# Patient Record
Sex: Female | Born: 1935 | Race: White | Hispanic: No | Smoking: Former smoker
Health system: Southern US, Community
[De-identification: ages and names within clinical notes are randomized; demographics above are authoritative.]

## PROBLEM LIST (undated history)

## (undated) DIAGNOSIS — I429 Cardiomyopathy, unspecified: Secondary | ICD-10-CM

## (undated) DIAGNOSIS — I10 Essential (primary) hypertension: Secondary | ICD-10-CM

## (undated) DIAGNOSIS — C50919 Malignant neoplasm of unspecified site of unspecified female breast: Secondary | ICD-10-CM

## (undated) DIAGNOSIS — I219 Acute myocardial infarction, unspecified: Secondary | ICD-10-CM

## (undated) DIAGNOSIS — F419 Anxiety disorder, unspecified: Secondary | ICD-10-CM

## (undated) DIAGNOSIS — E7800 Pure hypercholesterolemia, unspecified: Secondary | ICD-10-CM

## (undated) DIAGNOSIS — E785 Hyperlipidemia, unspecified: Secondary | ICD-10-CM

## (undated) DIAGNOSIS — Z800 Family history of malignant neoplasm of digestive organs: Secondary | ICD-10-CM

## (undated) DIAGNOSIS — I2510 Atherosclerotic heart disease of native coronary artery without angina pectoris: Secondary | ICD-10-CM

## (undated) DIAGNOSIS — M1990 Unspecified osteoarthritis, unspecified site: Secondary | ICD-10-CM

## (undated) DIAGNOSIS — Z8041 Family history of malignant neoplasm of ovary: Secondary | ICD-10-CM

## (undated) DIAGNOSIS — C4490 Unspecified malignant neoplasm of skin, unspecified: Secondary | ICD-10-CM

## (undated) DIAGNOSIS — C801 Malignant (primary) neoplasm, unspecified: Secondary | ICD-10-CM

## (undated) HISTORY — DX: Cardiomyopathy, unspecified: I42.9

## (undated) HISTORY — DX: Malignant neoplasm of unspecified site of unspecified female breast: C50.919

## (undated) HISTORY — DX: Acute myocardial infarction, unspecified: I21.9

## (undated) HISTORY — PX: MASTECTOMY: SHX3

## (undated) HISTORY — DX: Atherosclerotic heart disease of native coronary artery without angina pectoris: I25.10

## (undated) HISTORY — PX: ABDOMINAL HYSTERECTOMY: SHX81

## (undated) HISTORY — DX: Unspecified malignant neoplasm of skin, unspecified: C44.90

## (undated) HISTORY — DX: Family history of malignant neoplasm of digestive organs: Z80.0

## (undated) HISTORY — DX: Family history of malignant neoplasm of ovary: Z80.41

---

## 2000-03-01 ENCOUNTER — Encounter: Payer: Self-pay | Admitting: Hematology & Oncology

## 2000-03-01 ENCOUNTER — Encounter: Admission: RE | Admit: 2000-03-01 | Discharge: 2000-03-01 | Payer: Self-pay | Admitting: Hematology & Oncology

## 2000-03-10 ENCOUNTER — Encounter: Payer: Self-pay | Admitting: Hematology & Oncology

## 2000-03-10 ENCOUNTER — Ambulatory Visit (HOSPITAL_COMMUNITY): Admission: RE | Admit: 2000-03-10 | Discharge: 2000-03-10 | Payer: Self-pay | Admitting: Hematology & Oncology

## 2000-05-24 ENCOUNTER — Encounter: Payer: Self-pay | Admitting: Family Medicine

## 2000-05-24 ENCOUNTER — Ambulatory Visit (HOSPITAL_COMMUNITY): Admission: RE | Admit: 2000-05-24 | Discharge: 2000-05-24 | Payer: Self-pay | Admitting: Family Medicine

## 2001-05-28 ENCOUNTER — Ambulatory Visit (HOSPITAL_COMMUNITY): Admission: RE | Admit: 2001-05-28 | Discharge: 2001-05-28 | Payer: Self-pay | Admitting: Family Medicine

## 2001-05-28 ENCOUNTER — Encounter: Payer: Self-pay | Admitting: Family Medicine

## 2002-05-29 ENCOUNTER — Ambulatory Visit (HOSPITAL_COMMUNITY): Admission: RE | Admit: 2002-05-29 | Discharge: 2002-05-29 | Payer: Self-pay | Admitting: Family Medicine

## 2002-05-29 ENCOUNTER — Encounter: Payer: Self-pay | Admitting: Family Medicine

## 2002-12-18 ENCOUNTER — Observation Stay (HOSPITAL_COMMUNITY): Admission: EM | Admit: 2002-12-18 | Discharge: 2002-12-19 | Payer: Self-pay | Admitting: Emergency Medicine

## 2003-06-30 ENCOUNTER — Ambulatory Visit (HOSPITAL_COMMUNITY): Admission: RE | Admit: 2003-06-30 | Discharge: 2003-06-30 | Payer: Self-pay | Admitting: Hematology & Oncology

## 2004-02-12 ENCOUNTER — Ambulatory Visit: Payer: Self-pay | Admitting: Internal Medicine

## 2004-02-12 ENCOUNTER — Ambulatory Visit (HOSPITAL_COMMUNITY): Admission: RE | Admit: 2004-02-12 | Discharge: 2004-02-12 | Payer: Self-pay | Admitting: Internal Medicine

## 2004-06-30 ENCOUNTER — Ambulatory Visit (HOSPITAL_COMMUNITY): Admission: RE | Admit: 2004-06-30 | Discharge: 2004-06-30 | Payer: Self-pay | Admitting: Family Medicine

## 2004-07-08 ENCOUNTER — Ambulatory Visit: Payer: Self-pay | Admitting: Hematology & Oncology

## 2004-07-13 ENCOUNTER — Encounter: Admission: RE | Admit: 2004-07-13 | Discharge: 2004-07-13 | Payer: Self-pay | Admitting: Family Medicine

## 2004-08-10 ENCOUNTER — Encounter: Admission: RE | Admit: 2004-08-10 | Discharge: 2004-08-10 | Payer: Self-pay | Admitting: Family Medicine

## 2005-02-08 ENCOUNTER — Encounter: Admission: RE | Admit: 2005-02-08 | Discharge: 2005-02-08 | Payer: Self-pay | Admitting: Family Medicine

## 2005-06-29 ENCOUNTER — Ambulatory Visit: Payer: Self-pay | Admitting: Hematology & Oncology

## 2005-07-07 LAB — COMPREHENSIVE METABOLIC PANEL
CO2: 28 mEq/L (ref 19–32)
Creatinine, Ser: 0.82 mg/dL (ref 0.40–1.20)
Glucose, Bld: 90 mg/dL (ref 70–99)
Total Bilirubin: 0.5 mg/dL (ref 0.3–1.2)
Total Protein: 6.5 g/dL (ref 6.0–8.3)

## 2005-07-07 LAB — CBC WITH DIFFERENTIAL/PLATELET
Basophils Absolute: 0.1 10*3/uL (ref 0.0–0.1)
Eosinophils Absolute: 0.2 10*3/uL (ref 0.0–0.5)
HCT: 38.9 % (ref 34.8–46.6)
LYMPH%: 34.3 % (ref 14.0–48.0)
MONO#: 0.5 10*3/uL (ref 0.1–0.9)
NEUT#: 3.1 10*3/uL (ref 1.5–6.5)
NEUT%: 53 % (ref 39.6–76.8)
Platelets: 259 10*3/uL (ref 145–400)
WBC: 5.9 10*3/uL (ref 3.9–10.0)

## 2005-07-08 ENCOUNTER — Ambulatory Visit (HOSPITAL_COMMUNITY): Admission: RE | Admit: 2005-07-08 | Discharge: 2005-07-08 | Payer: Self-pay | Admitting: Family Medicine

## 2005-08-11 ENCOUNTER — Encounter: Admission: RE | Admit: 2005-08-11 | Discharge: 2005-08-11 | Payer: Self-pay | Admitting: Family Medicine

## 2005-10-10 ENCOUNTER — Emergency Department (HOSPITAL_COMMUNITY): Admission: EM | Admit: 2005-10-10 | Discharge: 2005-10-10 | Payer: Self-pay | Admitting: Emergency Medicine

## 2006-07-04 ENCOUNTER — Ambulatory Visit: Payer: Self-pay | Admitting: Hematology & Oncology

## 2006-07-06 LAB — COMPREHENSIVE METABOLIC PANEL
Albumin: 4 g/dL (ref 3.5–5.2)
CO2: 27 mEq/L (ref 19–32)
Calcium: 9.1 mg/dL (ref 8.4–10.5)
Chloride: 107 mEq/L (ref 96–112)
Glucose, Bld: 79 mg/dL (ref 70–99)
Potassium: 3.8 mEq/L (ref 3.5–5.3)
Sodium: 142 mEq/L (ref 135–145)
Total Bilirubin: 0.4 mg/dL (ref 0.3–1.2)
Total Protein: 6.6 g/dL (ref 6.0–8.3)

## 2006-07-06 LAB — CBC WITH DIFFERENTIAL/PLATELET
Eosinophils Absolute: 0.1 10*3/uL (ref 0.0–0.5)
HCT: 38.2 % (ref 34.8–46.6)
LYMPH%: 26.6 % (ref 14.0–48.0)
MONO#: 0.6 10*3/uL (ref 0.1–0.9)
NEUT#: 5 10*3/uL (ref 1.5–6.5)
Platelets: 272 10*3/uL (ref 145–400)
RBC: 4.45 10*6/uL (ref 3.70–5.32)
WBC: 7.9 10*3/uL (ref 3.9–10.0)

## 2006-08-14 ENCOUNTER — Ambulatory Visit (HOSPITAL_COMMUNITY): Admission: RE | Admit: 2006-08-14 | Discharge: 2006-08-14 | Payer: Self-pay | Admitting: Family Medicine

## 2007-07-31 ENCOUNTER — Ambulatory Visit: Payer: Self-pay | Admitting: Hematology & Oncology

## 2007-08-06 LAB — COMPREHENSIVE METABOLIC PANEL
CO2: 25 mEq/L (ref 19–32)
Creatinine, Ser: 0.85 mg/dL (ref 0.40–1.20)
Glucose, Bld: 107 mg/dL — ABNORMAL HIGH (ref 70–99)
Total Bilirubin: 0.5 mg/dL (ref 0.3–1.2)

## 2007-08-06 LAB — CBC WITH DIFFERENTIAL (CANCER CENTER ONLY)
Eosinophils Absolute: 0.1 10*3/uL (ref 0.0–0.5)
HCT: 43.2 % (ref 34.8–46.6)
LYMPH#: 2.1 10*3/uL (ref 0.9–3.3)
LYMPH%: 38.3 % (ref 14.0–48.0)
MCV: 89 fL (ref 81–101)
MONO#: 0.4 10*3/uL (ref 0.1–0.9)
NEUT%: 50.7 % (ref 39.6–80.0)
RBC: 4.84 10*6/uL (ref 3.70–5.32)
WBC: 5.5 10*3/uL (ref 3.9–10.0)

## 2007-08-21 ENCOUNTER — Ambulatory Visit (HOSPITAL_COMMUNITY): Admission: RE | Admit: 2007-08-21 | Discharge: 2007-08-21 | Payer: Self-pay | Admitting: Hematology & Oncology

## 2008-08-22 ENCOUNTER — Ambulatory Visit: Payer: Self-pay | Admitting: Hematology & Oncology

## 2008-08-25 LAB — COMPREHENSIVE METABOLIC PANEL
ALT: 14 U/L (ref 0–35)
Albumin: 4.1 g/dL (ref 3.5–5.2)
Alkaline Phosphatase: 66 U/L (ref 39–117)
CO2: 26 mEq/L (ref 19–32)
Potassium: 3.8 mEq/L (ref 3.5–5.3)
Sodium: 143 mEq/L (ref 135–145)
Total Bilirubin: 0.5 mg/dL (ref 0.3–1.2)
Total Protein: 6.4 g/dL (ref 6.0–8.3)

## 2008-08-25 LAB — CBC WITH DIFFERENTIAL (CANCER CENTER ONLY)
BASO%: 0.6 % (ref 0.0–2.0)
Eosinophils Absolute: 0.2 10*3/uL (ref 0.0–0.5)
LYMPH#: 2.1 10*3/uL (ref 0.9–3.3)
MONO#: 0.4 10*3/uL (ref 0.1–0.9)
NEUT#: 2.8 10*3/uL (ref 1.5–6.5)
Platelets: 205 10*3/uL (ref 145–400)
RBC: 4.65 10*6/uL (ref 3.70–5.32)
WBC: 5.5 10*3/uL (ref 3.9–10.0)

## 2008-08-26 ENCOUNTER — Ambulatory Visit (HOSPITAL_COMMUNITY): Admission: RE | Admit: 2008-08-26 | Discharge: 2008-08-26 | Payer: Self-pay | Admitting: Family Medicine

## 2008-08-26 LAB — VITAMIN D 25 HYDROXY (VIT D DEFICIENCY, FRACTURES): Vit D, 25-Hydroxy: 29 ng/mL — ABNORMAL LOW (ref 30–89)

## 2008-09-21 ENCOUNTER — Inpatient Hospital Stay (HOSPITAL_COMMUNITY): Admission: EM | Admit: 2008-09-21 | Discharge: 2008-09-22 | Payer: Self-pay | Admitting: Emergency Medicine

## 2008-10-15 ENCOUNTER — Encounter: Admission: RE | Admit: 2008-10-15 | Discharge: 2008-10-15 | Payer: Self-pay | Admitting: Family Medicine

## 2008-10-28 ENCOUNTER — Encounter: Admission: RE | Admit: 2008-10-28 | Discharge: 2008-10-28 | Payer: Self-pay | Admitting: Family Medicine

## 2009-08-25 ENCOUNTER — Ambulatory Visit: Payer: Self-pay | Admitting: Hematology & Oncology

## 2009-08-31 ENCOUNTER — Ambulatory Visit (HOSPITAL_COMMUNITY): Admission: RE | Admit: 2009-08-31 | Discharge: 2009-08-31 | Payer: Self-pay | Admitting: Hematology & Oncology

## 2009-09-24 ENCOUNTER — Ambulatory Visit: Payer: Self-pay | Admitting: Hematology & Oncology

## 2009-09-28 LAB — COMPREHENSIVE METABOLIC PANEL
CO2: 25 mEq/L (ref 19–32)
Calcium: 9.3 mg/dL (ref 8.4–10.5)
Chloride: 104 mEq/L (ref 96–112)
Glucose, Bld: 100 mg/dL — ABNORMAL HIGH (ref 70–99)
Total Bilirubin: 0.5 mg/dL (ref 0.3–1.2)
Total Protein: 6.5 g/dL (ref 6.0–8.3)

## 2009-09-28 LAB — CBC WITH DIFFERENTIAL (CANCER CENTER ONLY)
BASO#: 0 10*3/uL (ref 0.0–0.2)
BASO%: 0.6 % (ref 0.0–2.0)
LYMPH#: 1.9 10*3/uL (ref 0.9–3.3)
MCHC: 33.6 g/dL (ref 32.0–36.0)
NEUT#: 3 10*3/uL (ref 1.5–6.5)
NEUT%: 53.4 % (ref 39.6–80.0)
Platelets: 214 10*3/uL (ref 145–400)
RBC: 4.67 10*6/uL (ref 3.70–5.32)

## 2010-01-24 ENCOUNTER — Encounter: Payer: Self-pay | Admitting: Family Medicine

## 2010-04-09 LAB — DIFFERENTIAL
Basophils Absolute: 0 10*3/uL (ref 0.0–0.1)
Basophils Absolute: 0.1 10*3/uL (ref 0.0–0.1)
Basophils Absolute: 0.1 10*3/uL (ref 0.0–0.1)
Basophils Relative: 0 % (ref 0–1)
Basophils Relative: 1 % (ref 0–1)
Eosinophils Absolute: 0 10*3/uL (ref 0.0–0.7)
Eosinophils Absolute: 0.2 10*3/uL (ref 0.0–0.7)
Eosinophils Relative: 2 % (ref 0–5)
Eosinophils Relative: 3 % (ref 0–5)
Lymphocytes Relative: 41 % (ref 12–46)
Lymphs Abs: 1.3 10*3/uL (ref 0.7–4.0)
Monocytes Absolute: 0.4 10*3/uL (ref 0.1–1.0)
Monocytes Absolute: 0.5 10*3/uL (ref 0.1–1.0)
Monocytes Absolute: 0.7 10*3/uL (ref 0.1–1.0)
Monocytes Relative: 6 % (ref 3–12)
Neutro Abs: 2.9 10*3/uL (ref 1.7–7.7)
Neutro Abs: 5.4 10*3/uL (ref 1.7–7.7)
Neutrophils Relative %: 45 % (ref 43–77)
Neutrophils Relative %: 72 % (ref 43–77)
Neutrophils Relative %: 73 % (ref 43–77)

## 2010-04-09 LAB — CBC
HCT: 39.4 % (ref 36.0–46.0)
HCT: 40 % (ref 36.0–46.0)
HCT: 41.7 % (ref 36.0–46.0)
Hemoglobin: 14 g/dL (ref 12.0–15.0)
Hemoglobin: 14.3 g/dL (ref 12.0–15.0)
Hemoglobin: 14.8 g/dL (ref 12.0–15.0)
MCHC: 34.2 g/dL (ref 30.0–36.0)
MCHC: 34.8 g/dL (ref 30.0–36.0)
MCV: 89 fL (ref 78.0–100.0)
MCV: 90.6 fL (ref 78.0–100.0)
Platelets: 171 10*3/uL (ref 150–400)
Platelets: 206 10*3/uL (ref 150–400)
Platelets: 207 10*3/uL (ref 150–400)
RBC: 4.49 MIL/uL (ref 3.87–5.11)
RDW: 13.4 % (ref 11.5–15.5)
RDW: 13.4 % (ref 11.5–15.5)
RDW: 13.4 % (ref 11.5–15.5)
RDW: 13.7 % (ref 11.5–15.5)
WBC: 6.4 10*3/uL (ref 4.0–10.5)
WBC: 6.6 10*3/uL (ref 4.0–10.5)
WBC: 7.4 10*3/uL (ref 4.0–10.5)

## 2010-04-09 LAB — BASIC METABOLIC PANEL
Calcium: 9.1 mg/dL (ref 8.4–10.5)
Chloride: 105 mEq/L (ref 96–112)
Glucose, Bld: 145 mg/dL — ABNORMAL HIGH (ref 70–99)
Potassium: 3.5 mEq/L (ref 3.5–5.1)
Sodium: 140 mEq/L (ref 135–145)

## 2010-04-09 LAB — TYPE AND SCREEN: Antibody Screen: NEGATIVE

## 2010-04-09 LAB — POCT CARDIAC MARKERS: Myoglobin, poc: 98.8 ng/mL (ref 12–200)

## 2010-05-21 NOTE — Group Therapy Note (Signed)
NAME:  MARSI, TURVEY NO.:  [id number]   MEDICAL RECORD NO.:  [id number]          PATIENT TYPE:  EMS   LOCATION:  ED                            FACILITY:  [facility]   PHYSICIAN:  Mila Homer. [name], M.D.DATE OF BIRTH:  [birth year]   DATE OF PROCEDURE:  DATE OF DISCHARGE:  10/10/2005                                   PROGRESS NOTE   This 76 year old developed a flipping type of chest discomfort in the left  chest this morning while she was walking in [place] with her sister.  It  cleared spontaneously, but started to bother her more later this evening.  She really had no palpitations.   She does have a history of breast carcinoma status post mastectomy.  She had  similar symptoms about 2 years ago and was felt to have chest wall syndrome.  She has had no nausea or vomiting.  No palpitations.   Her sister, from [place], is with her and notes that she drinks and a lot of  coffee everyday, and does seem to have some heartburn symptoms centrally  although this pain has been in the left.   PHYSICAL EXAMINATION:  VITAL SIGNS:  Her temperature is 96.8, blood pressure  is 192/80, pulse 82, respiratory rate 20 and O2 sat 97% on room air.  GENERAL:  She was oriented and alert in no acute distress at the time I saw  her.  SKIN:  Color was good.  CARDIOVASCULAR:  Her heart had a regular rhythm with a rate of 70.  RESPIRATORY:  The lungs were clear with moving air well.  EXTREMITIES:  There is no edema of the ankles.  CHEST:  Pressure and palpation of the chest wall showed tenderness on  palpation on the left in between the ribs and no tenderness on the right.   Her EKG is normal.  Her cardiac enzymes x2 are normal.  Her CBC and Met-7,  likewise, are normal except for a mild elevation of sugar at 131 in the  evening.  Her BNP was 66.6.   ASSESSMENT:  1. Chest wall syndrome, probably costochondritis.  2. Breast carcinoma status post mastectomy.  3. Postprandial  hyperglycemia.   PLAN:  Ibuprofen 800 mg p.o. now.  If the patient has any further  symptomatology at home, she may come immediately back to the ER for further  workup.  I discussed all of this with her sister, daughter and other family  members.  She is to call our office in the morning for a progress report.      Mila Homer. [name], M.D.  Electronically Signed     SDK/MEDQ  D:  10/10/2005  T:  10/12/2005  Job:  [id number]

## 2010-05-21 NOTE — H&P (Signed)
NAMESHARDE, GOVER NO.:  [id number]   MEDICAL RECORD NO.:  [id number]                   PATIENT TYPE:  OBV   LOCATION:  A216                                 FACILITY:  [facility]   PHYSICIAN:  Mila Homer. [name], M.D.           DATE OF BIRTH:  October 17, 1934   DATE OF ADMISSION:  12/18/2002  DATE OF DISCHARGE:                                HISTORY & PHYSICAL   HISTORY OF PRESENT ILLNESS:  This 76 year old woman developed a crampy left  anterior chest pain starting last night.  It kept on bothering her so she  called the office this morning and we recommended that she be seen in the  [facility] ER.   She had a similar episode about a year-and-a-half ago and was seen in the  office and EKGs were run, and she was followed for four hours there and no  problem since then.   She denies any exertional dyspnea or chest pain.  Generally, she has been  doing well on her antihypertensive which includes atenolol 50 mg daily.  She  has also been on Lipitor 10 mg daily for hypercholesterolemia, EC-ASA 81 mg  daily.   She was last seen in the office mid November.   She has generally been healthy.  About eight years ago developed a right  breast cancer, had a modified radical mastectomy, has been doing well since  then.  She has children who live in town.  Husband died 10-15 years ago of  lung cancer spread to the bone.   Vital signs showed blood pressure initially 184/81, pulse 77, respiratory  rate 24.  Blood pressure dropped to 152/68 and then was 171/69.  She was  oriented and alert, no acute distress, well-developed, well-nourished.  Her  color was good.  The heart had a regular rhythm, rate of 70.  The lungs were  clear throughout.  The abdomen was soft without hepatosplenomegaly or mass  or tenderness.  Careful palpation of the chest wall showed some tenderness  on the right where her right modified radical mastectomy was, but also she  had tenderness in the left  chest about the left mid clavicular line.  This  is similar to the type of pain she had been having prior to coming to the  hospital.  She did not have pain elsewhere on palpation.  There was no edema  of the ankles.   Her admission CBC and cardiac enzymes were negative.  MET-7 showed a  potassium of 3.3.  Her EKG showed a normal sinus rhythm.  There was good R  wave progression across the precordium, no sign of ischemia.   ADMISSION DIAGNOSES:  1. Chest wall syndrome.  2. Essential hypertension.  3. Hypercholesterolemia.  4. Malignancy of the right breast status post modified radical mastectomy of     the right breast.  5. Hypokalemia.   PLAN:  Keep her in the hospital as observation patient  overnight and will  run cardiac enzymes q.8h., repeat an EKG within 24 hours, and discharge home  at that time.  Will treat her hypokalemia with KCl 20 mEq b.i.d. and start  her on prednisone 10 mg b.i.d. as well.  Will follow her BP, have her on a  monitor.  Discussed all this at length with the patient and her daughter.     ___________________________________________                                         Mila Homer. [name], M.D.   SDK/MEDQ  D:  12/18/2002  T:  12/18/2002  Job:  [id number]

## 2010-05-21 NOTE — Op Note (Signed)
Tiffany Sweeney, RAVEN            ACCOUNT NO.:  [id number]   MEDICAL RECORD NO.:  [id number]          PATIENT TYPE:  AMB   LOCATION:  DAY                           FACILITY:  [facility]   PHYSICIAN:  Lionel December, M.D.    DATE OF BIRTH:  10-Sep-1934   DATE OF PROCEDURE:  02/12/2004  DATE OF DISCHARGE:                                 OPERATIVE REPORT   PROCEDURE:  Colonoscopy.   INDICATION:  [name] is a 76 year old Caucasian female who for high risk  screening colonoscopy. Her mother had rectal carcinoma at age 2 or 42.  Family history is positive for various other malignancies and personal  history is for breast carcinoma for which she underwent surgery in '97 and  remains in remission. Procedures were reviewed the patient, and informed  consent was obtained.   PREMEDICATION:  Demerol 50 mg IV Versed 8 mg IV.   FINDINGS:  Procedure performed in endoscopy suite. The patient's vital signs  and O2 saturation were monitored during procedure remained stable. The  patient was placed left lateral position. Rectal examination performed. No  abnormality noted on external or digital exam. Olympus videoscope was placed  in the rectum and advanced under vision into sigmoid colon and beyond.  Preparation was satisfactory. Scope was advanced to the cecum which was  identified by appendiceal orifice, ileocecal valve. There was small polyp to  the left of  the appendiceal orifice which was ablated via cold biopsy. The  right lip of ileocecal valve had scarring and erythema. Pictures were taken  and biopsy was also taken to make sure that this was not an infiltrative  process. As the scope was withdrawn, colonic mucosa was examined for the  second time. There was another small polyp at midsigmoid colon which was  ablated via biopsy. Endoscopically, it appeared to be hyperplastic. The  mucosa of the rest of sigmoid colon and rectum was normal. Scope was  retroflexed to examine anorectal junction, and  small to moderate-sized  hemorrhoids were noted below the dentate line. Endoscope was straightened  and withdrawn. The patient tolerated the procedure well.   FINAL DIAGNOSES:  1.  Two small polyps that were ablated via cold biopsy; one was at cecum and      the second one was at sigmoid colon.  2.  Some scarring and erythema involving ileocecal valve. This area was      biopsied for histology to make sure we are not dealing with infiltrative      process.  3.  External hemorrhoids.   RECOMMENDATIONS:  Standard instructions given. I will be contacting the  patient with biopsy results and further recommendations.      NR/MEDQ  D:  02/12/2004  T:  02/12/2004  Job:  [id number]   cc:   Mila Homer. [name], M.D.  [address] [place], [place] [address]  Fax: [phone]   Rose Phi. Myna Hidalgo, M.D.  [address] [facility]  [place], [place] [address]  Fax: [phone]

## 2010-05-21 NOTE — Discharge Summary (Signed)
NAMEGUILLERMINA, SHAFT NO.:  [id number]   MEDICAL RECORD NO.:  [id number]                   PATIENT TYPE:  OBV   LOCATION:  A216                                 FACILITY:  [facility]   PHYSICIAN:  Mila Homer. [name], M.D.           DATE OF BIRTH:  [birth year]   DATE OF ADMISSION:  12/18/2002  DATE OF DISCHARGE:  12/19/2002                                 DISCHARGE SUMMARY   This 76 year old woman developed some crampy-type left chest pain.  She was  seen in the emergency room on December 18, 2002, found to have a normal EKG  and cardiac enzymes.  Was brought in overnight as an observation status.   Cardiac enzymes x3 checked q.8h. were normal.  Repeat EKG was normal.  The  patient was symptom-free.   In the hospital she was continued on atenolol 50 mg daily, Lipitor 10 mg  daily, aspirin 81 mg daily.  She was also given O2 at 2 L/min.  She was put  on IV of normal saline at [facility] and on telemetry.   Due to her slightly low potassium of 3.1 she was put on KCl 20 mEq b.i.d.,  and for the chest inflammation she was put on prednisone 10  mg b.i.d.  She  was allowed Ambien 5 mg q.h.s. p.r.n. sleep.   Her second day she was symptom-free except she did still have tenderness of  the left anterior chest wall in one small area in the left midclavicular  line and around the left 5th-6th rib anteriorly.   FINAL DIAGNOSES:  1. Chest wall syndrome.  2. Hypokalemia.  3. Right breast cancer, status post modified radical mastectomy more than     five years before.  4. Essential hypertension.  5. Hypercholesterolemia.   She is discharged on:  1. Prednisone 10 mg daily (#10, no refills).  2. Potassium chloride 20 mEq daily (#30 with a couple of refills).  3. Atenolol 50 mg daily.  4. Lipitor 10 mg daily.  5. Aspirin 81 mg daily.   FOLLOW-UP:  In the office in two weeks.                                               Mila Homer. [name], M.D.    SDK/MEDQ  D:   12/19/2002  T:  12/19/2002  Job:  [id number]

## 2010-08-30 ENCOUNTER — Other Ambulatory Visit: Payer: Self-pay | Admitting: Hematology & Oncology

## 2010-08-30 DIAGNOSIS — Z139 Encounter for screening, unspecified: Secondary | ICD-10-CM

## 2010-08-30 DIAGNOSIS — C50919 Malignant neoplasm of unspecified site of unspecified female breast: Secondary | ICD-10-CM

## 2010-09-07 ENCOUNTER — Ambulatory Visit (HOSPITAL_COMMUNITY)
Admission: RE | Admit: 2010-09-07 | Discharge: 2010-09-07 | Disposition: A | Discharge status: Home / Self Care | Payer: Medicare Other | Source: Ambulatory Visit | Attending: Hematology & Oncology | Admitting: Hematology & Oncology

## 2010-09-07 DIAGNOSIS — C50919 Malignant neoplasm of unspecified site of unspecified female breast: Secondary | ICD-10-CM

## 2010-09-07 DIAGNOSIS — Z1231 Encounter for screening mammogram for malignant neoplasm of breast: Secondary | ICD-10-CM | POA: Insufficient documentation

## 2010-09-29 ENCOUNTER — Encounter (HOSPITAL_BASED_OUTPATIENT_CLINIC_OR_DEPARTMENT_OTHER): Payer: Medicare Other | Admitting: Hematology & Oncology

## 2010-09-29 DIAGNOSIS — Z853 Personal history of malignant neoplasm of breast: Secondary | ICD-10-CM

## 2010-09-29 DIAGNOSIS — Z9010 Acquired absence of unspecified breast and nipple: Secondary | ICD-10-CM

## 2011-03-23 ENCOUNTER — Encounter (HOSPITAL_COMMUNITY): Payer: Medicare Other

## 2011-03-28 ENCOUNTER — Ambulatory Visit (HOSPITAL_COMMUNITY): Admission: RE | Admit: 2011-03-28 | Payer: Medicare Other | Source: Ambulatory Visit | Admitting: Ophthalmology

## 2011-03-28 ENCOUNTER — Encounter (HOSPITAL_COMMUNITY): Admission: RE | Payer: Self-pay | Source: Ambulatory Visit

## 2011-03-28 SURGERY — PHACOEMULSIFICATION, CATARACT, WITH IOL INSERTION
Anesthesia: Monitor Anesthesia Care | Site: Eye | Laterality: Left

## 2011-04-04 ENCOUNTER — Other Ambulatory Visit (HOSPITAL_COMMUNITY): Payer: Self-pay | Admitting: Family Medicine

## 2011-04-04 ENCOUNTER — Ambulatory Visit (HOSPITAL_COMMUNITY)
Admission: RE | Admit: 2011-04-04 | Discharge: 2011-04-04 | Disposition: A | Discharge status: Home / Self Care | Payer: Medicare Other | Source: Ambulatory Visit | Attending: Family Medicine | Admitting: Family Medicine

## 2011-04-04 DIAGNOSIS — M545 Low back pain, unspecified: Secondary | ICD-10-CM | POA: Insufficient documentation

## 2011-04-04 DIAGNOSIS — M51379 Other intervertebral disc degeneration, lumbosacral region without mention of lumbar back pain or lower extremity pain: Secondary | ICD-10-CM | POA: Insufficient documentation

## 2011-04-04 DIAGNOSIS — M5137 Other intervertebral disc degeneration, lumbosacral region: Secondary | ICD-10-CM | POA: Insufficient documentation

## 2011-09-12 ENCOUNTER — Other Ambulatory Visit (HOSPITAL_COMMUNITY): Payer: Self-pay | Admitting: Family Medicine

## 2011-09-12 DIAGNOSIS — Z139 Encounter for screening, unspecified: Secondary | ICD-10-CM

## 2011-09-19 ENCOUNTER — Ambulatory Visit (HOSPITAL_COMMUNITY)
Admission: RE | Admit: 2011-09-19 | Discharge: 2011-09-19 | Disposition: A | Discharge status: Home / Self Care | Payer: Medicare Other | Source: Ambulatory Visit | Attending: Family Medicine | Admitting: Family Medicine

## 2011-09-19 ENCOUNTER — Other Ambulatory Visit (HOSPITAL_COMMUNITY): Payer: Self-pay | Admitting: Family Medicine

## 2011-09-19 DIAGNOSIS — Z139 Encounter for screening, unspecified: Secondary | ICD-10-CM

## 2011-09-19 DIAGNOSIS — Z1231 Encounter for screening mammogram for malignant neoplasm of breast: Secondary | ICD-10-CM | POA: Insufficient documentation

## 2011-09-25 ENCOUNTER — Emergency Department (HOSPITAL_COMMUNITY): Payer: Medicare Other

## 2011-09-25 ENCOUNTER — Encounter (HOSPITAL_COMMUNITY): Payer: Self-pay | Admitting: *Deleted

## 2011-09-25 ENCOUNTER — Emergency Department (HOSPITAL_COMMUNITY)
Admission: EM | Admit: 2011-09-25 | Discharge: 2011-09-25 | Disposition: A | Discharge status: Home / Self Care | Payer: Medicare Other | Attending: Emergency Medicine | Admitting: Emergency Medicine

## 2011-09-25 DIAGNOSIS — Z9010 Acquired absence of unspecified breast and nipple: Secondary | ICD-10-CM | POA: Insufficient documentation

## 2011-09-25 DIAGNOSIS — Z853 Personal history of malignant neoplasm of breast: Secondary | ICD-10-CM | POA: Insufficient documentation

## 2011-09-25 DIAGNOSIS — I10 Essential (primary) hypertension: Secondary | ICD-10-CM | POA: Insufficient documentation

## 2011-09-25 DIAGNOSIS — M2669 Other specified disorders of temporomandibular joint: Secondary | ICD-10-CM

## 2011-09-25 HISTORY — DX: Pure hypercholesterolemia, unspecified: E78.00

## 2011-09-25 HISTORY — DX: Malignant (primary) neoplasm, unspecified: C80.1

## 2011-09-25 HISTORY — DX: Essential (primary) hypertension: I10

## 2011-09-25 NOTE — ED Provider Notes (Signed)
History     CSN: [id number]  Arrival date & time 09/25/11  0010   First MD Initiated Contact with Patient 09/25/11 [phone]      Chief Complaint  Patient presents with  . Facial Swelling    (Consider location/radiation/quality/duration/timing/severity/associated sxs/prior treatment) HPI  Tiffany Sweeney is a 77 y.o. female who presents to the Emergency Department complaining of  Right sided jaw popping and swelling to right side of face that occurred just prior to arrival when she opened her mouth. She heard a pop and felt the swelling begin. There has been no associated pain. She has taken no medicine.  PCP Dr. [name]  Past Medical History  Diagnosis Date  . High cholesterol   . Hypertension   . Cancer     breast    Past Surgical History  Procedure Date  . Mastectomy   . Abdominal hysterectomy     History reviewed. No pertinent family history.  History  Substance Use Topics  . Smoking status: Never Smoker   . Smokeless tobacco: Not on file  . Alcohol Use: No    OB History    Grav Para Term Preterm Abortions TAB SAB Ect Mult Living                  Review of Systems  Constitutional: Negative for fever.       10 Systems reviewed and are negative for acute change except as noted in the HPI.  HENT: Positive for facial swelling. Negative for congestion.        Jaw popping  Eyes: Negative for discharge and redness.  Respiratory: Negative for cough and shortness of breath.   Cardiovascular: Negative for chest pain.  Gastrointestinal: Negative for vomiting and abdominal pain.  Musculoskeletal: Negative for back pain.  Skin: Negative for rash.  Neurological: Negative for syncope, numbness and headaches.  Psychiatric/Behavioral:       No behavior change.    Allergies  Review of patient's allergies indicates no known allergies.  Home Medications   Current Outpatient Rx  Name Route Sig Dispense Refill  . ATENOLOL 50 MG PO TABS Oral Take 50 mg by mouth  daily.    . OMEGA-3 FATTY ACIDS 1000 MG PO CAPS Oral Take 2 g by mouth daily.      BP 203/82  Pulse 67  Temp 98 F (36.7 C)  Resp 20  Ht 5\' 4"  (1.626 m)  Wt 164 lb (74.39 kg)  BMI 28.15 kg/m2  SpO2 100%  Physical Exam  Nursing note and vitals reviewed. Constitutional: She appears well-developed and well-nourished.       Awake, alert, nontoxic appearance.  HENT:  Head: Normocephalic and atraumatic.  Right Ear: External ear normal.  Left Ear: External ear normal.  Nose: Nose normal.  Mouth/Throat: Oropharynx is clear and moist.       With opening of mouth beyond 3 finger breadths, she demonstrates alteration of articulation to the TMJ on the right. No frank dislocation.  Eyes: Right eye exhibits no discharge. Left eye exhibits no discharge.  Neck: Neck supple.  Cardiovascular: Normal heart sounds.   Pulmonary/Chest: Effort normal and breath sounds normal. She exhibits no tenderness.  Abdominal: Soft. There is no tenderness. There is no rebound.  Musculoskeletal: She exhibits no tenderness.       Baseline ROM, no obvious new focal weakness.  Neurological:       Mental status and motor strength appears baseline for patient and situation.  Skin: No rash noted.  Psychiatric: She has a normal mood and affect.    ED Course  Procedures (including critical care time)  Labs Reviewed - No data to display Dg Mandible 4 Views  09/25/2011  *RADIOLOGY REPORT*  Clinical Data: Jaw pain  MANDIBLE - 4+ VIEW  Comparison: None.  Findings: No gross fracture is seen.  No air-fluid levels.  IMPRESSION: No gross fracture is seen.   Original Report Authenticated By: Charline Bills, M.D.      No diagnosis found.    MDM  Patient with new onset of right jaw swelling and popping that began tonight. Xrays unremarkable. Suspect early TMJ. Referral to her dentist. Dx testing d/w pt.  Questions answered.  Verb understanding, agreeable to d/c home with outpt f/u.Pt stable in ED with no significant  deterioration in condition.The patient appears reasonably screened and/or stabilized for discharge and I doubt any other medical condition or other [facility] requiring further screening, evaluation, or treatment in the ED at this time prior to discharge.  MDM Reviewed: nursing note and vitals Interpretation: x-ray            Nicoletta Dress. Colon Branch, MD 09/25/11 [phone]

## 2011-09-25 NOTE — ED Notes (Signed)
Pt discharged. Pt stable at time of discharge. pt has no questions regarding discharge at this time. Pt voiced understanding of discharge instructions.  

## 2011-09-25 NOTE — ED Notes (Signed)
Pt opened her mouth and felt the right side of her jaw pop. Pt know has swelling under and behind right ear.

## 2011-10-04 ENCOUNTER — Ambulatory Visit (HOSPITAL_BASED_OUTPATIENT_CLINIC_OR_DEPARTMENT_OTHER): Payer: Medicare Other | Admitting: Hematology & Oncology

## 2011-10-04 VITALS — BP 156/68 | HR 52 | Temp 97.9°F | Resp 18 | Ht 64.0 in | Wt 164.0 lb

## 2011-10-04 DIAGNOSIS — Z853 Personal history of malignant neoplasm of breast: Secondary | ICD-10-CM

## 2011-10-04 DIAGNOSIS — M810 Age-related osteoporosis without current pathological fracture: Secondary | ICD-10-CM

## 2011-10-04 DIAGNOSIS — C50919 Malignant neoplasm of unspecified site of unspecified female breast: Secondary | ICD-10-CM

## 2011-10-04 NOTE — Progress Notes (Signed)
CC:   Tiffany Sweeney. Tiffany Sweeney, M.D. Tiffany Sweeney, M.D.  DIAGNOSIS:  Stage I (T1 N0 M0) infiltrating ductal carcinoma of the right breast, clinical remission x15 years.  CURRENT THERAPY:  Observation.  INTERIM HISTORY:  Tiffany Sweeney comes in for her followup.  She continues to do fairly well.  We see her yearly now.  She is not one who likes to take medications.  She is not on baby aspirin.  She is not on vitamin D. I recommended that she be on both of these.  She has had no problems with cough or shortness of breath.  She has had no infection since we last saw her.  There has been no change in bowel or bladder habits.  She has had no leg swelling.  She has not noted any problems with rashes.  Her last mammogram was done on September 9th.  The mammogram did not show any evidence of malignancy with the left breast.  She has had no headaches.  There has been no double vision or blurred vision.  PHYSICAL EXAMINATION:  This is a well-developed, well-nourished white female in no obvious distress.  Vital signs:  97.9, pulse 52, respiratory rate 18, blood pressure 156/68.  Weight is 164.  Head and neck:  Normocephalic, atraumatic skull.  There are no ocular or oral lesions.  There are no palpable cervical or supraclavicular lymph nodes. Lungs:  Clear bilaterally.  Cardiac:  Regular rate and rhythm with a normal S1 and S2.  There are no murmurs, rubs or bruits.  Abdomen:  Soft with good bowel sounds.  There is no palpable abdominal mass.  No palpable hepatosplenomegaly is noted.  Breasts:  Left breast with no masses, edema or erythema.  There is no left axillary adenopathy.  Right chest wall shows a well-healed mastectomy.  No right chest wall nodules are noted.  There are no right axillary lymph nodes.  Back:  No kyphosis or osteoporotic changes.  Extremities:  No clubbing, cyanosis or edema. Skin:  No rashes, ecchymoses or petechia.  Neurologic:  No focal neurological  deficits.  IMPRESSION:  Tiffany Sweeney is a 77 year old white female with a past history of stage I infiltrating ductal carcinoma of the right breast. She underwent a mastectomy.  She is now 15 years out.  I really believe that she is cured.  However, we will continue to follow her along.  I will see her back in 1 year.  When we see her back, we will get some lab work on her.   ______________________________ Tiffany Sweeney, M.D. PRE/MEDQ  D:  10/04/2011  T:  10/04/2011  Job:  [id number]

## 2011-10-04 NOTE — Progress Notes (Signed)
This office note has been dictated.

## 2011-11-08 ENCOUNTER — Ambulatory Visit (HOSPITAL_COMMUNITY)
Admission: RE | Admit: 2011-11-08 | Discharge: 2011-11-08 | Disposition: A | Discharge status: Home / Self Care | Payer: Medicare Other | Source: Ambulatory Visit | Attending: Family Medicine | Admitting: Family Medicine

## 2011-11-08 ENCOUNTER — Other Ambulatory Visit (HOSPITAL_COMMUNITY): Payer: Self-pay | Admitting: Family Medicine

## 2011-11-08 DIAGNOSIS — M545 Low back pain, unspecified: Secondary | ICD-10-CM

## 2011-11-08 DIAGNOSIS — M5137 Other intervertebral disc degeneration, lumbosacral region: Secondary | ICD-10-CM | POA: Insufficient documentation

## 2011-11-08 DIAGNOSIS — M51379 Other intervertebral disc degeneration, lumbosacral region without mention of lumbar back pain or lower extremity pain: Secondary | ICD-10-CM | POA: Insufficient documentation

## 2011-11-11 ENCOUNTER — Other Ambulatory Visit (HOSPITAL_COMMUNITY): Payer: Self-pay | Admitting: Family Medicine

## 2011-11-11 DIAGNOSIS — R609 Edema, unspecified: Secondary | ICD-10-CM

## 2011-11-11 DIAGNOSIS — I87009 Postthrombotic syndrome without complications of unspecified extremity: Secondary | ICD-10-CM

## 2011-11-15 ENCOUNTER — Ambulatory Visit (HOSPITAL_COMMUNITY)
Admission: RE | Admit: 2011-11-15 | Discharge: 2011-11-15 | Disposition: A | Discharge status: Home / Self Care | Payer: Medicare Other | Source: Ambulatory Visit | Attending: Family Medicine | Admitting: Family Medicine

## 2011-11-15 DIAGNOSIS — R609 Edema, unspecified: Secondary | ICD-10-CM

## 2011-11-15 DIAGNOSIS — I87009 Postthrombotic syndrome without complications of unspecified extremity: Secondary | ICD-10-CM | POA: Insufficient documentation

## 2011-11-15 DIAGNOSIS — E7800 Pure hypercholesterolemia, unspecified: Secondary | ICD-10-CM | POA: Insufficient documentation

## 2011-11-15 DIAGNOSIS — M79609 Pain in unspecified limb: Secondary | ICD-10-CM | POA: Insufficient documentation

## 2011-11-15 DIAGNOSIS — I10 Essential (primary) hypertension: Secondary | ICD-10-CM | POA: Insufficient documentation

## 2012-08-31 ENCOUNTER — Other Ambulatory Visit (HOSPITAL_COMMUNITY): Payer: Self-pay | Admitting: Family Medicine

## 2012-08-31 DIAGNOSIS — Z09 Encounter for follow-up examination after completed treatment for conditions other than malignant neoplasm: Secondary | ICD-10-CM

## 2012-09-25 ENCOUNTER — Ambulatory Visit (HOSPITAL_COMMUNITY)
Admission: RE | Admit: 2012-09-25 | Discharge: 2012-09-25 | Disposition: A | Discharge status: Home / Self Care | Payer: Medicare Other | Source: Ambulatory Visit | Attending: Family Medicine | Admitting: Family Medicine

## 2012-09-25 ENCOUNTER — Other Ambulatory Visit (HOSPITAL_COMMUNITY): Payer: Self-pay | Admitting: Family Medicine

## 2012-09-25 DIAGNOSIS — Z09 Encounter for follow-up examination after completed treatment for conditions other than malignant neoplasm: Secondary | ICD-10-CM

## 2012-09-25 DIAGNOSIS — Z1231 Encounter for screening mammogram for malignant neoplasm of breast: Secondary | ICD-10-CM | POA: Insufficient documentation

## 2012-10-03 ENCOUNTER — Other Ambulatory Visit: Payer: Medicare Other | Admitting: Lab

## 2012-10-03 ENCOUNTER — Ambulatory Visit (HOSPITAL_BASED_OUTPATIENT_CLINIC_OR_DEPARTMENT_OTHER): Payer: Medicare Other | Admitting: Hematology & Oncology

## 2012-10-03 VITALS — BP 150/50 | HR 59 | Temp 98.2°F | Resp 16 | Ht 64.0 in | Wt 163.0 lb

## 2012-10-03 DIAGNOSIS — C50919 Malignant neoplasm of unspecified site of unspecified female breast: Secondary | ICD-10-CM

## 2012-10-03 DIAGNOSIS — M810 Age-related osteoporosis without current pathological fracture: Secondary | ICD-10-CM

## 2012-10-03 DIAGNOSIS — C50911 Malignant neoplasm of unspecified site of right female breast: Secondary | ICD-10-CM

## 2012-10-03 DIAGNOSIS — Z853 Personal history of malignant neoplasm of breast: Secondary | ICD-10-CM

## 2012-10-03 NOTE — Progress Notes (Signed)
This office note has been dictated.

## 2012-10-04 NOTE — Progress Notes (Signed)
CC:   Tiffany Sweeney. Tiffany Sweeney, M.D.  DIAGNOSIS:  Stage I (T1 N0 M0) infiltrating ductal carcinoma of the right breast.  CURRENT THERAPY:  Observation.  INTERIM HISTORY:  Tiffany Sweeney comes in for followup.  We see her yearly. As always, we talked about what is going on over in [place].  She was very happy about the [employer] win by the [place].  She was not happy about [place]'s results in the World Cup.  Overall, her health has been doing well.  She has had no problems with fevers, sweats, or chills.  She got through last year without any infections.  She had no problems with bony pain.  She has had no arthritic issues. There has been no change in bowel or bladder habits.  He has had no cough or shortness of breath.  There has been no rashes.  She has had no leg swelling.  He has had no nausea or vomiting.  There has been no visual issues.  She has had no headaches.  We did go ahead and repeat her mammogram.  This was done a couple days ago.  Mammogram was negative for any suspicious calcifications.  PHYSICAL EXAMINATION:  General:  This is a well-developed, well- nourished white female in no obvious distress.  Vital signs: Temperature of 98.2, pulse 59, respiratory rate 16, blood pressure 150/50.  Weight is 163 pounds. Head and neck:  Normocephalic, atraumatic skull.  There are no ocular or oral lesions.  There are no palpable cervical or supraclavicular lymph nodes.  Lungs:  Clear to percussion and auscultation bilaterally. Cardiac:  Regular rate and rhythm with a normal S1 and S2.  There are no murmurs, rubs or bruits.  Abdomen:  Soft.  She has good bowel sounds. There is no fluid wave.  There is no palpable hepatosplenomegaly. Breasts:  Shows left breast with no masses, edema or erythema.  There is no left axillary adenopathy.  Right chest wall shows well-healed mastectomy.  No right chest wall nodules are noted.  There is no erythema or warmth on the right anterior chest  wall.  There is no right axillary adenopathy.  Back:  Shows no kyphosis or osteoporotic changes. She has no tenderness over the spine, ribs, or hips.  Extremities:  Show no clubbing, cyanosis or edema.  No lymphedema is noted in the right arm.  LABORATORY STUDIES:  Were not done this visit.  IMPRESSION:  Tiffany Sweeney is a very charming 78 year old white female. She certainly looks a lot younger than this.  She had stage I breast cancer 16 years ago.  She had a mastectomy.  She is cured from my point of view.  However, she still likes to come back to see [place] just for "peace of mind."  We will plan to get her back in 1 year.    ______________________________ Josph Macho, M.D. PRE/MEDQ  D:  10/03/2012  T:  10/04/2012  Job:  [id number]

## 2012-11-12 ENCOUNTER — Ambulatory Visit (HOSPITAL_COMMUNITY)
Admission: RE | Admit: 2012-11-12 | Discharge: 2012-11-12 | Disposition: A | Discharge status: Home / Self Care | Payer: Medicare Other | Source: Ambulatory Visit | Attending: Family Medicine | Admitting: Family Medicine

## 2012-11-12 DIAGNOSIS — M545 Low back pain, unspecified: Secondary | ICD-10-CM | POA: Insufficient documentation

## 2012-11-12 DIAGNOSIS — E7800 Pure hypercholesterolemia, unspecified: Secondary | ICD-10-CM | POA: Insufficient documentation

## 2012-11-12 DIAGNOSIS — IMO0001 Reserved for inherently not codable concepts without codable children: Secondary | ICD-10-CM | POA: Insufficient documentation

## 2012-11-12 DIAGNOSIS — R269 Unspecified abnormalities of gait and mobility: Secondary | ICD-10-CM | POA: Insufficient documentation

## 2012-11-12 DIAGNOSIS — I10 Essential (primary) hypertension: Secondary | ICD-10-CM | POA: Insufficient documentation

## 2012-11-12 NOTE — Evaluation (Signed)
Physical Therapy Evaluation  Patient Details  Name: Tiffany Sweeney MRN: [id number] Date of Birth: 1934/07/01  Today's Date: 11/12/2012 Time: 1110-1157 PT Time Calculation (min): 47 min Charges:  1 evaluation             Visit#: 1 of 10  Re-eval: 12/12/12 Assessment Diagnosis: low back pain Next MD Visit: Dr. Anson [place]  Authorization: Medicare    Authorization Time Period:    Authorization Visit#: 1 of 10   Past Medical History:  Past Medical History  Diagnosis Date  . High cholesterol   . Hypertension   . Cancer     breast   Past Surgical History:  Past Surgical History  Procedure Laterality Date  . Mastectomy    . Abdominal hysterectomy     Subjective Symptoms/Limitations Symptoms: Pt is a 78 year old female referred top PT for low back pain for about 1 year.  she reports that she started a statin medication which weakened her legs and gave pain, especially with transitional movements.  She has difficulty bending down and getting stuff off the ground, stockings on, getting and into and out of the car.  Her c/co is tightness to her low back.  She reports that she is always thinking of the tightness in her back.  When the pain hits it is about a 4-5/10 espcially when she bends forward.  Limitations: Walking How long can you sit comfortably?: no difficulty How long can you stand comfortably?: Constantly thinking about it, no difficulty.  How long can you walk comfortably?: 1 hour Patient Stated Goals: Pt would like to be able to go shopping for a shopping for a few hours. Wants to be more agile.   Pain Assessment Currently in Pain?: Yes Pain Score: 0-No pain (with flexion 4-5/10) Pain Location: Back Pain Orientation: Lower Pain Type: Chronic pain Pain Onset: More than a month ago Pain Frequency: Constant Pain Relieving Factors: muscle relaxors.   Precautions/Restrictions  Precautions Precaution Comments: hx of cancer  Balance Screening Balance  Screen Has the patient fallen in the past 6 months: No Has the patient had a decrease in activity level because of a fear of falling? : Yes Is the patient reluctant to leave their home because of a fear of falling? : No  Prior Functioning  Prior Function Level of Independence: Independent with basic ADLs Driving: Yes Vocation: Retired Marine scientist Requirements: [place] Review Comments: Enjoys cooking, reading, shopping,   Cognition/Observation Observation/Other Assessments Observations: Swelling to Lt leg  Sensation/Coordination/Flexibility/Functional Tests Coordination Gross Motor Movements are Fluid and Coordinated: No Coordination and Movement Description: impaired transverse abdominus and multifidus muscul.ature Flexibility Thomas: Positive Obers: Positive 90/90: Positive Functional Tests Functional Tests: + FABER Functional Tests: FOTO: 46/54  Assessment RLE AROM (degrees) RLE Overall AROM Comments: hip IR: 0 degrees RLE PROM (degrees) RLE Overall PROM Comments: Hip IR: 4 degrees RLE Strength Right Hip Flexion: 3/5 Right Hip Extension: 3+/5 Right Hip ABduction: 4/5 Right Hip ADduction: 3/5 Right Knee Flexion: 4/5 Right Knee Extension: 4/5 LLE AROM (degrees) LLE Overall AROM Comments: Hip IR: 30 degrees LLE Strength Left Hip Flexion: 3+/5 Left Hip Extension: 4/5 Left Hip ABduction: 4/5 Left Hip ADduction: 4/5 Left Knee Flexion: 4/5 Left Knee Extension: 4/5 Lumbar AROM Overall Lumbar AROM Comments: decreased SI movement with all lumbar motions.  Lumbar Flexion: decreased 75% - increased pain Lumbar Extension: decreased 90% Lumbar - Right Side Bend: decreased 50% - pain Lumbar - Left Side Bend: decreased 50% - pain Lumbar - Right Rotation:  decreased 50% Lumbar - Left Rotation: decreased 50% Palpation Palpation: significant tightness to lumbar paraspinals and decreased sacroiliac mobility  Mobility/Balance  Ambulation/Gait Ambulation/Gait: Yes Gait  Pattern: Narrow base of support;Trunk flexed;Decreased trunk rotation Posture/Postural Control Posture/Postural Control: Postural limitations Postural Limitations: Trunk flexion standing in position of comfort: 10 degrees Static Standing Balance Single Leg Stance - Right Leg: 4 Single Leg Stance - Left Leg: 4 Tandem Stance - Right Leg: 6 Tandem Stance - Left Leg: 10 Rhomberg - Eyes Opened: 10 Rhomberg - Eyes Closed: 10   Exercise/Treatments Standing Other Standing Lumbar Exercises: Sjngle leg stance: 1x20 sec Prone  Straight Leg Raise: 10 reps Other Prone Lumbar Exercises: hip IR: Right 10 reps  Physical Therapy Assessment and Plan PT Assessment and Plan Clinical Impression Statement: Pt is a 78 year old female referred to PT for low back pain and tightness with impairments listed below.  At this time pt is mostly limited by her RLE decreased flexibility especially with hip IR and knee flexion.  Educated pt on minimal HEP exercises.  Pt will benefit from skilled therapeutic intervention in order to improve on the following deficits: Abnormal gait;Decreased balance;Decreased coordination;Decreased strength;Pain;Improper spinal/pelvic alignment;Impaired perceived functional ability;Decreased range of motion;Increased fascial restricitons;Increased muscle spasms;Decreased mobility;Improper body mechanics;Impaired tone Rehab Potential: Good PT Frequency: Min 3X/week PT Duration: 6 weeks PT Treatment/Interventions: Gait training;Stair training;Functional mobility training;Therapeutic activities;Therapeutic exercise;Balance training;Patient/family education;Neuromuscular re-education;Manual techniques PT Plan: No modalities hx of cancer.  Add and progress when able: Supine: piriformis stretch, SI stretch, hooklying shoulder flexion, bridges, leg rolls, SLR, LTR.  Prone: knee flexion, hip extension, hip IR.  Seated: heel and toe roll outs, Standing: hip rolls, SLS, squats, heel and toe raises,  knee flexion.  Pt is eager for HEP..  Educate on posture and progress towards core stabilization and therband exercises.     Goals Home Exercise Program Pt/caregiver will Perform Home Exercise Program: Independently;For increased strengthening;For increased ROM PT Goal: Perform Home Exercise Program - Progress: Goal set today PT Short Term Goals Time to Complete Short Term Goals: 3 weeks PT Short Term Goal 1: Pt wil improve Rt hip IR AROM to 10 degrees to improve pelvic rotation with gait.  PT Short Term Goal 2: Pt will report a decrease in muscle stiffness by 50% for improved QOL.  PT Short Term Goal 3: Pt will be educated on proper sitting and standing posture.  PT Short Term Goal 4: Pt will improve trunk and LE flexibility in order to stand in neutral postion to decrease pain to her low back.  PT Short Term Goal 5: Pt will improve her proprioceptive awareness and demonstrate Lt and Rt SLS x20 seconds on solid surface to decrease falls.  PT Long Term Goals Time to Complete Long Term Goals:  (6 weeks. ) PT Long Term Goal 1: Pt will improve her postural strength to [facility] in order to maintain a neutral spine while walking for 10 minutes to decrease risk of secondary injuries.  PT Long Term Goal 2: Pt will improve her lumbar flexibility and LE flexibility to WNL in order to perform lumbar AROM to WNL for greater ease with picking up an object from the floor.  Long Term Goal 3: Pt will improve her FOTO to Status greater than 60% and limaitions less than 40% for improved QOL.  Long Term Goal 4: Pt will improve her hip AROM to [facility] in order to ambulate with normalized gait mechanics.  PT Long Term Goal 5: Pt will improve her BLE  strength to [facility] In order to ambualte for up to 2 hours to be able to shop with greater ease.   Problem List Patient Active Problem List   Diagnosis Date Noted  . Low back pain 11/12/2012    PT - End of Session Equipment Utilized During Treatment: Gait belt Activity  Tolerance: Patient tolerated treatment well General Behavior During Therapy: [facility] for tasks assessed/performed PT Plan of Care PT Home Exercise Plan: given PT Patient Instructions: imoprtance of posture Consulted and Agree with Plan of Care: Patient  GP Functional Assessment Tool Used: FOTO: 46/54 Functional Limitation: Other PT primary Other PT Primary Current Status (W0981): At least 40 percent but less than 60 percent impaired, limited or restricted Other PT Primary Goal Status (X9147): At least 40 percent but less than 60 percent impaired, limited or restricted  Layla Gramm, MPT, ATC 11/12/2012, 12:32 PM  Physician Documentation Your signature is required to indicate approval of the treatment plan as stated above.  Please sign and either send electronically or make a copy of this report for your files and return this physician signed original.   Please mark one 1.__approve of plan  2. ___approve of plan with the following conditions.   ______________________________                                                          _____________________ Physician Signature                                                                                                             Date

## 2012-11-13 ENCOUNTER — Ambulatory Visit (HOSPITAL_COMMUNITY)
Admission: RE | Admit: 2012-11-13 | Discharge: 2012-11-13 | Disposition: A | Discharge status: Home / Self Care | Payer: Medicare Other | Source: Ambulatory Visit | Attending: Family Medicine | Admitting: Family Medicine

## 2012-11-13 DIAGNOSIS — M545 Low back pain, unspecified: Secondary | ICD-10-CM

## 2012-11-13 NOTE — Progress Notes (Signed)
Physical Therapy Treatment Patient Details  Name: Tiffany Sweeney MRN: [id number] Date of Birth: 11/16/1934  Today's Date: 11/13/2012 Time: 4098-1191 PT Time Calculation (min): 60 min Charge: TE [id number]  Visit#: 2 of 10  Re-eval: 12/12/12    Authorization: Medicare  Authorization Time Period:    Authorization Visit#: 2 of 10   Subjective: Symptoms/Limitations Symptoms: Pt reported pain free today, stated she completed HEP this morning.   Pain Assessment Currently in Pain?: No/denies  Objective:  Exercise/Treatments Stretches Lower Trunk Rotation: 5 reps;10 seconds Piriformis Stretch: 2 reps;30 seconds;Limitations Piriformis Stretch Limitations: figure 4 Standing Heel Raises: 10 reps;Limitations Heel Raises Limitations: Toe raises Functional Squats: 10 reps;Limitations Functional Squats Limitations: PT facilitation Other Standing Lumbar Exercises: Sjngle leg stance: Rt 11" Lt 20" max of 3 Other Standing Lumbar Exercises: hip rolls 15x 5", H/s Curl Bil 10x, Gastroc st 3x 30" Supine Other Supine Lumbar Exercises: hooklying shoulder Other Supine Lumbar Exercises: SI stretch 4x 30" Prone  Straight Leg Raise: 10 reps;Limitations Straight Leg Raises Limitations: Bil LE  Other Prone Lumbar Exercises: hip IR: Right 10 reps Other Prone Lumbar Exercises: h/s curls Bil LE 10x     Physical Therapy Assessment and Plan PT Assessment and Plan Clinical Impression Statement: Began POC for LE strengthening and to improve flexibility to improve foot placement with sance and gait.  Pt presented with increased ER of Rt LE wtih standing, stretches complete to improve flexibilty.  Pt reported calf cramps during supine exercises, instuctred gastroc stretch which resolved cramps.  No reports of pain through session. PT Plan: No modalities hx of cancer.  Add and progress when able: Supine: piriformis stretch, SI stretch, hooklying shoulder flexion, bridges, leg rolls, SLR, LTR.  Prone:  knee flexion, hip extension, hip IR.  Seated: heel and toe roll outs, Standing: hip rolls, SLS, squats, heel and toe raises, knee flexion.  Pt is eager for HEP..  Educate on posture and progress towards core stabilization and therband exercises.     Goals Home Exercise Program Pt/caregiver will Perform Home Exercise Program: Independently;For increased strengthening;For increased ROM PT Short Term Goals Time to Complete Short Term Goals: 3 weeks PT Short Term Goal 1: Pt wil improve Rt hip IR AROM to 10 degrees to improve pelvic rotation with gait.  PT Short Term Goal 1 - Progress: Progressing toward goal PT Short Term Goal 2: Pt will report a decrease in muscle stiffness by 50% for improved QOL.  PT Short Term Goal 3: Pt will be educated on proper sitting and standing posture.  PT Short Term Goal 4: Pt will improve trunk and LE flexibility in order to stand in neutral postion to decrease pain to her low back.  PT Short Term Goal 4 - Progress: Progressing toward goal PT Short Term Goal 5: Pt will improve her proprioceptive awareness and demonstrate Lt and Rt SLS x20 seconds on solid surface to decrease falls.  PT Long Term Goals Time to Complete Long Term Goals:  (6 weeks. ) PT Long Term Goal 1: Pt will improve her postural strength to [facility] in order to maintain a neutral spine while walking for 10 minutes to decrease risk of secondary injuries.  PT Long Term Goal 2: Pt will improve her lumbar flexibility and LE flexibility to WNL in order to perform lumbar AROM to WNL for greater ease with picking up an object from the floor.  PT Long Term Goal 2 - Progress: Progressing toward goal Long Term Goal 3: Pt will improve her FOTO to  Status greater than 60% and limaitions less than 40% for improved QOL.  Long Term Goal 4: Pt will improve her hip AROM to [facility] in order to ambulate with normalized gait mechanics.  PT Long Term Goal 5: Pt will improve her BLE strength to [facility] In order to ambualte for up to 2  hours to be able to shop with greater ease.   Problem List Patient Active Problem List   Diagnosis Date Noted  . Low back pain 11/12/2012    PT - End of Session Activity Tolerance: Patient tolerated treatment well General Behavior During Therapy: [facility] for tasks assessed/performed  GP    Juel Burrow 11/13/2012, 11:34 AM

## 2012-11-15 ENCOUNTER — Ambulatory Visit (HOSPITAL_COMMUNITY)
Admission: RE | Admit: 2012-11-15 | Discharge: 2012-11-15 | Disposition: A | Discharge status: Home / Self Care | Payer: Medicare Other | Source: Ambulatory Visit | Attending: Family Medicine | Admitting: Family Medicine

## 2012-11-15 NOTE — Progress Notes (Signed)
Physical Therapy Treatment Patient Details  Name: Tiffany Sweeney MRN: [id number] Date of Birth: [birth year]  Today's Date: 11/15/2012 Time: 8295-6213 PT Time Calculation (min): 40 min Charges: Therex x 38'  Visit#: 3 of 10  Re-eval: 12/12/12  Authorization: Medicare  Authorization Visit#: 3 of 10   Subjective: Symptoms/Limitations Symptoms: Pt reports continued HEP compliance. Pain Assessment Currently in Pain?: No/denies  Exercise/Treatments Stretches Lower Trunk Rotation: 5 reps;10 seconds Piriformis Stretch: 1 rep;60 seconds Piriformis Stretch Limitations: figure 4 Standing Heel Raises: 10 reps;Limitations Heel Raises Limitations: Toe raises Functional Squats: 10 reps;Limitations Functional Squats Limitations: PT facilitation Supine Bridge: 10 reps Straight Leg Raise: 10 reps;Limitations Straight Leg Raises Limitations: With manual assistance to keep right hip in neutral rotation Other Supine Lumbar Exercises: SI stretch 2 x 30" Prone  Straight Leg Raise: 10 reps;Limitations Straight Leg Raises Limitations: Bil LE  Other Prone Lumbar Exercises: Manual IR stretch to bilateral hips  Physical Therapy Assessment and Plan PT Assessment and Plan Clinical Impression Statement: Pt requires multimodal cueing to improve form with functional squats. Pt also requires tactile cues to maintain neutral rotation of right hip throughout session. Pt tolerates treatment well and reports 0/10 pain at end of session. PT Plan: No modalities hx of cancer.  Add and progress when able: Supine: piriformis stretch, SI stretch, hooklying shoulder flexion, bridges, leg rolls, SLR, LTR.  Prone: knee flexion, hip extension, hip IR.  Seated: heel and toe roll outs, Standing: hip rolls, SLS, squats, heel and toe raises, knee flexion.  Pt is eager for HEP..  Educate on posture and progress towards core stabilization and therband exercises.     Problem List Patient Active Problem List   Diagnosis  Date Noted  . Low back pain 11/12/2012    PT - End of Session Activity Tolerance: Patient tolerated treatment well General Behavior During Therapy: [facility] for tasks assessed/performed  Seth Bake, PTA  11/15/2012, 11:56 AM

## 2012-11-19 ENCOUNTER — Ambulatory Visit (HOSPITAL_COMMUNITY)
Admission: RE | Admit: 2012-11-19 | Discharge: 2012-11-19 | Disposition: A | Discharge status: Home / Self Care | Payer: Medicare Other | Source: Ambulatory Visit | Attending: Family Medicine | Admitting: Family Medicine

## 2012-11-19 NOTE — Progress Notes (Signed)
Physical Therapy Treatment Patient Details  Name: Tiffany Sweeney MRN: [id number] Date of Birth: 1934/09/19  Today's Date: 11/19/2012 Time: 7829-5621 PT Time Calculation (min): 43 min  Visit#: 4 of 10  Re-eval: 12/12/12 Charges: Manual x 30'([id number]) Therex x 25' (0952-1017)  Authorization: Medicare  Authorization Visit#: 4 of 10   Subjective: Symptoms/Limitations Symptoms: Pt states that she is not having any pain. Just some muscle soreness in her legs from completing her exercises. Pain Assessment Currently in Pain?: No/denies   Exercise/Treatments Aerobic Tread Mill: 5'@1 .2 following MET Supine Bridge: 10 reps;5 seconds Straight Leg Raise: 10 reps;Limitations Straight Leg Raises Limitations: With manual assistance to keep right hip in neutral rotation Sidelying Hip Abduction: 10 reps Prone  Straight Leg Raise: 10 reps;Limitations Straight Leg Raises Limitations: Bil LE  Other Prone Lumbar Exercises: Manual IR stretch to bilateral hips Other Prone Lumbar Exercises: Glute set 10x10"  Manual Therapy Manual Therapy: Other (comment) Other Manual Therapy: MET to correct right outflare followed by pubic clearing  Physical Therapy Assessment and Plan PT Assessment and Plan Clinical Impression Statement: Manual stretching completed to improve hip mobility. MET complete to correct right outflare. Pt displays improved gait mechanics and hip rotation following MET. Pt reports 0/10 pain at end of session. PT Plan: No modalities hx of cancer.  Add and progress when able: Supine: piriformis stretch, SI stretch, hooklying shoulder flexion, bridges, leg rolls, SLR, LTR.  Prone: knee flexion, hip extension, hip IR.  Seated: heel and toe roll outs, Standing: hip rolls, SLS, squats, heel and toe raises, knee flexion.  Pt is eager for HEP..  Educate on posture and progress towards core stabilization and therband exercises.      Problem List Patient Active Problem List   Diagnosis  Date Noted  . Low back pain 11/12/2012    PT - End of Session Activity Tolerance: Patient tolerated treatment well General Behavior During Therapy: [facility] for tasks assessed/performed  Seth Bake, PTA  11/19/2012, 12:16 PM

## 2012-11-20 ENCOUNTER — Ambulatory Visit (HOSPITAL_COMMUNITY)
Admission: RE | Admit: 2012-11-20 | Discharge: 2012-11-20 | Disposition: A | Discharge status: Home / Self Care | Payer: Medicare Other | Source: Ambulatory Visit | Attending: Family Medicine | Admitting: Family Medicine

## 2012-11-20 NOTE — Progress Notes (Signed)
Physical Therapy Treatment Patient Details  Name: Tiffany Sweeney MRN: [id number] Date of Birth: [birth year]  Today's Date: 11/20/2012 Time: 0932-1012 PT Time Calculation (min): 40 min Charges: Manual x 23'(0932-0955) Therex x 15'(0907-1012)   Visit#: 5 of 10  Re-eval: 12/12/12  Authorization: Medicare  Authorization Visit#: 5 of 10   Subjective: Symptoms/Limitations Symptoms: Pt states that she has been trying to walk without turning her foot out. Pain Assessment Currently in Pain?: No/denies  Exercise/Treatments Stretches Lower Trunk Rotation: 5 reps;10 seconds Piriformis Stretch: 2 reps;60 seconds Piriformis Stretch Limitations: figure 4 Aerobic Tread Mill: 8'@1 .2 following MET Supine Ab Set: 10 reps Other Supine Lumbar Exercises: SI stretch 2 x 30" Prone  Other Prone Lumbar Exercises: Manual IR stretch to bilateral hips  Physical Therapy Assessment and Plan PT Assessment and Plan Clinical Impression Statement: Continued with stretching and manual techniques to improve hip alignment. Pt displays improved gait mechanics following stretching and MET. Pt appears to be becoming more aware of right hip rotation with gait. PT Plan: No modalities hx of cancer.  Add and progress when able: Supine: piriformis stretch, SI stretch, hooklying shoulder flexion, bridges, leg rolls, SLR, LTR.  Prone: knee flexion, hip extension, hip IR.  Seated: heel and toe roll outs, Standing: hip rolls, SLS, squats, heel and toe raises, knee flexion.  Pt is eager for HEP..  Educate on posture and progress towards core stabilization and therband exercises.     Problem List Patient Active Problem List   Diagnosis Date Noted  . Low back pain 11/12/2012    PT - End of Session Activity Tolerance: Patient tolerated treatment well General Behavior During Therapy: [facility] for tasks assessed/performed   Seth Bake, PTA  11/20/2012, 10:23 AM

## 2012-11-23 ENCOUNTER — Ambulatory Visit (HOSPITAL_COMMUNITY)
Admission: RE | Admit: 2012-11-23 | Discharge: 2012-11-23 | Disposition: A | Discharge status: Home / Self Care | Payer: Medicare Other | Source: Ambulatory Visit | Attending: Family Medicine | Admitting: Family Medicine

## 2012-11-23 DIAGNOSIS — M545 Low back pain, unspecified: Secondary | ICD-10-CM

## 2012-11-23 NOTE — Progress Notes (Signed)
Physical Therapy Treatment Patient Details  Name: Tiffany Sweeney MRN: [id number] Date of Birth: November 27, 1934  Today's Date: 11/23/2012 Time: 1015-1100 PT Time Calculation (min): 45 min Charges:  TE: [id number] Manual: 1045-1100 Visit#: 6 of 10  Re-eval: 12/12/12    Authorization: Medicare  Authorization Time Period:    Authorization Visit#: 6 of 10   Subjective: Symptoms/Limitations Symptoms: Pt reports that when she starts to iron her foot in the correct poistion, by the end her foot is turned out.  She is aware of it.  Is not able to do as many of the laying down exercises.  Pain Assessment Currently in Pain?: No/denies  Precautions/Restrictions     Exercise/Treatments Aerobic Elliptical: NuStep: 8' flat surface reistance 2 SPM avg: 65 for LE/core strengthening Standing Heel Raises: 15 reps;Limitations Heel Raises Limitations: w/o HHA Side Lunge: 10 reps;Limitations Side Lunge Limitations: BLE Row: Both;10 reps;Theraband Theraband Level (Row): Level 3 (Green) Shoulder Extension: Both;10 reps;Theraband Theraband Level (Shoulder Extension): Level 3 (Green) Shoulder ADduction: Both;10 reps;Theraband Theraband Level (Shoulder Adduction): Level 3 (Green) Other Standing Lumbar Exercises: Hip abduction BLE x15 reps Other Standing Lumbar Exercises: Hip Extension BLE x15 reps Manual Therapy Manual Therapy: Joint mobilization Joint Mobilization: Grade I-III AP to thoracic-lumbar spinous process to decrease stiffness and promote PA mobility with MFR to follow.  Myofascial Release: to lumbar and gluteal region to decrease stiffness with lumbar spine with soft tissue massage after to decrease muscle spasms and fascial restrictions.   Physical Therapy Assessment and Plan PT Assessment and Plan Clinical Impression Statement: added standing activities today that pt is to continue at home in a more functional position. Provided with green theraband and pictures for HEP.  Requires on  demonstration cueing for technique for new exercises. Pt reports 0/10 pain at end of session with a decrease in hip stiffness.  PT Plan: No modalities hx of cancer.  Add stair training next visit if pt can tolerate.     Goals Home Exercise Program Pt/caregiver will Perform Home Exercise Program: Independently;For increased strengthening;For increased ROM PT Goal: Perform Home Exercise Program - Progress: Progressing toward goal PT Short Term Goals Time to Complete Short Term Goals: 3 weeks PT Short Term Goal 1: Pt wil improve Rt hip IR AROM to 10 degrees to improve pelvic rotation with gait.  PT Short Term Goal 1 - Progress: Met PT Short Term Goal 2: Pt will report a decrease in muscle stiffness by 50% for improved QOL.  PT Short Term Goal 2 - Progress: Met PT Short Term Goal 3: Pt will be educated on proper sitting and standing posture.  PT Short Term Goal 3 - Progress: Met PT Short Term Goal 4: Pt will improve trunk and LE flexibility in order to stand in neutral postion to decrease pain to her low back.  PT Short Term Goal 4 - Progress: Progressing toward goal PT Short Term Goal 5: Pt will improve her proprioceptive awareness and demonstrate Lt and Rt SLS x20 seconds on solid surface to decrease falls.  PT Short Term Goal 5 - Progress: Partly met PT Long Term Goals Time to Complete Long Term Goals:  (6 weeks. ) PT Long Term Goal 1: Pt will improve her postural strength to [facility] in order to maintain a neutral spine while walking for 10 minutes to decrease risk of secondary injuries.  PT Long Term Goal 1 - Progress: Progressing toward goal PT Long Term Goal 2: Pt will improve her lumbar flexibility and LE flexibility to WNL in order  to perform lumbar AROM to WNL for greater ease with picking up an object from the floor.  PT Long Term Goal 2 - Progress: Progressing toward goal Long Term Goal 3: Pt will improve her FOTO to Status greater than 60% and limaitions less than 40% for improved QOL.   Long Term Goal 3 Progress: Progressing toward goal Long Term Goal 4: Pt will improve her hip AROM to [facility] in order to ambulate with normalized gait mechanics.  Long Term Goal 4 Progress: Progressing toward goal PT Long Term Goal 5: Pt will improve her BLE strength to [facility] In order to ambualte for up to 2 hours to be able to shop with greater ease.  Long Term Goal 5 Progress: Progressing toward goal  Problem List Patient Active Problem List   Diagnosis Date Noted  . Low back pain 11/12/2012    PT - End of Session Activity Tolerance: Patient tolerated treatment well General Behavior During Therapy: [facility] for tasks assessed/performed PT Plan of Care PT Patient Instructions: discussed heat vs. ice, importance of motbility and her HEP Consulted and Agree with Plan of Care: Patient  GP    Jayvyn Haselton, MPT, ATC 11/23/2012, 11:58 AM

## 2012-11-26 ENCOUNTER — Ambulatory Visit (HOSPITAL_COMMUNITY)
Admission: RE | Admit: 2012-11-26 | Discharge: 2012-11-26 | Disposition: A | Discharge status: Home / Self Care | Payer: Medicare Other | Source: Ambulatory Visit | Attending: Family Medicine | Admitting: Family Medicine

## 2012-11-26 DIAGNOSIS — M545 Low back pain, unspecified: Secondary | ICD-10-CM

## 2012-11-26 NOTE — Progress Notes (Signed)
Physical Therapy Treatment Patient Details  Name: Tiffany Sweeney MRN: [id number] Date of Birth: 06-22-1934  Today's Date: 11/26/2012 Time: 0925-1030 PT Time Calculation (min): 65 min Charge: TE [id number], Massage 1020-1030  Visit#: 7 of 10  Re-eval: 12/12/12 Assessment Diagnosis: low back pain Next MD Visit: Dr. Anson [place]  Authorization: Medicare  Authorization Time Period:    Authorization Visit#: 7 of 10   Subjective: Symptoms/Limitations Symptoms: Pt stated she was pain free today, reported most difficulty with balance activities and form with squats at home.   Pain Assessment Currently in Pain?: No/denies  Precautions/Restrictions  Precautions Precaution Comments: hx of cancer  Exercise/Treatments Aerobic Elliptical: Nustep 10' hill level 3 Standing Heel Raises: 15 reps;Limitations Heel Raises Limitations: w/o HHA, Toe raises 15x with 1 finger Functional Squats: 10 reps;Limitations Functional Squats Limitations: PT facilitation Side Lunge: 10 reps;Limitations Side Lunge Limitations: BLE Wall Slides: 10 reps;3 seconds Scapular Retraction: Both;15 reps;Theraband Theraband Level (Scapular Retraction): Level 3 (Green) Row: Both;15 reps;Theraband Theraband Level (Row): Level 3 (Green) Shoulder Extension: Both;15 reps;Theraband Theraband Level (Shoulder Extension): Level 3 (Green) Shoulder ADduction: Both;15 reps;Theraband Theraband Level (Shoulder Adduction): Level 3 (Green) Other Standing Lumbar Exercises: SLS R9", L18" max of 3; Tandem stance 2x30", tandem gait, sidestepping, retro 1RT Other Standing Lumbar Exercises: stair training Bil LE 4in forward, lateral 10x each  Manual Therapy Manual Therapy: Massage Massage: to lumbar and gluteal region to decrease stiffness with MFR f/b soft tissue massage  Physical Therapy Assessment and Plan PT Assessment and Plan Clinical Impression Statement: Added balance activities and began stair training to  address pt.'s most functional diffiiculty tasks and improve functional strengthening.  Pt with good form and technique noted with postural strengthening exercises.  Pt continues to required PT facilitation with squats, pt able to verbalize correct form and technique with teach back method but does continue to require facilitation.   PT Plan: No modalities hx of cancer.  Continue with current POC, progress strenght, balance and posture.      Goals Home Exercise Program Pt/caregiver will Perform Home Exercise Program: Independently;For increased strengthening;For increased ROM PT Short Term Goals Time to Complete Short Term Goals: 3 weeks PT Short Term Goal 1: Pt wil improve Rt hip IR AROM to 10 degrees to improve pelvic rotation with gait.  PT Short Term Goal 2: Pt will report a decrease in muscle stiffness by 50% for improved QOL.  PT Short Term Goal 3: Pt will be educated on proper sitting and standing posture.  PT Short Term Goal 4: Pt will improve trunk and LE flexibility in order to stand in neutral postion to decrease pain to her low back.  PT Short Term Goal 5: Pt will improve her proprioceptive awareness and demonstrate Lt and Rt SLS x20 seconds on solid surface to decrease falls.  PT Short Term Goal 5 - Progress: Progressing toward goal PT Long Term Goals Time to Complete Long Term Goals:  (6 weeks. ) PT Long Term Goal 1: Pt will improve her postural strength to [facility] in order to maintain a neutral spine while walking for 10 minutes to decrease risk of secondary injuries.  PT Long Term Goal 1 - Progress: Progressing toward goal PT Long Term Goal 2: Pt will improve her lumbar flexibility and LE flexibility to WNL in order to perform lumbar AROM to WNL for greater ease with picking up an object from the floor.  Long Term Goal 3: Pt will improve her FOTO to Status greater than 60% and limaitions less  than 40% for improved QOL.  Long Term Goal 4: Pt will improve her hip AROM to [facility] in order to  ambulate with normalized gait mechanics.  PT Long Term Goal 5: Pt will improve her BLE strength to [facility] In order to ambualte for up to 2 hours to be able to shop with greater ease.  Long Term Goal 5 Progress: Progressing toward goal  Problem List Patient Active Problem List   Diagnosis Date Noted  . Low back pain 11/12/2012    PT - End of Session Activity Tolerance: Patient tolerated treatment well General Behavior During Therapy: [facility] for tasks assessed/performed  GP    Juel Burrow 11/26/2012, 10:38 AM

## 2012-11-27 ENCOUNTER — Ambulatory Visit (HOSPITAL_COMMUNITY)
Admission: RE | Admit: 2012-11-27 | Discharge: 2012-11-27 | Disposition: A | Discharge status: Home / Self Care | Payer: Medicare Other | Source: Ambulatory Visit | Attending: Family Medicine | Admitting: Family Medicine

## 2012-11-27 NOTE — Progress Notes (Signed)
Physical Therapy Treatment Patient Details  Name: Tiffany Sweeney MRN: [id number] Date of Birth: February 10, 1934  Today's Date: 11/27/2012 Time: 1191-4782 PT Time Calculation (min): 45 min Charges: Manual x [phone]) Gait x [phone]) Therex x 52'(8413-2440)    Visit#: 8 of 10  Re-eval: 12/12/12  Authorization: Medicare  Authorization Visit#: 8 of 10   Subjective: Symptoms/Limitations Symptoms: Pt reports continued HEP compliance. Pain Assessment Currently in Pain?: No/denies   Exercise/Treatments Aerobic Elliptical: Nustep 10' hill level 3 with cueing to avoid right hip external rotation Tread Mill: 8'@1 .2 following MET with cueing for posture and avoiding ER of right hip Standing Functional Squats: 10 reps;Limitations Functional Squats Limitations: PT facilitation Other Standing Lumbar Exercises: Tandem gait, sidestepping 2RT Seated Sit to Stand: 5 reps;Limitations Sit to Stand Limitations: Without UE assistance Supine Other Supine Lumbar Exercises: Abduction isometric with ball 10x10"  Physical Therapy Assessment and Plan PT Assessment and Plan Clinical Impression Statement: Completed MET to correct right out flare with improved gait mechanics following. Pt requires vc's throughout session to avoid external rotation of right hip. Pt continues to require multimodal cueing to complete functional squats with correct form. PT Plan: No modalities hx of cancer.  Continue with current POC, progress strenght, balance and posture.       Problem List Patient Active Problem List   Diagnosis Date Noted  . Low back pain 11/12/2012    PT - End of Session Activity Tolerance: Patient tolerated treatment well General Behavior During Therapy: [facility] for tasks assessed/performed   Seth Bake, PTA  11/27/2012, 11:05 AM

## 2012-12-03 ENCOUNTER — Ambulatory Visit (HOSPITAL_COMMUNITY): Payer: Medicare Other | Admitting: *Deleted

## 2012-12-04 ENCOUNTER — Ambulatory Visit (HOSPITAL_COMMUNITY)
Admission: RE | Admit: 2012-12-04 | Discharge: 2012-12-04 | Disposition: A | Discharge status: Home / Self Care | Payer: Medicare Other | Source: Ambulatory Visit | Attending: Family Medicine | Admitting: Family Medicine

## 2012-12-04 DIAGNOSIS — I10 Essential (primary) hypertension: Secondary | ICD-10-CM | POA: Insufficient documentation

## 2012-12-04 DIAGNOSIS — E7800 Pure hypercholesterolemia, unspecified: Secondary | ICD-10-CM | POA: Insufficient documentation

## 2012-12-04 DIAGNOSIS — R269 Unspecified abnormalities of gait and mobility: Secondary | ICD-10-CM | POA: Insufficient documentation

## 2012-12-04 DIAGNOSIS — M545 Low back pain, unspecified: Secondary | ICD-10-CM | POA: Insufficient documentation

## 2012-12-04 DIAGNOSIS — IMO0001 Reserved for inherently not codable concepts without codable children: Secondary | ICD-10-CM | POA: Insufficient documentation

## 2012-12-04 NOTE — Progress Notes (Signed)
Physical Therapy Treatment Patient Details  Name: Tiffany Sweeney MRN: [id number] Date of Birth: 12-25-1934  Today's Date: 12/04/2012 Time: 4098-1191 PT Time Calculation (min): 51 min Charges: Manual x 12'(0805-0817) Therex x 47'(8295-6213)   Visit#: 9 of 10  Re-eval: 12/12/12  Authorization: Medicare  Authorization Visit#: 9 of 10   Subjective: Symptoms/Limitations Symptoms: Pt states that the exercises are helping. Pain Assessment Currently in Pain?: No/denies   Exercise/Treatments Aerobic Elliptical: Nustep 10' hill #3 level 2 with cueing to avoid right hip external rotation Tread Mill: 5'@1 .2 following MET with cueing for posture and avoiding ER of right hip Standing Functional Squats: 10 reps Other Standing Lumbar Exercises: Tandem gait, sidestepping 2RT Other Standing Lumbar Exercises: Rocker board R/L x 2' keeping bilateral hips in neutral rotation  Manual Therapy Other Manual Therapy: MET to correct right outflare and right anterior rotation.  Physical Therapy Assessment and Plan PT Assessment and Plan Clinical Impression Statement: Pt continues to display improvements in gait mechanics. MET completed to correct right outflare and anterior rotation. Pt displays improved form with functional squats. Began rocker board with focus on neutral hip rotation. Pt requires vc's to avoid external rotation of right hip with NuStep. PT Plan: Reassess next session.    Problem List Patient Active Problem List   Diagnosis Date Noted  . Low back pain 11/12/2012    PT - End of Session Activity Tolerance: Patient tolerated treatment well General Behavior During Therapy: [facility] for tasks assessed/performed  Seth Bake, PTA  12/04/2012, 9:00 AM

## 2012-12-06 ENCOUNTER — Ambulatory Visit (HOSPITAL_COMMUNITY)
Admission: RE | Admit: 2012-12-06 | Discharge: 2012-12-06 | Disposition: A | Discharge status: Home / Self Care | Payer: Medicare Other | Source: Ambulatory Visit | Attending: Family Medicine | Admitting: Family Medicine

## 2012-12-06 DIAGNOSIS — M545 Low back pain, unspecified: Secondary | ICD-10-CM

## 2012-12-06 NOTE — Progress Notes (Signed)
Physical Therapy Re-Evaluation  Patient Details  Name: Tiffany Sweeney MRN: [id number] Date of Birth: [birth year]  Today's Date: 12/06/2012 Time: 0930-1015 PT Time Calculation (min): 45 min Charges: TE: 930-945 MMT/ROM: [phone] Self Care: 1000-1015             Visit#: 10 of 14  Re-eval: 12/20/12 Assessment Diagnosis: low back pain Next MD Visit: Dr. Anson [place]  Authorization: Medicare    Authorization Time Period:    Authorization Visit#: 10 of 20   Subjective Symptoms/Limitations Symptoms: Pt reports that she continues to have the most difficulty when walking and her balance.  Her back is feeling better.  Pain Assessment Currently in Pain?: No/denies  Precautions/Restrictions  Precautions Precaution Comments: hx of cancer  Cognition/Observation Observation/Other Assessments Observations: Swelling to Lt leg  Sensation/Coordination/Flexibility/Functional Tests Functional Tests Functional Tests: FOTO: 66/34 (was 46/54)  Assessment RLE AROM (degrees) RLE Overall AROM Comments: hip IR: 5 degrees (was 0 degrees) RLE PROM (degrees) RLE Overall PROM Comments: Hip IR: 4 degrees RLE Strength RLE Overall Strength Comments: Hip ER: 3+/5, IR: 4/5 Right Hip Flexion: 3+/5 (was 3/5) Right Hip Extension: 4/5 (was 3+/5) Right Hip ABduction: 5/5 (was 4/5) Right Hip ADduction: 3+/5 (as 3/5) Right Knee Flexion: 5/5 (was 4/5) Right Knee Extension: 5/5 (was 4/5) LLE AROM (degrees) LLE Overall AROM Comments: Hip IR: 35 (was 30 degree) Hip ER: 20 LLE Strength LLE Overall Strength Comments: Hip ER 4/*5, IR: 5/5 Left Hip Flexion: 5/5 (was 3+/5) Left Hip Extension: 4/5 (was 4/5) Left Hip ABduction: 5/5 (was 4/5) Left Hip ADduction: 4/5 (was 4/5) Left Knee Flexion: 5/5 (was 4/5) Left Knee Extension: 5/5 (was 4/5) Lumbar AROM Overall Lumbar AROM Comments: improved SI mobility with all motions.  (was decreased SI movement with all lumbar motions. ) Lumbar Flexion: decreased  50% without pain (was decreased 75% - increased pain) Lumbar Extension: decreased 75% (was decreased 90%) Lumbar - Right Side Bend: decreased 50% -  without pain (was decreased 50% - pain) Lumbar - Left Side Bend: decreased 50% -without pain (was decreased 50% - pain) Lumbar - Right Rotation: WNL (was decreased 50%) Lumbar - Left Rotation: WNL (was decreased 50%)  Mobility/Balance  Ambulation/Gait Ambulation/Gait: Yes Gait Pattern:  (Rt Toe Out) Posture/Postural Control Posture/Postural Control: Postural limitations Postural Limitations: Trunk flexion standing in position of comfort: 10 degrees Static Standing Balance Single Leg Stance - Right Leg: 13 (was 4) Single Leg Stance - Left Leg: 9 (was 4) Tandem Stance - Right Leg: 20 (was 6) Tandem Stance - Left Leg: 10 (was 10) [place]omberg - Eyes Opened:  (was 10) [place]berg - Eyes Closed:  (was 10)   Exercise/Treatments Aerobic Elliptical: Nustep 10' hill #3 level 2 with cueing to avoid right hip external rotation Prone  Straight Leg Raise: 5 reps;Limitations Straight Leg Raises Limitations: BLE Other Prone Lumbar Exercises: Right hip IR: 10 reps  Physical Therapy Assessment and Plan PT Assessment and Plan Clinical Impression Statement: Ms. Torre has attended 10 OP PT visits to address low back pain with following findings: reports decreased pain to low back, has slow and steady progress with her flexibility, lumbar AROM and strength.  At this time pt main concern is her impaired balance.  Willl continue 2x/week x2 weeks to reach remaining goals.  Pt will benefit from skilled therapeutic intervention in order to improve on the following deficits: Abnormal gait;Decreased balance;Decreased coordination;Decreased strength;Pain;Improper spinal/pelvic alignment;Impaired perceived functional ability;Decreased range of motion;Increased fascial restricitons;Increased muscle spasms;Decreased mobility;Improper body mechanics;Impaired tone PT  Frequency: Min  2X/week PT Duration:  (2 weeks) PT Treatment/Interventions: Gait training;Stair training;Functional mobility training;Therapeutic activities;Therapeutic exercise;Balance training;Patient/family education;Neuromuscular re-education;Manual techniques PT Plan: Continue with balance activities (retro gait, tandem gait, heel/toe walking) improve Rt hip IR with prone and seated hip IR strengthening.  Continue with cueing for proper gait mechanics. Update with advanced HEP (gait activities, hip IR, hip flexion strengthening, supine toe roll ins)    Goals Home Exercise Program Pt/caregiver will Perform Home Exercise Program: Independently;For increased strengthening;For increased ROM PT Goal: Perform Home Exercise Program - Progress: Met PT Short Term Goals Time to Complete Short Term Goals: 3 weeks PT Short Term Goal 1: Pt wil improve Rt hip IR AROM to 10 degrees to improve pelvic rotation with gait.  PT Short Term Goal 1 - Progress: Progressing toward goal PT Short Term Goal 2: Pt will report a decrease in muscle stiffness by 50% for improved QOL.  PT Short Term Goal 2 - Progress: Met PT Short Term Goal 3: Pt will be educated on proper sitting and standing posture.  PT Short Term Goal 3 - Progress: Met PT Short Term Goal 4: Pt will improve trunk and LE flexibility in order to stand in neutral postion to decrease pain to her low back.  PT Short Term Goal 4 - Progress: Met PT Short Term Goal 5: Pt will improve her proprioceptive awareness and demonstrate Lt and Rt SLS x20 seconds on solid surface to decrease falls.  PT Short Term Goal 5 - Progress: Progressing toward goal PT Long Term Goals Time to Complete Long Term Goals:  (6 weeks. ) PT Long Term Goal 1: Pt will improve her postural strength to [facility] in order to maintain a neutral spine while walking for 10 minutes to decrease risk of secondary injuries.  PT Long Term Goal 1 - Progress: Met PT Long Term Goal 2: Pt will improve her  lumbar flexibility and LE flexibility to WNL in order to perform lumbar AROM to WNL for greater ease with picking up an object from the floor.  PT Long Term Goal 2 - Progress: Progressing toward goal Long Term Goal 3: Pt will improve her FOTO to Status greater than 60% and limaitions less than 40% for improved QOL.  Long Term Goal 3 Progress: Met Long Term Goal 4: Pt will improve her hip AROM to [facility] in order to ambulate with normalized gait mechanics.  Long Term Goal 4 Progress: Progressing toward goal PT Long Term Goal 5: Pt will improve her BLE strength to [facility] In order to ambualte for up to 2 hours to be able to shop with greater ease.  Long Term Goal 5 Progress: Met  Problem List Patient Active Problem List   Diagnosis Date Noted  . Low back pain 11/12/2012    PT - End of Session Activity Tolerance: Patient tolerated treatment well General Behavior During Therapy: [facility] for tasks assessed/performed PT Plan of Care PT Patient Instructions: discussed FOTO, remaining deficits, improvements, discussed new POC focus.  Consulted and Agree with Plan of Care: Patient  GP Functional Assessment Tool Used: FOTO: 66/34 Functional Limitation: Other PT primary Other PT Primary Current Status (Z6109): At least 40 percent but less than 60 percent impaired, limited or restricted Other PT Primary Goal Status (U0454): At least 40 percent but less than 60 percent impaired, limited or restricted  Ondine Gemme, MPT, ATC 12/06/2012, 10:34 AM  Physician Documentation Your signature is required to indicate approval of the treatment plan as stated above.  Please sign and either  send electronically or make a copy of this report for your files and return this physician signed original.   Please mark one 1.__approve of plan  2. ___approve of plan with the following conditions.   ______________________________                                                          _____________________ Physician Signature                                                                                                              Date

## 2012-12-11 ENCOUNTER — Ambulatory Visit (HOSPITAL_COMMUNITY)
Admission: RE | Admit: 2012-12-11 | Discharge: 2012-12-11 | Disposition: A | Discharge status: Home / Self Care | Payer: Medicare Other | Source: Ambulatory Visit | Attending: Family Medicine | Admitting: Family Medicine

## 2012-12-11 DIAGNOSIS — M545 Low back pain, unspecified: Secondary | ICD-10-CM

## 2012-12-11 NOTE — Progress Notes (Signed)
Physical Therapy Treatment Patient Details  Name: Tiffany Sweeney MRN: [id number] Date of Birth: [birth year]  Today's Date: 12/11/2012 Time: 4098-1191 PT Time Calculation (min): 43 min Charge: Manual [id number], TE 1308-6578  Visit#: 11 of 14  Re-eval: 12/20/12 Assessment Diagnosis: low back pain Next MD Visit: Dr. Anson [place]  Authorization: Medicare  Authorization Time Period:    Authorization Visit#: 11 of 20   Subjective: Symptoms/Limitations Symptoms: Pt stated Rt upper thigh pain 4/10 today. Pain Assessment Currently in Pain?: Yes Pain Score: 4  Pain Location: Hip Pain Orientation: Right  Precautions/Restrictions  Precautions Precaution Comments: hx of cancer  Exercise/Treatments Aerobic Tread Mill: 5'@1 .2 following MET with cueing for posture and avoiding ER of right hip Seated Other Seated Lumbar Exercises: Heel and Tor roll outs 5x 10" Prone  Straight Leg Raise: 10 reps;Limitations Straight Leg Raises Limitations: BLE Other Prone Lumbar Exercises: Right hip IR: 10 reps  Manual Therapy Manual Therapy: Other (comment) Other Manual Therapy: MET for Rt outflare, LT inflare, pubic clearing f/b gait training on TM  Physical Therapy Assessment and Plan PT Assessment and Plan Clinical Impression Statement: Pt wtih significant sacroiliac misalignment, manual [employer] Techniques complete to improve Rt SI outflare and to decrease Lt SI inflare, able to improve alignement by 80% with pain reductuion stated following and improve gait mechanics.  Therex focus on gluteal strengthening and improving internal rotation Bil LE.  Pt given new HEP handout and able to verbalize appropriate technqiue wtih all exercises using teach back method.   PT Plan: Continue with balance activities (retro gait, tandem gait, heel/toe walking) improve Rt hip IR with prone and seated hip IR strengthening.  Continue with cueing for proper gait mechanics. Update with advanced HEP (gait  activities, hip IR, hip flexion strengthening, supine toe roll ins)    Goals Home Exercise Program Pt/caregiver will Perform Home Exercise Program: Independently;For increased strengthening;For increased ROM PT Short Term Goals Time to Complete Short Term Goals: 3 weeks PT Short Term Goal 1: Pt wil improve Rt hip IR AROM to 10 degrees to improve pelvic rotation with gait.  PT Short Term Goal 2: Pt will report a decrease in muscle stiffness by 50% for improved QOL.  PT Short Term Goal 3: Pt will be educated on proper sitting and standing posture.  PT Short Term Goal 4: Pt will improve trunk and LE flexibility in order to stand in neutral postion to decrease pain to her low back.  PT Short Term Goal 5: Pt will improve her proprioceptive awareness and demonstrate Lt and Rt SLS x20 seconds on solid surface to decrease falls.  PT Long Term Goals Time to Complete Long Term Goals:  (6 weeks. ) PT Long Term Goal 1: Pt will improve her postural strength to [facility] in order to maintain a neutral spine while walking for 10 minutes to decrease risk of secondary injuries.  PT Long Term Goal 2: Pt will improve her lumbar flexibility and LE flexibility to WNL in order to perform lumbar AROM to WNL for greater ease with picking up an object from the floor.  Long Term Goal 3: Pt will improve her FOTO to Status greater than 60% and limaitions less than 40% for improved QOL.  Long Term Goal 4: Pt will improve her hip AROM to [facility] in order to ambulate with normalized gait mechanics.  Long Term Goal 4 Progress: Progressing toward goal PT Long Term Goal 5: Pt will improve her BLE strength to [facility] In order to ambualte for  up to 2 hours to be able to shop with greater ease.   Problem List Patient Active Problem List   Diagnosis Date Noted  . Low back pain 11/12/2012    PT - End of Session Activity Tolerance: Patient tolerated treatment well General Behavior During Therapy: [facility] for tasks assessed/performed PT Plan  of Care PT Home Exercise Plan: Advanced HEP given  GP    Juel Burrow 12/11/2012, 2:11 PM

## 2012-12-13 ENCOUNTER — Ambulatory Visit (HOSPITAL_COMMUNITY)
Admission: RE | Admit: 2012-12-13 | Discharge: 2012-12-13 | Disposition: A | Discharge status: Home / Self Care | Payer: Medicare Other | Source: Ambulatory Visit | Attending: Family Medicine | Admitting: Family Medicine

## 2012-12-13 DIAGNOSIS — M545 Low back pain, unspecified: Secondary | ICD-10-CM

## 2012-12-13 NOTE — Progress Notes (Signed)
Physical Therapy Treatment Patient Details  Name: Tiffany Sweeney MRN: [id number] Date of Birth: [birth year]  Today's Date: 12/13/2012 Time: 0935-1005 PT Time Calculation (min): 30 min Charges: Manual: [id number] TE: [phone] Visit#: 12 of 14  Re-eval: 12/20/12 Assessment Diagnosis: low back pain Next MD Visit: Dr. Anson [place]  Authorization: Medicare  Authorization Time Period:    Authorization Visit#: 11 of 20   Subjective: Symptoms/Limitations Symptoms: Pt reports that she has groin pain that was more intense yesterday and feels a little better today.  Feels she may have overdone it with exercises.  reports she does not even think about her back pain anymore.  Pain Assessment Currently in Pain?: Yes Pain Score: 4   Precautions/Restrictions     Exercise/Treatments Standing Other Standing Lumbar Exercises: Side steeping w/toes forward x 2RT, Figure 8 x5 minutes w/PT facilation: Tai Chi movements w/PT facilitaion: single arm x10, arm to arm x5, double arm x5, double arm with leg lift x5 all with hip rotation.   Other Standing Lumbar Exercises: Heel/toe walking 2 RT Supine Other Supine Lumbar Exercises: Hip IR stretch 3x30 sec holds manual  Manual Therapy Massage: Supine with legs elevated to addcutors, entire thigh and anterior hip region w/instruction for diaphramatic breathing. 75% reduction in muscle spasms after.   Physical Therapy Assessment and Plan PT Assessment and Plan Clinical Impression Statement: started session with diaphragmatic breathing and manual techniques to decrease pain to rt adductor and thigh region, pt able to tolerate greater pressure at end of manual techniques.  COntinued with exercises to encourage hip rotation and improve gait mechanics and added tai chi exercises to improve hip function and rotation.  Continues to require PT facilation to improve hip and foot mechanics.   PT Plan: Continue with focus on improving hip mechanics.  Tai Chi  exercises as able to improve mobility and strength through legs.     Goals    Problem List Patient Active Problem List   Diagnosis Date Noted  . Low back pain 11/12/2012    PT - End of Session Activity Tolerance: Patient tolerated treatment well General Behavior During Therapy: [facility] for tasks assessed/performed PT Plan of Care PT Patient Instructions: discussed importance of breathing exercises for relaxation.   GP    Juanpablo Ciresi, MPT, ATC 12/13/2012, 10:39 AM

## 2012-12-17 ENCOUNTER — Ambulatory Visit (HOSPITAL_COMMUNITY)
Admission: RE | Admit: 2012-12-17 | Discharge: 2012-12-17 | Disposition: A | Discharge status: Home / Self Care | Payer: Medicare Other | Source: Ambulatory Visit | Attending: Family Medicine | Admitting: Family Medicine

## 2012-12-17 DIAGNOSIS — M545 Low back pain, unspecified: Secondary | ICD-10-CM

## 2012-12-17 NOTE — Progress Notes (Signed)
Physical Therapy Treatment Patient Details  Name: Tiffany Sweeney MRN: [id number] Date of Birth: May 11, 1934  Today's Date: 12/17/2012 Time: 1018-1102 PT Time Calculation (min): 44 min Charge:TE 1018-1050; Manual 1050-1102  Visit#: 13 of 14  Re-eval: 12/20/12 Assessment Diagnosis: low back pain Next MD Visit: Dr. Anson [place]  Authorization: Medicare  Authorization Time Period:    Authorization Visit#: 12 of 20   Subjective: Symptoms/Limitations Symptoms: Pt stated she was feeling good today, completed her exercises this morning. Pain Assessment Currently in Pain?: No/denies  Precautions/Restrictions  Precautions Precaution Comments: hx of cancer  Exercise/Treatments Standing Other Standing Lumbar Exercises: Side steeping w/toes forward 3 reps with yellow tband with steps of 4, 3, 2, &1; Figure 8 x5 minutes w/PT facilation: Tai Chi movements w/PT facilitaion: single arm x10, arm to arm x5, double arm x5, double arm with leg lift x5 all with hip rotation; tandem and retro gait 2RT Other Standing Lumbar Exercises: Heel/toe walking 2 RT; Gait training with SPC in each hand to improve UE and LE coordination and hip mobilty.    Manual Therapy Massage: supine with legs elevated to addcutors, entire thigh and anterior hip region w/instruction for diaphramatic breathing.   Physical Therapy Assessment and Plan PT Assessment and Plan Clinical Impression Statement: Session focus on improving hip mobility and balance to improve confidence with gait wtih cueing to reduce ER with Rt LE.  Pt required therapist faciliation to improve coordination and increase hip mobility.  Continued with manual techniques to anterior thigh to reduce fascial restrictions and decrease spasms. PT Plan: Re-eval next session.  Continue with focus on improving hip mechanics.  Tai Chi exercises as able to improve mobility and strength through legs.     Goals Home Exercise Program Pt/caregiver will Perform  Home Exercise Program: Independently;For increased strengthening;For increased ROM PT Short Term Goals Time to Complete Short Term Goals: 3 weeks PT Short Term Goal 1: Pt wil improve Rt hip IR AROM to 10 degrees to improve pelvic rotation with gait.  PT Short Term Goal 1 - Progress: Progressing toward goal PT Short Term Goal 2: Pt will report a decrease in muscle stiffness by 50% for improved QOL.  PT Short Term Goal 3: Pt will be educated on proper sitting and standing posture.  PT Short Term Goal 4: Pt will improve trunk and LE flexibility in order to stand in neutral postion to decrease pain to her low back.  PT Short Term Goal 5: Pt will improve her proprioceptive awareness and demonstrate Lt and Rt SLS x20 seconds on solid surface to decrease falls.  PT Long Term Goals Time to Complete Long Term Goals:  (6 weeks. ) PT Long Term Goal 1: Pt will improve her postural strength to [facility] in order to maintain a neutral spine while walking for 10 minutes to decrease risk of secondary injuries.  PT Long Term Goal 2: Pt will improve her lumbar flexibility and LE flexibility to WNL in order to perform lumbar AROM to WNL for greater ease with picking up an object from the floor.  PT Long Term Goal 2 - Progress: Progressing toward goal Long Term Goal 3: Pt will improve her FOTO to Status greater than 60% and limaitions less than 40% for improved QOL.  Long Term Goal 4: Pt will improve her hip AROM to [facility] in order to ambulate with normalized gait mechanics.  Long Term Goal 4 Progress: Progressing toward goal PT Long Term Goal 5: Pt will improve her BLE strength to [facility]  In order to [facility] for up to 2 hours to be able to shop with greater ease.   Problem List Patient Active Problem List   Diagnosis Date Noted  . Low back pain 11/12/2012    PT - End of Session Equipment Utilized During Treatment: Gait belt Activity Tolerance: Patient tolerated treatment well General Behavior During Therapy: [facility] for  tasks assessed/performed  GP    Juel Burrow 12/17/2012, 11:18 AM

## 2012-12-20 ENCOUNTER — Ambulatory Visit (HOSPITAL_COMMUNITY)
Admission: RE | Admit: 2012-12-20 | Discharge: 2012-12-20 | Disposition: A | Discharge status: Home / Self Care | Payer: Medicare Other | Source: Ambulatory Visit | Attending: Physical Therapy | Admitting: Physical Therapy

## 2012-12-20 DIAGNOSIS — M545 Low back pain, unspecified: Secondary | ICD-10-CM

## 2012-12-20 NOTE — Evaluation (Addendum)
Physical Therapy Discharge Summary  Patient Details  Name: Tiffany Sweeney MRN: [id number] Date of Birth: Sep 11, 1934  Today's Date: 12/20/2012 Time: 1015-1100 PT Time Calculation (min): 45 min Charges: 1 MMT Self Care: 1030-1100 Visit#: 14 of 14  Re-eval: 12/20/12 Assessment Diagnosis: low back pain Next MD Visit: Dr. Anson [place]  Authorization: Medicare    Authorization Time Period:    Authorization Visit#: 14 of 20   Subjective Symptoms/Limitations Symptoms: Pt reports that she is happy that she came to therapy.  She is doing her exercises the best she can.  She does not have any pain to her back, continues to have pain to her Rt hip which makes it difficult to don/doff LE shoes and pants.  Pain: Right Hip FACES: 6/10  Precautions/Restrictions  Precautions Precaution Comments: hx of cancer  Sensation/Coordination/Flexibility/Functional Tests Functional Tests Functional Tests: FOTO: 66/34 (was 46/54)  RLE Strength RLE Overall Strength Comments: Hip ER: 5/5 (was 3+/5), IR:5/5 (was  4/5)  Right Hip Flexion: 5/5 (guarded movements and painful(was 3+/5)) Right Hip Extension: 4/5 (was 4/5) Right Hip ABduction: 5/5 (was 5/5) Right Hip ADduction: 4/5 (was 3+/5) Right Knee Flexion: 5/5 Right Knee Extension: 5/5 LLE Strength LLE Overall Strength Comments: Hip ER 5/5 (was 4/5), IR: 5/5 (was 5/5) Left Hip Flexion: 5/5 (was 5/5) Left Hip Extension: 4/5 (was 4/5) Left Hip ABduction: 5/5 (was 5/5) Left Hip ADduction: 5/5 Left Knee Flexion: 5/5 Left Knee Extension: 5/5  Mobility/Balance  Static Standing Balance Single Leg Stance - Right Leg: 20 Single Leg Stance - Left Leg: 20 Tandem Stance - Right Leg: 30 Tandem Stance - Left Leg: 30   Physical Therapy Assessment and Plan PT Assessment and Plan Clinical Impression Statement: Ms. Pharo has attneded 14 OP PT visits over the past 6 weeks with the following findings: at this time pt has improved functional  strength, is limited by her Rt hip AROM and has a positive scour test causing significant pain to inguinal and into adductor region.  Pt without pain with resisted manual muscle tests.  Encouraged pt to f/u with PCP and discussed recieving a referral to seek an orthopediic consult due to likely having hip joint pain PT Plan: D/C    Goals Home Exercise Program Pt/caregiver will Perform Home Exercise Program: Independently;For increased strengthening;For increased ROM PT Goal: Perform Home Exercise Program - Progress: Met PT Short Term Goals Time to Complete Short Term Goals: 3 weeks PT Short Term Goal 1: Pt wil improve Rt hip IR AROM to 10 degrees to improve pelvic rotation with gait.  PT Short Term Goal 1 - Progress: Met PT Short Term Goal 2: Pt will report a decrease in muscle stiffness by 50% for improved QOL.  PT Short Term Goal 2 - Progress: Met PT Short Term Goal 3: Pt will be educated on proper sitting and standing posture.  PT Short Term Goal 3 - Progress: Met PT Short Term Goal 4: Pt will improve trunk and LE flexibility in order to stand in neutral postion to decrease pain to her low back.  PT Short Term Goal 4 - Progress: Met PT Short Term Goal 5: Pt will improve her proprioceptive awareness and demonstrate Lt and Rt SLS x20 seconds on solid surface to decrease falls.  PT Long Term Goals Time to Complete Long Term Goals:  (6 weeks. ) PT Long Term Goal 1: Pt will improve her postural strength to [facility] in order to maintain a neutral spine while walking for 10 minutes to decrease  risk of secondary injuries.  PT Long Term Goal 1 - Progress: Met PT Long Term Goal 2: Pt will improve her lumbar flexibility and LE flexibility to WNL in order to perform lumbar AROM to WNL for greater ease with picking up an object from the floor.  PT Long Term Goal 2 - Progress: Met Long Term Goal 3: Pt will improve her FOTO to Status greater than 60% and limaitions less than 40% for improved QOL.  Long Term  Goal 3 Progress: Met Long Term Goal 4: Pt will improve her hip AROM to [facility] in order to ambulate with normalized gait mechanics.  Long Term Goal 4 Progress: Met PT Long Term Goal 5: Pt will improve her BLE strength to [facility] In order to ambualte for up to 2 hours to be able to shop with greater ease.  Long Term Goal 5 Progress: Met  Problem List Patient Active Problem List   Diagnosis Date Noted  . Low back pain 11/12/2012   PT - End of Session Equipment Utilized During Treatment: Gait belt Activity Tolerance: Patient tolerated treatment well General Behavior During Therapy: [facility] for tasks assessed/performed PT Plan of Care PT Home Exercise Plan: Discussed joint vs. musculoskeletal pain. F/u with MD. Discussed HEP and continuing with exercises for strengthening.  Consulted and Agree with Plan of Care: Patient  GP Functional Assessment Tool Used: FOTO: 66/34 Functional Limitation: Other PT primary Other PT Primary Goal Status (W0981): At least 40 percent but less than 60 percent impaired, limited or restricted Other PT Primary Discharge Status [phone]): At least 40 percent but less than 60 percent impaired, limited or restricted  Jaquia Benedicto, MPT, ATC 12/20/2012, 12:26 PM  Physician Documentation Your signature is required to indicate approval of the treatment plan as stated above.  Please sign and either send electronically or make a copy of this report for your files and return this physician signed original.   Please mark one 1.__approve of plan  2. ___approve of plan with the following conditions.   ______________________________                                                          _____________________ Physician Signature                                                                                                             Date

## 2013-03-08 ENCOUNTER — Encounter (HOSPITAL_COMMUNITY): Payer: Self-pay | Admitting: Pharmacy Technician

## 2013-03-08 ENCOUNTER — Encounter (HOSPITAL_COMMUNITY)
Admission: RE | Admit: 2013-03-08 | Discharge: 2013-03-08 | Disposition: A | Discharge status: Home / Self Care | Payer: Medicare Other | Source: Ambulatory Visit | Attending: Ophthalmology | Admitting: Ophthalmology

## 2013-03-08 ENCOUNTER — Encounter (HOSPITAL_COMMUNITY): Payer: Self-pay

## 2013-03-08 ENCOUNTER — Other Ambulatory Visit: Payer: Self-pay

## 2013-03-08 LAB — HEMOGLOBIN AND HEMATOCRIT, BLOOD
HCT: 41.5 % (ref 36.0–46.0)
HEMOGLOBIN: 14.1 g/dL (ref 12.0–15.0)

## 2013-03-08 LAB — BASIC METABOLIC PANEL
BUN: 11 mg/dL (ref 6–23)
CO2: 28 mEq/L (ref 19–32)
CREATININE: 0.79 mg/dL (ref 0.50–1.10)
Calcium: 9.1 mg/dL (ref 8.4–10.5)
Chloride: 106 mEq/L (ref 96–112)
GFR calc non Af Amer: 79 mL/min — ABNORMAL LOW (ref >90)
Glucose, Bld: 155 mg/dL — ABNORMAL HIGH (ref 70–99)
POTASSIUM: 3.6 meq/L — AB (ref 3.7–5.3)
Sodium: 146 mEq/L (ref 137–147)

## 2013-03-08 NOTE — Patient Instructions (Signed)
Your procedure is scheduled on: 03/11/2013  Report to [facility] at  0930  AM.  Call this number if you have problems the morning of surgery: [phone]   Do not eat food or drink liquids :After Midnight.      Take these medicines the morning of surgery with A SIP OF WATER: atenolol   Do not wear jewelry, make-up or nail polish.  Do not wear lotions, powders, or perfumes.   Do not shave 48 hours prior to surgery.  Do not bring valuables to the hospital.  Contacts, dentures or bridgework may not be worn into surgery.  Leave suitcase in the car. After surgery it may be brought to your room.  For patients admitted to the hospital, checkout time is 11:00 AM the day of discharge.   Patients discharged the day of surgery will not be allowed to drive home.  :     Please read over the following fact sheets that you were given: Coughing and Deep Breathing, Surgical Site Infection Prevention, Anesthesia Post-op Instructions and Care and Recovery After Surgery    Cataract A cataract is a clouding of the lens of the eye. When a lens becomes cloudy, vision is reduced based on the degree and nature of the clouding. Many cataracts reduce vision to some degree. Some cataracts make people more near-sighted as they develop. Other cataracts increase glare. Cataracts that are ignored and become worse can sometimes look white. The white color can be seen through the pupil. CAUSES   Aging. However, cataracts may occur at any age, even in newborns.   Certain drugs.   Trauma to the eye.   Certain diseases such as diabetes.   Specific eye diseases such as chronic inflammation inside the eye or a sudden attack of a rare form of glaucoma.   Inherited or acquired medical problems.  SYMPTOMS   Gradual, progressive drop in vision in the affected eye.   Severe, rapid visual loss. This most often happens when trauma is the cause.  DIAGNOSIS  To detect a cataract, an eye doctor examines the lens. Cataracts  are best diagnosed with an exam of the eyes with the pupils enlarged (dilated) by drops.  TREATMENT  For an early cataract, vision may improve by using different eyeglasses or stronger lighting. If that does not help your vision, surgery is the only effective treatment. A cataract needs to be surgically removed when vision loss interferes with your everyday activities, such as driving, reading, or watching TV. A cataract may also have to be removed if it prevents examination or treatment of another eye problem. Surgery removes the cloudy lens and usually replaces it with a substitute lens (intraocular lens, IOL).  At a time when both you and your doctor agree, the cataract will be surgically removed. If you have cataracts in both eyes, only one is usually removed at a time. This allows the operated eye to heal and be out of danger from any possible problems after surgery (such as infection or poor wound healing). In rare cases, a cataract may be doing damage to your eye. In these cases, your caregiver may advise surgical removal right away. The vast majority of people who have cataract surgery have better vision afterward. HOME CARE INSTRUCTIONS  If you are not planning surgery, you may be asked to do the following:  Use different eyeglasses.   Use stronger or brighter lighting.   Ask your eye doctor about reducing your medicine dose or changing medicines if  it is thought that a medicine caused your cataract. Changing medicines does not make the cataract go away on its own.   Become familiar with your surroundings. Poor vision can lead to injury. Avoid bumping into things on the affected side. You are at a higher risk for tripping or falling.   Exercise extreme care when driving or operating machinery.   Wear sunglasses if you are sensitive to bright light or experiencing problems with glare.  SEEK IMMEDIATE MEDICAL CARE IF:   You have a worsening or sudden vision loss.   You notice redness,  swelling, or increasing pain in the eye.   You have a fever.  Document Released: 12/20/2004 Document Revised: 12/09/2010 Document Reviewed: 08/13/2010 [facility] Patient Information 2012 [place].PATIENT INSTRUCTIONS POST-ANESTHESIA  IMMEDIATELY FOLLOWING SURGERY:  Do not drive or operate machinery for the first twenty four hours after surgery.  Do not make any important decisions for twenty four hours after surgery or while taking narcotic pain medications or sedatives.  If you develop intractable nausea and vomiting or a severe headache please notify your doctor immediately.  FOLLOW-UP:  Please make an appointment with your surgeon as instructed. You do not need to follow up with anesthesia unless specifically instructed to do so.  WOUND CARE INSTRUCTIONS (if applicable):  Keep a dry clean dressing on the anesthesia/puncture wound site if there is drainage.  Once the wound has quit draining you may leave it open to air.  Generally you should leave the bandage intact for twenty four hours unless there is drainage.  If the epidural site drains for more than 36-48 hours please call the anesthesia department.  QUESTIONS?:  Please feel free to call your physician or the hospital operator if you have any questions, and they will be happy to assist you.

## 2013-03-11 ENCOUNTER — Encounter (HOSPITAL_COMMUNITY): Payer: Self-pay

## 2013-03-11 ENCOUNTER — Ambulatory Visit (HOSPITAL_COMMUNITY): Payer: Medicare Other | Admitting: Anesthesiology

## 2013-03-11 ENCOUNTER — Encounter (HOSPITAL_COMMUNITY): Payer: Medicare Other | Admitting: Anesthesiology

## 2013-03-11 ENCOUNTER — Encounter (HOSPITAL_COMMUNITY)
Admission: RE | Disposition: A | Discharge status: Home / Self Care | Payer: Self-pay | Source: Ambulatory Visit | Attending: Ophthalmology

## 2013-03-11 ENCOUNTER — Ambulatory Visit (HOSPITAL_COMMUNITY)
Admission: RE | Admit: 2013-03-11 | Discharge: 2013-03-11 | Disposition: A | Discharge status: Home / Self Care | Payer: Medicare Other | Source: Ambulatory Visit | Attending: Ophthalmology | Admitting: Ophthalmology

## 2013-03-11 DIAGNOSIS — H2589 Other age-related cataract: Secondary | ICD-10-CM | POA: Insufficient documentation

## 2013-03-11 DIAGNOSIS — Z87891 Personal history of nicotine dependence: Secondary | ICD-10-CM | POA: Insufficient documentation

## 2013-03-11 DIAGNOSIS — I10 Essential (primary) hypertension: Secondary | ICD-10-CM | POA: Insufficient documentation

## 2013-03-11 DIAGNOSIS — Z79899 Other long term (current) drug therapy: Secondary | ICD-10-CM | POA: Insufficient documentation

## 2013-03-11 HISTORY — PX: CATARACT EXTRACTION W/PHACO: SHX586

## 2013-03-11 SURGERY — PHACOEMULSIFICATION, CATARACT, WITH IOL INSERTION
Anesthesia: Monitor Anesthesia Care | Site: Eye | Laterality: Left

## 2013-03-11 MED ORDER — LIDOCAINE HCL 3.5 % OP GEL
OPHTHALMIC | Status: DC | PRN
  Administered 2013-03-11: 1 via OPHTHALMIC

## 2013-03-11 MED ORDER — LIDOCAINE HCL 3.5 % OP GEL: 1.0000 "application " | Freq: Once | OPHTHALMIC | Status: DC

## 2013-03-11 MED ORDER — PROVISC 10 MG/ML IO SOLN
INTRAOCULAR | Status: DC | PRN
  Administered 2013-03-11: 0.85 mL via INTRAOCULAR

## 2013-03-11 MED ORDER — MIDAZOLAM HCL 2 MG/2ML IJ SOLN
1.0000 mg | INTRAMUSCULAR | Status: DC | PRN
  Administered 2013-03-11: 2 mg via INTRAVENOUS

## 2013-03-11 MED ORDER — LACTATED RINGERS IV SOLN
INTRAVENOUS | Status: DC
  Administered 2013-03-11: 10:00:00 via INTRAVENOUS

## 2013-03-11 MED ORDER — TETRACAINE HCL 0.5 % OP SOLN
1.0000 [drp] | OPHTHALMIC | Status: AC
  Administered 2013-03-11 (×3): 1 [drp] via OPHTHALMIC

## 2013-03-11 MED ORDER — FENTANYL CITRATE 0.05 MG/ML IJ SOLN
INTRAMUSCULAR | Status: AC
  Filled 2013-03-11: qty 2

## 2013-03-11 MED ORDER — BSS IO SOLN
INTRAOCULAR | Status: DC | PRN
  Administered 2013-03-11: 15 mL via INTRAOCULAR

## 2013-03-11 MED ORDER — EPINEPHRINE HCL 1 MG/ML IJ SOLN
INTRAMUSCULAR | Status: AC
  Filled 2013-03-11: qty 1

## 2013-03-11 MED ORDER — FENTANYL CITRATE 0.05 MG/ML IJ SOLN
25.0000 ug | INTRAMUSCULAR | Status: AC
  Administered 2013-03-11 (×2): 25 ug via INTRAVENOUS

## 2013-03-11 MED ORDER — PHENYLEPHRINE HCL 2.5 % OP SOLN
1.0000 [drp] | OPHTHALMIC | Status: AC
  Administered 2013-03-11 (×3): 1 [drp] via OPHTHALMIC

## 2013-03-11 MED ORDER — POVIDONE-IODINE 5 % OP SOLN
OPHTHALMIC | Status: DC | PRN
  Administered 2013-03-11: 1 via OPHTHALMIC

## 2013-03-11 MED ORDER — EPINEPHRINE HCL 1 MG/ML IJ SOLN
INTRAOCULAR | Status: DC | PRN
  Administered 2013-03-11: 10:00:00

## 2013-03-11 MED ORDER — CYCLOPENTOLATE-PHENYLEPHRINE 0.2-1 % OP SOLN
1.0000 [drp] | OPHTHALMIC | Status: AC
  Administered 2013-03-11 (×3): 1 [drp] via OPHTHALMIC

## 2013-03-11 MED ORDER — NEOMYCIN-POLYMYXIN-DEXAMETH 3.5-10000-0.1 OP SUSP
OPHTHALMIC | Status: DC | PRN
  Administered 2013-03-11: 1 [drp] via OPHTHALMIC

## 2013-03-11 MED ORDER — LIDOCAINE HCL (PF) 1 % IJ SOLN
INTRAMUSCULAR | Status: DC | PRN
  Administered 2013-03-11: .8 mL

## 2013-03-11 MED ORDER — MIDAZOLAM HCL 2 MG/2ML IJ SOLN
INTRAMUSCULAR | Status: AC
  Filled 2013-03-11: qty 2

## 2013-03-11 SURGICAL SUPPLY — 32 items

## 2013-03-11 NOTE — Anesthesia Preprocedure Evaluation (Signed)
Anesthesia Evaluation  Patient identified by MRN, date of birth, ID band Patient awake    Reviewed: Allergy & Precautions, H&P , NPO status , Patient's Chart, lab work & pertinent test results, reviewed documented beta blocker date and time   Airway Mallampati: II TM Distance: >3 FB     Dental  (+) Edentulous Upper, Partial Lower   Pulmonary former smoker,  breath sounds clear to auscultation        Cardiovascular hypertension, Pt. on home beta blockers and Pt. on medications Rhythm:Regular Rate:Normal     Neuro/Psych    GI/Hepatic negative GI ROS,   Endo/Other    Renal/GU      Musculoskeletal   Abdominal   Peds  Hematology   Anesthesia Other Findings   Reproductive/Obstetrics                           Anesthesia Physical Anesthesia Plan  ASA: II  Anesthesia Plan: MAC   Post-op Pain Management:    Induction: Intravenous  Airway Management Planned: Nasal Cannula  Additional Equipment:   Intra-op Plan:   Post-operative Plan:   Informed Consent: I have reviewed the patients History and Physical, chart, labs and discussed the procedure including the risks, benefits and alternatives for the proposed anesthesia with the patient or authorized representative who has indicated his/her understanding and acceptance.     Plan Discussed with:   Anesthesia Plan Comments:         Anesthesia Quick Evaluation

## 2013-03-11 NOTE — Op Note (Signed)
Date of Admission: 03/11/2013  Date of Surgery: 03/11/2013  Pre-Op Dx: Cataract  Left  Eye  Post-Op Dx: Combined Cataract  Left  Eye,  Dx Code 366.19  Surgeon: Tonny Branch, M.D.  Assistants: None  Anesthesia: Topical with MAC  Indications: Painless, progressive loss of vision with compromise of daily activities.  Surgery: Cataract Extraction with Intraocular lens Implant Left Eye  Discription: The patient had dilating drops and viscous lidocaine placed into the left eye in the pre-op holding area. After transfer to the operating room, a time out was performed. The patient was then prepped and draped. Beginning with a 49 degree blade a paracentesis port was made at the surgeon's 2 o'clock position. The anterior chamber was then filled with 1% non-preserved lidocaine. This was followed by filling the anterior chamber with Provisc. A 2.63mm keratome blade was used to make a clear corneal incision at the temporal limbus. A bent cystatome needle was used to create a contious tear capsulotomy. Hydrodissection was performed with balanced salt solution on a Fine canula. The lens nucleus was then removed using the phacoemulsification handpiece. Residual cortex was removed with the I&A handpiece. The anterior chamber and capsular bag were refilled with Provisc. A posterior chamber intraocular lens was placed into the capsular bag with it's injector. The implant was positioned with the Kuglan hook. The Provisc was then removed from the anterior chamber and capsular bag with the I&A handpiece. Stromal hydration of the main incision and paracentesis port was performed with BSS on a Fine canula. The wounds were tested for leak which was negative. The patient tolerated the procedure well. There were no operative complications. The patient was then transferred to the recovery room in stable condition.  Complications: None  Specimen: None  EBL: None  Prosthetic device: B&L enVista, MX60, power 29.0D, SN  [id number].

## 2013-03-11 NOTE — Discharge Instructions (Signed)

## 2013-03-11 NOTE — Anesthesia Postprocedure Evaluation (Signed)
  Anesthesia Post-op Note  Patient: Tiffany Sweeney  Procedure(s) Performed: Procedure(s) with comments: CATARACT EXTRACTION PHACO AND INTRAOCULAR LENS PLACEMENT (IOC) (Left) - CDE:7.54  Patient Location: Short Stay  Anesthesia Type:MAC  Level of Consciousness: awake, alert , oriented and patient cooperative  Airway and Oxygen Therapy: Patient Spontanous Breathing  Post-op Pain: none  Post-op Assessment: Post-op Vital signs reviewed, Patient's Cardiovascular Status Stable, Respiratory Function Stable, Patent Airway and Pain level controlled  Post-op Vital Signs: Reviewed and stable  Complications: No apparent anesthesia complications

## 2013-03-11 NOTE — Transfer of Care (Signed)
Immediate Anesthesia Transfer of Care Note  Patient: Tiffany Sweeney  Procedure(s) Performed: Procedure(s) with comments: CATARACT EXTRACTION PHACO AND INTRAOCULAR LENS PLACEMENT (IOC) (Left) - CDE:7.54  Patient Location: Short Stay  Anesthesia Type:MAC  Level of Consciousness: awake, alert , oriented and patient cooperative  Airway & Oxygen Therapy: Patient Spontanous Breathing  Post-op Assessment: Report given to PACU RN, Post -op Vital signs reviewed and stable and Patient moving all extremities  Post vital signs: Reviewed and stable  Complications: No apparent anesthesia complications

## 2013-03-11 NOTE — H&P (Signed)
I have reviewed the H&P, the patient was re-examined, and I have identified no interval changes in medical condition and plan of care since the history and physical of record  

## 2013-03-12 ENCOUNTER — Encounter (HOSPITAL_COMMUNITY): Payer: Self-pay | Admitting: Ophthalmology

## 2013-04-05 ENCOUNTER — Encounter (HOSPITAL_COMMUNITY): Payer: Self-pay | Admitting: Pharmacy Technician

## 2013-04-09 ENCOUNTER — Encounter (HOSPITAL_COMMUNITY): Admission: RE | Admit: 2013-04-09 | Payer: Medicare Other | Source: Ambulatory Visit

## 2013-04-12 MED ORDER — LIDOCAINE HCL (PF) 1 % IJ SOLN
INTRAMUSCULAR | Status: AC
  Filled 2013-04-12: qty 2

## 2013-04-12 MED ORDER — PHENYLEPHRINE HCL 2.5 % OP SOLN
OPHTHALMIC | Status: AC
  Filled 2013-04-12: qty 15

## 2013-04-12 MED ORDER — CYCLOPENTOLATE-PHENYLEPHRINE OP SOLN OPTIME - NO CHARGE
OPHTHALMIC | Status: AC
  Filled 2013-04-12: qty 2

## 2013-04-12 MED ORDER — LIDOCAINE HCL 3.5 % OP GEL
OPHTHALMIC | Status: AC
  Filled 2013-04-12: qty 1

## 2013-04-12 MED ORDER — TETRACAINE HCL 0.5 % OP SOLN
OPHTHALMIC | Status: AC
  Filled 2013-04-12: qty 2

## 2013-04-12 MED ORDER — NEOMYCIN-POLYMYXIN-DEXAMETH 3.5-10000-0.1 OP SUSP
OPHTHALMIC | Status: AC
  Filled 2013-04-12: qty 5

## 2013-04-15 ENCOUNTER — Encounter (HOSPITAL_COMMUNITY): Payer: Medicare Other | Admitting: Anesthesiology

## 2013-04-15 ENCOUNTER — Ambulatory Visit (HOSPITAL_COMMUNITY): Payer: Medicare Other | Admitting: Anesthesiology

## 2013-04-15 ENCOUNTER — Ambulatory Visit (HOSPITAL_COMMUNITY)
Admission: RE | Admit: 2013-04-15 | Discharge: 2013-04-15 | Disposition: A | Discharge status: Home / Self Care | Payer: Medicare Other | Source: Ambulatory Visit | Attending: Ophthalmology | Admitting: Ophthalmology

## 2013-04-15 ENCOUNTER — Encounter (HOSPITAL_COMMUNITY)
Admission: RE | Disposition: A | Discharge status: Home / Self Care | Payer: Self-pay | Source: Ambulatory Visit | Attending: Ophthalmology

## 2013-04-15 ENCOUNTER — Encounter (HOSPITAL_COMMUNITY): Payer: Self-pay

## 2013-04-15 DIAGNOSIS — Z87891 Personal history of nicotine dependence: Secondary | ICD-10-CM | POA: Insufficient documentation

## 2013-04-15 DIAGNOSIS — H2589 Other age-related cataract: Secondary | ICD-10-CM | POA: Insufficient documentation

## 2013-04-15 DIAGNOSIS — Z79899 Other long term (current) drug therapy: Secondary | ICD-10-CM | POA: Insufficient documentation

## 2013-04-15 DIAGNOSIS — I10 Essential (primary) hypertension: Secondary | ICD-10-CM | POA: Insufficient documentation

## 2013-04-15 HISTORY — PX: CATARACT EXTRACTION W/PHACO: SHX586

## 2013-04-15 SURGERY — PHACOEMULSIFICATION, CATARACT, WITH IOL INSERTION
Anesthesia: Monitor Anesthesia Care | Site: Eye | Laterality: Right

## 2013-04-15 MED ORDER — POVIDONE-IODINE 5 % OP SOLN
OPHTHALMIC | Status: DC | PRN
  Administered 2013-04-15: 1 via OPHTHALMIC

## 2013-04-15 MED ORDER — PROVISC 10 MG/ML IO SOLN
INTRAOCULAR | Status: DC | PRN
  Administered 2013-04-15: 0.85 mL via INTRAOCULAR

## 2013-04-15 MED ORDER — LACTATED RINGERS IV SOLN
INTRAVENOUS | Status: DC
  Administered 2013-04-15: 11:00:00 via INTRAVENOUS

## 2013-04-15 MED ORDER — MIDAZOLAM HCL 2 MG/2ML IJ SOLN
INTRAMUSCULAR | Status: AC
  Filled 2013-04-15: qty 2

## 2013-04-15 MED ORDER — EPINEPHRINE HCL 1 MG/ML IJ SOLN
INTRAMUSCULAR | Status: AC
  Filled 2013-04-15: qty 1

## 2013-04-15 MED ORDER — NEOMYCIN-POLYMYXIN-DEXAMETH 3.5-10000-0.1 OP SUSP
OPHTHALMIC | Status: DC | PRN
  Administered 2013-04-15: 2 [drp] via OPHTHALMIC

## 2013-04-15 MED ORDER — MIDAZOLAM HCL 2 MG/2ML IJ SOLN
1.0000 mg | INTRAMUSCULAR | Status: DC | PRN
  Administered 2013-04-15 (×2): 2 mg via INTRAVENOUS

## 2013-04-15 MED ORDER — FENTANYL CITRATE 0.05 MG/ML IJ SOLN
INTRAMUSCULAR | Status: AC
  Filled 2013-04-15: qty 2

## 2013-04-15 MED ORDER — EPINEPHRINE HCL 1 MG/ML IJ SOLN
INTRAMUSCULAR | Status: DC | PRN
  Administered 2013-04-15: 12:00:00

## 2013-04-15 MED ORDER — LIDOCAINE HCL (PF) 1 % IJ SOLN
INTRAMUSCULAR | Status: DC | PRN
  Administered 2013-04-15: .4 mL

## 2013-04-15 MED ORDER — LIDOCAINE HCL 3.5 % OP GEL
1.0000 "application " | Freq: Once | OPHTHALMIC | Status: AC
  Administered 2013-04-15: 1 via OPHTHALMIC

## 2013-04-15 MED ORDER — FENTANYL CITRATE 0.05 MG/ML IJ SOLN
25.0000 ug | INTRAMUSCULAR | Status: AC
  Administered 2013-04-15 (×2): 25 ug via INTRAVENOUS

## 2013-04-15 MED ORDER — CYCLOPENTOLATE-PHENYLEPHRINE 0.2-1 % OP SOLN
1.0000 [drp] | OPHTHALMIC | Status: AC
  Administered 2013-04-15 (×3): 1 [drp] via OPHTHALMIC

## 2013-04-15 MED ORDER — PHENYLEPHRINE HCL 2.5 % OP SOLN
1.0000 [drp] | OPHTHALMIC | Status: AC
  Administered 2013-04-15 (×3): 1 [drp] via OPHTHALMIC

## 2013-04-15 MED ORDER — BSS IO SOLN
INTRAOCULAR | Status: DC | PRN
  Administered 2013-04-15: 15 mL via INTRAOCULAR

## 2013-04-15 MED ORDER — TETRACAINE HCL 0.5 % OP SOLN
1.0000 [drp] | OPHTHALMIC | Status: AC
  Administered 2013-04-15 (×3): 1 [drp] via OPHTHALMIC

## 2013-04-15 SURGICAL SUPPLY — 32 items

## 2013-04-15 NOTE — Anesthesia Postprocedure Evaluation (Signed)
  Anesthesia Post-op Note  Patient: Tiffany Sweeney  Procedure(s) Performed: Procedure(s) with comments: CATARACT EXTRACTION PHACO AND INTRAOCULAR LENS PLACEMENT (IOC) (Right) - CDE 9.38  Patient Location: Short Stay  Anesthesia Type:MAC  Level of Consciousness: awake, alert , oriented and patient cooperative  Airway and Oxygen Therapy: Patient Spontanous Breathing  Post-op Pain: none  Post-op Assessment: Post-op Vital signs reviewed, Patient's Cardiovascular Status Stable and Pain level controlled  Post-op Vital Signs: Reviewed and stable  Last Vitals:  Filed Vitals:   04/15/13 1200  BP: 132/65  Pulse:   Temp:   Resp: 60    Complications: No apparent anesthesia complications

## 2013-04-15 NOTE — Anesthesia Preprocedure Evaluation (Signed)
Anesthesia Evaluation  Patient identified by MRN, date of birth, ID band Patient awake    Reviewed: Allergy & Precautions, H&P , NPO status , Patient's Chart, lab work & pertinent test results, reviewed documented beta blocker date and time   Airway Mallampati: II TM Distance: >3 FB     Dental  (+) Edentulous Upper, Partial Lower   Pulmonary former smoker,  breath sounds clear to auscultation        Cardiovascular hypertension, Pt. on home beta blockers and Pt. on medications Rhythm:Regular Rate:Normal     Neuro/Psych    GI/Hepatic negative GI ROS,   Endo/Other    Renal/GU      Musculoskeletal   Abdominal   Peds  Hematology   Anesthesia Other Findings   Reproductive/Obstetrics                           Anesthesia Physical Anesthesia Plan  ASA: II  Anesthesia Plan: MAC   Post-op Pain Management:    Induction: Intravenous  Airway Management Planned: Nasal Cannula  Additional Equipment:   Intra-op Plan:   Post-operative Plan:   Informed Consent: I have reviewed the patients History and Physical, chart, labs and discussed the procedure including the risks, benefits and alternatives for the proposed anesthesia with the patient or authorized representative who has indicated his/her understanding and acceptance.     Plan Discussed with:   Anesthesia Plan Comments:         Anesthesia Quick Evaluation

## 2013-04-15 NOTE — Op Note (Signed)
Date of Admission: 04/15/2013  Date of Surgery: 04/15/2013  Pre-Op Dx: Cataract Right  Eye  Post-Op Dx: Combined Cataract  Right  Eye,  Dx Code 366.19  Surgeon: Tonny Branch, M.D.  Assistants: None  Anesthesia: Topical with MAC  Indications: Painless, progressive loss of vision with compromise of daily activities.  Surgery: Cataract Extraction with Intraocular lens Implant Right Eye  Discription: The patient had dilating drops and viscous lidocaine placed into the Right eye in the pre-op holding area. After transfer to the operating room, a time out was performed. The patient was then prepped and draped. Beginning with a 1 degree blade a paracentesis port was made at the surgeon's 2 o'clock position. The anterior chamber was then filled with 1% non-preserved lidocaine. This was followed by filling the anterior chamber with Provisc.  A 2.76mm keratome blade was used to make a clear corneal incision at the temporal limbus.  A bent cystatome needle was used to create a continuous tear capsulotomy. Hydrodissection was performed with balanced salt solution on a Fine canula. The lens nucleus was then removed using the phacoemulsification handpiece. Residual cortex was removed with the I&A handpiece. The anterior chamber and capsular bag were refilled with Provisc. A posterior chamber intraocular lens was placed into the capsular bag with it's injector. The implant was positioned with the Kuglan hook. The Provisc was then removed from the anterior chamber and capsular bag with the I&A handpiece. Stromal hydration of the main incision and paracentesis port was performed with BSS on a Fine canula. The wounds were tested for leak which was negative. The patient tolerated the procedure well. There were no operative complications. The patient was then transferred to the recovery room in stable condition.  Complications: None  Specimen: None  EBL: None  Prosthetic device: B&L enVista, MX60, power 28.5D, SN  [id number].

## 2013-04-15 NOTE — Transfer of Care (Signed)
Immediate Anesthesia Transfer of Care Note  Patient: Tiffany Sweeney  Procedure(s) Performed: Procedure(s) with comments: CATARACT EXTRACTION PHACO AND INTRAOCULAR LENS PLACEMENT (IOC) (Right) - CDE 9.38  Patient Location: Short Stay  Anesthesia Type:MAC  Level of Consciousness: awake, alert , oriented and patient cooperative  Airway & Oxygen Therapy: Patient Spontanous Breathing  Post-op Assessment: Report given to PACU RN, Post -op Vital signs reviewed and stable and Patient moving all extremities  Post vital signs: Reviewed and stable  Complications: No apparent anesthesia complications

## 2013-04-15 NOTE — H&P (Signed)
I have reviewed the H&P, the patient was re-examined, and I have identified no interval changes in medical condition and plan of care since the history and physical of record  

## 2013-04-17 ENCOUNTER — Encounter (HOSPITAL_COMMUNITY): Payer: Self-pay | Admitting: Ophthalmology

## 2013-07-25 ENCOUNTER — Ambulatory Visit (HOSPITAL_COMMUNITY)
Admission: RE | Admit: 2013-07-25 | Discharge: 2013-07-25 | Disposition: A | Discharge status: Home / Self Care | Payer: Medicare Other | Source: Ambulatory Visit | Attending: Family Medicine | Admitting: Family Medicine

## 2013-07-25 ENCOUNTER — Other Ambulatory Visit (HOSPITAL_COMMUNITY): Payer: Self-pay | Admitting: Family Medicine

## 2013-07-25 DIAGNOSIS — M25551 Pain in right hip: Secondary | ICD-10-CM

## 2013-07-25 DIAGNOSIS — M169 Osteoarthritis of hip, unspecified: Secondary | ICD-10-CM | POA: Diagnosis not present

## 2013-07-25 DIAGNOSIS — M47817 Spondylosis without myelopathy or radiculopathy, lumbosacral region: Secondary | ICD-10-CM | POA: Insufficient documentation

## 2013-07-25 DIAGNOSIS — M545 Low back pain: Secondary | ICD-10-CM

## 2013-07-25 DIAGNOSIS — M1610 Unilateral primary osteoarthritis, unspecified hip: Secondary | ICD-10-CM | POA: Diagnosis not present

## 2013-07-25 DIAGNOSIS — M79609 Pain in unspecified limb: Secondary | ICD-10-CM | POA: Diagnosis present

## 2013-09-26 ENCOUNTER — Other Ambulatory Visit: Payer: Self-pay | Admitting: Hematology & Oncology

## 2013-09-26 DIAGNOSIS — Z1231 Encounter for screening mammogram for malignant neoplasm of breast: Secondary | ICD-10-CM

## 2013-09-30 ENCOUNTER — Ambulatory Visit (HOSPITAL_COMMUNITY)
Admission: RE | Admit: 2013-09-30 | Discharge: 2013-09-30 | Disposition: A | Discharge status: Home / Self Care | Payer: Medicare Other | Source: Ambulatory Visit | Attending: Hematology & Oncology | Admitting: Hematology & Oncology

## 2013-09-30 DIAGNOSIS — Z1231 Encounter for screening mammogram for malignant neoplasm of breast: Secondary | ICD-10-CM | POA: Diagnosis present

## 2013-10-04 ENCOUNTER — Other Ambulatory Visit: Payer: Self-pay | Admitting: Family Medicine

## 2013-10-04 DIAGNOSIS — R928 Other abnormal and inconclusive findings on diagnostic imaging of breast: Secondary | ICD-10-CM

## 2013-10-08 ENCOUNTER — Telehealth: Payer: Self-pay | Admitting: Hematology & Oncology

## 2013-10-08 ENCOUNTER — Other Ambulatory Visit: Payer: Self-pay | Admitting: Family Medicine

## 2013-10-08 DIAGNOSIS — R928 Other abnormal and inconclusive findings on diagnostic imaging of breast: Secondary | ICD-10-CM

## 2013-10-08 NOTE — Telephone Encounter (Signed)
Pt is aware MD still wants to see her on 10-8

## 2013-10-09 ENCOUNTER — Other Ambulatory Visit: Payer: Self-pay

## 2013-10-09 DIAGNOSIS — D0591 Unspecified type of carcinoma in situ of right breast: Secondary | ICD-10-CM

## 2013-10-10 ENCOUNTER — Other Ambulatory Visit (HOSPITAL_BASED_OUTPATIENT_CLINIC_OR_DEPARTMENT_OTHER): Payer: Medicare Other | Admitting: Lab

## 2013-10-10 ENCOUNTER — Encounter: Payer: Self-pay | Admitting: Hematology & Oncology

## 2013-10-10 ENCOUNTER — Ambulatory Visit (HOSPITAL_BASED_OUTPATIENT_CLINIC_OR_DEPARTMENT_OTHER): Payer: Medicare Other | Admitting: Hematology & Oncology

## 2013-10-10 VITALS — BP 172/70 | HR 72 | Temp 98.2°F | Resp 14 | Ht 64.0 in | Wt 161.0 lb

## 2013-10-10 DIAGNOSIS — Z853 Personal history of malignant neoplasm of breast: Secondary | ICD-10-CM

## 2013-10-10 DIAGNOSIS — C50911 Malignant neoplasm of unspecified site of right female breast: Secondary | ICD-10-CM

## 2013-10-10 DIAGNOSIS — D0591 Unspecified type of carcinoma in situ of right breast: Secondary | ICD-10-CM

## 2013-10-10 DIAGNOSIS — Z170 Estrogen receptor positive status [ER+]: Principal | ICD-10-CM

## 2013-10-10 DIAGNOSIS — M810 Age-related osteoporosis without current pathological fracture: Secondary | ICD-10-CM

## 2013-10-10 LAB — COMPREHENSIVE METABOLIC PANEL
ALBUMIN: 3.9 g/dL (ref 3.5–5.2)
ALT: 11 U/L (ref 0–35)
AST: 15 U/L (ref 0–37)
Alkaline Phosphatase: 67 U/L (ref 39–117)
BUN: 13 mg/dL (ref 6–23)
CALCIUM: 9.1 mg/dL (ref 8.4–10.5)
CHLORIDE: 105 meq/L (ref 96–112)
CO2: 30 mEq/L (ref 19–32)
Creatinine, Ser: 0.78 mg/dL (ref 0.50–1.10)
Glucose, Bld: 80 mg/dL (ref 70–99)
POTASSIUM: 3.6 meq/L (ref 3.5–5.3)
SODIUM: 143 meq/L (ref 135–145)
TOTAL PROTEIN: 6.3 g/dL (ref 6.0–8.3)
Total Bilirubin: 0.5 mg/dL (ref 0.2–1.2)

## 2013-10-10 LAB — CBC WITH DIFFERENTIAL (CANCER CENTER ONLY)
BASO#: 0 10*3/uL (ref 0.0–0.2)
BASO%: 0.6 % (ref 0.0–2.0)
EOS%: 1.7 % (ref 0.0–7.0)
Eosinophils Absolute: 0.1 10*3/uL (ref 0.0–0.5)
HCT: 41.8 % (ref 34.8–46.6)
HEMOGLOBIN: 14 g/dL (ref 11.6–15.9)
LYMPH#: 2 10*3/uL (ref 0.9–3.3)
LYMPH%: 30.2 % (ref 14.0–48.0)
MCH: 29.8 pg (ref 26.0–34.0)
MCHC: 33.5 g/dL (ref 32.0–36.0)
MCV: 89 fL (ref 81–101)
MONO#: 0.6 10*3/uL (ref 0.1–0.9)
MONO%: 9.5 % (ref 0.0–13.0)
NEUT%: 58 % (ref 39.6–80.0)
NEUTROS ABS: 3.9 10*3/uL (ref 1.5–6.5)
PLATELETS: 220 10*3/uL (ref 145–400)
RBC: 4.7 10*6/uL (ref 3.70–5.32)
RDW: 13.3 % (ref 11.1–15.7)
WBC: 6.7 10*3/uL (ref 3.9–10.0)

## 2013-10-10 NOTE — Progress Notes (Signed)
Hematology and Oncology Follow Up Visit  Tiffany Sweeney [id number] 26-Jun-1934 79 y.o. 10/10/2013   Principle Diagnosis:  Stage I (T1 N0 M0) infiltrating ductal carcinoma of the right breast.  Current Therapy:    Observation     Interim History:  Ms.  Tiffany Sweeney is back for followup. Apparently been some issue of a recent mammogram. She is undergoing diagnostic mammogram of her left breast. There is some possible area of concern. She does have a diagnostic mammogram next week.  Otherwise, she been doing well. She's not noticed any changes with the left breast. She had a right mastectomy. There is she's had no cough. No shortness of breath. She's had no change in bowel or bladder habits. She's had no abdominal pain. There's been no weakness. There has been no leg swelling. She's had no headache. She's had no rashes.  She will be going to [place] in December with her family.  She's had no bleeding. She's had no fever.  Medications: Current outpatient prescriptions:atenolol (TENORMIN) 50 MG tablet, Take 50 mg by mouth daily., Disp: , Rfl: ;  LORazepam (ATIVAN) 0.5 MG tablet, Take 0.5 mg by mouth daily., Disp: , Rfl: ;  naproxen (NAPROSYN) 500 MG tablet, Take 500 mg by mouth every morning. TAKES 1/2 TAB DAILY, Disp: , Rfl:   Allergies: No Known Allergies  Past Medical History, Surgical history, Social history, and Family History were reviewed and updated.  Review of Systems: As above  Physical Exam:  height is 5\' 4"  (1.626 m) and weight is 161 lb (73.029 kg). Her oral temperature is 98.2 F (36.8 C). Her blood pressure is 172/70 and her pulse is 72. Her respiration is 14.   Well-developed and well-nourished white female. Head and neck exam shows no ocular or oral lesion. She is no palpable cervical or supraclavicular lymph nodes. Lungs are clear. Cardiac exam regular rate and rhythm with no murmurs, rubs or bruits. Breast exam shows left breast with no masses, edema or erythema. No left  axillary adenopathy is noted. No tenderness is noted with left breast palpation. Right chest wall shows a well-healed mastectomy. No right chest wall nodules are noted. Is no right axillary adenopathy. Abdominal exam is soft. She has good bowel sounds. There is no fluid wave. There is no palpable liver or spleen tip. Back exam shows no tenderness over the spine, ribs or hips. Extremities shows no clubbing, cyanosis or edema. Neurological exam is nonfocal.  Lab Results  Component Value Date   WBC 6.7 10/10/2013   HGB 14.0 10/10/2013   HCT 41.8 10/10/2013   MCV 89 10/10/2013   PLT 220 10/10/2013     Chemistry      Component Value Date/Time   NA 146 03/08/2013 0910   K 3.6* 03/08/2013 0910   CL 106 03/08/2013 0910   CO2 28 03/08/2013 0910   BUN 11 03/08/2013 0910   CREATININE 0.79 03/08/2013 0910      Component Value Date/Time   CALCIUM 9.1 03/08/2013 0910   ALKPHOS 62 09/28/2009 1314   AST 18 09/28/2009 1314   ALT 18 09/28/2009 1314   BILITOT 0.5 09/28/2009 1314         Impression and Plan: [name] is 79 year old female with a past history of a stage I ductal carcinoma the right breast. She had this 17 years ago.  I cannot find anything with the left breast on physical exam. She certainly is at risk for a left breast cancer. However, I think this would be  unlikely.   We will plan to see her back in one year unless there are problems.     Tiffany Napoleon, MD 10/8/201512:44 PM

## 2013-10-14 ENCOUNTER — Ambulatory Visit
Admission: RE | Admit: 2013-10-14 | Discharge: 2013-10-14 | Disposition: A | Discharge status: Home / Self Care | Payer: Medicare Other | Source: Ambulatory Visit | Attending: Family Medicine | Admitting: Family Medicine

## 2013-10-14 ENCOUNTER — Other Ambulatory Visit: Payer: Self-pay | Admitting: Family Medicine

## 2013-10-14 DIAGNOSIS — R928 Other abnormal and inconclusive findings on diagnostic imaging of breast: Secondary | ICD-10-CM

## 2013-10-18 ENCOUNTER — Other Ambulatory Visit: Payer: Self-pay | Admitting: Family Medicine

## 2013-10-18 DIAGNOSIS — R928 Other abnormal and inconclusive findings on diagnostic imaging of breast: Secondary | ICD-10-CM

## 2013-10-21 ENCOUNTER — Ambulatory Visit
Admission: RE | Admit: 2013-10-21 | Discharge: 2013-10-21 | Disposition: A | Discharge status: Home / Self Care | Payer: Medicare Other | Source: Ambulatory Visit | Attending: Family Medicine | Admitting: Family Medicine

## 2013-10-21 DIAGNOSIS — R928 Other abnormal and inconclusive findings on diagnostic imaging of breast: Secondary | ICD-10-CM

## 2013-10-29 ENCOUNTER — Encounter (HOSPITAL_COMMUNITY): Payer: Self-pay | Admitting: Pharmacy Technician

## 2013-10-29 NOTE — Patient Instructions (Signed)
Tiffany Sweeney  10/29/2013   Your procedure is scheduled on:  11/04/2013  Report to [facility] at 59  AM.  Call this number if you have problems the morning of surgery: [phone]   Remember:   Do not eat food or drink liquids after midnight.   Take these medicines the morning of surgery with A SIP OF WATER:  Atenolol, ativan   Do not wear jewelry, make-up or nail polish.  Do not wear lotions, powders, or perfumes.   Do not shave 48 hours prior to surgery. Men may shave face and neck.  Do not bring valuables to the hospital.  [facility] is not responsible for any belongings or valuables.               Contacts, dentures or bridgework may not be worn into surgery.  Leave suitcase in the car. After surgery it may be brought to your room.  For patients admitted to the hospital, discharge time is determined by your treatment team.               Patients discharged the day of surgery will not be allowed to drive home.  Name and phone number of your driver: family  Special Instructions: Shower using CHG 2 nights before surgery and the night before surgery.  If you shower the day of surgery use CHG.  Use special wash - you have one bottle of CHG for all showers.  You should use approximately 1/3 of the bottle for each shower.   Please read over the following fact sheets that you were given: Pain Booklet, Coughing and Deep Breathing, Surgical Site Infection Prevention, Anesthesia Post-op Instructions and Care and Recovery After Surgery Mastectomy, With or Without Reconstruction Mastectomy (removal of the breast) is a procedure most commonly used to treat cancer (tumor) of the breast. Different procedures are available for treatment. This depends on the stage of the tumor (abnormal growths). Discuss this with your caregiver, surgeon (a specialist for performing operations such as this), or oncologist (someone specialized in the treatment of cancer). With proper information, you can  decide which treatment is best for you. Although the sound of the word cancer is frightening to all of [place], the new treatments and medications can be a source of reassurance and comfort. If there are things you are worried about, discuss them with your caregiver. He or she can help comfort you and your family. Some of the different procedures for treating breast cancer are:  Radical (extensive) mastectomy. This is an operation used to remove the entire breast, the muscles under the breast, and all of the glands (lymph nodes) under the arm. With all of the new treatments available for cancer of the breast, this procedure has become less common.  Modified radical mastectomy. This is a similar operation to the radical mastectomy described above. In the modified radical mastectomy, the muscles of the chest wall are not removed unless one of the lessor muscles is removed. One of the lessor muscles may be removed to allow better removal of the lymph nodes. The axillary lymph nodes are also removed. Rarely, during an axillary node dissection nerves to this area are damaged. Radiation therapy is then often used to the area following this surgery.  A total mastectomy also known as a complete or simple mastectomy. It involves removal of only the breast. The lymph nodes and the muscles are left in place.  In a lumpectomy, the lump is removed from  the breast. This is the simplest form of surgical treatment. A sentinel lymph node biopsy may also be done. Additional treatment may be required. RISKS AND COMPLICATIONS The main problems that follow removal of the breast include:  Infection (germs start growing in the wound). This can usually be treated with antibiotics (medications that kill germs).  Lymphedema. This means the arm on the side of the breast that was operated on swells because the lymph (tissue fluid) cannot follow the main channels back into the body. This only occurs when the lymph nodes have had to be  removed under the arm.  There may be some areas of numbness to the upper arm and around the incision (cut by the surgeon) in the breast. This happens because of the cutting of or damage to some of the nerves in the area. This is most often unavoidable.  There may be difficulty moving the arm in a full range of motion (moving in all directions) following surgery. This usually improves with time following use and exercise.  Recurrence of breast cancer may happen with the very best of surgery and follow up treatment. Sometimes small cancer cells that cannot be seen with the naked eye have already spread at the time of surgery. When this happens other treatment is available. This treatment may be radiation, medications or a combination of both. RECONSTRUCTION Reconstruction of the breast may be done immediately if there is not going to be post-operative radiation. This surgery is done for cosmetic (improve appearance) purposes to improve the physical appearance after the operation. This may be done in two ways:  It can be done using a saline filled prosthetic (an artificial breast which is filled with salt water). Silicone breast implants are now re-approved by the FDA and are being commonly used.  Reconstruction can be done using the body's own muscle/fat/skin. Your caregiver will discuss your options with you. Depending upon your needs or choice, together you will be able to determine which procedure is best for you. Document Released: 09/14/2000 Document Revised: 09/14/2011 Document Reviewed: 05/08/2007 [facility] Patient Information 2015 [place], [place]. This information is not intended to replace advice given to you by your health care provider. Make sure you discuss any questions you have with your health care provider. PATIENT INSTRUCTIONS POST-ANESTHESIA  IMMEDIATELY FOLLOWING SURGERY:  Do not drive or operate machinery for the first twenty four hours after surgery.  Do not make any important  decisions for twenty four hours after surgery or while taking narcotic pain medications or sedatives.  If you develop intractable nausea and vomiting or a severe headache please notify your doctor immediately.  FOLLOW-UP:  Please make an appointment with your surgeon as instructed. You do not need to follow up with anesthesia unless specifically instructed to do so.  WOUND CARE INSTRUCTIONS (if applicable):  Keep a dry clean dressing on the anesthesia/puncture wound site if there is drainage.  Once the wound has quit draining you may leave it open to air.  Generally you should leave the bandage intact for twenty four hours unless there is drainage.  If the epidural site drains for more than 36-48 hours please call the anesthesia department.  QUESTIONS?:  Please feel free to call your physician or the hospital operator if you have any questions, and they will be happy to assist you.

## 2013-10-29 NOTE — H&P (Signed)
  NTS SOAP Note  Vital Signs:  Vitals as of: 72/82/0601: Systolic 561: Diastolic 89: Heart Rate 63: Temp 97.3F: Height 40ft 4in: Weight 160Lbs 0 Ounces: BMI 27.46  BMI : 27.46 kg/m2  Subjective: This 50 month/26 day old female presents for of a left breast cancer.  Found on routine mammography.  Biopsy positive for invasive ductal carcinoma.  ER/PR negative.  s/p right modified radical mastectomy in 1997.  No family h/o breast cancer noted.  Review of Symptoms:  Constitutional:unremarkable   Head:unremarkable Eyes:unremarkable   Nose/Mouth/Throat:unremarkable Cardiovascular:  unremarkable Respiratory:unremarkable Gastrointestinheartburn Genitourinary:unremarkable   joint and back pain Skin:unremarkable Hematolgic/Lymphatic:unremarkable   Allergic/Immunologic:unremarkable   Past Medical History:  Reviewed  Past Medical History  Surgical History: right modified radical mastectomy 1997,  TAH,  cataract surgery Medical Problems: HTN Allergies: nkda Medications: anatol,  anxiety med   Social History:Reviewed  Social History  Preferred Language: English Race:  White Ethnicity: Not Hispanic / Latino Age: 50 month/26 day Marital Status:  W Alcohol: no   Smoking Status: Never smoker reviewed on 10/29/2013 Functional Status reviewed on 10/29/2013 ------------------------------------------------ Bathing: Normal Cooking: Normal Dressing: Normal Driving: Normal Eating: Normal Managing Meds: Normal Oral Care: Normal Shopping: Normal Toileting: Normal Transferring: Normal Walking: Normal Cognitive Status reviewed on 10/29/2013 ------------------------------------------------ Attention: Normal Decision Making: Normal Language: Normal Memory: Normal Motor: Normal Perception: Normal Problem Solving: Normal Visual and Spatial: Normal   Family History:Reviewed  Family Health History Family History is Unknown    Objective  Information: General:Well appearing, well nourished in no distress. Neck:Supple without lymphadenopathy.  Heart:RRR, no murmur or gallop.  Normal S1, S2.  No S3, S4.  Lungs:  CTA bilaterally, no wheezes, rhonchi, rales.  Breathing unlabored. right mastectomy.  Left breast without dominant mass,  nipple discharge,  dimpling.  Axilla negative for palpable nodes. mammogram and path reports reviewed. Assessment:Left breast cancer,  clinical stage 1  Diagnoses: 174.9  C50.912 Primary malignant neoplasm of female breast (Malignant neoplasm of unspecified site of left female breast)  Procedures: [phone] - OFFICE OUTPATIENT NEW 30 MINUTES    Plan:  Discussed surgical options including modified radical mastectomy vs partial mastectomy/sentinel lymph node biopsy/xrt.  Patient has elected to proceed with left modified radical mastectomy on 11/04/13.   Patient Education:Alternative treatments to surgery were discussed with patient (and family).  Risks and benefits  of procedure including bleeding,  infection,  nerve injury,  and arm swelling were fully explained to the patient (and family) who gave informed consent. Patient/family questions were addressed.  Follow-up:Pending Surgery

## 2013-10-30 ENCOUNTER — Encounter (HOSPITAL_COMMUNITY): Payer: Self-pay

## 2013-10-30 ENCOUNTER — Encounter (HOSPITAL_COMMUNITY)
Admission: RE | Admit: 2013-10-30 | Discharge: 2013-10-30 | Disposition: A | Discharge status: Home / Self Care | Payer: Medicare Other | Source: Ambulatory Visit | Attending: General Surgery | Admitting: General Surgery

## 2013-10-30 ENCOUNTER — Ambulatory Visit (HOSPITAL_COMMUNITY)
Admission: RE | Admit: 2013-10-30 | Discharge: 2013-10-30 | Disposition: A | Discharge status: Home / Self Care | Payer: Medicare Other | Source: Ambulatory Visit | Attending: General Surgery | Admitting: General Surgery

## 2013-10-30 VITALS — BP 162/61 | HR 64 | Temp 98.4°F | Resp 18 | Ht 64.0 in | Wt 158.0 lb

## 2013-10-30 DIAGNOSIS — Z853 Personal history of malignant neoplasm of breast: Secondary | ICD-10-CM | POA: Insufficient documentation

## 2013-10-30 DIAGNOSIS — I10 Essential (primary) hypertension: Secondary | ICD-10-CM | POA: Insufficient documentation

## 2013-10-30 DIAGNOSIS — C50919 Malignant neoplasm of unspecified site of unspecified female breast: Secondary | ICD-10-CM

## 2013-10-30 DIAGNOSIS — C50912 Malignant neoplasm of unspecified site of left female breast: Secondary | ICD-10-CM

## 2013-10-30 HISTORY — DX: Unspecified osteoarthritis, unspecified site: M19.90

## 2013-10-30 HISTORY — DX: Anxiety disorder, unspecified: F41.9

## 2013-10-30 LAB — CBC WITH DIFFERENTIAL/PLATELET
BASOS PCT: 1 % (ref 0–1)
Basophils Absolute: 0.1 10*3/uL (ref 0.0–0.1)
EOS ABS: 0.1 10*3/uL (ref 0.0–0.7)
Eosinophils Relative: 2 % (ref 0–5)
HCT: 39.2 % (ref 36.0–46.0)
HEMOGLOBIN: 13.2 g/dL (ref 12.0–15.0)
LYMPHS ABS: 1.8 10*3/uL (ref 0.7–4.0)
Lymphocytes Relative: 30 % (ref 12–46)
MCH: 29.7 pg (ref 26.0–34.0)
MCHC: 33.7 g/dL (ref 30.0–36.0)
MCV: 88.1 fL (ref 78.0–100.0)
MONOS PCT: 9 % (ref 3–12)
Monocytes Absolute: 0.5 10*3/uL (ref 0.1–1.0)
Neutro Abs: 3.5 10*3/uL (ref 1.7–7.7)
Neutrophils Relative %: 58 % (ref 43–77)
Platelets: 239 10*3/uL (ref 150–400)
RBC: 4.45 MIL/uL (ref 3.87–5.11)
RDW: 13.2 % (ref 11.5–15.5)
WBC: 5.9 10*3/uL (ref 4.0–10.5)

## 2013-10-30 LAB — COMPREHENSIVE METABOLIC PANEL
ALK PHOS: 77 U/L (ref 39–117)
ALT: 11 U/L (ref 0–35)
ANION GAP: 10 (ref 5–15)
AST: 15 U/L (ref 0–37)
Albumin: 3.6 g/dL (ref 3.5–5.2)
BILIRUBIN TOTAL: 0.4 mg/dL (ref 0.3–1.2)
BUN: 10 mg/dL (ref 6–23)
CHLORIDE: 104 meq/L (ref 96–112)
CO2: 29 mEq/L (ref 19–32)
Calcium: 9.4 mg/dL (ref 8.4–10.5)
Creatinine, Ser: 0.8 mg/dL (ref 0.50–1.10)
GFR calc non Af Amer: 69 mL/min — ABNORMAL LOW (ref >90)
GFR, EST AFRICAN AMERICAN: 80 mL/min — AB (ref >90)
GLUCOSE: 89 mg/dL (ref 70–99)
Potassium: 4 mEq/L (ref 3.7–5.3)
Sodium: 143 mEq/L (ref 137–147)
Total Protein: 6.6 g/dL (ref 6.0–8.3)

## 2013-10-30 NOTE — Pre-Procedure Instructions (Signed)
Patient given information to sign up for my chart at home. 

## 2013-11-04 ENCOUNTER — Observation Stay (HOSPITAL_COMMUNITY)
Admission: RE | Admit: 2013-11-04 | Discharge: 2013-11-06 | Disposition: A | Discharge status: Home / Self Care | Payer: Medicare Other | Source: Ambulatory Visit | Attending: General Surgery | Admitting: General Surgery

## 2013-11-04 ENCOUNTER — Ambulatory Visit (HOSPITAL_COMMUNITY): Payer: Medicare Other | Admitting: Anesthesiology

## 2013-11-04 ENCOUNTER — Encounter (HOSPITAL_COMMUNITY)
Admission: RE | Disposition: A | Discharge status: Home / Self Care | Payer: Self-pay | Source: Ambulatory Visit | Attending: General Surgery

## 2013-11-04 ENCOUNTER — Other Ambulatory Visit: Payer: Self-pay | Admitting: Hematology & Oncology

## 2013-11-04 ENCOUNTER — Encounter (HOSPITAL_COMMUNITY): Payer: Self-pay | Admitting: *Deleted

## 2013-11-04 DIAGNOSIS — C50911 Malignant neoplasm of unspecified site of right female breast: Secondary | ICD-10-CM

## 2013-11-04 DIAGNOSIS — Z79899 Other long term (current) drug therapy: Secondary | ICD-10-CM | POA: Insufficient documentation

## 2013-11-04 DIAGNOSIS — C50912 Malignant neoplasm of unspecified site of left female breast: Principal | ICD-10-CM | POA: Insufficient documentation

## 2013-11-04 DIAGNOSIS — C50919 Malignant neoplasm of unspecified site of unspecified female breast: Secondary | ICD-10-CM | POA: Diagnosis present

## 2013-11-04 DIAGNOSIS — I10 Essential (primary) hypertension: Secondary | ICD-10-CM | POA: Insufficient documentation

## 2013-11-04 HISTORY — PX: MASTECTOMY MODIFIED RADICAL: SHX5962

## 2013-11-04 SURGERY — MASTECTOMY, MODIFIED RADICAL
Anesthesia: General | Site: Breast | Laterality: Left

## 2013-11-04 MED ORDER — MORPHINE SULFATE 2 MG/ML IJ SOLN
2.0000 mg | INTRAMUSCULAR | Status: DC | PRN
  Administered 2013-11-04 – 2013-11-05 (×6): 2 mg via INTRAVENOUS
  Filled 2013-11-04 (×6): qty 1

## 2013-11-04 MED ORDER — ONDANSETRON HCL 4 MG PO TABS: 4.0000 mg | ORAL_TABLET | Freq: Four times a day (QID) | ORAL | Status: DC | PRN

## 2013-11-04 MED ORDER — HYDROCODONE-ACETAMINOPHEN 5-325 MG PO TABS
1.0000 | ORAL_TABLET | ORAL | Status: DC | PRN
  Administered 2013-11-05: 2 via ORAL
  Administered 2013-11-05: 1 via ORAL
  Administered 2013-11-06 (×3): 2 via ORAL
  Filled 2013-11-04: qty 1
  Filled 2013-11-04 (×4): qty 2

## 2013-11-04 MED ORDER — ENOXAPARIN SODIUM 40 MG/0.4ML ~~LOC~~ SOLN
40.0000 mg | SUBCUTANEOUS | Status: DC
  Administered 2013-11-05 – 2013-11-06 (×2): 40 mg via SUBCUTANEOUS
  Filled 2013-11-04 (×2): qty 0.4

## 2013-11-04 MED ORDER — ROCURONIUM BROMIDE 100 MG/10ML IV SOLN
INTRAVENOUS | Status: DC | PRN
  Administered 2013-11-04: 5 mg via INTRAVENOUS
  Administered 2013-11-04: 25 mg via INTRAVENOUS

## 2013-11-04 MED ORDER — ATENOLOL 25 MG PO TABS
50.0000 mg | ORAL_TABLET | Freq: Every day | ORAL | Status: DC
  Administered 2013-11-04 – 2013-11-06 (×3): 50 mg via ORAL
  Filled 2013-11-04 (×3): qty 2

## 2013-11-04 MED ORDER — NEOSTIGMINE METHYLSULFATE 10 MG/10ML IV SOLN
INTRAVENOUS | Status: AC
  Filled 2013-11-04: qty 1

## 2013-11-04 MED ORDER — PROPOFOL 10 MG/ML IV BOLUS
INTRAVENOUS | Status: DC | PRN
  Administered 2013-11-04: 20 mg via INTRAVENOUS
  Administered 2013-11-04: 130 mg via INTRAVENOUS

## 2013-11-04 MED ORDER — CHLORHEXIDINE GLUCONATE 4 % EX LIQD
1.0000 | Freq: Once | CUTANEOUS | Status: DC
Start: 2013-11-04 — End: 2013-11-04

## 2013-11-04 MED ORDER — ENOXAPARIN SODIUM 40 MG/0.4ML ~~LOC~~ SOLN
40.0000 mg | Freq: Once | SUBCUTANEOUS | Status: AC
  Administered 2013-11-04: 40 mg via SUBCUTANEOUS
  Filled 2013-11-04: qty 0.4

## 2013-11-04 MED ORDER — ONDANSETRON HCL 4 MG/2ML IJ SOLN
4.0000 mg | Freq: Once | INTRAMUSCULAR | Status: AC
  Administered 2013-11-04: 4 mg via INTRAVENOUS

## 2013-11-04 MED ORDER — LACTATED RINGERS IV SOLN
INTRAVENOUS | Status: DC
Start: 2013-11-04 — End: 2013-11-04
  Administered 2013-11-04 (×2): via INTRAVENOUS

## 2013-11-04 MED ORDER — NEOSTIGMINE METHYLSULFATE 10 MG/10ML IV SOLN
INTRAVENOUS | Status: DC | PRN
  Administered 2013-11-04: 3 mg via INTRAVENOUS

## 2013-11-04 MED ORDER — MIDAZOLAM HCL 2 MG/2ML IJ SOLN
1.0000 mg | INTRAMUSCULAR | Status: DC | PRN
  Administered 2013-11-04 (×2): 2 mg via INTRAVENOUS
  Filled 2013-11-04: qty 2

## 2013-11-04 MED ORDER — ROCURONIUM BROMIDE 50 MG/5ML IV SOLN
INTRAVENOUS | Status: AC
  Filled 2013-11-04: qty 1

## 2013-11-04 MED ORDER — LORAZEPAM 2 MG/ML IJ SOLN: 1.0000 mg | INTRAMUSCULAR | Status: DC | PRN

## 2013-11-04 MED ORDER — LIDOCAINE HCL (CARDIAC) 10 MG/ML IV SOLN
INTRAVENOUS | Status: DC | PRN
  Administered 2013-11-04: 10 mg via INTRAVENOUS

## 2013-11-04 MED ORDER — POVIDONE-IODINE 10 % OINT PACKET
TOPICAL_OINTMENT | CUTANEOUS | Status: DC | PRN
  Administered 2013-11-04: 2 via TOPICAL

## 2013-11-04 MED ORDER — ONDANSETRON HCL 4 MG/2ML IJ SOLN
INTRAMUSCULAR | Status: AC
  Filled 2013-11-04: qty 2

## 2013-11-04 MED ORDER — FENTANYL CITRATE 0.05 MG/ML IJ SOLN
INTRAMUSCULAR | Status: AC
  Filled 2013-11-04: qty 5

## 2013-11-04 MED ORDER — FENTANYL CITRATE 0.05 MG/ML IJ SOLN
25.0000 ug | INTRAMUSCULAR | Status: DC | PRN
  Administered 2013-11-04: 50 ug via INTRAVENOUS

## 2013-11-04 MED ORDER — GLYCOPYRROLATE 0.2 MG/ML IJ SOLN
INTRAMUSCULAR | Status: AC
  Filled 2013-11-04: qty 4

## 2013-11-04 MED ORDER — POVIDONE-IODINE 10 % EX OINT
TOPICAL_OINTMENT | CUTANEOUS | Status: AC
  Filled 2013-11-04: qty 1

## 2013-11-04 MED ORDER — FENTANYL CITRATE 0.05 MG/ML IJ SOLN
INTRAMUSCULAR | Status: AC
  Filled 2013-11-04: qty 2

## 2013-11-04 MED ORDER — GLYCOPYRROLATE 0.2 MG/ML IJ SOLN
INTRAMUSCULAR | Status: DC | PRN
  Administered 2013-11-04: 0.6 mg via INTRAVENOUS

## 2013-11-04 MED ORDER — CEFAZOLIN SODIUM-DEXTROSE 2-3 GM-% IV SOLR
2.0000 g | INTRAVENOUS | Status: AC
  Administered 2013-11-04: 2 g via INTRAVENOUS
  Filled 2013-11-04: qty 50

## 2013-11-04 MED ORDER — LIDOCAINE HCL (PF) 1 % IJ SOLN
INTRAMUSCULAR | Status: AC
  Filled 2013-11-04: qty 5

## 2013-11-04 MED ORDER — LACTATED RINGERS IV SOLN
INTRAVENOUS | Status: DC
  Administered 2013-11-04 (×2): via INTRAVENOUS

## 2013-11-04 MED ORDER — KETOROLAC TROMETHAMINE 30 MG/ML IJ SOLN
30.0000 mg | Freq: Once | INTRAMUSCULAR | Status: AC
  Administered 2013-11-04: 30 mg via INTRAVENOUS
  Filled 2013-11-04: qty 1

## 2013-11-04 MED ORDER — ACETAMINOPHEN 325 MG PO TABS: 650.0000 mg | ORAL_TABLET | Freq: Four times a day (QID) | ORAL | Status: DC | PRN

## 2013-11-04 MED ORDER — MIDAZOLAM HCL 2 MG/2ML IJ SOLN
INTRAMUSCULAR | Status: AC
  Filled 2013-11-04: qty 2

## 2013-11-04 MED ORDER — FENTANYL CITRATE 0.05 MG/ML IJ SOLN
INTRAMUSCULAR | Status: DC | PRN
  Administered 2013-11-04 (×2): 50 ug via INTRAVENOUS
  Administered 2013-11-04 (×2): 25 ug via INTRAVENOUS
  Administered 2013-11-04 (×2): 50 ug via INTRAVENOUS

## 2013-11-04 MED ORDER — ONDANSETRON HCL 4 MG/2ML IJ SOLN: 4.0000 mg | Freq: Four times a day (QID) | INTRAMUSCULAR | Status: DC | PRN

## 2013-11-04 MED ORDER — PROPOFOL 10 MG/ML IV BOLUS
INTRAVENOUS | Status: AC
  Filled 2013-11-04: qty 20

## 2013-11-04 MED ORDER — 0.9 % SODIUM CHLORIDE (POUR BTL) OPTIME
TOPICAL | Status: DC | PRN
  Administered 2013-11-04: 1000 mL

## 2013-11-04 MED ORDER — ONDANSETRON HCL 4 MG/2ML IJ SOLN
4.0000 mg | Freq: Once | INTRAMUSCULAR | Status: AC | PRN
  Administered 2013-11-04: 4 mg via INTRAVENOUS

## 2013-11-04 SURGICAL SUPPLY — 49 items
APPLIER CLIP 11 MED OPEN (CLIP)
APPLIER CLIP 9.375 SM OPEN (CLIP) ×2
APR CLP MED 11 20 MLT OPN (CLIP)
APR CLP SM 9.3 20 MLT OPN (CLIP) ×1
BAG HAMPER (MISCELLANEOUS) ×2 IMPLANT
CLIP APPLIE 11 MED OPEN (CLIP) IMPLANT
CLIP APPLIE 9.375 SM OPEN (CLIP) IMPLANT
CLOTH BEACON ORANGE TIMEOUT ST (SAFETY) ×2 IMPLANT
COVER LIGHT HANDLE STERIS (MISCELLANEOUS) ×4 IMPLANT
DRAPE PROXIMA HALF (DRAPES) IMPLANT
DRAPE UTILITY W/TAPE 26X15 (DRAPES) ×2 IMPLANT
DURAPREP 26ML APPLICATOR (WOUND CARE) ×2 IMPLANT
ELECT REM PT RETURN 9FT ADLT (ELECTROSURGICAL) ×2
ELECTRODE REM PT RTRN 9FT ADLT (ELECTROSURGICAL) ×1 IMPLANT
EVACUATOR DRAINAGE 10X20 100CC (DRAIN) ×1 IMPLANT
EVACUATOR SILICONE 100CC (DRAIN) ×2
GAUZE SPONGE 4X4 12PLY STRL (GAUZE/BANDAGES/DRESSINGS) ×2 IMPLANT
GLOVE BIO SURGEON STRL SZ 6.5 (GLOVE) ×2 IMPLANT
GLOVE BIOGEL PI IND STRL 7.0 (GLOVE) IMPLANT
GLOVE BIOGEL PI IND STRL 7.5 (GLOVE) IMPLANT
GLOVE BIOGEL PI INDICATOR 7.0 (GLOVE) ×2
GLOVE BIOGEL PI INDICATOR 7.5 (GLOVE) ×1
GLOVE SURG SS PI 7.5 STRL IVOR (GLOVE) ×2 IMPLANT
GOWN STRL REUS W/TWL LRG LVL3 (GOWN DISPOSABLE) ×6 IMPLANT
INST SET MINOR GENERAL (KITS) ×2 IMPLANT
KIT ROOM TURNOVER APOR (KITS) ×2 IMPLANT
MANIFOLD NEPTUNE II (INSTRUMENTS) ×2 IMPLANT
NS IRRIG 1000ML POUR BTL (IV SOLUTION) ×2 IMPLANT
PACK MINOR (CUSTOM PROCEDURE TRAY) ×2 IMPLANT
PAD ABD 5X9 TENDERSORB (GAUZE/BANDAGES/DRESSINGS) ×1 IMPLANT
PAD ARMBOARD 7.5X6 YLW CONV (MISCELLANEOUS) ×2 IMPLANT
SET BASIN LINEN APH (SET/KITS/TRAYS/PACK) ×2 IMPLANT
SPONGE DRAIN TRACH 4X4 STRL 2S (GAUZE/BANDAGES/DRESSINGS) ×2 IMPLANT
SPONGE INTESTINAL PEANUT (DISPOSABLE) IMPLANT
SPONGE LAP 18X18 X RAY DECT (DISPOSABLE) ×3 IMPLANT
STAPLER VISISTAT (STAPLE) ×2 IMPLANT
STOCKINETTE IMPERVIOUS LG (DRAPES) ×1 IMPLANT
SUT ETHILON 3 0 FSL (SUTURE) ×1 IMPLANT
SUT SILK 2 0 (SUTURE) ×2
SUT SILK 2 0 SH (SUTURE) ×2 IMPLANT
SUT SILK 2-0 18XBRD TIE 12 (SUTURE) ×1 IMPLANT
SUT VIC AB 2-0 CT1 27 (SUTURE) ×4
SUT VIC AB 2-0 CT1 TAPERPNT 27 (SUTURE) ×3 IMPLANT
SUT VIC AB 2-0 CT2 27 (SUTURE) ×5 IMPLANT
SUT VIC AB 3-0 SH 27 (SUTURE) ×2
SUT VIC AB 3-0 SH 27X BRD (SUTURE) IMPLANT
SUT VICRYL AB 2 0 TIES (SUTURE) IMPLANT
SYR BULB IRRIGATION 50ML (SYRINGE) ×1 IMPLANT
TAPE CLOTH SURG 4X10 WHT LF (GAUZE/BANDAGES/DRESSINGS) ×1 IMPLANT

## 2013-11-04 NOTE — Interval H&P Note (Signed)
History and Physical Interval Note:  11/04/2013 10:38 AM  Tiffany Sweeney  has presented today for surgery, with the diagnosis of left breast cancer  The various methods of treatment have been discussed with the patient and family. After consideration of risks, benefits and other options for treatment, the patient has consented to  Procedure(s): MASTECTOMY MODIFIED RADICAL (Left) as a surgical intervention .  The patient's history has been reviewed, patient examined, no change in status, stable for surgery.  I have reviewed the patient's chart and labs.  Questions were answered to the patient's satisfaction.     Aviva Signs A

## 2013-11-04 NOTE — Anesthesia Preprocedure Evaluation (Signed)
Anesthesia Evaluation  Patient identified by MRN, date of birth, ID band Patient awake    Reviewed: Allergy & Precautions, H&P , NPO status , Patient's Chart, lab work & pertinent test results, reviewed documented beta blocker date and time   Airway Mallampati: II  TM Distance: >3 FB     Dental  (+) Edentulous Upper, Partial Lower   Pulmonary former smoker,  breath sounds clear to auscultation        Cardiovascular hypertension, Pt. on home beta blockers and Pt. on medications Rhythm:Regular Rate:Normal     Neuro/Psych PSYCHIATRIC DISORDERS Anxiety    GI/Hepatic negative GI ROS,   Endo/Other    Renal/GU      Musculoskeletal   Abdominal   Peds  Hematology   Anesthesia Other Findings   Reproductive/Obstetrics                             Anesthesia Physical Anesthesia Plan  ASA: II  Anesthesia Plan: General   Post-op Pain Management:    Induction: Intravenous  Airway Management Planned: Oral ETT  Additional Equipment:   Intra-op Plan:   Post-operative Plan: Extubation in OR  Informed Consent: I have reviewed the patients History and Physical, chart, labs and discussed the procedure including the risks, benefits and alternatives for the proposed anesthesia with the patient or authorized representative who has indicated his/her understanding and acceptance.     Plan Discussed with:   Anesthesia Plan Comments:         Anesthesia Quick Evaluation

## 2013-11-04 NOTE — Transfer of Care (Signed)
Immediate Anesthesia Transfer of Care Note  Patient: Tiffany Sweeney  Procedure(s) Performed: Procedure(s) (LRB): LEFT MASTECTOMY MODIFIED RADICAL (Left)  Patient Location: PACU  Anesthesia Type: General  Level of Consciousness: awake  Airway & Oxygen Therapy: Patient Spontanous Breathing and non-rebreather face mask  Post-op Assessment: Report given to PACU RN, Post -op Vital signs reviewed and stable and Patient moving all extremities  Post vital signs: Reviewed and stable  Complications: No apparent anesthesia complications

## 2013-11-04 NOTE — Anesthesia Postprocedure Evaluation (Signed)
  Anesthesia Post-op Note  Patient: Tiffany Sweeney  Procedure(s) Performed: Procedure(s): LEFT MASTECTOMY MODIFIED RADICAL (Left)  Patient Location: PACU  Anesthesia Type:General  Level of Consciousness: patient cooperative  Airway and Oxygen Therapy: Patient Spontanous Breathing and Patient connected to nasal cannula oxygen  Post-op Pain: mild  Post-op Assessment: Post-op Vital signs reviewed, Patient's Cardiovascular Status Stable, Respiratory Function Stable and Patent Airway  Post-op Vital Signs: Reviewed and stable  Last Vitals:  Filed Vitals:   11/04/13 1245  BP: 157/68  Pulse: 64  Temp:   Resp: 17    Complications: No apparent anesthesia complications

## 2013-11-04 NOTE — Anesthesia Procedure Notes (Signed)
Procedure Name: Intubation Date/Time: 11/04/2013 11:18 AM Performed by: Vista Deck Pre-anesthesia Checklist: Patient identified, Patient being monitored, Timeout performed, Emergency Drugs available and Suction available Patient Re-evaluated:Patient Re-evaluated prior to inductionOxygen Delivery Method: Circle System Utilized Preoxygenation: Pre-oxygenation with 100% oxygen Intubation Type: IV induction Ventilation: Mask ventilation without difficulty and Oral airway inserted - appropriate to patient size Laryngoscope Size: Sabra Heck and 2 Grade View: Grade I Tube type: Oral Tube size: 7.0 mm Number of attempts: 1 Airway Equipment and Method: stylet Placement Confirmation: ETT inserted through vocal cords under direct vision,  positive ETCO2 and breath sounds checked- equal and bilateral Secured at: 21 cm Tube secured with: Tape Dental Injury: Teeth and Oropharynx as per pre-operative assessment

## 2013-11-04 NOTE — Progress Notes (Signed)
From OR. Moaning/groaning. Oriented to place per nurse. Med per anesthesia.

## 2013-11-04 NOTE — Progress Notes (Signed)
Dentures returned to pt 

## 2013-11-04 NOTE — Op Note (Signed)
Patient:  Tiffany Sweeney  DOB:  Jun 25, 1934  MRN:  [id number]   Preop Diagnosis:  Left breast carcinoma  Postop Diagnosis:  same  Procedure:  Left modified radical mastectomy  Surgeon:  Aviva Signs, M.D.  Anes:  General endotracheal  Indications:   Patient is a 79 year old white female status post a right modified radical mastectomy in the remote past who now presents with a new invasiveductal carcinoma the left breast. After extensive discussion with the patient, she elected to proceed with a left modified radical mastectomy. The risks and benefits of the procedure including bleeding, infection, nerve injury, the possibility of needing a blood transfusion, and the possibility of having left arm swelling were fully explained to the patient, who gave informed consent.  Procedure note:  The patient was placed the supine position. After induction of general endotracheal anesthesia, the left breast and axilla were prepped and draped using usual sterile technique with DuraPrep. Surgical site confirmation was performed.  Elliptical incision was made medial to lateral around the left nipple. A superior flap was then formed to the clavicle. An inferior flap formed to the chest wall. The breast was then removed along with the fascia off the chest wall using Bovie electrode cautery, medial to lateral. A short suture was placed superiorly and a long suture placed laterally for orientation purposes. A level II axillary dissection was performed. Care was taken to avoid the long thoracic nerve, thoracic or dorsal artery vein and nerve, and the intercostal brachial nerve. Several small lymph nodes were noted. The left breast and axillary contents were then removed from the operative field and sent to pathology further examination. A bleeding was controlled using small clips in the left axilla. A #10 flat Jackson-Pratt drain was then placed along the flap and brought through separate stab wound inferior to the  incision line. It was secured at the skin level using 3-0 nylon interrupted suture. The subcutaneous layer was reapproximated using a 2-0 Vicryl interrupted suture. The skin was closed using staples. Betadine ointment and a dry sterile dressing was applied.  All tape and needle counts were correct at the end of the procedure. Patient was extubated in the operating room and transferred to PACU in stable condition.  Complications:  none  EBL:  50 mL  Specimen:  Left breast and axillary contents, short suture superior, long suture lateral  Drains: JP drain to left mastectomy flap

## 2013-11-05 DIAGNOSIS — C50912 Malignant neoplasm of unspecified site of left female breast: Secondary | ICD-10-CM | POA: Diagnosis not present

## 2013-11-05 LAB — CBC
HCT: 33.9 % — ABNORMAL LOW (ref 36.0–46.0)
Hemoglobin: 11.4 g/dL — ABNORMAL LOW (ref 12.0–15.0)
MCH: 29.8 pg (ref 26.0–34.0)
MCHC: 33.6 g/dL (ref 30.0–36.0)
MCV: 88.7 fL (ref 78.0–100.0)
PLATELETS: 225 10*3/uL (ref 150–400)
RBC: 3.82 MIL/uL — ABNORMAL LOW (ref 3.87–5.11)
RDW: 13.3 % (ref 11.5–15.5)
WBC: 7.1 10*3/uL (ref 4.0–10.5)

## 2013-11-05 LAB — BASIC METABOLIC PANEL
ANION GAP: 9 (ref 5–15)
BUN: 10 mg/dL (ref 6–23)
CALCIUM: 8.4 mg/dL (ref 8.4–10.5)
CO2: 30 mEq/L (ref 19–32)
CREATININE: 0.84 mg/dL (ref 0.50–1.10)
Chloride: 103 mEq/L (ref 96–112)
GFR, EST AFRICAN AMERICAN: 76 mL/min — AB (ref >90)
GFR, EST NON AFRICAN AMERICAN: 65 mL/min — AB (ref >90)
Glucose, Bld: 121 mg/dL — ABNORMAL HIGH (ref 70–99)
Potassium: 3.6 mEq/L — ABNORMAL LOW (ref 3.7–5.3)
Sodium: 142 mEq/L (ref 137–147)

## 2013-11-05 NOTE — Evaluation (Signed)
Physical Therapy Evaluation Patient Details Name: Tiffany Sweeney MRN: [id number] DOB: November 27, 1934 Today's Date: 11/05/2013   History of Present Illness  Pt is a 79 year old female who presents with left breast cancer found on routine mammography.  Biopsy positive for invasive ductal carcinoma.  ER/PR negative.  s/p right modified radical mastectomy in 1997.  No family h/o breast cancer noted.  Pt underwent Lt modified radical masectomy on 11/04/13.   Clinical Impression  Pt is a 79 year old female who underwent Lt modified radical masectomy on 11/04/13; MD orders for therapeutic exercise program for Lt UE.   When PT entered room, pt was walking in room to the bathroom.  Noted supervision with transfers and gait in room from son, and mod assist from PT to get back in bed.  Pt reports no complaints of pain at this time.  Completed exercise program for ROM and to increase use of hand/elbow of the Lt UE.  Educated pt on progression of therapeutic exercise program, and keys to watch out for in regards to ROM, strength, and swelling.  Educated pt to continue with HEP PT will develop, though she may require OPPT or OPOT services in the future if needed.  No DME recommendations.  Will leave PT case open, in case pt has questions regarding HEP.      Follow Up Recommendations No PT follow up    Equipment Recommendations  None recommended by PT       Precautions / Restrictions Precautions Precautions: Fall Restrictions Weight Bearing Restrictions: No      Mobility  Bed Mobility Overal bed mobility: Needs Assistance Bed Mobility: Sit to Supine       Sit to supine: Mod assist (Mod assist for LE)      Transfers Overall transfer level: Needs assistance   Transfers: Sit to/from Stand Sit to Stand: Supervision            Ambulation/Gait Ambulation/Gait assistance: Supervision Ambulation Distance (Feet): 10 Feet         General Gait Details: 10 feet x2.             Pertinent Vitals/Pain Pain Assessment: No/denies pain    Home Living Family/patient expects to be discharged to:: Private residence Living Arrangements: Alone                     Extremity/Trunk Assessment   Upper Extremity Assessment: LUE deficits/detail       LUE Deficits / Details: Limited ROM of Lt shoulder secondary to recent surgical procedure.  Bandages covering incision from surgery.                 Cognition Arousal/Alertness: Awake/alert Behavior During Therapy: WFL for tasks assessed/performed Overall Cognitive Status: Within Functional Limits for tasks assessed                         Exercises General Exercises - Upper Extremity Shoulder Flexion: AAROM;5 reps (with wand) Elbow Flexion: AROM;Left;10 reps Elbow Extension: AROM;Left;10 reps Other Exercises Other Exercises: Chest press with cane, x5 Other Exercises: Shoulder IR/ER with cane, x5 Other Exercises: Towel Squeeze, Lt hand x10      Assessment/Plan    PT Assessment Patient needs continued PT services  PT Diagnosis Other (comment) (Decreased ROM)   PT Problem List Decreased range of motion  PT Treatment Interventions Therapeutic exercise   PT Goals (Current goals can be found in the Care Plan section) Acute Rehab PT Goals  Patient Stated Goal: none stated PT Goal Formulation: With patient/family    Frequency Other (Comment) (To assess as needed for education on HEP)    End of Session   Activity Tolerance: Patient tolerated treatment well Patient left: in bed;with call bell/phone within reach;with family/visitor present      Functional Limitation: Carrying, moving and handling objects Carrying, Moving and Handling Objects Current Status (Z6109): At least 80 percent but less than 100 percent impaired, limited or restricted Carrying, Moving and Handling Objects Goal Status [phone]): At least 20 percent but less than 40 percent impaired, limited or restricted     Time: 1100-1130 PT Time Calculation (min): 30 min   Charges:   PT Evaluation $Initial PT Evaluation Tier I: 1 Procedure PT Treatments $Therapeutic Exercise: 8-22 mins   PT G Codes:     Functional Limitation: Carrying, moving and handling objects    Ginette Bradway 11/05/2013, 1:26 PM

## 2013-11-05 NOTE — Progress Notes (Signed)
1 Day Post-Op  Subjective: Having some left shoulder and incisional pain. Trying to control this with by mouth pain meds.  Objective: Vital signs in last 24 hours: Temp:  [97.5 F (36.4 C)-98.3 F (36.8 C)] 98.3 F (36.8 C) (11/03 0615) Pulse Rate:  [49-66] 56 (11/03 0615) Resp:  [9-23] 16 (11/03 0615) BP: (124-176)/(55-93) 152/75 mmHg (11/03 0615) SpO2:  [95 %-100 %] 95 % (11/03 0615) Weight:  [71.668 kg (158 lb)] 71.668 kg (158 lb) (11/02 1700) Last BM Date: 11/03/13  Intake/Output from previous day: 11/02 0701 - 11/03 0700 In: 2597.5 [P.O.:240; I.V.:2357.5] Out: 765 [Urine:600; Drains:115; Blood:50] Intake/Output this shift:    General appearance: alert, cooperative and no distress Resp: clear to auscultation bilaterally Breasts: dressing dry and intact. JP drainage sanguinous in nature. Cardio: regular rate and rhythm, S1, S2 normal, no murmur, click, rub or gallop  Lab Results:   Recent Labs  11/05/13 0645  WBC 7.1  HGB 11.4*  HCT 33.9*  PLT 225   BMET  Recent Labs  11/05/13 0645  NA 142  K 3.6*  CL 103  CO2 30  GLUCOSE 121*  BUN 10  CREATININE 0.84  CALCIUM 8.4   PT/INR No results for input(s): LABPROT, INR in the last 72 hours.  Studies/Results: No results found.  Anti-infectives: Anti-infectives    Start     Dose/Rate Route Frequency Ordered Stop   11/04/13 0957  ceFAZolin (ANCEF) IVPB 2 g/50 mL premix     2 g100 mL/hr over 30 Minutes Intravenous On call to O.R. 11/04/13 0957 11/04/13 1114      Assessment/Plan: s/p Procedure(s): LEFT MASTECTOMY MODIFIED RADICAL impression: Stable on postoperative day one. Is having incisional pain.  Plan: We'll try to switch to by mouth pain medications. Physical therapy consulted for range of motion exercises of left arm. Anticipate discharge in next 24-48 hours.  LOS: 1 day    Kimothy Kishimoto A 11/05/2013

## 2013-11-05 NOTE — Care Management Note (Unsigned)
    Page 1 of 1   11/05/2013     5:21:03 PM CARE MANAGEMENT NOTE 11/05/2013  Patient:  Tiffany Sweeney, Tiffany Sweeney   Account Number:  [id number]  Date Initiated:  11/05/2013  Documentation initiated by:  Vladimir Creeks  Subjective/Objective Assessment:   Admitted after mod radical mastectomy. Pt is from home, alone, and is independent, with good support from her children. Pt and son, visiting in room would like a [place] aide and RN for a short time to monitor drain, incision, vitals, etc. for     Action/Plan:   the first week home. Will speak with MD for orders,  and set up [facility]. She expects to D/C in AM   Anticipated DC Date:  11/06/2013   Anticipated DC Plan:  [place]  CM consult      [facility] Choice  HOME HEALTH   Choice offered to / List presented to:  C-1 Patient        [place] arranged  HH-1 RN  [place].   Status of service:  In process, will continue to follow Medicare Important Message given?   (If response is "NO", the following Medicare IM given date fields will be blank) Date Medicare IM given:   Medicare IM given by:   Date Additional Medicare IM given:   Additional Medicare IM given by:    Discharge Disposition:    Per UR Regulation:  Reviewed for med. necessity/level of care/duration of stay  If discussed at [place] of Stay Meetings, dates discussed:    Comments:  11/05/13 [place] RN/CM

## 2013-11-05 NOTE — Addendum Note (Signed)
Addendum  created 11/05/13 0850 by Vista Deck, CRNA   Modules edited: Notes Section   Notes Section:  File: [id number]

## 2013-11-05 NOTE — Anesthesia Postprocedure Evaluation (Signed)
  Anesthesia Post-op Note  Patient: Tiffany Sweeney  Procedure(s) Performed: Procedure(s): LEFT MASTECTOMY MODIFIED RADICAL (Left)  Patient Location: Nursing Unit  Anesthesia Type:General  Level of Consciousness: awake and patient cooperative  Airway and Oxygen Therapy: Patient Spontanous Breathing  Post-op Pain: mild  Post-op Assessment: Post-op Vital signs reviewed, Patient's Cardiovascular Status Stable, Respiratory Function Stable, Patent Airway, No signs of Nausea or vomiting and Pain level controlled  Post-op Vital Signs: Reviewed and stable  Last Vitals:  Filed Vitals:   11/05/13 0615  BP: 152/75  Pulse: 56  Temp: 36.8 C  Resp: 16    Complications: No apparent anesthesia complications

## 2013-11-05 NOTE — Care Management Utilization Note (Signed)
UR completed 

## 2013-11-05 NOTE — Progress Notes (Signed)
PT NOTE   Created HEP for patient for improvement of ROM of the Lt shoulder, and use of the Lt UE.  Educated pt on how to progress program and increase reps as tolerated.  Gave pt HEP to progress with exercises in the next month.  Educated pt to continue to assess ROM, swelling, and strength of the Lt UE as pt may need outpatient services in the future if needed.  Informed pt to review HEP this evening, and to call for PT tomorrow if she has questions/concerns.       Lonna Cobb, PT, DPT 11/05/13 13:30

## 2013-11-06 ENCOUNTER — Encounter (HOSPITAL_COMMUNITY): Payer: Self-pay | Admitting: General Surgery

## 2013-11-06 DIAGNOSIS — C50912 Malignant neoplasm of unspecified site of left female breast: Secondary | ICD-10-CM | POA: Diagnosis not present

## 2013-11-06 MED ORDER — HYDROCODONE-ACETAMINOPHEN 5-325 MG PO TABS: 1.0000 | ORAL_TABLET | ORAL | Status: DC | PRN

## 2013-11-06 NOTE — Discharge Planning (Signed)
Pt stated she was ready to be DC home and was given pain meds just before leaving.  Pt's IV removed and educated about s/sx of infection, drain care and bathing safety.  Pt told that [facility] would be coming to house and how to care for her wound.  Pt educated that if she has a fever to call Doctor.  Given script and pt wheeled to car by student and family.

## 2013-11-06 NOTE — Discharge Instructions (Signed)
Mastectomy, With or Without Reconstruction Mastectomy (removal of the breast) is a procedure most commonly used to treat cancer (tumor) of the breast. Different procedures are available for treatment. This depends on the stage of the tumor (abnormal growths). Discuss this with your caregiver, surgeon (a specialist for performing operations such as this), or oncologist (someone specialized in the treatment of cancer). With proper information, you can decide which treatment is best for you. Although the sound of the word cancer is frightening to all of [place], the new treatments and medications can be a source of reassurance and comfort. If there are things you are worried about, discuss them with your caregiver. He or she can help comfort you and your family. Some of the different procedures for treating breast cancer are:  Radical (extensive) mastectomy. This is an operation used to remove the entire breast, the muscles under the breast, and all of the glands (lymph nodes) under the arm. With all of the new treatments available for cancer of the breast, this procedure has become less common.  Modified radical mastectomy. This is a similar operation to the radical mastectomy described above. In the modified radical mastectomy, the muscles of the chest wall are not removed unless one of the lessor muscles is removed. One of the lessor muscles may be removed to allow better removal of the lymph nodes. The axillary lymph nodes are also removed. Rarely, during an axillary node dissection nerves to this area are damaged. Radiation therapy is then often used to the area following this surgery.  A total mastectomy also known as a complete or simple mastectomy. It involves removal of only the breast. The lymph nodes and the muscles are left in place.  In a lumpectomy, the lump is removed from the breast. This is the simplest form of surgical treatment. A sentinel lymph node biopsy may also be done. Additional treatment  may be required. RISKS AND COMPLICATIONS The main problems that follow removal of the breast include:  Infection (germs start growing in the wound). This can usually be treated with antibiotics (medications that kill germs).  Lymphedema. This means the arm on the side of the breast that was operated on swells because the lymph (tissue fluid) cannot follow the main channels back into the body. This only occurs when the lymph nodes have had to be removed under the arm.  There may be some areas of numbness to the upper arm and around the incision (cut by the surgeon) in the breast. This happens because of the cutting of or damage to some of the nerves in the area. This is most often unavoidable.  There may be difficulty moving the arm in a full range of motion (moving in all directions) following surgery. This usually improves with time following use and exercise.  Recurrence of breast cancer may happen with the very best of surgery and follow up treatment. Sometimes small cancer cells that cannot be seen with the naked eye have already spread at the time of surgery. When this happens other treatment is available. This treatment may be radiation, medications or a combination of both. RECONSTRUCTION Reconstruction of the breast may be done immediately if there is not going to be post-operative radiation. This surgery is done for cosmetic (improve appearance) purposes to improve the physical appearance after the operation. This may be done in two ways:  It can be done using a saline filled prosthetic (an artificial breast which is filled with salt water). Silicone breast implants are now  re-approved by the FDA and are being commonly used.  Reconstruction can be done using the body's own muscle/fat/skin. Your caregiver will discuss your options with you. Depending upon your needs or choice, together you will be able to determine which procedure is best for you. Document Released: 09/14/2000 Document  Revised: 09/14/2011 Document Reviewed: 05/08/2007 [facility] Patient Information 2015 [place], [place]. This information is not intended to replace advice given to you by your health care provider. Make sure you discuss any questions you have with your health care provider. Bulb Drain Home Care A bulb drain consists of a thin rubber tube and a soft, round bulb that creates a gentle suction. The rubber tube is placed in the area where you had surgery. A bulb is attached to the end of the tube that is outside the body. The bulb drain removes excess fluid that normally builds up in a surgical wound after surgery. The color and amount of fluid will vary. Immediately after surgery, the fluid is bright red and is a little thicker than water. It may gradually change to a yellow or pink color and become more thin and water-like. When the amount decreases to about 1 or 2 tbsp in 24 hours, your health care provider will usually remove it. DAILY CARE  Keep the bulb flat (compressed) at all times, except while emptying it. The flatness creates suction. You can flatten the bulb by squeezing it firmly in the middle and then closing the cap.  Keep sites where the tube enters the skin dry and covered with a bandage (dressing).  Secure the tube 1-2 in (2.5-5.1 cm) below the insertion sites to keep it from pulling on your stitches. The tube is stitched in place and will not slip out.  Secure the bulb as directed by your health care provider.  For the first 3 days after surgery, there usually is more fluid in the bulb. Empty the bulb whenever it becomes half full because the bulb does not create enough suction if it is too full. The bulb could also overflow. Write down how much fluid you remove each time you empty your drain. Add up the amount removed in 24 hours.  Empty the bulb at the same time every day once the amount of fluid decreases and you only need to empty it once a day. Write down the amounts and the 24-hour  totals to give to your health care provider. This helps your health care provider know when the tubes can be removed. EMPTYING THE BULB DRAIN Before emptying the bulb, get a measuring cup, a piece of paper and a pen, and wash your hands.  Gently run your fingers down the tube (stripping) to empty any drainage from the tubing into the bulb. This may need to be done several times a day to clear the tubing of clots and tissue.  Open the bulb cap to release suction, which causes it to inflate. Do not touch the inside of the cap.  Gently run your fingers down the tube (stripping) to empty any drainage from the tubing into the bulb.  Hold the cap out of the way, and pour fluid into the measuring cup.   Squeeze the bulb to provide suction.  Replace the cap.   Check the tape that holds the tube to your skin. If it is becoming loose, you can remove the loose piece of tape and apply a new one. Then, pin the bulb to your shirt.   Write down the amount of  fluid you emptied out. Write down the date and each time you emptied your bulb drain. (If there are 2 bulbs, note the amount of drainage from each bulb and keep the totals separate. Your health care provider will want to know the total amounts for each drain and which tube is draining more.)   Flush the fluid down the toilet and wash your hands.   Call your health care provider once you have less than 2 tbsp of fluid collecting in the bulb drain every 24 hours. If there is drainage around the tube site, change dressings and keep the area dry. Cleanse around tube with sterile saline and place dry gauze around site. This gauze should be changed when it is soiled. If it stays clean and unsoiled, it should still be changed daily.  SEEK MEDICAL CARE IF:  Your drainage has a bad smell or is cloudy.   You have a fever.   Your drainage is increasing instead of decreasing.   Your tube fell out.   You have redness or swelling around the tube  site.   You have drainage from a surgical wound.   Your bulb drain will not stay flat after you empty it.  MAKE SURE YOU:   Understand these instructions.  Will watch your condition.  Will get help right away if you are not doing well or get worse. Document Released: 12/18/1999 Document Revised: 05/06/2013 Document Reviewed: 05/25/2011 [facility] Patient Information 2015 [place], [place]. This information is not intended to replace advice given to you by your health care provider. Make sure you discuss any questions you have with your health care provider.

## 2013-11-06 NOTE — Discharge Summary (Signed)
Physician Discharge Summary  Patient ID: Tiffany Sweeney MRN: [id number] DOB/AGE: April 27, 1934 79 y.o.  Admit date: 11/04/2013 Discharge date: 11/06/2013  Admission Diagnoses: left breast carcinoma  Discharge Diagnoses: same Active Problems:   Breast cancer   Discharged Condition: good  Hospital Course: patient is a 79 year old white female who was recently diagnosed with invasive ductal carcinoma left breast. After extensive discussion with the patient, she elected to proceed with a left modified radical mastectomy. She had undergone a right modified radical mastectomy in the remote past for breast cancer. She tolerated the procedure well. It was performed on 11/04/2013. Her postoperative course has been unremarkable. Her diet was advanced without difficulty. Her JP drainage has been sanguinous in nature and decreasing.final pathology is pending. She is being discharged home on 11/06/2013 in good improving condition.  Treatments: surgery: left modified radical mastectomy on 11/04/2013.  Discharge Exam: Blood pressure 164/64, pulse 82, temperature 98.9 F (37.2 C), temperature source Oral, resp. rate 20, height 5\' 4"  (1.626 m), weight 77.928 kg (171 lb 12.8 oz), SpO2 93 %. General appearance: alert, cooperative and no distress Resp: clear to auscultation bilaterally Breasts: left breast incision healing well. Cardio: regular rate and rhythm, S1, S2 normal, no murmur, click, rub or gallop  Disposition: 01-Home or Self Care     Medication List    TAKE these medications        atenolol 50 MG tablet  Commonly known as:  TENORMIN  Take 50 mg by mouth daily.     HYDROcodone-acetaminophen 5-325 MG per tablet  Commonly known as:  NORCO/VICODIN  Take 1-2 tablets by mouth every 4 (four) hours as needed for moderate pain.     LORazepam 0.5 MG tablet  Commonly known as:  ATIVAN  Take 0.5 mg by mouth daily.           Follow-up Information    Follow up with Jamesetta So, MD.  Schedule an appointment as soon as possible for a visit on 11/12/2013.   Specialty:  General Surgery   Contact information:   [address] [address] [phone]       Signed: Aviva Signs A 11/06/2013, 3:38 PM

## 2013-11-11 LAB — TYPE AND SCREEN
ABO/RH(D): A POS
Antibody Screen: NEGATIVE

## 2013-11-13 ENCOUNTER — Telehealth: Payer: Self-pay | Admitting: Hematology & Oncology

## 2013-11-13 NOTE — Telephone Encounter (Signed)
Pt left message wanting to schedule appointment. First time I called phone was busy. Second time no answer or voice mail.

## 2013-11-19 ENCOUNTER — Encounter (HOSPITAL_COMMUNITY): Payer: Medicare Other | Attending: Hematology and Oncology

## 2013-11-19 ENCOUNTER — Encounter (HOSPITAL_COMMUNITY): Payer: Self-pay

## 2013-11-19 ENCOUNTER — Telehealth (HOSPITAL_COMMUNITY): Payer: Self-pay

## 2013-11-19 VITALS — BP 153/68 | HR 67 | Temp 97.9°F | Resp 18 | Ht 64.0 in | Wt 157.5 lb

## 2013-11-19 DIAGNOSIS — M810 Age-related osteoporosis without current pathological fracture: Secondary | ICD-10-CM | POA: Insufficient documentation

## 2013-11-19 DIAGNOSIS — C50911 Malignant neoplasm of unspecified site of right female breast: Secondary | ICD-10-CM

## 2013-11-19 DIAGNOSIS — Z171 Estrogen receptor negative status [ER-]: Secondary | ICD-10-CM

## 2013-11-19 DIAGNOSIS — I10 Essential (primary) hypertension: Secondary | ICD-10-CM | POA: Diagnosis not present

## 2013-11-19 DIAGNOSIS — C50912 Malignant neoplasm of unspecified site of left female breast: Secondary | ICD-10-CM | POA: Diagnosis not present

## 2013-11-19 DIAGNOSIS — Z853 Personal history of malignant neoplasm of breast: Secondary | ICD-10-CM

## 2013-11-19 DIAGNOSIS — Z170 Estrogen receptor positive status [ER+]: Secondary | ICD-10-CM | POA: Insufficient documentation

## 2013-11-19 LAB — CBC WITH DIFFERENTIAL/PLATELET
BASOS PCT: 0 % (ref 0–1)
Basophils Absolute: 0 10*3/uL (ref 0.0–0.1)
Eosinophils Absolute: 0.2 10*3/uL (ref 0.0–0.7)
Eosinophils Relative: 2 % (ref 0–5)
HCT: 38.7 % (ref 36.0–46.0)
Hemoglobin: 12.8 g/dL (ref 12.0–15.0)
Lymphocytes Relative: 31 % (ref 12–46)
Lymphs Abs: 2.2 10*3/uL (ref 0.7–4.0)
MCH: 29.7 pg (ref 26.0–34.0)
MCHC: 33.1 g/dL (ref 30.0–36.0)
MCV: 89.8 fL (ref 78.0–100.0)
Monocytes Absolute: 0.6 10*3/uL (ref 0.1–1.0)
Monocytes Relative: 8 % (ref 3–12)
NEUTROS ABS: 4.1 10*3/uL (ref 1.7–7.7)
NEUTROS PCT: 59 % (ref 43–77)
PLATELETS: 304 10*3/uL (ref 150–400)
RBC: 4.31 MIL/uL (ref 3.87–5.11)
RDW: 13.7 % (ref 11.5–15.5)
WBC: 7.1 10*3/uL (ref 4.0–10.5)

## 2013-11-19 LAB — COMPREHENSIVE METABOLIC PANEL
ALBUMIN: 3.7 g/dL (ref 3.5–5.2)
ALT: 10 U/L (ref 0–35)
AST: 18 U/L (ref 0–37)
Alkaline Phosphatase: 84 U/L (ref 39–117)
Anion gap: 11 (ref 5–15)
BILIRUBIN TOTAL: 0.5 mg/dL (ref 0.3–1.2)
BUN: 9 mg/dL (ref 6–23)
CHLORIDE: 101 meq/L (ref 96–112)
CO2: 30 mEq/L (ref 19–32)
Calcium: 9 mg/dL (ref 8.4–10.5)
Creatinine, Ser: 0.79 mg/dL (ref 0.50–1.10)
GFR calc Af Amer: >90 mL/min (ref >90)
GFR calc non Af Amer: 78 mL/min — ABNORMAL LOW (ref >90)
Glucose, Bld: 108 mg/dL — ABNORMAL HIGH (ref 70–99)
POTASSIUM: 3.6 meq/L — AB (ref 3.7–5.3)
SODIUM: 142 meq/L (ref 137–147)
Total Protein: 7 g/dL (ref 6.0–8.3)

## 2013-11-19 NOTE — Patient Instructions (Signed)
..  [facility] Discharge Instructions  RECOMMENDATIONS MADE BY THE CONSULTANT AND ANY TEST RESULTS WILL BE SENT TO YOUR REFERRING PHYSICIAN.  EXAM FINDINGS BY THE PHYSICIAN TODAY AND SIGNS OR SYMPTOMS TO REPORT TO CLINIC OR PRIMARY PHYSICIAN: Exam and findings as discussed by Dr. Barnet Glasgow. We will request  special foundation one testing on your breast tissue specimen Treatment would be with Taxotere Cytoxan Dr. Arnoldo Morale will put port in for you INSTRUCTIONS/FOLLOW-UP: December 21st   Thank you for choosing [facility] to provide your oncology and hematology care.  To afford each patient quality time with our providers, please arrive at least 15 minutes before your scheduled appointment time.  With your help, our goal is to use those 15 minutes to complete the necessary work-up to ensure our physicians have the information they need to help with your evaluation and healthcare recommendations.    Effective January 1st, 2014, we ask that you re-schedule your appointment with our physicians should you arrive 10 or more minutes late for your appointment.  We strive to give you quality time with our providers, and arriving late affects you and other patients whose appointments are after yours.    Again, thank you for choosing [facility].  Our hope is that these requests will decrease the amount of time that you wait before being seen by our physicians.       _____________________________________________________________  Should you have questions after your visit to [facility], please contact our office at [phone] between the hours of 8:30 a.m. and 4:30 p.m.  Voicemails left after 4:30 p.m. will not be returned until the following business day.  For prescription refill requests, have your pharmacy contact our office with your prescription refill request.    _______________________________________________________________  We hope that  we have given you very good care.  You may receive a patient satisfaction survey in the mail, please complete it and return it as soon as possible.  We value your feedback!  _______________________________________________________________  Have you asked about our STAR program?  STAR stands for Survivorship Training and Rehabilitation, and this is a nationally recognized cancer care program that focuses on survivorship and rehabilitation.  Cancer and cancer treatments may cause problems, such as, pain, making you feel tired and keeping you from doing the things that you need or want to do. Cancer rehabilitation can help. Our goal is to reduce these troubling effects and help you have the best quality of life possible.  You may receive a survey from a nurse that asks questions about your current state of health.  Based on the survey results, all eligible patients will be referred to the [facility] program for an evaluation so we can better serve you!  A frequently asked questions sheet is available upon request.

## 2013-11-19 NOTE — Telephone Encounter (Signed)
Foundation One testing to include the androgen piece, requested on tissue sample from 11/04/13.

## 2013-11-19 NOTE — Progress Notes (Signed)
Tiffany Sweeney presented for labwork. Labs per MD order drawn via Peripheral Line 25 gauge needle inserted in rt ac.  Good blood return present. Procedure without incident.  Needle removed intact. Patient tolerated procedure well.

## 2013-11-19 NOTE — Progress Notes (Signed)
[place] A. Barnet Glasgow, M.D.  NEW PATIENT EVALUATION   Name: Tiffany Sweeney Date: 11/19/2013 MRN: [id number] DOB: May 09, 1934  PCP: [name], MD   REFERRING PHYSICIAN: [name], MD  REASON FOR REFERRAL: Newly diagnosed left breast cancer.     HISTORY OF PRESENT ILLNESS:Katherine Cornforth is a 79 y.o. female who is referred by her family physician and surgeon for evaluation and management of newly diagnosed left breast cancer. Patient had been treated with right modified radical mastectomy followed by 5 years of tamoxifen for right breast cancer 17 years ago. She had undergone yearly mammography with findings of abnormality this year. Biopsy performed on 10/21/2013 revealed triple negative left breast cancer with Ki-67 of 13%. Left modified radical mastectomy done on 11/04/2013 revealed a 1.8 cm tumor that was triple negative, all my lymph nodes were negative. She is here today to discuss adjuvant treatment She feels well with no lymphedema on either side. She denies any sore throat, cough, wheezing, but has had chronic lower extremity swelling for many years. She denies a diarrhea, constipation, melena, hematochezia, hematuria, headache, cough, wheezing, skin rash, or seizures.   PAST MEDICAL HISTORY:  has a past medical history of High cholesterol; Hypertension; Anxiety; Cancer; Arthritis; and Breast cancer.     Breast cancer   10/21/2013 Initial Diagnosis Left breast cancer, core biopsy, ER negative, PR negative, HER-2/neu negative, Ki-67 of 13%, infiltrating duct cell   11/04/2013 Surgery Left modified radical mastectomy, 1.8 cm infiltrating duct cell carcinoma, ER negative, PR negative, HER-2/neu not overexpressed, 9 lymph nodes negative. Stage TI cN0 M0.     PAST SURGICAL HISTORY: Past Surgical History  Procedure Laterality Date  . Abdominal hysterectomy    . Mastectomy Right   . Cataract extraction w/phaco  Left 03/11/2013    Procedure: CATARACT EXTRACTION PHACO AND INTRAOCULAR LENS PLACEMENT (IOC);  Surgeon: Tonny Branch, MD;  Location: [facility];  Service: Ophthalmology;  Laterality: Left;  CDE:7.54  . Cataract extraction w/phaco Right 04/15/2013    Procedure: CATARACT EXTRACTION PHACO AND INTRAOCULAR LENS PLACEMENT (IOC);  Surgeon: Tonny Branch, MD;  Location: [facility];  Service: Ophthalmology;  Laterality: Right;  CDE 9.38  . Mastectomy modified radical Left 11/04/2013    Procedure: LEFT MASTECTOMY MODIFIED RADICAL;  Surgeon: Jamesetta So, MD;  Location: [facility];  Service: General;  Laterality: Left;     CURRENT MEDICATIONS: has a current medication list which includes the following prescription(s): atenolol, hydrocodone-acetaminophen, and lorazepam.   ALLERGIES: Review of patient's allergies indicates no known allergies.   SOCIAL HISTORY:  reports that she quit smoking about 28 years ago. Her smoking use included Cigarettes. She started smoking about 57 years ago. She has a 7.5 pack-year smoking history. She has never used smokeless tobacco. She reports that she does not drink alcohol or use illicit drugs.   FAMILY HISTORY: family history is not on file.    REVIEW OF SYSTEMS:  Other than that discussed above is noncontributory.    PHYSICAL EXAM:  height is 5' 4"  (1.626 m) and weight is 157 lb 8 oz (71.442 kg). Her oral temperature is 97.9 F (36.6 C). Her blood pressure is 153/68 and her pulse is 67. Her respiration is 18 and oxygen saturation is 100%.    GENERAL:alert, no distress and comfortable SKIN: skin color, texture, turgor are normal, no rashes or significant lesions EYES: normal, Conjunctiva are pink and non-injected, sclera clear OROPHARYNX:no exudate, no erythema  and lips, buccal mucosa, and tongue normal  NECK: supple, thyroid normal size, non-tender, without nodularity CHEST: status post bilateral mastectomy with no subcutaneous  nodules. LYMPH:  no palpable lymphadenopathy in  the cervical, axillary or inguinal LUNGS: clear to auscultation and percussion with normal breathing effort HEART: regular rate & rhythm and no murmurs ABDOMEN:abdomen soft, non-tender and normal bowel sounds MUSCULOSKELETALl:no cyanosis of digits, bilateral lower extremity +2 edema with negative Homans sign NEURO: alert & oriented x 3 with fluent speech, no focal motor/sensory deficits    LABORATORY DATA:  Office Visit on 11/19/2013  Component Date Value Ref Range Status  . WBC 11/19/2013 7.1  4.0 - 10.5 K/uL Final  . RBC 11/19/2013 4.31  3.87 - 5.11 MIL/uL Final  . Hemoglobin 11/19/2013 12.8  12.0 - 15.0 g/dL Final  . HCT 11/19/2013 38.7  36.0 - 46.0 % Final  . MCV 11/19/2013 89.8  78.0 - 100.0 fL Final  . MCH 11/19/2013 29.7  26.0 - 34.0 pg Final  . MCHC 11/19/2013 33.1  30.0 - 36.0 g/dL Final  . RDW 11/19/2013 13.7  11.5 - 15.5 % Final  . Platelets 11/19/2013 304  150 - 400 K/uL Final  . Neutrophils Relative % 11/19/2013 59  43 - 77 % Final  . Neutro Abs 11/19/2013 4.1  1.7 - 7.7 K/uL Final  . Lymphocytes Relative 11/19/2013 31  12 - 46 % Final  . Lymphs Abs 11/19/2013 2.2  0.7 - 4.0 K/uL Final  . Monocytes Relative 11/19/2013 8  3 - 12 % Final  . Monocytes Absolute 11/19/2013 0.6  0.1 - 1.0 K/uL Final  . Eosinophils Relative 11/19/2013 2  0 - 5 % Final  . Eosinophils Absolute 11/19/2013 0.2  0.0 - 0.7 K/uL Final  . Basophils Relative 11/19/2013 0  0 - 1 % Final  . Basophils Absolute 11/19/2013 0.0  0.0 - 0.1 K/uL Final  . Sodium 11/19/2013 142  137 - 147 mEq/L Final  . Potassium 11/19/2013 3.6* 3.7 - 5.3 mEq/L Final  . Chloride 11/19/2013 101  96 - 112 mEq/L Final  . CO2 11/19/2013 30  19 - 32 mEq/L Final  . Glucose, Bld 11/19/2013 108* 70 - 99 mg/dL Final  . BUN 11/19/2013 9  6 - 23 mg/dL Final  . Creatinine, Ser 11/19/2013 0.79  0.50 - 1.10 mg/dL Final  . Calcium 11/19/2013 9.0  8.4 - 10.5 mg/dL Final  . Total Protein 11/19/2013 7.0  6.0 - 8.3 g/dL Final  . Albumin  11/19/2013 3.7  3.5 - 5.2 g/dL Final  . AST 11/19/2013 18  0 - 37 U/L Final  . ALT 11/19/2013 10  0 - 35 U/L Final  . Alkaline Phosphatase 11/19/2013 84  39 - 117 U/L Final  . Total Bilirubin 11/19/2013 0.5  0.3 - 1.2 mg/dL Final  . GFR calc non Af Amer 11/19/2013 78* >90 mL/min Final  . GFR calc Af Amer 11/19/2013 >90  >90 mL/min Final   Comment: (NOTE) The eGFR has been calculated using the CKD EPI equation. This calculation has not been validated in all clinical situations. eGFR's persistently <90 mL/min signify possible Chronic Kidney Disease.   . Anion gap 11/19/2013 11  5 - 15 Final  Admission on 11/04/2013, Discharged on 11/06/2013  Component Date Value Ref Range Status  . WBC 11/05/2013 7.1  4.0 - 10.5 K/uL Final  . RBC 11/05/2013 3.82* 3.87 - 5.11 MIL/uL Final  . Hemoglobin 11/05/2013 11.4* 12.0 - 15.0 g/dL Final  .  HCT 11/05/2013 33.9* 36.0 - 46.0 % Final  . MCV 11/05/2013 88.7  78.0 - 100.0 fL Final  . MCH 11/05/2013 29.8  26.0 - 34.0 pg Final  . MCHC 11/05/2013 33.6  30.0 - 36.0 g/dL Final  . RDW 11/05/2013 13.3  11.5 - 15.5 % Final  . Platelets 11/05/2013 225  150 - 400 K/uL Final  . Sodium 11/05/2013 142  137 - 147 mEq/L Final  . Potassium 11/05/2013 3.6* 3.7 - 5.3 mEq/L Final  . Chloride 11/05/2013 103  96 - 112 mEq/L Final  . CO2 11/05/2013 30  19 - 32 mEq/L Final  . Glucose, Bld 11/05/2013 121* 70 - 99 mg/dL Final  . BUN 11/05/2013 10  6 - 23 mg/dL Final  . Creatinine, Ser 11/05/2013 0.84  0.50 - 1.10 mg/dL Final  . Calcium 11/05/2013 8.4  8.4 - 10.5 mg/dL Final  . GFR calc non Af Amer 11/05/2013 65* >90 mL/min Final  . GFR calc Af Amer 11/05/2013 76* >90 mL/min Final   Comment: (NOTE) The eGFR has been calculated using the CKD EPI equation. This calculation has not been validated in all clinical situations. eGFR's persistently <90 mL/min signify possible Chronic Kidney Disease.   Georgiann Hahn gap 11/05/2013 9  5 - 15 Final  Hospital Outpatient Visit on  10/30/2013  Component Date Value Ref Range Status  . WBC 10/30/2013 5.9  4.0 - 10.5 K/uL Final  . RBC 10/30/2013 4.45  3.87 - 5.11 MIL/uL Final  . Hemoglobin 10/30/2013 13.2  12.0 - 15.0 g/dL Final  . HCT 10/30/2013 39.2  36.0 - 46.0 % Final  . MCV 10/30/2013 88.1  78.0 - 100.0 fL Final  . MCH 10/30/2013 29.7  26.0 - 34.0 pg Final  . MCHC 10/30/2013 33.7  30.0 - 36.0 g/dL Final  . RDW 10/30/2013 13.2  11.5 - 15.5 % Final  . Platelets 10/30/2013 239  150 - 400 K/uL Final  . Neutrophils Relative % 10/30/2013 58  43 - 77 % Final  . Neutro Abs 10/30/2013 3.5  1.7 - 7.7 K/uL Final  . Lymphocytes Relative 10/30/2013 30  12 - 46 % Final  . Lymphs Abs 10/30/2013 1.8  0.7 - 4.0 K/uL Final  . Monocytes Relative 10/30/2013 9  3 - 12 % Final  . Monocytes Absolute 10/30/2013 0.5  0.1 - 1.0 K/uL Final  . Eosinophils Relative 10/30/2013 2  0 - 5 % Final  . Eosinophils Absolute 10/30/2013 0.1  0.0 - 0.7 K/uL Final  . Basophils Relative 10/30/2013 1  0 - 1 % Final  . Basophils Absolute 10/30/2013 0.1  0.0 - 0.1 K/uL Final  . Sodium 10/30/2013 143  137 - 147 mEq/L Final  . Potassium 10/30/2013 4.0  3.7 - 5.3 mEq/L Final  . Chloride 10/30/2013 104  96 - 112 mEq/L Final  . CO2 10/30/2013 29  19 - 32 mEq/L Final  . Glucose, Bld 10/30/2013 89  70 - 99 mg/dL Final  . BUN 10/30/2013 10  6 - 23 mg/dL Final  . Creatinine, Ser 10/30/2013 0.80  0.50 - 1.10 mg/dL Final  . Calcium 10/30/2013 9.4  8.4 - 10.5 mg/dL Final  . Total Protein 10/30/2013 6.6  6.0 - 8.3 g/dL Final  . Albumin 10/30/2013 3.6  3.5 - 5.2 g/dL Final  . AST 10/30/2013 15  0 - 37 U/L Final  . ALT 10/30/2013 11  0 - 35 U/L Final  . Alkaline Phosphatase 10/30/2013 77  39 - 117 U/L Final  .  Total Bilirubin 10/30/2013 0.4  0.3 - 1.2 mg/dL Final  . GFR calc non Af Amer 10/30/2013 69* >90 mL/min Final  . GFR calc Af Amer 10/30/2013 80* >90 mL/min Final   Comment: (NOTE)                          The eGFR has been calculated using the CKD EPI  equation.                          This calculation has not been validated in all clinical situations.                          eGFR's persistently <90 mL/min signify possible Chronic Kidney                          Disease.  . Anion gap 10/30/2013 10  5 - 15 Final  . ABO/RH(D) 10/30/2013 A POS   Final  . Antibody Screen 10/30/2013 NEG   Final  . Sample Expiration 10/30/2013 11/13/2013   Final    Urinalysis No results found for: COLORURINE, APPEARANCEUR, LABSPEC, PHURINE, GLUCOSEU, HGBUR, BILIRUBINUR, KETONESUR, PROTEINUR, UROBILINOGEN, NITRITE, LEUKOCYTESUR    @RADIOGRAPHY :  Unilat L   Status: Final result       Study Result     CLINICAL DATA: Screening. Right mastectomy 1997.  EXAM: DIGITAL SCREENING UNILATERAL LEFT MAMMOGRAM WITH CAD  COMPARISON: Previous exam(s)  ACR Breast Density Category b: There are scattered areas of fibroglandular density.  FINDINGS: In the left breast, a possible mass warrants further evaluation with spot compression views and possibly ultrasound.  Images were processed with CAD.  IMPRESSION: Further evaluation is suggested for possible mass in the left breast.  RECOMMENDATION: Diagnostic mammogram and possibly ultrasound of the left breast. (Code:[id number])  The patient will be contacted regarding the findings, and additional imaging will be scheduled.  BI-RADS CATEGORY 0: Incomplete. Need additional imaging evaluation and/or prior mammograms for comparison.   Electronically Signed  By: Shon Hale M.D.  On: 10/03/2013 08     Chest 2 View  10/30/2013   CLINICAL DATA:  Left breast biopsy. History of breast cancer. Hypertension.  EXAM: CHEST  2 VIEW  COMPARISON:  08/09/2008  FINDINGS: Normal heart size. No pleural effusion or edema. No airspace consolidation. The visualized osseous structures are unremarkable.  IMPRESSION: 1. No acute cardiopulmonary abnormalities.   Electronically Signed   By: Kerby Moors  M.D.   On: 10/30/2013 16:13   Mm Digital Diagnostic Unilat L  10/21/2013   CLINICAL DATA:  Status post ultrasound-guided core biopsy of mass in the 2 o'clock location of the left breast.  EXAM: DIAGNOSTIC LEFT MAMMOGRAM POST ULTRASOUND BIOPSY  COMPARISON:  10/14/2013 and earlier  FINDINGS: Mammographic images were obtained following ultrasound guided biopsy of mass in the 2 o'clock location of the left breast. A coil shaped clip is identified in the upper-outer quadrant of the left breast as expected.  IMPRESSION: Tissue marker clip in the expected location following biopsy of left breast mass.  Final Assessment: Post Procedure Mammograms for Marker Placement   Electronically Signed   By: Shon Hale M.D.   On: 10/21/2013 14:10   [place] Lt Breast Bx W Loc Dev 1st Lesion Img Bx Spec US Guide  10/22/2013   ADDENDUM REPORT: 10/22/2013 10:04  ADDENDUM: PATHOLOGY ADDENDUM:  Pathology  Breast, left, needle core biopsy, mass, 2 o'clock  - INVASIVE DUCTAL CARCINOMA WITH CALCIFICATIONS.  Pathology concordance with imaging findings: Yes  Recommendation: Consultation regarding treatment plan  At the request of the patient, I spoke with her by telephone on 10/22/2013 at 09:59. She reports doing well after the biopsy. She is considering her options for referral and will discuss these with Dr. Moshe Cipro.  The patient will follow-up with Susa Raring, R.N. at the [facility].   Electronically Signed   By: Shon Hale M.D.   On: 10/22/2013 10:04   10/22/2013   CLINICAL DATA:  The patient returns for ultrasound-guided core biopsy of left breast mass. History of right mastectomy in 1997.  EXAM: ULTRASOUND GUIDED LEFT BREAST CORE NEEDLE BIOPSY WITH VACUUM ASSIST  COMPARISON:  09/30/2013 and earlier  PROCEDURE: I met with the patient and we discussed the procedure of ultrasound-guided biopsy, including benefits and alternatives. We discussed the high likelihood of a successful procedure. We discussed the  risks of the procedure including infection, bleeding, tissue injury, clip migration, and inadequate sampling. Informed written consent was given. The usual time-out protocol was performed immediately prior to the procedure.  Using sterile technique and 2% Lidocaine as local anesthetic, under direct ultrasound visualization, a 12 gauge vacuum-assisteddevice was used to perform biopsy of mass in the 2 o'clock location of the left breast using a lateral approach. At the conclusion of the procedure, a coil shaped tissue marker clip was deployed into the biopsy cavity. Follow-up 2-view mammogram was performed and dictated separately.  IMPRESSION: Ultrasound-guided biopsy of left breast mass. No apparent complications.  Electronically Signed: By: Shon Hale M.D. On: 10/21/2013 14:09    PATHOLOGY:  FINAL for JAKERRA, FLOYD ([id number]) Patient: Harriet Masson Collected: 10/21/2013 Client: [facility] Accession: [id number] Received: 10/21/2013 Nolon Nations, MD DOB: 1934/03/11 Age: 30 Gender: F Reported: 10/22/2013 Mazie Patient Ph: [phone] MRN #: [id number] [place], [place] [address] Client Acc#: Chart #: [id number] Phone: [phone] Fax: CC: Ernie Avena, MD REPORT OF SURGICAL PATHOLOGY ADDITIONAL INFORMATION: PROGNOSTIC INDICATORS - ACIS Results: IMMUNOHISTOCHEMICAL AND MORPHOMETRIC ANALYSIS BY THE AUTOMATED CELLULAR IMAGING SYSTEM (ACIS) Estrogen Receptor: 0%, NEGATIVE Progesterone Receptor: 0%, NEGATIVE Proliferation Marker Ki67: 13% COMMENT: The negative hormone receptor study(ies) in this case have an internal positive control. REFERENCE RANGE ESTROGEN RECEPTOR NEGATIVE <1% POSITIVE =>1% PROGESTERONE RECEPTOR NEGATIVE <1% POSITIVE =>1% All controls stained appropriately Claudette Laws MD Pathologist, Electronic Signature ( Signed 10/25/2013) CHROMOGENIC IN-SITU HYBRIDIZATION Results: HER-2/NEU BY CISH - NO AMPLIFICATION  OF HER-2 DETECTED. RESULT RATIO OF HER2: CEP 17 SIGNALS 0.79 AVERAGE HER2 COPY NUMBER PER CELL 1.55 1 of 3 FINAL for Saal, Yexalen ([id number]) ADDITIONAL INFORMATION:(continued) REFERENCE RANGE NEGATIVE HER2/Chr17 Ratio <2.0 and Average HER2 copy number <4.0 EQUIVOCAL HER2/Chr17 Ratio <2.0 and Average HER2 copy number 4.0 and <6.0 POSITIVE HER2/Chr17 Ratio >=2.0 and/or Average HER2 copy number >=6.0 Claudette Laws MD Pathologist, Electronic Signature ( Signed 10/25/2013) FINAL DIAGNOSIS Diagnosis Breast, left, needle core biopsy, mass, 2 o'clock - INVASIVE DUCTAL CARCINOMA WITH CALCIFICATIONS. - SEE COMMENT. Microscopic Comment The carcinoma appears grade I. A breast prognostic profile will be performed and the results reported separately. The results were called to the [facility] on 10/22/13. (JBK:gt, 10/22/13) Enid Cutter MD Pathologist, Electronic Signature (Case signed 10/22/2013) Specimen Gross and Clinical Information Specimen Comment Small mass 2 o'clock, 1 cm / nipple Prior right mastectomy 1997 In formalin 1:40, extracted <1 min Call report  to the [facility] [phone] or [phone] ext [phone] Specimen(s) Obtained: Breast, left, needle core biopsy, mass, 2 o'clock Specimen Clinical Information Suspect small IDC Gross Received in formalin are 3 cores of soft, tan-red tissue which measure0.8 x 0.3 x 0.3 cm and up to 1.3 x 0.3 x 0.3 cm. Submitted in toto in 1 block(s) Time in Formalin 1:40 pm Disclaimer Her2Neu by CISH (chromogenic in-situ hybridization) is performed at [facility], using the Her2CISH pharmDx Kit (code number [phone]). This test is used to detect the amplification of the Her2Neu gene in interphase nuclei from formalin fixed, paraffin embedded tissue and is reported using ASCO/CAP scoring criteria published in 2013. PR progesterone receptor (PgR 636), immunohistochemical stains are performed on formalin fixed, paraffin  embedded tissue using a 3,3"-diaminobenzidine (DAB) chromogen and [employer] Aut[employer]ainer System. The staining intensity of the nucleus is scored morphometrically using the Automated Cellular Imaging System (ACIS) and is reported as the percentage of tumor cell nuclei demonstrating specific nuclear staining. Estrogen receptor (SP1), immunohistochemical stains are performed on formalin fixed, paraffin embedded tissue using a 3,3"-diaminobenzidine (DAB) chromogen and [employer] Autostainer System. The staining intensity of the nucleus is scored morphometrically using the Automated Cellular Imaging System (ACIS) and is reported as the percentage of tumor cell nuclei demonstrating specific nuclear staining. Ki-67 (Mib-1), immunohistochemical stains are performed on formalin fixed, paraffin embedded tissue using a 3,3"-diaminobenzidine (DAB) chromogen and [place]. The staining intensity of the nucleus is scored morphometrically using the Automated Cellular Imaging System (ACIS) and is reported as the percentage of tumor cell nuclei demonstrating specific nuclear staining. 2 of 3 FINAL for Raineri, Nunzio Cory [phone]) Report signed out from the following location(s) Technical Component and Interpretation performed at [facility] [address],STE   FINAL for DARIONA, POSTMA ([id number]) Patient: ELEASE, SWARM Collected: 11/04/2013 Client:  [facility] Accession: [id number] Received: 11/04/2013 Aviva Signs DOB: 1934/08/27 Age: 69 Gender: F Reported: 11/06/2013 [address] Patient Ph: [phone] MRN #: [id number] [place], [place] [address] Visit #: [id number] Chart #: Phone: [phone] Fax: CC: REPORT OF SURGICAL PATHOLOGY ADDITIONAL INFORMATION: CHROMOGENIC IN-SITU HYBRIDIZATION Results: HER-2/NEU BY CISH - NO AMPLIFICATION OF HER-2 DETECTED. RESULT RATIO OF HER2: CEP 17 SIGNALS 0.89 AVERAGE HER2 COPY NUMBER PER CELL 1.60 REFERENCE RANGE NEGATIVE  HER2/Chr17 Ratio <2.0 and Average HER2 copy number <4.0 EQUIVOCAL HER2/Chr17 Ratio <2.0 and Average HER2 copy number 4.0 and <6.0 POSITIVE HER2/Chr17 Ratio >=2.0 and/or Average HER2 copy number >=6.0 Claudette Laws MD Pathologist, Electronic Signature ( Signed 11/13/2013) PROGNOSTIC INDICATORS - ACIS Results: IMMUNOHISTOCHEMICAL AND MORPHOMETRIC ANALYSIS BY THE AUTOMATED CELLULAR IMAGING SYSTEM (ACIS) Estrogen Receptor: 0%, NEGATIVE (PERFORMED MANUALLY) Progesterone Receptor: 0%, NEGATIVE (PERFORMED MANUALLY) COMMENT: The negative hormone receptor study(ies) in this case have an internal positive control. REFERENCE RANGE ESTROGEN RECEPTOR NEGATIVE <1% 1 of 4 FINAL for Evett, Zoila ([id number]) ADDITIONAL INFORMATION:(continued) POSITIVE =>1% PROGESTERONE RECEPTOR NEGATIVE <1% POSITIVE =>1% All controls stained appropriately Enid Cutter MD Pathologist, Electronic Signature ( Signed 11/08/2013) FINAL DIAGNOSIS Diagnosis Breast, modified radical mastectomy , left - INVASIVE DUCTAL CARCINOMA, NOTTINGHAM COMBINED HISTOLOGIC GRADE I, 1.8 CM - ADJACENT DUCTAL CARCINOMA IN SITU. - RESECTION MARGINS, NEGATIVE FOR ATYPIA OR MALIGNANCY. - DUCTAL CARCINOMA IN SITU PRESENT IN THE RANDOM SECTION TAKEN FROM THE UPPER OUTER QUADRANT. - NINE LYMPH NODES, NEGATIVE FOR METASTATIC CARCINOMA (0/9). - RESECTION MARGINS, NEGATIVE FOR ATYPIA OR MALIGNANCY. Microscopic Comment BREAST, INVASIVE TUMOR, WITH LYMPH NODES PRESENT Specimen, including laterality and lymph node sampling (sentinel, non-sentinel): Left breast Procedure: Modified radical mastectomy  Histologic type: Invasive ductal carcinoma Grade: I Tubule formation: 1 Nuclear pleomorphism: 2 Mitotic: 1 Tumor size (gross measurement): 1.8 cm Margins: Negative Invasive, distance to closest margin: Posterior margin: 2 .7 cm In-situ, distance to closest margin: Posterior margin, 2.7 cm Lymphovascular invasion: Not identified Ductal  carcinoma in situ: Present Grade: II/ Intermediate grade Extensive intraductal component: No Lobular neoplasia: No Tumor focality: Unifocal Treatment effect: No Extent of tumor: Skin: No Nipple: No Skeletal muscle: N/A Lymph nodes: Examined: 9 Sentinel 2 of 4 FINAL for Mancinelli, Nari ([id number]) Microscopic Comment(continued) 0 Non-sentinel 9 Total Lymph nodes with metastasis: 0 Isolated tumor cells (< 0.2 mm): 0 Micrometastasis: (> 0.2 mm and < 2.0 mm): 0 Macrometastasis: (> 2.0 mm): 0 Extracapsular extension: 0 Breast prognostic profile: Please correlate with previous case [phone] Estrogen receptor: 0%, negative Progesterone receptor: 0%, negative Her 2 neu: No amplification of Her 2 neu; ratio of Her 2:CEP 17 signals is 0.79. Average Her 2 copy per number is 1.55 Ki-67: 13% Non-neoplastic breast: Fibrocystic changes TNM: pT1c, pN0 Comments: ER, PR and Her 2 neu will be repeated and an addendum report will follow. (HCL:kh 11-06-13) Aldona Bar MD Pathologist, Electronic Signature (Case signed 11/06/2013) Specimen Gross and Clinical Information Specimen(s) Obtained: Breast, modified radical mastectomy , left Specimen Clinical Information left breast cancer Gross Specimen: The specimen is received in formalin with cut surfaces exposed at 3 p.m. on 11/04/13, and labeled "left breast tissue and axilla". Specimen integrity (intact/disrupted): Intact, with sutures designating the superior and lateral aspects. Weight: 583 grams. Size: 18.6 cm medial to lateral x 16.5 cm superior to inferior x 4.2 cm anterior to posterior, with a 12.5 x 5.8 x 2.0 cm axillary tail. Skin: There is a 16.9 x 3.5 cm ellipse of tan pink skin, with a 2.1 x 1.6 cm tan pink partially retracted nipple. The skin surface is grossly unremarkable. Tumor/cavity: Within the upper outer quadrant there is a 1.8 x 1.5 x 1.5 cm tan red, firm, hemorrhagic ill-defined area identified. A silver metallic  spring shaped biopsy clip is identified within the lesion. The lesion measures 2.7 cm from the posterior resection margin and 3.3 cm from the anterior resection margin. Uninvolved parenchyma: Yellow lobulated adipose tissue with tan white, focally nodular fibrous tissue (approximately 20% fibrous). Prognostic indicators: Obtain from paraffin blocks if needed. Lymph nodes: Sectioning through the axillary contents reveal nine possible lymph nodes, ranging from 0.2 cm to 2.0 x 1.2 x 0.8 cm. Block summary: A = posterior margin closest to lesion B = anterior margin closest to lesion C - I = sections of lesion and hemorrhagic tissue J = upper outer quadrant K = lower outer quadrant L = upper inner quadrant M = lower inner quadrant N = two possible lymph nodes 3 of 4 FINAL for Hufford, Junior ([id number]) Gross(continued) O = three possible lymph nodes P = two possible lymph nodes, differentially inked and bisected Q = one possible lymph node R = one possible lymph, bisected (KL:ds 11/05/13) Stain(s) used in Diagnosis: The following stain(s) were used in diagnosing the case: ER-ACIS, PR-ACIS, CISH. The control(s) stained appropriately. Disclaimer Her2Neu by CISH (chromogenic in-situ hybridization) is performed at [facility], using the Bear pharmDx Kit (code number [id number]). This test is used to detect the amplification of the Her2Neu gene in interphase nuclei from formalin fixed, paraffin embedded tissue and is reported using ASCO/CAP scoring criteria published in 2013. PR progesterone receptor (PgR 636), immunohistochemical stains are performed on formalin fixed, paraffin embedded tissue using  a 3,3"-diaminobenzidine (DAB) chromogen and Ogilvie. The staining intensity of the nucleus is scored morphometrically using the Automated Cellular Imaging System (ACIS) and is reported as the percentage of tumor cell nuclei demonstrating specific nuclear staining. Estrogen  receptor (SP1), immunohistochemical stains are performed on formalin fixed, paraffin embedded tissue using a 3,3"-diaminobenzidine (DAB) chromogen and [employer] System. The staining intensity of the nucleus is scored morphometrically using the Automated Cellular Imaging System (ACIS) and is reported as the percentage of tumor cell nuclei demonstrating specific nuclear staining. Report signed out from the following location(s) Technical Component performed at [facility]. [address],[place] [address].[id number]., Technical Component performed at [address], [place], [place] [address]. CLIA #: [id number], Interpretation performed at [place], [place],  IMPRESSION:  #1. Stage I (T1cN0 M0) triple negative left breast cancer, status post left modified radical mastectomy with Ki-67 of 13%.  Both biopsy and lumpectomy specimen had identical phenotype.the relatively low Ki-67 may be an indication that her tumor is androgen receptor positive. #2. Osteoporosis, on treatment. #3. Essential hypertension. #4. History of right breast cancer, ER positive, 17 years ago, treated with right modified radical mastectomy followed by 5 years of tamoxifen.   PLAN:  #1. In the presence of her daughter, a discussion was held regarding the use of adjuvant treatment in this particular type of breast cancer. Recommendation was made for 4 cycles of Taxotere/Cytoxan with Neulasta support with no indication for radiation therapy. #2. Additional molecular biological studies will be done on this patient's tumor to see whether or not it is in fact androgen receptor positive because of the low proliferation index. #3. The patient tends to go on vacation to [place] on 12/13/2013 and with that in mind, Ranges were made for initiation of treatment on 12/23/2013. #4.Dr. Marin Olp has been following her for  many years and she does have an appointment to see him on 11/25/2013. #5. Referral back to Dr. Arnoldo Morale for life port insertion.  I appreciate the opportunity of sharing in her care.   Doroteo Bradford, MD 11/19/2013 8:20 PM   DISCLAIMER:  This note was dictated with voice recognition softwre.  Similar sounding words can inadvertently be transcribed inaccurately and may not be corrected upon review.

## 2013-11-20 LAB — CANCER ANTIGEN 27.29: CA 27.29: 25 U/mL (ref 0–39)

## 2013-11-20 LAB — CEA: CEA: 1.3 ng/mL (ref 0.0–5.0)

## 2013-11-20 LAB — VITAMIN D 25 HYDROXY (VIT D DEFICIENCY, FRACTURES): Vit D, 25-Hydroxy: 18 ng/mL — ABNORMAL LOW (ref 30–100)

## 2013-11-21 ENCOUNTER — Telehealth: Payer: Self-pay | Admitting: *Deleted

## 2013-11-21 NOTE — Telephone Encounter (Signed)
-----   Message from Volanda Napoleon, MD sent at 11/20/2013  7:33 AM EST ----- Call - vit D is low.. Is she taking any??  pete

## 2013-11-21 NOTE — Telephone Encounter (Signed)
Informed patient her vitamin d level is low.  Patient not currently taking supplementation.  Reviewed foods that are high in vitmin d and told patient to start taking vitamin d 2000 u daily.

## 2013-11-22 ENCOUNTER — Ambulatory Visit (HOSPITAL_COMMUNITY): Payer: Medicare Other

## 2013-11-25 ENCOUNTER — Encounter: Payer: Self-pay | Admitting: Hematology & Oncology

## 2013-11-25 ENCOUNTER — Ambulatory Visit (HOSPITAL_COMMUNITY): Payer: Medicare Other

## 2013-11-25 ENCOUNTER — Ambulatory Visit (HOSPITAL_BASED_OUTPATIENT_CLINIC_OR_DEPARTMENT_OTHER): Payer: Medicare Other | Admitting: Hematology & Oncology

## 2013-11-25 ENCOUNTER — Other Ambulatory Visit: Payer: Medicare Other | Admitting: Lab

## 2013-11-25 VITALS — BP 155/79 | HR 60 | Temp 97.8°F | Resp 14 | Ht 64.0 in | Wt 153.0 lb

## 2013-11-25 DIAGNOSIS — C50412 Malignant neoplasm of upper-outer quadrant of left female breast: Secondary | ICD-10-CM

## 2013-11-25 DIAGNOSIS — C50912 Malignant neoplasm of unspecified site of left female breast: Secondary | ICD-10-CM

## 2013-11-25 MED ORDER — DEXAMETHASONE 4 MG PO TABS: ORAL_TABLET | ORAL | Status: DC

## 2013-11-25 MED ORDER — ONDANSETRON HCL 8 MG PO TABS
ORAL_TABLET | ORAL | Status: DC
Start: 2013-11-25 — End: 2013-11-27

## 2013-11-25 MED ORDER — LIDOCAINE-PRILOCAINE 2.5-2.5 % EX CREA: 1.0000 "application " | TOPICAL_CREAM | CUTANEOUS | Status: DC | PRN

## 2013-11-25 MED ORDER — PROCHLORPERAZINE MALEATE 10 MG PO TABS: 10.0000 mg | ORAL_TABLET | Freq: Four times a day (QID) | ORAL | Status: DC | PRN

## 2013-11-25 NOTE — H&P (Signed)
  NTS SOAP Note  Vital Signs:  Vitals as of: 45/85/9292: Systolic 446: Diastolic 89: Heart Rate 63: Temp 97.97F: Height 13ft 4in: Weight 160Lbs 0 Ounces: BMI 27.46  BMI : 27.46 kg/m2  Subjective: This 79old female presents for a Port-A-Cath insertion due to recently diagnosed left breast cancer. She is about to undergo chemotherapy.   Review of Symptoms:  Constitutional:unremarkable   Head:unremarkable Eyes:unremarkable   Nose/Mouth/Throat:unremarkable Cardiovascular:  unremarkable Respiratory:unremarkable Gastrointestinheartburn Genitourinary:unremarkable   joint and back pain Skin:unremarkable Hematolgic/Lymphatic:unremarkable   Allergic/Immunologic:unremarkable   Past Medical History:  Reviewed  Past Medical History  Surgical History: right modified radical mastectomy 1997,  TAH,  cataract surgery Medical Problems: HTN Allergies: nkda Medications: anatol,  anxiety med   Social History:Reviewed  Social History  Preferred Language: English Race:  White Ethnicity: Not Hispanic / Latino Age: 77 month/26 day Marital Status:  W Alcohol: no   Smoking Status: Never smoker reviewed on 10/29/2013 Functional Status reviewed on 10/29/2013 ------------------------------------------------ Bathing: Normal Cooking: Normal Dressing: Normal Driving: Normal Eating: Normal Managing Meds: Normal Oral Care: Normal Shopping: Normal Toileting: Normal Transferring: Normal Walking: Normal Cognitive Status reviewed on 10/29/2013 ------------------------------------------------ Attention: Normal Decision Making: Normal Language: Normal Memory: Normal Motor: Normal Perception: Normal Problem Solving: Normal Visual and Spatial: Normal   Family History:Reviewed  Family Health History Family History is Unknown    Objective Information: General:Well appearing, well nourished in no distress. Neck:Supple without lymphadenopathy.  Heart:RRR, no  murmur or gallop.  Normal S1, S2.  No S3, S4.  Lungs:  CTA bilaterally, no wheezes, rhonchi, rales.  Breathing unlabored. right mastectomy.  Left breast without dominant mass,  nipple discharge,  dimpling.  Axilla negative for palpable nodes. mammogram and path reports reviewed. Assessment:Left breast cancer,  clinical stage 1  Diagnoses: 174.9  C50.912 Primary malignant neoplasm of female breast (Malignant neoplasm of unspecified site of left female breast)   Plan:  Scheduled for Port-A-Cath insertion on 12/06/2013. The risks and benefits of the procedure including bleeding, infection, and pneumothorax were fully explained to the patient, who gave informed consent.  Follow-up:Pending Surgery

## 2013-11-25 NOTE — Progress Notes (Signed)
Hematology and Oncology Follow Up Visit  Lucila Klecka [id number] [birth year] 79 y.o. 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  Frogge is back for follow-up. I foresee, she did have a breast cancer in the left breast. She had an abnormal mammogram. Ultimate, she went for mastectomy. The pathology report ([id number]) showed an invasive ductal carcinoma. This was a triple negative breast cancer. The tumor measured 1.8 cm. All margins were negative. There is no lymphovascular space invasion. She had 9 sentinel lymph nodes. These were all negative. Again, her tumor was ER negative, PR negative, and HER-2 negative.  She is soft one of the oncologist up locally. They want to start her on systemic chemotherapy in the adjuvant setting. I told her that this is very reasonable given that she had triple negative breast cancer. I talked to her and her daughter for about 45 minutes regarding triple negative breast cancer.  She will have a Port-A-Cath placed. Treatment will not start until December 21. She and her family are going down to [place] for a vacation right after Thanksgiving.  She feels well. She has had little pain in the left chest. She has decent range of motion of the left shoulder. She is exercising. She try to keep her left shoulder mobile.  She's had no positive bowels or bladder. She's had no cough. There's been no shortness of breath.  Overall, her performance status is ECOG 0.  Medications: Current outpatient prescriptions: atenolol (TENORMIN) 50 MG tablet, Take 50 mg by mouth daily., Disp: , Rfl: ;  Cholecalciferol (VITAMIN D-3 PO), Take 2,000 Units by mouth every morning., Disp: , Rfl: ;  LORazepam (ATIVAN) 0.5 MG tablet, Take 0.5 mg by mouth daily as needed for anxiety. , Disp: , Rfl: ;  dexamethasone (DECADRON) 4 MG tablet, Take 2 tablets  TWICE a day for 5 days - start the day before chemotherapy, Disp: 60 tablet, Rfl: 2 lidocaine-prilocaine (EMLA) cream, Apply 1 application topically as needed., Disp: 30 g, Rfl: 3;  ondansetron (ZOFRAN) 8 MG tablet, Take 1 tablet twice a day for 4 days - start 1 day after each chemotherapy, Disp: 30 tablet, Rfl: 2;  prochlorperazine (COMPAZINE) 10 MG tablet, Take 1 tablet (10 mg total) by mouth every 6 (six) hours as needed for nausea or vomiting., Disp: 60 tablet, Rfl: 2  Allergies: No Known Allergies  Past Medical History, Surgical history, Social history, and Family History were reviewed and updated.  Review of Systems: As above  Physical Exam:  height is 5' 4"  (1.626 m) and weight is 153 lb (69.4 kg). Her oral temperature is 97.8 F (36.6 C). Her blood pressure is 155/79 and her pulse is 60. Her respiration is 14.   Well-developed and well-nourished white female. Head and neck exam shows no ocular or oral lesions. She has no palpable cervical or supraclavicular lymph nodes. Lungs are clear. Cardiac exam regular rate and rhythm with no murmurs, rubs or bruits. Breast exam shows bilateral mastectomies. The left chest wall shows a healing mastectomy scar. There is some slight ecchymoses. She has no swelling. She has no tenderness. There is no edema under the left arm. Abdomen is soft. She has good bowel sounds. There is no fluid wave. There is no palpable liver or spleen tip. Back exam shows no tenderness over the spine, ribs or hips.  Extremities shows no clubbing, cyanosis or edema. No edema is noted in the left arm. Skin exam shows no rashes, ecchymoses or petechia. Neurological exam is nonfocal.  Lab Results  Component Value Date   WBC 7.1 11/19/2013   HGB 12.8 11/19/2013   HCT 38.7 11/19/2013   MCV 89.8 11/19/2013   PLT 304 11/19/2013     Chemistry      Component Value Date/Time   NA 142 11/19/2013 1622   K 3.6* 11/19/2013 1622   CL 101 11/19/2013 1622   CO2 30 11/19/2013 1622    BUN 9 11/19/2013 1622   CREATININE 0.79 11/19/2013 1622      Component Value Date/Time   CALCIUM 9.0 11/19/2013 1622   ALKPHOS 84 11/19/2013 1622   AST 18 11/19/2013 1622   ALT 10 11/19/2013 1622   BILITOT 0.5 11/19/2013 1622         Impression and Plan: Ms. Greenly is 79 year old postmenopausal female. She has a new stage I breast cancer of the left breast. She had what mastectomy.  I totally agree with systemic chemotherapy. I think this is very reasonable.  I think that without chemotherapy, risk of recurrence will be about 25%. With chemotherapy, I think the risk of recurrence every week at down to about 10% or so.  I also think that Zometa would be helpful. I think in the post menopausal women, there is a benefit to Zometa to help decrease the risk of bone metastasis.  She wants to be treated locally. This certainly is okay. It will be easier for her. We will still follow her in our office.  I will see her back in about 2 weeks or so after her first cycle of treatment.  Again, I spent about 45-50 minutes with she and her daughter. I answered all their questions. I explained to them what I thought some of the side effects would be. She will be on Neulasta after treatment.   Volanda Napoleon, MD 11/23/20155:52 PM

## 2013-11-27 MED ORDER — PROCHLORPERAZINE MALEATE 10 MG PO TABS: 10.0000 mg | ORAL_TABLET | Freq: Four times a day (QID) | ORAL | Status: DC | PRN

## 2013-11-27 MED ORDER — DEXAMETHASONE 4 MG PO TABS: ORAL_TABLET | ORAL | Status: DC

## 2013-11-27 MED ORDER — LIDOCAINE-PRILOCAINE 2.5-2.5 % EX CREA: TOPICAL_CREAM | CUTANEOUS | Status: DC

## 2013-11-27 MED ORDER — ONDANSETRON HCL 8 MG PO TABS: 8.0000 mg | ORAL_TABLET | Freq: Two times a day (BID) | ORAL | Status: DC | PRN

## 2013-11-27 NOTE — Patient Instructions (Addendum)
[facility]   CHEMOTHERAPY INSTRUCTIONS  Pre chemo Meds:   Aloxi - to decrease nausea/vomiting. This is an IV medication. Aloxi has the potential of remaining in your system for approximately 3 days.   Emend - to decrease nausea/vomiting. This is an IV medication. Emend also has the potential of remaining in your system for approximately 3 days.   Dexamethasone - steroid - this is to decrease your risk of having fluid retention from the Taxotere chemo. This is an IV medication. This medication may cause you to feel anxious, nervous, jittery, make you have trouble sleeping, make you feel flushed/look red in the face, neck, chest areas. This should pass as the medication wears off.   Premeds will take 30 minutes to infuse.  Taxotere - bone marrow suppression (lowers white blood cells (fight infection), lowers red blood cells (make up your blood), lowers platelets (help blood to clot). This chemo can cause fluid retention. You will be responsible for taking a steroid called Dexamethasone at home prior to and after Taxotere. This steroid will keep you from having fluid retention. Take it whether you think you need it or not. Taxotere can cause hair loss, skin/nail changes (darkening of the nail beds, pain where the nail bed meets the skin, loosening of the nail beds, dry skin, palms of hands and soles of feet may darken or get sensitive, nausea/vomiting, paresthesia (numbness or tingling) in extremities - we need to know if this develops, mucositis (inflammation of any mucosal membrane - the mouth, throat), mouth sores, neurotoxicity (loss of memory, headaches, trouble sleeping, etc.), can also cause excessive tear production. Please let us know if any side effect develops. (This medication takes 1 hour to infuse)  Cytoxan is a chemotherapy drug. Cytoxan can cause nausea and vomiting and of course hair loss. It can also cause hemorrhagic cystitis (bloody urine) because  the chemo irritates your bladder. You need to push fluids when on this chemo, preferably water (64 oz a day). Do not hold your urine. Urinate before you go to bed and if you wake up during the night go to the bathroom. (This medication takes 30 minutes to infuse)  Neulasta - this medication is not chemo but being given because you have had chemo. It is usually given 24 hours after the completion of chemotherapy. This medication works by boosting your bone marrow's supply of white blood cells. White blood cells are what protect our bodies against infection. The medication is given in the form of a subcutaneous injection. It is given in the fatty tissue of your abdomen. It is a short needle. The major side effect of this medication is bone or muscle pain. The drug of choice to relieve or lessen the pain is Aleve or Ibuprofen. If a physician has ever told you not to take Aleve or Ibuprofen - then don't take it. You should then take Tylenol/acetaminophen. Take either medication as the bottle directs you to.  The level of pain you experience as a result of this injection can range from none, to mild or moderate, or severe. Please let us know if you develop moderate or severe bone pain.     POTENTIAL SIDE EFFECTS OF TREATMENT: Increased Susceptibility to Infection/Bone Marrow Suppression, Nausea/Vomiting, Constipation/Diarrhea/Abdominal Cramping/Hiccups/Heartburn, Hair Thinning/Hair Loss, Changes in Character of Skin and Nails (brittleness, dryness,etc.), Pigment Changes (darkening of nail beds, palms of hands, soles of feet, etc.),Urinary Frequency, Blood in Urine    SELF IMAGE NEEDS AND REFERRALS MADE:  Referral to Look Good, Feel Better consultant   EDUCATIONAL MATERIALS GIVEN AND REVIEWED: Chemotherapy and You booklet Specific Instructions Sheets: Taxotere, Cytoxan, Neulasta, Aloxi, Emend, Dexamethasone, EMLA cream, Zofran, Compazine   SELF CARE ACTIVITIES WHILE ON CHEMOTHERAPY: Increase your fluid  intake 48 hours prior to treatment and drink at least 2 quarts per day after treatment., No alcohol intake., No aspirin or other medications unless approved by your oncologist., Eat foods that are light and easy to digest., Eat foods at cold or room temperature., No fried, fatty, or spicy foods immediately before or after treatment., Have teeth cleaned professionally before starting treatment. Keep dentures and partial plates clean., Use soft toothbrush and do not use mouthwashes that contain alcohol. Biotene is a good mouthwash that is available at most pharmacies or may be ordered by calling [phone]., Use warm salt water gargles (1 teaspoon salt per 1 quart warm water) before and after meals and at bedtime. Or you may rinse with 2 tablespoons of three -percent hydrogen peroxide mixed in eight ounces of water., Always use sunscreen with SPF (Sun Protection Factor) of 30 or higher., Use your nausea medication as directed to prevent nausea., Use your stool softener or laxative as directed to prevent constipation. and Use your anti-diarrheal medication as directed to stop diarrhea.  Please wash your hands for at least 30 seconds using warm soapy water. Handwashing is the #1 way to prevent the spread of germs. Stay away from sick people or people who are getting over a cold. If you develop respiratory systems such as green/yellow mucus production or productive cough or persistent cough let us know and we will see if you need an antibiotic. It is a good idea to keep a pair of gloves on when going into grocery stores/[employer] to decrease your risk of coming into contact with germs on the carts, etc. Carry alcohol hand gel with you at all times and use it frequently if out in public. All foods need to be cooked thoroughly. No raw foods. No medium or undercooked meats, eggs. If your food is cooked medium well, it does not need to be hot pink or saturated with bloody liquid at all. Vegetables and fruits need to be  washed/rinsed under the faucet with a dish detergent before being consumed. You can eat raw fruits and vegetables unless we tell you otherwise but it would be best if you cooked them or bought frozen. Do not eat off of salad bars or hot bars unless you really trust the cleanliness of the restaurant. If you need dental work, please let us know before you go for your appointment so that we can coordinate the best possible time for you in regards to your chemo regimen. You need to also let your dentist know that you are actively taking chemo. We may need to do labs prior to your dental appointment. We also want your bowels moving at least every other day. If this is not happening, we need to know so that we can get you on a bowel regimen to help you go.     MEDICATIONS: You have been given prescriptions for the following medications:  Dexamethasone 4mg  tablet. The day before, day of, and day after chemo take 2 tablets in the morning and 2 tablets in the evening. Take with food.   Zofran 5m tablet. May take 1 tablet two times a day as needed for nausea and/or vomiting. *Do not take this for the first 2 days after chemo - this  is because the Aloxi/Emend you have taken is still in your system*  Compazine 10mg  tablet. May take 1 tablet every 6 hours as needed for nausea and/or vomiting.   EMLA cream. Apply a quarter size amount to port site 1 hour prior to chemo. Do not rub in. Cover with plastic wrap.  Over-the-Counter Meds:  Colace - this is a stool softener. Take 100mg  capsule 2-6 times a day as needed. If you have to take more than 6 capsules of Colace a day call the [facility].  Senna - this is a mild laxative used to treat mild constipation. May take 2 tabs by mouth daily or up to twice a day as needed for mild constipation.  Milk of Magnesia - this is a laxative used to treat moderate to severe constipation. May take 2-4 tablespoons every 8 hours as needed. May increase to 8 tablespoons x 1  dose and if no bowel movement call the [facility].  Imodium - this is for diarrhea. Take 2 tabs after 1st loose stool and then 1 tab after each loose stool until you go a total of 12 hours without a loose stool. Call [facility] if loose stools continue.   SYMPTOMS TO REPORT AS SOON AS POSSIBLE AFTER TREATMENT:  FEVER GREATER THAN 100.5 F  CHILLS WITH OR WITHOUT FEVER  NAUSEA AND VOMITING THAT IS NOT CONTROLLED WITH YOUR NAUSEA MEDICATION  UNUSUAL SHORTNESS OF BREATH  UNUSUAL BRUISING OR BLEEDING  TENDERNESS IN MOUTH AND THROAT WITH OR WITHOUT PRESENCE OF ULCERS  URINARY PROBLEMS  BOWEL PROBLEMS  UNUSUAL RASH    Wear comfortable clothing and clothing appropriate for easy access to any Portacath or PICC line. Let us know if there is anything that we can do to make your therapy better!      I have been informed and understand all of the instructions given to me and have received a copy. I have been instructed to call the clinic [phone] or my family physician as soon as possible for continued medical care, if indicated. I do not have any more questions at this time but understand that I may call the [facility] or the Patient Navigator at [phone] during office hours should I have questions or need assistance in obtaining follow-up care.          Docetaxel injection What is this medicine? DOCETAXEL (doe se TAX el) is a chemotherapy drug. It targets fast dividing cells, like cancer cells, and causes these cells to die. This medicine is used to treat many types of cancers like breast cancer, certain stomach cancers, head and neck cancer, lung cancer, and prostate cancer. This medicine may be used for other purposes; ask your health care provider or pharmacist if you have questions. COMMON BRAND NAME(S): Docefrez, Taxotere What should I tell my health care provider before I take this medicine? They need to know if you have any of these  conditions: -infection (especially a virus infection such as chickenpox, cold sores, or herpes) -liver disease -low blood counts, like low white cell, platelet, or red cell counts -an unusual or allergic reaction to docetaxel, polysorbate 80, other chemotherapy agents, other medicines, foods, dyes, or preservatives -pregnant or trying to get pregnant -breast-feeding How should I use this medicine? This drug is given as an infusion into a vein. It is administered in a hospital or clinic by a specially trained health care professional. Talk to your pediatrician regarding the use of this medicine in children. Special  care may be needed. Overdosage: If you think you have taken too much of this medicine contact a poison control center or emergency room at once. NOTE: This medicine is only for you. Do not share this medicine with others. What if I miss a dose? It is important not to miss your dose. Call your doctor or health care professional if you are unable to keep an appointment. What may interact with this medicine? -cyclosporine -erythromycin -ketoconazole -medicines to increase blood counts like filgrastim, pegfilgrastim, sargramostim -vaccines Talk to your doctor or health care professional before taking any of these medicines: -acetaminophen -aspirin -ibuprofen -ketoprofen -naproxen This list may not describe all possible interactions. Give your health care provider a list of all the medicines, herbs, non-prescription drugs, or dietary supplements you use. Also tell them if you smoke, drink alcohol, or use illegal drugs. Some items may interact with your medicine. What should I watch for while using this medicine? Your condition will be monitored carefully while you are receiving this medicine. You will need important blood work done while you are taking this medicine. This drug may make you feel generally unwell. This is not uncommon, as chemotherapy can affect healthy cells as well  as cancer cells. Report any side effects. Continue your course of treatment even though you feel ill unless your doctor tells you to stop. In some cases, you may be given additional medicines to help with side effects. Follow all directions for their use. Call your doctor or health care professional for advice if you get a fever, chills or sore throat, or other symptoms of a cold or flu. Do not treat yourself. This drug decreases your body's ability to fight infections. Try to avoid being around people who are sick. This medicine may increase your risk to bruise or bleed. Call your doctor or health care professional if you notice any unusual bleeding. Be careful brushing and flossing your teeth or using a toothpick because you may get an infection or bleed more easily. If you have any dental work done, tell your dentist you are receiving this medicine. Avoid taking products that contain aspirin, acetaminophen, ibuprofen, naproxen, or ketoprofen unless instructed by your doctor. These medicines may hide a fever. This medicine contains an alcohol in the product. You may get drowsy or dizzy. Do not drive, use machinery, or do anything that needs mental alertness until you know how this medicine affects you. Do not stand or sit up quickly, especially if you are an older patient. This reduces the risk of dizzy or fainting spells. Avoid alcoholic drinks Do not become pregnant while taking this medicine. Women should inform their doctor if they wish to become pregnant or think they might be pregnant. There is a potential for serious side effects to an unborn child. Talk to your health care professional or pharmacist for more information. Do not breast-feed an infant while taking this medicine. What side effects may I notice from receiving this medicine? Side effects that you should report to your doctor or health care professional as soon as possible: -allergic reactions like skin rash, itching or hives, swelling  of the face, lips, or tongue -low blood counts - This drug may decrease the number of white blood cells, red blood cells and platelets. You may be at increased risk for infections and bleeding. -signs of infection - fever or chills, cough, sore throat, pain or difficulty passing urine -signs of decreased platelets or bleeding - bruising, pinpoint red spots on the skin, black,  tarry stools, nosebleeds -signs of decreased red blood cells - unusually weak or tired, fainting spells, lightheadedness -breathing problems -fast or irregular heartbeat -low blood pressure -mouth sores -nausea and vomiting -pain, swelling, redness or irritation at the injection site -pain, tingling, numbness in the hands or feet -swelling of the ankle, feet, hands -weight gain Side effects that usually do not require medical attention (report to your prescriber or health care professional if they continue or are bothersome): -bone pain -complete hair loss including hair on your head, underarms, pubic hair, eyebrows, and eyelashes -diarrhea -excessive tearing -changes in the color of fingernails -loosening of the fingernails -nausea -muscle pain -red flush to skin -sweating -weak or tired This list may not describe all possible side effects. Call your doctor for medical advice about side effects. You may report side effects to FDA at 1-800-FDA-1088. Where should I keep my medicine? This drug is given in a hospital or clinic and will not be stored at home. NOTE: This sheet is a summary. It may not cover all possible information. If you have questions about this medicine, talk to your doctor, pharmacist, or health care provider.  2015, Elsevier/Gold Standard. (2012-11-15 22:21:02) Cyclophosphamide injection What is this medicine? CYCLOPHOSPHAMIDE (sye kloe FOSS fa mide) is a chemotherapy drug. It slows the growth of cancer cells. This medicine is used to treat many types of cancer like lymphoma, myeloma,  leukemia, breast cancer, and ovarian cancer, to name a few. This medicine may be used for other purposes; ask your health care provider or pharmacist if you have questions. COMMON BRAND NAME(S): Cytoxan, Neosar What should I tell my health care provider before I take this medicine? They need to know if you have any of these conditions: -blood disorders -history of other chemotherapy -infection -kidney disease -liver disease -recent or ongoing radiation therapy -tumors in the bone marrow -an unusual or allergic reaction to cyclophosphamide, other chemotherapy, other medicines, foods, dyes, or preservatives -pregnant or trying to get pregnant -breast-feeding How should I use this medicine? This drug is usually given as an injection into a vein or muscle or by infusion into a vein. It is administered in a hospital or clinic by a specially trained health care professional. Talk to your pediatrician regarding the use of this medicine in children. Special care may be needed. Overdosage: If you think you have taken too much of this medicine contact a poison control center or emergency room at once. NOTE: This medicine is only for you. Do not share this medicine with others. What if I miss a dose? It is important not to miss your dose. Call your doctor or health care professional if you are unable to keep an appointment. What may interact with this medicine? This medicine may interact with the following medications: -amiodarone -amphotericin B -azathioprine -certain antiviral medicines for HIV or AIDS such as protease inhibitors (e.g., indinavir, ritonavir) and zidovudine -certain blood pressure medications such as benazepril, captopril, enalapril, fosinopril, lisinopril, moexipril, monopril, perindopril, quinapril, ramipril, trandolapril -certain cancer medications such as anthracyclines (e.g., daunorubicin, doxorubicin), busulfan, cytarabine, paclitaxel, pentostatin, tamoxifen,  trastuzumab -certain diuretics such as chlorothiazide, chlorthalidone, hydrochlorothiazide, indapamide, metolazone -certain medicines that treat or prevent blood clots like warfarin -certain muscle relaxants such as succinylcholine -cyclosporine -etanercept -indomethacin -medicines to increase blood counts like filgrastim, pegfilgrastim, sargramostim -medicines used as general anesthesia -metronidazole -natalizumab This list may not describe all possible interactions. Give your health care provider a list of all the medicines, herbs, non-prescription drugs, or dietary supplements you  use. Also tell them if you smoke, drink alcohol, or use illegal drugs. Some items may interact with your medicine. What should I watch for while using this medicine? Visit your doctor for checks on your progress. This drug may make you feel generally unwell. This is not uncommon, as chemotherapy can affect healthy cells as well as cancer cells. Report any side effects. Continue your course of treatment even though you feel ill unless your doctor tells you to stop. Drink water or other fluids as directed. Urinate often, even at night. In some cases, you may be given additional medicines to help with side effects. Follow all directions for their use. Call your doctor or health care professional for advice if you get a fever, chills or sore throat, or other symptoms of a cold or flu. Do not treat yourself. This drug decreases your body's ability to fight infections. Try to avoid being around people who are sick. This medicine may increase your risk to bruise or bleed. Call your doctor or health care professional if you notice any unusual bleeding. Be careful brushing and flossing your teeth or using a toothpick because you may get an infection or bleed more easily. If you have any dental work done, tell your dentist you are receiving this medicine. You may get drowsy or dizzy. Do not drive, use machinery, or do anything  that needs mental alertness until you know how this medicine affects you. Do not become pregnant while taking this medicine or for 1 year after stopping it. Women should inform their doctor if they wish to become pregnant or think they might be pregnant. Men should not father a child while taking this medicine and for 4 months after stopping it. There is a potential for serious side effects to an unborn child. Talk to your health care professional or pharmacist for more information. Do not breast-feed an infant while taking this medicine. This medicine may interfere with the ability to have a child. This medicine has caused ovarian failure in some women. This medicine has caused reduced sperm counts in some men. You should talk with your doctor or health care professional if you are concerned about your fertility. If you are going to have surgery, tell your doctor or health care professional that you have taken this medicine. What side effects may I notice from receiving this medicine? Side effects that you should report to your doctor or health care professional as soon as possible: -allergic reactions like skin rash, itching or hives, swelling of the face, lips, or tongue -low blood counts - this medicine may decrease the number of white blood cells, red blood cells and platelets. You may be at increased risk for infections and bleeding. -signs of infection - fever or chills, cough, sore throat, pain or difficulty passing urine -signs of decreased platelets or bleeding - bruising, pinpoint red spots on the skin, black, tarry stools, blood in the urine -signs of decreased red blood cells - unusually weak or tired, fainting spells, lightheadedness -breathing problems -dark urine -dizziness -palpitations -swelling of the ankles, feet, hands -trouble passing urine or change in the amount of urine -weight gain -yellowing of the eyes or skin Side effects that usually do not require medical attention  (report to your doctor or health care professional if they continue or are bothersome): -changes in nail or skin color -hair loss -missed menstrual periods -mouth sores -nausea, vomiting This list may not describe all possible side effects. Call your doctor for medical advice  about side effects. You may report side effects to FDA at 1-800-FDA-1088. Where should I keep my medicine? This drug is given in a hospital or clinic and will not be stored at home. NOTE: This sheet is a summary. It may not cover all possible information. If you have questions about this medicine, talk to your doctor, pharmacist, or health care provider.  2015, Elsevier/Gold Standard. (2011-11-04 16:22:58) Pegfilgrastim injection What is this medicine? PEGFILGRASTIM (peg fil GRA stim) is a long-acting granulocyte colony-stimulating factor that stimulates the growth of neutrophils, a type of white blood cell important in the body's fight against infection. It is used to reduce the incidence of fever and infection in patients with certain types of cancer who are receiving chemotherapy that affects the bone marrow. This medicine may be used for other purposes; ask your health care provider or pharmacist if you have questions. COMMON BRAND NAME(S): Neulasta What should I tell my health care provider before I take this medicine? They need to know if you have any of these conditions: -latex allergy -ongoing radiation therapy -sickle cell disease -skin reactions to acrylic adhesives (On-Body Injector only) -an unusual or allergic reaction to pegfilgrastim, filgrastim, other medicines, foods, dyes, or preservatives -pregnant or trying to get pregnant -breast-feeding How should I use this medicine? This medicine is for injection under the skin. If you get this medicine at home, you will be taught how to prepare and give the pre-filled syringe or how to use the On-body Injector. Refer to the patient Instructions for Use for  detailed instructions. Use exactly as directed. Take your medicine at regular intervals. Do not take your medicine more often than directed. It is important that you put your used needles and syringes in a special sharps container. Do not put them in a trash can. If you do not have a sharps container, call your pharmacist or healthcare provider to get one. Talk to your pediatrician regarding the use of this medicine in children. Special care may be needed. Overdosage: If you think you have taken too much of this medicine contact a poison control center or emergency room at once. NOTE: This medicine is only for you. Do not share this medicine with others. What if I miss a dose? It is important not to miss your dose. Call your doctor or health care professional if you miss your dose. If you miss a dose due to an On-body Injector failure or leakage, a new dose should be administered as soon as possible using a single prefilled syringe for manual use. What may interact with this medicine? Interactions have not been studied. Give your health care provider a list of all the medicines, herbs, non-prescription drugs, or dietary supplements you use. Also tell them if you smoke, drink alcohol, or use illegal drugs. Some items may interact with your medicine. This list may not describe all possible interactions. Give your health care provider a list of all the medicines, herbs, non-prescription drugs, or dietary supplements you use. Also tell them if you smoke, drink alcohol, or use illegal drugs. Some items may interact with your medicine. What should I watch for while using this medicine? You may need blood work done while you are taking this medicine. If you are going to need a MRI, CT scan, or other procedure, tell your doctor that you are using this medicine (On-Body Injector only). What side effects may I notice from receiving this medicine? Side effects that you should report to your doctor or health care  professional as soon as possible: -allergic reactions like skin rash, itching or hives, swelling of the face, lips, or tongue -dizziness -fever -pain, redness, or irritation at site where injected -pinpoint red spots on the skin -shortness of breath or breathing problems -stomach or side pain, or pain at the shoulder -swelling -tiredness -trouble passing urine Side effects that usually do not require medical attention (report to your doctor or health care professional if they continue or are bothersome): -bone pain -muscle pain This list may not describe all possible side effects. Call your doctor for medical advice about side effects. You may report side effects to FDA at 1-800-FDA-1088. Where should I keep my medicine? Keep out of the reach of children. Store pre-filled syringes in a refrigerator between 2 and 8 degrees C (36 and 46 degrees F). Do not freeze. Keep in carton to protect from light. Throw away this medicine if it is left out of the refrigerator for more than 48 hours. Throw away any unused medicine after the expiration date. NOTE: This sheet is a summary. It may not cover all possible information. If you have questions about this medicine, talk to your doctor, pharmacist, or health care provider.  2015, [employer] Standard. (2013-03-21 16:14:05) Palonosetron Injection What is this medicine? PALONOSETRON (pal oh NOE se tron) is used to prevent nausea and vomiting caused by chemotherapy. It also helps prevent delayed nausea and vomiting that may occur a few days after your treatment. This medicine may be used for other purposes; ask your health care provider or pharmacist if you have questions. COMMON BRAND NAME(S): Aloxi What should I tell my health care provider before I take this medicine? They need to know if you have any of these conditions: -an unusual or allergic reaction to palonosetron, dolasetron, granisetron, ondansetron, other medicines, foods, dyes, or  preservatives -pregnant or trying to get pregnant -breast-feeding How should I use this medicine? This medicine is for infusion into a vein. It is given by a health care professional in a hospital or clinic setting. Talk to your pediatrician regarding the use of this medicine in children. While this drug may be prescribed for children as young as 1 month for selected conditions, precautions do apply. Overdosage: If you think you have taken too much of this medicine contact a poison control center or emergency room at once. NOTE: This medicine is only for you. Do not share this medicine with others. What if I miss a dose? This does not apply. What may interact with this medicine? -certain medicines for depression, anxiety, or psychotic disturbances -fentanyl -linezolid -MAOIs like Carbex, Eldepryl, Marplan, Nardil, and Parnate -methylene blue (injected into a vein) -tramadol This list may not describe all possible interactions. Give your health care provider a list of all the medicines, herbs, non-prescription drugs, or dietary supplements you use. Also tell them if you smoke, drink alcohol, or use illegal drugs. Some items may interact with your medicine. What should I watch for while using this medicine? Your condition will be monitored carefully while you are receiving this medicine. What side effects may I notice from receiving this medicine? Side effects that you should report to your doctor or health care professional as soon as possible: -allergic reactions like skin rash, itching or hives, swelling of the face, lips, or tongue -breathing problems -confusion -dizziness -fast, irregular heartbeat -fever and chills -loss of balance or coordination -seizures -sweating -swelling of the hands and feet -tremors -unusually weak or tired Side effects that usually do not  require medical attention (report to your doctor or health care professional if they continue or are  bothersome): -constipation or diarrhea -headache This list may not describe all possible side effects. Call your doctor for medical advice about side effects. You may report side effects to FDA at 1-800-FDA-1088. Where should I keep my medicine? This drug is given in a hospital or clinic and will not be stored at home. NOTE: This sheet is a summary. It may not cover all possible information. If you have questions about this medicine, talk to your doctor, pharmacist, or health care provider.  2015, [employer] Standard. (2012-10-26 10:38:36) Fosaprepitant injection What is this medicine? FOSAPREPITANT (fos ap RE pi tant) is used together with other medicines to prevent nausea and vomiting caused by cancer treatment (chemotherapy). This medicine may be used for other purposes; ask your health care provider or pharmacist if you have questions. COMMON BRAND NAME(S): Emend What should I tell my health care provider before I take this medicine? They need to know if you have any of these conditions: -liver disease -an unusual or allergic reaction to fosaprepitant, aprepitant, medicines, foods, dyes, or preservatives -pregnant or trying to get pregnant -breast-feeding How should I use this medicine? This medicine is for injection into a vein. It is given by a health care professional in a hospital or clinic setting. Talk to your pediatrician regarding the use of this medicine in children. Special care may be needed. Overdosage: If you think you have taken too much of this medicine contact a poison control center or emergency room at once. NOTE: This medicine is only for you. Do not share this medicine with others. What if I miss a dose? This does not apply. What may interact with this medicine? Do not take this medicine with any of these medicines: -cisapride -pimozide -ranolazine This medicine may also interact with the following medications: -diltiazem -female hormones, like estrogens  or progestins and birth control pills -medicines for fungal infections like ketoconazole and itraconazole -medicines for HIV -medicines for seizures or to control epilepsy like carbamazepine or phenytoin -medicines used for sleep or anxiety disorders like alprazolam, diazepam, or midazolam -nefazodone -paroxetine -rifampin -some chemotherapy medications like etoposide, ifosfamide, vinblastine, vincristine -some antibiotics like clarithromycin, erythromycin, troleandomycin -steroid medicines like dexamethasone or methylprednisolone -tolbutamide -warfarin This list may not describe all possible interactions. Give your health care provider a list of all the medicines, herbs, non-prescription drugs, or dietary supplements you use. Also tell them if you smoke, drink alcohol, or use illegal drugs. Some items may interact with your medicine. What should I watch for while using this medicine? Do not take this medicine if you already have nausea and vomiting. Ask your health care provider what to do if you already have nausea. Birth control pills may not work properly while you are taking this medicine. Talk to your doctor about using an extra method of birth control. This medicine should not be used continuously for a long time. Visit your doctor or health care professional for regular check-ups. This medicine may change your liver function blood test results. What side effects may I notice from receiving this medicine? Side effects that you should report to your doctor or health care professional as soon as possible: -allergic reactions like skin rash, itching or hives, swelling of the face, lips, or tongue -breathing problems -changes in heart rhythm -high or low blood pressure -pain, redness, or irritation at site where injected -rectal bleeding -serious dizziness or disorientation, confusion -sharp or severe  stomach pain -sharp pain in your leg Side effects that usually do not require  medical attention (report to your doctor or health care professional if they continue or are bothersome): -constipation or diarrhea -hair loss -headache -hiccups -loss of appetite -nausea -upset stomach -tiredness This list may not describe all possible side effects. Call your doctor for medical advice about side effects. You may report side effects to FDA at 1-800-FDA-1088. Where should I keep my medicine? This drug is given in a hospital or clinic and will not be stored at home. NOTE: This sheet is a summary. It may not cover all possible information. If you have questions about this medicine, talk to your doctor, pharmacist, or health care provider.  2015, [employer] Standard. (2008-12-02 12:46:13) Dexamethasone injection What is this medicine? DEXAMETHASONE (dex a METH a sone) is a corticosteroid. It is used to treat inflammation of the skin, joints, lungs, and other organs. Common conditions treated include asthma, allergies, and arthritis. It is also used for other conditions, like blood disorders and diseases of the adrenal glands. This medicine may be used for other purposes; ask your health care provider or pharmacist if you have questions. COMMON BRAND NAME(S): Decadron, Solurex What should I tell my health care provider before I take this medicine? They need to know if you have any of these conditions: -blood clotting problems -Cushing's syndrome -diabetes -glaucoma -heart problems or disease -high blood pressure -infection like herpes, measles, tuberculosis, or chickenpox -kidney disease -liver disease -mental problems -myasthenia gravis -osteoporosis -previous heart attack -seizures -stomach, ulcer or intestine disease including colitis and diverticulitis -thyroid problem -an unusual or allergic reaction to dexamethasone, corticosteroids, other medicines, lactose, foods, dyes, or preservatives -pregnant or trying to get pregnant -breast-feeding How should I  use this medicine? This medicine is for injection into a muscle, joint, lesion, soft tissue, or vein. It is given by a health care professional in a hospital or clinic setting. Talk to your pediatrician regarding the use of this medicine in children. Special care may be needed. Overdosage: If you think you have taken too much of this medicine contact a poison control center or emergency room at once. NOTE: This medicine is only for you. Do not share this medicine with others. What if I miss a dose? This may not apply. If you are having a series of injections over a prolonged period, try not to miss an appointment. Call your doctor or health care professional to reschedule if you are unable to keep an appointment. What may interact with this medicine? Do not take this medicine with any of the following medications: -mifepristone, RU-486 -vaccines This medicine may also interact with the following medications: -amphotericin B -antibiotics like clarithromycin, erythromycin, and troleandomycin -aspirin and aspirin-like drugs -barbiturates like phenobarbital -carbamazepine -cholestyramine -cholinesterase inhibitors like donepezil, galantamine, rivastigmine, and tacrine -cyclosporine -digoxin -diuretics -ephedrine -female hormones, like estrogens or progestins and birth control pills -indinavir -isoniazid -ketoconazole -medicines for diabetes -medicines that improve muscle tone or strength for conditions like myasthenia gravis -NSAIDs, medicines for pain and inflammation, like ibuprofen or naproxen -phenytoin -rifampin -thalidomide -warfarin This list may not describe all possible interactions. Give your health care provider a list of all the medicines, herbs, non-prescription drugs, or dietary supplements you use. Also tell them if you smoke, drink alcohol, or use illegal drugs. Some items may interact with your medicine. What should I watch for while using this medicine? Your  condition will be monitored carefully while you are receiving this medicine. If you  are taking this medicine for a long time, carry an identification card with your name and address, the type and dose of your medicine, and your doctor's name and address. This medicine may increase your risk of getting an infection. Stay away from people who are sick. Tell your doctor or health care professional if you are around anyone with measles or chickenpox. Talk to your health care provider before you get any vaccines that you take this medicine. If you are going to have surgery, tell your doctor or health care professional that you have taken this medicine within the last twelve months. Ask your doctor or health care professional about your diet. You may need to lower the amount of salt you eat. The medicine can increase your blood sugar. If you are a diabetic check with your doctor if you need help adjusting the dose of your diabetic medicine. What side effects may I notice from receiving this medicine? Side effects that you should report to your doctor or health care professional as soon as possible: -allergic reactions like skin rash, itching or hives, swelling of the face, lips, or tongue -black or tarry stools -change in the amount of urine -changes in vision -confusion, excitement, restlessness, a false sense of well-being -fever, sore throat, sneezing, cough, or other signs of infection, wounds that will not heal -hallucinations -increased thirst -mental depression, mood swings, mistaken feelings of self importance or of being mistreated -pain in hips, back, ribs, arms, shoulders, or legs -pain, redness, or irritation at the injection site -redness, blistering, peeling or loosening of the skin, including inside the mouth -rounding out of face -swelling of feet or lower legs -unusual bleeding or bruising -unusual tired or weak -wounds that do not heal Side effects that usually do not require  medical attention (report to your doctor or health care professional if they continue or are bothersome): -diarrhea or constipation -change in taste -headache -nausea, vomiting -skin problems, acne, thin and shiny skin -touble sleeping -unusual growth of hair on the face or body -weight gain This list may not describe all possible side effects. Call your doctor for medical advice about side effects. You may report side effects to FDA at 1-800-FDA-1088. Where should I keep my medicine? This drug is given in a hospital or clinic and will not be stored at home. NOTE: This sheet is a summary. It may not cover all possible information. If you have questions about this medicine, talk to your doctor, pharmacist, or health care provider.  2015, [employer] Standard. (2007-04-12 14:04:12) Ondansetron tablets What is this medicine? ONDANSETRON (on DAN se tron) is used to treat nausea and vomiting caused by chemotherapy. It is also used to prevent or treat nausea and vomiting after surgery. This medicine may be used for other purposes; ask your health care provider or pharmacist if you have questions. COMMON BRAND NAME(S): Zofran What should I tell my health care provider before I take this medicine? They need to know if you have any of these conditions: -heart disease -history of irregular heartbeat -liver disease -low levels of magnesium or potassium in the blood -an unusual or allergic reaction to ondansetron, granisetron, other medicines, foods, dyes, or preservatives -pregnant or trying to get pregnant -breast-feeding How should I use this medicine? Take this medicine by mouth with a glass of water. Follow the directions on your prescription label. Take your doses at regular intervals. Do not take your medicine more often than directed. Talk to your pediatrician regarding the use of  this medicine in children. Special care may be needed. Overdosage: If you think you have taken too much of  this medicine contact a poison control center or emergency room at once. NOTE: This medicine is only for you. Do not share this medicine with others. What if I miss a dose? If you miss a dose, take it as soon as you can. If it is almost time for your next dose, take only that dose. Do not take double or extra doses. What may interact with this medicine? Do not take this medicine with any of the following medications: -apomorphine -certain medicines for fungal infections like fluconazole, itraconazole, ketoconazole, posaconazole, voriconazole -cisapride -dofetilide -dronedarone -pimozide -thioridazine -ziprasidone This medicine may also interact with the following medications: -carbamazepine -certain medicines for depression, anxiety, or psychotic disturbances -fentanyl -linezolid -MAOIs like Carbex, Eldepryl, Marplan, Nardil, and Parnate -methylene blue (injected into a vein) -other medicines that prolong the QT interval (cause an abnormal heart rhythm) -phenytoin -rifampicin -tramadol This list may not describe all possible interactions. Give your health care provider a list of all the medicines, herbs, non-prescription drugs, or dietary supplements you use. Also tell them if you smoke, drink alcohol, or use illegal drugs. Some items may interact with your medicine. What should I watch for while using this medicine? Check with your doctor or health care professional right away if you have any sign of an allergic reaction. What side effects may I notice from receiving this medicine? Side effects that you should report to your doctor or health care professional as soon as possible: -allergic reactions like skin rash, itching or hives, swelling of the face, lips or tongue -breathing problems -confusion -dizziness -fast or irregular heartbeat -feeling faint or lightheaded, falls -fever and chills -loss of balance or coordination -seizures -sweating -swelling of the hands or  feet -tightness in the chest -tremors -unusually weak or tired Side effects that usually do not require medical attention (report to your doctor or health care professional if they continue or are bothersome): -constipation or diarrhea -headache This list may not describe all possible side effects. Call your doctor for medical advice about side effects. You may report side effects to FDA at 1-800-FDA-1088. Where should I keep my medicine? Keep out of the reach of children. Store between 2 and 30 degrees C (36 and 86 degrees F). Throw away any unused medicine after the expiration date. NOTE: This sheet is a summary. It may not cover all possible information. If you have questions about this medicine, talk to your doctor, pharmacist, or health care provider.  2015, [employer] Standard. (2012-09-26 16:27:45) Prochlorperazine tablets What is this medicine? PROCHLORPERAZINE (proe klor PER a zeen) helps to control severe nausea and vomiting. This medicine is also used to treat schizophrenia. It can also help patients who experience anxiety that is not due to psychological illness. This medicine may be used for other purposes; ask your health care provider or pharmacist if you have questions. COMMON BRAND NAME(S): Compazine What should I tell my health care provider before I take this medicine? They need to know if you have any of these conditions: -blood disorders or disease -dementia -liver disease or jaundice -Parkinson's disease -uncontrollable movement disorder -an unusual or allergic reaction to prochlorperazine, other medicines, foods, dyes, or preservatives -pregnant or trying to get pregnant -breast-feeding How should I use this medicine? Take this medicine by mouth with a glass of water. Follow the directions on the prescription label. Take your doses at regular intervals. Do not  take your medicine more often than directed. Do not stop taking this medicine suddenly. This can cause  nausea, vomiting, and dizziness. Ask your doctor or health care professional for advice. Talk to your pediatrician regarding the use of this medicine in children. Special care may be needed. While this drug may be prescribed for children as young as 2 years for selected conditions, precautions do apply. Overdosage: If you think you have taken too much of this medicine contact a poison control center or emergency room at once. NOTE: This medicine is only for you. Do not share this medicine with others. What if I miss a dose? If you miss a dose, take it as soon as you can. If it is almost time for your next dose, take only that dose. Do not take double or extra doses. What may interact with this medicine? Do not take this medicine with any of the following medications: -amoxapine -antidepressants like citalopram, escitalopram, fluoxetine, paroxetine, and sertraline -deferoxamine -dofetilide -maprotiline -tricyclic antidepressants like amitriptyline, clomipramine, imipramine, nortiptyline and others This medicine may also interact with the following medications: -lithium -medicines for pain -phenytoin -propranolol -warfarin This list may not describe all possible interactions. Give your health care provider a list of all the medicines, herbs, non-prescription drugs, or dietary supplements you use. Also tell them if you smoke, drink alcohol, or use illegal drugs. Some items may interact with your medicine. What should I watch for while using this medicine? Visit your doctor or health care professional for regular checks on your progress. You may get drowsy or dizzy. Do not drive, use machinery, or do anything that needs mental alertness until you know how this medicine affects you. Do not stand or sit up quickly, especially if you are an older patient. This reduces the risk of dizzy or fainting spells. Alcohol may interfere with the effect of this medicine. Avoid alcoholic drinks. This medicine  can reduce the response of your body to heat or cold. Dress warm in cold weather and stay hydrated in hot weather. If possible, avoid extreme temperatures like saunas, hot tubs, very hot or cold showers, or activities that can cause dehydration such as vigorous exercise. This medicine can make you more sensitive to the sun. Keep out of the sun. If you cannot avoid being in the sun, wear protective clothing and use sunscreen. Do not use sun lamps or tanning beds/booths. Your mouth may get dry. Chewing sugarless gum or sucking hard candy, and drinking plenty of water may help. Contact your doctor if the problem does not go away or is severe. What side effects may I notice from receiving this medicine? Side effects that you should report to your doctor or health care professional as soon as possible: -blurred vision -breast enlargement in men or women -breast milk in women who are not breast-feeding -chest pain, fast or irregular heartbeat -confusion, restlessness -dark yellow or brown urine -difficulty breathing or swallowing -dizziness or fainting spells -drooling, shaking, movement difficulty (shuffling walk) or rigidity -fever, chills, sore throat -involuntary or uncontrollable movements of the eyes, mouth, head, arms, and legs -seizures -stomach area pain -unusually weak or tired -unusual bleeding or bruising -yellowing of skin or eyes Side effects that usually do not require medical attention (report to your doctor or health care professional if they continue or are bothersome): -difficulty passing urine -difficulty sleeping -headache -sexual dysfunction -skin rash, or itching This list may not describe all possible side effects. Call your doctor for medical advice about side effects.  You may report side effects to FDA at 1-800-FDA-1088. Where should I keep my medicine? Keep out of the reach of children. Store at room temperature between 15 and 30 degrees C (59 and 86 degrees F).  Protect from light. Throw away any unused medicine after the expiration date. NOTE: This sheet is a summary. It may not cover all possible information. If you have questions about this medicine, talk to your doctor, pharmacist, or health care provider.  2015, Elsevier/Gold Standard. (2011-05-10 16:59:39) Lidocaine; Prilocaine cream What is this medicine? LIDOCAINE; PRILOCAINE (LYE doe kane; PRIL oh kane) is a topical anesthetic that causes loss of feeling in the skin and surrounding tissues. It is used to numb the skin before procedures or injections. This medicine may be used for other purposes; ask your health care provider or pharmacist if you have questions. COMMON BRAND NAME(S): EMLA What should I tell my health care provider before I take this medicine? They need to know if you have any of these conditions: -glucose-6-phosphate deficiencies -heart disease -kidney or liver disease -methemoglobinemia -an unusual or allergic reaction to lidocaine, prilocaine, other medicines, foods, dyes, or preservatives -pregnant or trying to get pregnant -breast-feeding How should I use this medicine? This medicine is for external use only on the skin. Do not take by mouth. Follow the directions on the prescription label. Wash hands before and after use. Do not use more or leave in contact with the skin longer than directed. Do not apply to eyes or open wounds. It can cause irritation and blurred or temporary loss of vision. If this medicine comes in contact with your eyes, immediately rinse the eye with water. Do not touch or rub the eye. Contact your health care provider right away. Talk to your pediatrician regarding the use of this medicine in children. While this medicine may be prescribed for children for selected conditions, precautions do apply. Overdosage: If you think you have taken too much of this medicine contact a poison control center or emergency room at once. NOTE: This medicine is only  for you. Do not share this medicine with others. What if I miss a dose? This medicine is usually only applied once prior to each procedure. It must be in contact with the skin for a period of time for it to work. If you applied this medicine later than directed, tell your health care professional before starting the procedure. What may interact with this medicine? -acetaminophen -chloroquine -dapsone -medicines to control heart rhythm -nitrates like nitroglycerin and nitroprusside -other ointments, creams, or sprays that may contain anesthetic medicine -phenobarbital -phenytoin -quinine -sulfonamides like sulfacetamide, sulfamethoxazole, sulfasalazine and others This list may not describe all possible interactions. Give your health care provider a list of all the medicines, herbs, non-prescription drugs, or dietary supplements you use. Also tell them if you smoke, drink alcohol, or use illegal drugs. Some items may interact with your medicine. What should I watch for while using this medicine? Be careful to avoid injury to the treated area while it is numb and you are not aware of pain. Avoid scratching, rubbing, or exposing the treated area to hot or cold temperatures until complete sensation has returned. The numb feeling will wear off a few hours after applying the cream. What side effects may I notice from receiving this medicine? Side effects that you should report to your doctor or health care professional as soon as possible: -blurred vision -chest pain -difficulty breathing -dizziness -drowsiness -fast or irregular heartbeat -skin rash or  itching -swelling of your throat, lips, or face -trembling Side effects that usually do not require medical attention (report to your doctor or health care professional if they continue or are bothersome): -changes in ability to feel hot or cold -redness and swelling at the application site This list may not describe all possible side effects.  Call your doctor for medical advice about side effects. You may report side effects to FDA at 1-800-FDA-1088. Where should I keep my medicine? Keep out of reach of children. Store at room temperature between 15 and 30 degrees C (59 and 86 degrees F). Keep container tightly closed. Throw away any unused medicine after the expiration date. NOTE: This sheet is a summary. It may not cover all possible information. If you have questions about this medicine, talk to your doctor, pharmacist, or health care provider.  2015, Elsevier/Gold Standard. (2007-06-25 17:14:35)

## 2013-12-02 ENCOUNTER — Encounter (HOSPITAL_BASED_OUTPATIENT_CLINIC_OR_DEPARTMENT_OTHER): Payer: Medicare Other

## 2013-12-02 DIAGNOSIS — C50412 Malignant neoplasm of upper-outer quadrant of left female breast: Secondary | ICD-10-CM

## 2013-12-02 DIAGNOSIS — C50912 Malignant neoplasm of unspecified site of left female breast: Secondary | ICD-10-CM

## 2013-12-02 NOTE — Progress Notes (Signed)
Order placed for [facility] referral.

## 2013-12-02 NOTE — Addendum Note (Signed)
Addended by: Gerhard Perches on: 12/02/2013 12:17 PM   Modules accepted: Orders

## 2013-12-02 NOTE — Progress Notes (Signed)
Chemo teaching done and consent signed for Taxotere & Cytoxan. Chemo/med calendar given. Meds called in last week to [facility]. [facility] referral made. Distress screening done.

## 2013-12-02 NOTE — Progress Notes (Signed)
[facility] Program Physical Impairment and Functional Assessment Screening Tool  1. Are you having any pain, including headaches, joint pain, or muscle pain (upper body = OT; lower body = PT)?  No  2. Do your hands and/or feet feel numb or tingle (PT)?  No  3. Does any part of your body feel swollen or larger than usual (upper body = OT; lower body = PT)?  No  4. Are you so tired that you cannot do the things you want or need to do (PT or OT)?  No  5. Are you feeling weak or are you having trouble moving any part of your body (PT/OT)?  Yes, this started after my diagnosis and is still a problem.  6. Are you having trouble concentrating, thinking, or remembering things (OT/ST)?  No  7. Are you having trouble moving around or feel like you might trip or fall (PT)?  No  8. Are you having trouble swallowing (ST)?  No  9. Are you having trouble speaking (ST)?  No  10. Are you having trouble with going or getting to the bathroom (OT)?  No  11. Are you having trouble with your sexual function (OT)?  No  12. Are you having trouble lifting things, even just your arms (OT/PT)?  Yes, this started after my diagnosis and is still a problem.  40. Are you having trouble taking care of yourself as in dressing or bathing (OT)?  No  14. Are you having trouble with daily tasks like chores or shopping (OT)?  No  15. Are you having trouble driving (OT)?  No  16. Are you having trouble returning to work or completing your tasks at work (OT)?  No  Other concerns:  Patient is having trouble with her Left arm range of motion since surgery and needs to be also assessed/treated/taught about lymphedema management.   Legend: OT = Occupational Therapy PT = Physical Therapy ST = Speech Therapy

## 2013-12-03 ENCOUNTER — Encounter (HOSPITAL_COMMUNITY)
Admission: RE | Admit: 2013-12-03 | Discharge: 2013-12-03 | Disposition: A | Discharge status: Home / Self Care | Payer: Medicare Other | Source: Ambulatory Visit | Attending: General Surgery | Admitting: General Surgery

## 2013-12-03 ENCOUNTER — Encounter (HOSPITAL_COMMUNITY): Payer: Self-pay

## 2013-12-04 ENCOUNTER — Ambulatory Visit (HOSPITAL_COMMUNITY): Payer: Medicare Other

## 2013-12-06 ENCOUNTER — Ambulatory Visit (HOSPITAL_COMMUNITY): Payer: Medicare Other

## 2013-12-06 ENCOUNTER — Ambulatory Visit (HOSPITAL_COMMUNITY): Payer: Medicare Other | Admitting: Anesthesiology

## 2013-12-06 ENCOUNTER — Ambulatory Visit (HOSPITAL_COMMUNITY)
Admission: RE | Admit: 2013-12-06 | Discharge: 2013-12-06 | Disposition: A | Discharge status: Home / Self Care | Payer: Medicare Other | Source: Ambulatory Visit | Attending: General Surgery | Admitting: General Surgery

## 2013-12-06 ENCOUNTER — Other Ambulatory Visit (HOSPITAL_COMMUNITY): Payer: Self-pay | Admitting: Hematology and Oncology

## 2013-12-06 ENCOUNTER — Encounter (HOSPITAL_COMMUNITY)
Admission: RE | Disposition: A | Discharge status: Home / Self Care | Payer: Self-pay | Source: Ambulatory Visit | Attending: General Surgery

## 2013-12-06 ENCOUNTER — Encounter (HOSPITAL_COMMUNITY): Payer: Self-pay | Admitting: *Deleted

## 2013-12-06 DIAGNOSIS — I10 Essential (primary) hypertension: Secondary | ICD-10-CM | POA: Insufficient documentation

## 2013-12-06 DIAGNOSIS — C50912 Malignant neoplasm of unspecified site of left female breast: Secondary | ICD-10-CM | POA: Diagnosis not present

## 2013-12-06 DIAGNOSIS — F419 Anxiety disorder, unspecified: Secondary | ICD-10-CM | POA: Insufficient documentation

## 2013-12-06 DIAGNOSIS — Z79899 Other long term (current) drug therapy: Secondary | ICD-10-CM | POA: Diagnosis not present

## 2013-12-06 DIAGNOSIS — Z87891 Personal history of nicotine dependence: Secondary | ICD-10-CM | POA: Insufficient documentation

## 2013-12-06 DIAGNOSIS — Z95828 Presence of other vascular implants and grafts: Secondary | ICD-10-CM

## 2013-12-06 HISTORY — PX: PORTACATH PLACEMENT: SHX2246

## 2013-12-06 SURGERY — INSERTION, TUNNELED CENTRAL VENOUS DEVICE, WITH PORT
Anesthesia: Monitor Anesthesia Care | Site: Chest | Laterality: Right

## 2013-12-06 MED ORDER — FENTANYL CITRATE 0.05 MG/ML IJ SOLN
INTRAMUSCULAR | Status: DC | PRN
  Administered 2013-12-06 (×2): 25 ug via INTRAVENOUS

## 2013-12-06 MED ORDER — CEFAZOLIN SODIUM-DEXTROSE 2-3 GM-% IV SOLR
2.0000 g | INTRAVENOUS | Status: AC
  Administered 2013-12-06: 2 g via INTRAVENOUS

## 2013-12-06 MED ORDER — LIDOCAINE HCL (PF) 1 % IJ SOLN
INTRAMUSCULAR | Status: AC
  Filled 2013-12-06: qty 30

## 2013-12-06 MED ORDER — HEPARIN SOD (PORK) LOCK FLUSH 100 UNIT/ML IV SOLN
INTRAVENOUS | Status: AC
Start: 2013-12-06 — End: 2013-12-06
  Filled 2013-12-06: qty 5

## 2013-12-06 MED ORDER — MIDAZOLAM HCL 2 MG/2ML IJ SOLN
INTRAMUSCULAR | Status: AC
  Filled 2013-12-06: qty 2

## 2013-12-06 MED ORDER — MIDAZOLAM HCL 5 MG/5ML IJ SOLN
INTRAMUSCULAR | Status: DC | PRN
  Administered 2013-12-06 (×2): 1 mg via INTRAVENOUS

## 2013-12-06 MED ORDER — ONDANSETRON HCL 4 MG/2ML IJ SOLN
INTRAMUSCULAR | Status: AC
  Filled 2013-12-06: qty 2

## 2013-12-06 MED ORDER — FENTANYL CITRATE 0.05 MG/ML IJ SOLN
INTRAMUSCULAR | Status: AC
  Filled 2013-12-06: qty 2

## 2013-12-06 MED ORDER — CHLORHEXIDINE GLUCONATE 4 % EX LIQD: 1.0000 "application " | Freq: Once | CUTANEOUS | Status: DC

## 2013-12-06 MED ORDER — LACTATED RINGERS IV SOLN
INTRAVENOUS | Status: DC
  Administered 2013-12-06: 09:00:00 via INTRAVENOUS

## 2013-12-06 MED ORDER — ONDANSETRON HCL 4 MG/2ML IJ SOLN
4.0000 mg | Freq: Once | INTRAMUSCULAR | Status: AC
  Administered 2013-12-06: 4 mg via INTRAVENOUS

## 2013-12-06 MED ORDER — HEPARIN SOD (PORK) LOCK FLUSH 100 UNIT/ML IV SOLN
INTRAVENOUS | Status: DC | PRN
  Administered 2013-12-06: 3000 [IU] via INTRAVENOUS

## 2013-12-06 MED ORDER — CEFAZOLIN SODIUM-DEXTROSE 2-3 GM-% IV SOLR
INTRAVENOUS | Status: AC
  Filled 2013-12-06: qty 50

## 2013-12-06 MED ORDER — SODIUM CHLORIDE 0.9 % IV SOLN
INTRAVENOUS | Status: DC | PRN
  Administered 2013-12-06: 500 mL via INTRAMUSCULAR

## 2013-12-06 MED ORDER — HYDROCODONE-ACETAMINOPHEN 5-325 MG PO TABS: 1.0000 | ORAL_TABLET | ORAL | Status: DC | PRN

## 2013-12-06 MED ORDER — FENTANYL CITRATE 0.05 MG/ML IJ SOLN: 25.0000 ug | INTRAMUSCULAR | Status: DC | PRN

## 2013-12-06 MED ORDER — FENTANYL CITRATE 0.05 MG/ML IJ SOLN
25.0000 ug | INTRAMUSCULAR | Status: AC
  Administered 2013-12-06 (×2): 25 ug via INTRAVENOUS

## 2013-12-06 MED ORDER — PROPOFOL 10 MG/ML IV EMUL
INTRAVENOUS | Status: AC
  Filled 2013-12-06: qty 20

## 2013-12-06 MED ORDER — LIDOCAINE HCL (PF) 1 % IJ SOLN
INTRAMUSCULAR | Status: DC | PRN
  Administered 2013-12-06: 6 mL

## 2013-12-06 MED ORDER — ONDANSETRON HCL 4 MG/2ML IJ SOLN: 4.0000 mg | Freq: Once | INTRAMUSCULAR | Status: DC | PRN

## 2013-12-06 MED ORDER — MIDAZOLAM HCL 2 MG/2ML IJ SOLN
1.0000 mg | INTRAMUSCULAR | Status: DC | PRN
  Administered 2013-12-06 (×2): 2 mg via INTRAVENOUS

## 2013-12-06 MED ORDER — PROPOFOL INFUSION 10 MG/ML OPTIME
INTRAVENOUS | Status: DC | PRN
  Administered 2013-12-06: 30 ug/kg/min via INTRAVENOUS

## 2013-12-06 SURGICAL SUPPLY — 37 items
ADH SKN CLS APL DERMABOND .7 (GAUZE/BANDAGES/DRESSINGS) ×1
APPLIER CLIP 9.375 SM OPEN (CLIP)
APR CLP SM 9.3 20 MLT OPN (CLIP)
BAG DECANTER FOR FLEXI CONT (MISCELLANEOUS) ×2 IMPLANT
BAG HAMPER (MISCELLANEOUS) ×2 IMPLANT
CATH HICKMAN DUAL 12.0 (CATHETERS) IMPLANT
CLIP APPLIE 9.375 SM OPEN (CLIP) IMPLANT
CLOTH BEACON ORANGE TIMEOUT ST (SAFETY) ×2 IMPLANT
COVER LIGHT HANDLE STERIS (MISCELLANEOUS) ×4 IMPLANT
DECANTER SPIKE VIAL GLASS SM (MISCELLANEOUS) ×2 IMPLANT
DERMABOND ADVANCED (GAUZE/BANDAGES/DRESSINGS) ×1
DERMABOND ADVANCED .7 DNX12 (GAUZE/BANDAGES/DRESSINGS) ×1 IMPLANT
DRAPE C-ARM FOLDED MOBILE STRL (DRAPES) ×2 IMPLANT
DURAPREP 6ML APPLICATOR 50/CS (WOUND CARE) ×2 IMPLANT
ELECT REM PT RETURN 9FT ADLT (ELECTROSURGICAL) ×2
ELECTRODE REM PT RTRN 9FT ADLT (ELECTROSURGICAL) ×1 IMPLANT
GLOVE SURG SS PI 7.5 STRL IVOR (GLOVE) ×4 IMPLANT
GOWN STRL REUS W/TWL LRG LVL3 (GOWN DISPOSABLE) ×4 IMPLANT
IV NS 500ML (IV SOLUTION) ×2
IV NS 500ML BAXH (IV SOLUTION) ×1 IMPLANT
KIT PORT POWER 8FR ISP MRI (CATHETERS) ×2 IMPLANT
KIT ROOM TURNOVER APOR (KITS) ×2 IMPLANT
MANIFOLD NEPTUNE II (INSTRUMENTS) ×2 IMPLANT
NDL HYPO 25X1 1.5 SAFETY (NEEDLE) ×1 IMPLANT
NEEDLE HYPO 25X1 1.5 SAFETY (NEEDLE) ×2 IMPLANT
PACK MINOR (CUSTOM PROCEDURE TRAY) ×2 IMPLANT
PAD ARMBOARD 7.5X6 YLW CONV (MISCELLANEOUS) ×2 IMPLANT
SET BASIN LINEN APH (SET/KITS/TRAYS/PACK) ×2 IMPLANT
SET INTRODUCER 12FR PACEMAKER (SHEATH) IMPLANT
SHEATH COOK PEEL AWAY SET 8F (SHEATH) IMPLANT
SUT PROLENE 3 0 PS 2 (SUTURE) IMPLANT
SUT VIC AB 3-0 SH 27 (SUTURE) ×2
SUT VIC AB 3-0 SH 27X BRD (SUTURE) ×1 IMPLANT
SUT VIC AB 4-0 PS2 27 (SUTURE) ×2 IMPLANT
SYR 20CC LL (SYRINGE) ×2 IMPLANT
SYRINGE 10CC LL (SYRINGE) ×2 IMPLANT
SYRINGE 12CC LL (MISCELLANEOUS) ×2 IMPLANT

## 2013-12-06 NOTE — Discharge Instructions (Signed)
Implanted Port Insertion, Care After Refer to this sheet in the next few weeks. These instructions provide you with information on caring for yourself after your procedure. Your health care provider may also give you more specific instructions. Your treatment has been planned according to current medical practices, but problems sometimes occur. Call your health care provider if you have any problems or questions after your procedure. WHAT TO EXPECT AFTER THE PROCEDURE After your procedure, it is typical to have the following:   Discomfort at the port insertion site. Ice packs to the area will help.  Bruising on the skin over the port. This will subside in 3-4 days. HOME CARE INSTRUCTIONS  After your port is placed, you will get a manufacturer's information card. The card has information about your port. Keep this card with you at all times.   Know what kind of port you have. There are many types of ports available.   Wear a medical alert bracelet in case of an emergency. This can help alert health care workers that you have a port.   The port can stay in for as long as your health care provider believes it is necessary.   A home health care nurse may give medicines and take care of the port.   You or a family member can get special training and directions for giving medicine and taking care of the port at home.  SEEK MEDICAL CARE IF:   Your port does not flush or you are unable to get a blood return.   You have a fever or chills. SEEK IMMEDIATE MEDICAL CARE IF:  You have new fluid or pus coming from your incision.   You notice a bad smell coming from your incision site.   You have swelling, pain, or more redness at the incision or port site.   You have chest pain or shortness of breath. Document Released: 10/10/2012 Document Revised: 12/25/2012 Document Reviewed: 10/10/2012 ExitCare Patient Information 2015 [employer]. This information is not intended to replace  advice given to you by your health care provider. Make sure you discuss any questions you have with your health care provider.  

## 2013-12-06 NOTE — Anesthesia Preprocedure Evaluation (Signed)
Anesthesia Evaluation  Patient identified by MRN, date of birth, ID band Patient awake    Reviewed: Allergy & Precautions, H&P , NPO status , Patient's Chart, lab work & pertinent test results, reviewed documented beta blocker date and time   Airway Mallampati: II  TM Distance: >3 FB     Dental  (+) Edentulous Upper, Partial Lower   Pulmonary former smoker,  breath sounds clear to auscultation        Cardiovascular hypertension, Pt. on home beta blockers and Pt. on medications Rhythm:Regular Rate:Normal     Neuro/Psych PSYCHIATRIC DISORDERS Anxiety    GI/Hepatic negative GI ROS,   Endo/Other    Renal/GU      Musculoskeletal  (+) Arthritis -,   Abdominal   Peds  Hematology   Anesthesia Other Findings   Reproductive/Obstetrics                             Anesthesia Physical Anesthesia Plan  ASA: II  Anesthesia Plan: MAC   Post-op Pain Management:    Induction: Intravenous  Airway Management Planned: Simple Face Mask  Additional Equipment:   Intra-op Plan:   Post-operative Plan:   Informed Consent: I have reviewed the patients History and Physical, chart, labs and discussed the procedure including the risks, benefits and alternatives for the proposed anesthesia with the patient or authorized representative who has indicated his/her understanding and acceptance.     Plan Discussed with:   Anesthesia Plan Comments:         Anesthesia Quick Evaluation

## 2013-12-06 NOTE — Anesthesia Postprocedure Evaluation (Signed)
  Anesthesia Post-op Note  Patient: Tiffany Sweeney  Procedure(s) Performed: Procedure(s): INSERTION PORT-A-CATH-Right subclavian (Right)  Patient Location: PACU  Anesthesia Type:MAC  Level of Consciousness: awake, alert , oriented and patient cooperative  Airway and Oxygen Therapy: Patient Spontanous Breathing  Post-op Pain: 1 /10, mild  Post-op Assessment: Post-op Vital signs reviewed, Patient's Cardiovascular Status Stable, Respiratory Function Stable, Patent Airway, No signs of Nausea or vomiting and Pain level controlled  Post-op Vital Signs: Reviewed and stable  Last Vitals:  Filed Vitals:   12/06/13 0845  BP: 140/75  Resp: 18    Complications: No apparent anesthesia complications

## 2013-12-06 NOTE — Transfer of Care (Signed)
Immediate Anesthesia Transfer of Care Note  Patient: Tiffany Sweeney  Procedure(s) Performed: Procedure(s): INSERTION PORT-A-CATH-Right subclavian (Right)  Patient Location: PACU  Anesthesia Type:MAC  Level of Consciousness: awake and patient cooperative  Airway & Oxygen Therapy: Patient Spontanous Breathing and Patient connected to face mask oxygen  Post-op Assessment: Report given to PACU RN, Post -op Vital signs reviewed and stable and Patient moving all extremities  Post vital signs: Reviewed and stable  Complications: No apparent anesthesia complications

## 2013-12-06 NOTE — Interval H&P Note (Signed)
History and Physical Interval Note:  12/06/2013 8:45 AM  Tiffany Sweeney  has presented today for surgery, with the diagnosis of left breast cancer  The various methods of treatment have been discussed with the patient and family. After consideration of risks, benefits and other options for treatment, the patient has consented to  Procedure(s): INSERTION PORT-A-CATH (Right) as a surgical intervention .  The patient's history has been reviewed, patient examined, no change in status, stable for surgery.  I have reviewed the patient's chart and labs.  Questions were answered to the patient's satisfaction.     Aviva Signs A

## 2013-12-06 NOTE — Op Note (Signed)
Patient:  Tiffany Sweeney  DOB:  November 05, 1934  MRN:  [id number]   Preop Diagnosis:  Left breast carcinoma, need for central venous access  Postop Diagnosis:  Same  Procedure:  Port-A-Cath insertion  Surgeon:  Aviva Signs, M.D.  Anes:  Mac  Indications:  Patient is a 79 year old white female who is about to undergo chemotherapy for treatment of left breast carcinoma. She needs central venous access. The risks and benefits of the procedure including bleeding, infection, and pneumothorax were fully explained to the patient, who gave informed consent.  Procedure note:  The patient was placed in the Trendelenburg position after the right upper chest was prepped and draped using usual sterile technique with ChloraPrep. Surgical site confirmation was performed. 1% Xylocaine was used for local anesthesia.  An incision was made low the right clavicle. A subcutaneous pocket was then formed. A needle is advanced into the right subclavian vein using the Seldinger technique without difficulty. A guidewire was then advanced into the right atrium under fluoroscopic guidance. An introducer and peel-away sheath were placed over the guidewire. The catheter was then inserted through the peel-away sheath and the peel-away sheath was removed. The catheter was attached to the port and the port placed in its subcutaneous pocket. Adequate backflow of blood was noted from the port. The port was flushed with heparin flush. The subcutaneous layer was reapproximated using 3-0 Vicryl interrupted suture. The skin was closed using a 4-0 Vicryl subcuticular suture. Dermabond was then applied.  All tape and needle counts were correct the end of the procedure. Patient was transferred to PACU in stable condition. A chest x-ray will be performed at that time.  Complications:  None  EBL:  Minimal  Specimen:  None

## 2013-12-09 ENCOUNTER — Other Ambulatory Visit: Payer: Medicare Other | Admitting: Lab

## 2013-12-09 ENCOUNTER — Encounter (HOSPITAL_COMMUNITY): Payer: Self-pay | Admitting: General Surgery

## 2013-12-09 ENCOUNTER — Ambulatory Visit: Payer: Medicare Other | Admitting: Hematology & Oncology

## 2013-12-10 ENCOUNTER — Telehealth (HOSPITAL_COMMUNITY): Payer: Self-pay | Admitting: *Deleted

## 2013-12-10 NOTE — Telephone Encounter (Signed)
-----   Message from Farrel Gobble, MD sent at 12/10/2013  6:47 AM EST ----- Baby aspirin is okay. Alternative would be Aleve 1 tablet every 12 hours. ----- Message -----    From: Lacie Draft, RN    Sent: 12/09/2013   4:21 PM      To: Farrel Gobble, MD  FYI and a question @ the end:  Area on left wrist was bothering patient prior to left mastectomy  Patient went on Aspirin for her wrist per Dr. Martha Clan prior to the mastectomy and came off of it 1 week prior to mastectomy  Now patient was having trouble again with her left wrist - she can feel something that is there and it burns a little - She saw Dr. Karie Kirks PCP today who looked @ it and he told her to take a Baby ASA daily. She leaves for [place] on Friday.  She states that there is no swelling or redness in that area.   Is this something that we need to see prior to her leaving and is a baby ASA daily ok with you?

## 2013-12-10 NOTE — Telephone Encounter (Signed)
Patient notified via voicemail that she could take a Baby ASA daily or she could take 1 Aleve tablet twice a day.

## 2013-12-22 NOTE — Progress Notes (Signed)
Tiffany Bellow, MD [address] [place] [address]  Stage I triple negative carcinoma of the left breast s/p mastectomy and axillary LN disssection.  CURRENT THERAPY: TC started on 12/20/2013  INTERVAL HISTORY: Tiffany Sweeney returns today. She just got back from [place]. She is here to start cycle 1 of Taxotere and Cytoxan. Her biggest concern is that she has seen several oncologists and she wants to make sure everybody is on the "same page".  She has no major complaints. This is her second breast cancer. She underwent for genetic testing 17 years ago but states she opted not to pursue it. Her daughter is here with her today and states she does not want to know if her mother's breast cancer is hereditary.  Past Medical History  Diagnosis Date  . High cholesterol   . Hypertension   . Anxiety   . Cancer     breast- right  . Arthritis   . Breast cancer     has Low back pain and Breast cancer on her problem list.      Breast cancer   07/25/2013 Imaging Plain film of the lumbar spine, complete 4 view. Chronic lumbar degenerative change most prominent at L4-5. No acute abnormality and no change from prior study   10/21/2013 Initial Diagnosis Left breast cancer, core biopsy, ER negative, PR negative, HER-2/neu negative, Ki-67 of 13%, infiltrating duct cell   11/04/2013 Surgery Left modified radical mastectomy, 1.8 cm infiltrating duct cell carcinoma, ER negative, PR negative, HER-2/neu not overexpressed, 9 lymph nodes negative. Stage TI cN0 M0.   12/06/2013 Imaging Portable chest x-ray showing no pneumothorax no lung edema or consolidation Port-A-Cath tip in the SVC.   12/23/2013 -  Chemotherapy Cycle 1 of TC initiated on 12/23/2013     has No Known Allergies.  Tiffany Sweeney does not currently have medications on file.  Past Surgical History  Procedure Laterality Date  . Abdominal hysterectomy    . Mastectomy Right   . Cataract extraction w/phaco Left 03/11/2013   Procedure: CATARACT EXTRACTION PHACO AND INTRAOCULAR LENS PLACEMENT (IOC);  Surgeon: Tonny Branch, MD;  Location: [facility];  Service: Ophthalmology;  Laterality: Left;  CDE:7.54  . Cataract extraction w/phaco Right 04/15/2013    Procedure: CATARACT EXTRACTION PHACO AND INTRAOCULAR LENS PLACEMENT (IOC);  Surgeon: Tonny Branch, MD;  Location: [facility];  Service: Ophthalmology;  Laterality: Right;  CDE 9.38  . Mastectomy modified radical Left 11/04/2013    Procedure: LEFT MASTECTOMY MODIFIED RADICAL;  Surgeon: Jamesetta So, MD;  Location: [facility];  Service: General;  Laterality: Left;  . Portacath placement Right 12/06/2013    Procedure: INSERTION PORT-A-CATH-Right subclavian;  Surgeon: Jamesetta So, MD;  Location: [facility];  Service: General;  Laterality: Right;    Review of Systems  Constitutional: Negative for fever, chills, weight loss, malaise/fatigue and diaphoresis.  HENT: Negative for congestion, ear discharge, ear pain, hearing loss, nosebleeds, sore throat and tinnitus.   Eyes: Negative for blurred vision, double vision, photophobia, pain, discharge and redness.  Respiratory: Negative for cough, hemoptysis, sputum production, shortness of breath, wheezing and stridor.   Cardiovascular: Negative.  Negative for chest pain and palpitations.  Gastrointestinal: Negative for heartburn, nausea, vomiting, abdominal pain, diarrhea, constipation, blood in stool and melena.  Genitourinary: Negative for dysuria, urgency, frequency, hematuria and flank pain.  Musculoskeletal: Positive for back pain. Negative for myalgias, joint pain and falls.  Skin: Negative for itching and rash.  Neurological: Negative for dizziness, tingling, tremors, sensory  change, speech change, focal weakness, seizures, loss of consciousness, weakness and headaches.  Endo/Heme/Allergies: Negative for environmental allergies and polydipsia. Does not bruise/bleed easily.  Psychiatric/Behavioral: Negative for depression and suicidal ideas.  The patient is not nervous/anxious and does not have insomnia.     PHYSICAL EXAMINATION  ECOG PERFORMANCE STATUS: 0 - Asymptomatic  Filed Vitals:   12/23/13 0800  BP: 182/73  Pulse: 65  Temp: 97.5 F (36.4 C)  Resp: 18    Physical Exam  Constitutional: She is oriented to person, place, and time and well-developed, well-nourished, and in no distress. No distress.  HENT:  Head: Normocephalic and atraumatic.  Nose: Nose normal.  Mouth/Throat: Oropharynx is clear and moist. No oropharyngeal exudate.  Eyes: Conjunctivae are normal. Pupils are equal, round, and reactive to light. Right eye exhibits no discharge. Left eye exhibits no discharge. No scleral icterus.  Neck: Normal range of motion. No tracheal deviation present. No thyromegaly present.  Cardiovascular: Normal rate and regular rhythm.   No murmur heard. Pulmonary/Chest: Effort normal and breath sounds normal. No respiratory distress. She has no wheezes. She has no rales. She exhibits no tenderness.  Abdominal: Soft. She exhibits no distension and no mass. There is no tenderness. There is no rebound and no guarding.  Musculoskeletal: Normal range of motion. She exhibits edema. She exhibits no tenderness.  Chronic LE edema R>L  Lymphadenopathy:    She has no cervical adenopathy.  Neurological: She is alert and oriented to person, place, and time. No cranial nerve deficit. Coordination normal.  Skin: Skin is warm and dry. No erythema.  Psychiatric: Memory, affect and judgment normal.    LABORATORY DATA: CBC    Component Value Date/Time   WBC 12.8* 12/23/2013 0823   WBC 6.7 10/10/2013 1010   WBC 7.9 07/06/2006 1141   RBC 4.34 12/23/2013 0823   RBC 4.70 10/10/2013 1010   RBC 4.45 07/06/2006 1141   HGB 12.8 12/23/2013 0823   HGB 14.0 10/10/2013 1010   HGB 13.0 07/06/2006 1141   HCT 38.7 12/23/2013 0823   HCT 41.8 10/10/2013 1010   HCT 38.2 07/06/2006 1141   PLT 256 12/23/2013 0823   PLT 220 10/10/2013 1010   PLT  272 07/06/2006 1141   MCV 89.2 12/23/2013 0823   MCV 89 10/10/2013 1010   MCV 85.8 07/06/2006 1141   MCH 29.5 12/23/2013 0823   MCH 29.8 10/10/2013 1010   MCH 29.3 07/06/2006 1141   MCHC 33.1 12/23/2013 0823   MCHC 33.5 10/10/2013 1010   MCHC 34.1 07/06/2006 1141   RDW 13.0 12/23/2013 0823   RDW 13.3 10/10/2013 1010   RDW 11.2* 07/06/2006 1141   LYMPHSABS 0.8 12/23/2013 0823   LYMPHSABS 2.0 10/10/2013 1010   LYMPHSABS 2.1 07/06/2006 1141   MONOABS 0.7 12/23/2013 0823   MONOABS 0.6 07/06/2006 1141   EOSABS 0.0 12/23/2013 0823   EOSABS 0.1 10/10/2013 1010   EOSABS 0.1 07/06/2006 1141   BASOSABS 0.0 12/23/2013 0823   BASOSABS 0.0 10/10/2013 1010   BASOSABS 0.1 07/06/2006 1141    CMP     Component Value Date/Time   NA 142 12/23/2013 0823   K 4.0 12/23/2013 0823   CL 103 12/23/2013 0823   CO2 25 12/23/2013 0823   GLUCOSE 134* 12/23/2013 0823   BUN 20 12/23/2013 0823   CREATININE 0.73 12/23/2013 0823   CALCIUM 9.3 12/23/2013 0823   PROT 6.9 12/23/2013 0823   ALBUMIN 3.5 12/23/2013 0823   AST 16 12/23/2013 0823   ALT  15 12/23/2013 0823   ALKPHOS 73 12/23/2013 0823   BILITOT 0.3 12/23/2013 0823   GFRNONAA 80* 12/23/2013 0823   GFRAA >90 12/23/2013 0823    PATHOLOGY:  Patient: Tiffany Sweeney, Tiffany Sweeney Collected: 11/04/2013 Client: [facility] Accession: [id number] Received: 11/04/2013 Aviva Signs DOB: Feb 08, 1934 Age: 79 Gender: F Reported: 11/06/2013 [address] Patient Ph: [phone] MRN #: [id number] [place], [place] [address] Visit #: [id number] Chart #: Phone: [phone] Fax: CC: REPORT OF SURGICAL PATHOLOGY ADDITIONAL INFORMATION: The tumor cells are strongly and diffusely positive for Androgen receptor and GCDFP-15 with appropriate controls. Aldona Bar MD Pathologist, Electronic Signature ( Signed 11/21/2013) CHROMOGENIC IN-SITU HYBRIDIZATION Results: HER-2/NEU BY CISH - NO AMPLIFICATION OF HER-2 DETECTED. RESULT RATIO OF HER2: CEP 17  SIGNALS 0.89 AVERAGE HER2 COPY NUMBER PER CELL 1.60 REFERENCE RANGE NEGATIVE HER2/Chr17 Ratio <2.0 and Average HER2 copy number <4.0 EQUIVOCAL HER2/Chr17 Ratio <2.0 and Average HER2 copy number 4.0 and <6.0 POSITIVE HER2/Chr17 Ratio >=2.0 and/or Average HER2 copy number >=6.0 Claudette Laws MD Pathologist, Electronic Signature ( Signed 11/13/2013) PROGNOSTIC INDICATORS  THERAPY PLAN:  Breast cancer 79 year old female with a history of breast cancer in the right breast, treated with mastectomy and tamoxifen. She now presents with newly diagnosed Stage I (T1aN0)  triple negative carcinoma of the left breast is here today to receive cycle 1 of chemotherapy with Taxotere and Cytoxan She has an excellent performance status and she is very active. I spent time discussing risks and benefits of chemotherapy, including but not limited to fever, infection, nausea, vomiting, hair loss. We discussed different types of breast cancer, specifically triple negative disease. I again addressed genetic testing and spent some time talking to she and her daughter in regards to the benefits of genetic testing.Darnice had a sister that died from ovarian cancer. She will begin cycle 1 of TC today. She will receive Neulasta 24-72 hours post chemotherapy. And she will return in one week to assess tolerance. I advised her to call in the interim with any questions problems or concerns    All questions were answered. The patient knows to call the clinic with any problems, questions or concerns. We can certainly see the patient much sooner if necessary.  Molli Hazard 12/23/2013

## 2013-12-23 ENCOUNTER — Encounter (HOSPITAL_COMMUNITY): Payer: Medicare Other | Attending: Hematology and Oncology | Admitting: Hematology & Oncology

## 2013-12-23 ENCOUNTER — Encounter (HOSPITAL_COMMUNITY): Payer: Self-pay | Admitting: Hematology & Oncology

## 2013-12-23 ENCOUNTER — Encounter (HOSPITAL_BASED_OUTPATIENT_CLINIC_OR_DEPARTMENT_OTHER): Payer: Medicare Other

## 2013-12-23 VITALS — BP 182/73 | HR 65 | Temp 97.5°F | Resp 18 | Wt 156.2 lb

## 2013-12-23 DIAGNOSIS — C50912 Malignant neoplasm of unspecified site of left female breast: Secondary | ICD-10-CM

## 2013-12-23 DIAGNOSIS — Z171 Estrogen receptor negative status [ER-]: Secondary | ICD-10-CM

## 2013-12-23 DIAGNOSIS — M810 Age-related osteoporosis without current pathological fracture: Secondary | ICD-10-CM | POA: Insufficient documentation

## 2013-12-23 DIAGNOSIS — C50412 Malignant neoplasm of upper-outer quadrant of left female breast: Secondary | ICD-10-CM

## 2013-12-23 DIAGNOSIS — Z170 Estrogen receptor positive status [ER+]: Secondary | ICD-10-CM | POA: Diagnosis not present

## 2013-12-23 DIAGNOSIS — C50911 Malignant neoplasm of unspecified site of right female breast: Secondary | ICD-10-CM | POA: Diagnosis present

## 2013-12-23 DIAGNOSIS — I10 Essential (primary) hypertension: Secondary | ICD-10-CM | POA: Insufficient documentation

## 2013-12-23 DIAGNOSIS — Z5111 Encounter for antineoplastic chemotherapy: Secondary | ICD-10-CM

## 2013-12-23 LAB — CBC WITH DIFFERENTIAL/PLATELET
BASOS ABS: 0 10*3/uL (ref 0.0–0.1)
BASOS PCT: 0 % (ref 0–1)
EOS ABS: 0 10*3/uL (ref 0.0–0.7)
Eosinophils Relative: 0 % (ref 0–5)
HCT: 38.7 % (ref 36.0–46.0)
Hemoglobin: 12.8 g/dL (ref 12.0–15.0)
Lymphocytes Relative: 6 % — ABNORMAL LOW (ref 12–46)
Lymphs Abs: 0.8 10*3/uL (ref 0.7–4.0)
MCH: 29.5 pg (ref 26.0–34.0)
MCHC: 33.1 g/dL (ref 30.0–36.0)
MCV: 89.2 fL (ref 78.0–100.0)
Monocytes Absolute: 0.7 10*3/uL (ref 0.1–1.0)
Monocytes Relative: 5 % (ref 3–12)
NEUTROS PCT: 89 % — AB (ref 43–77)
Neutro Abs: 11.3 10*3/uL — ABNORMAL HIGH (ref 1.7–7.7)
PLATELETS: 256 10*3/uL (ref 150–400)
RBC: 4.34 MIL/uL (ref 3.87–5.11)
RDW: 13 % (ref 11.5–15.5)
WBC: 12.8 10*3/uL — ABNORMAL HIGH (ref 4.0–10.5)

## 2013-12-23 LAB — COMPREHENSIVE METABOLIC PANEL
ALBUMIN: 3.5 g/dL (ref 3.5–5.2)
ALK PHOS: 73 U/L (ref 39–117)
ALT: 15 U/L (ref 0–35)
AST: 16 U/L (ref 0–37)
Anion gap: 14 (ref 5–15)
BUN: 20 mg/dL (ref 6–23)
CHLORIDE: 103 meq/L (ref 96–112)
CO2: 25 mEq/L (ref 19–32)
Calcium: 9.3 mg/dL (ref 8.4–10.5)
Creatinine, Ser: 0.73 mg/dL (ref 0.50–1.10)
GFR calc Af Amer: >90 mL/min (ref >90)
GFR calc non Af Amer: 80 mL/min — ABNORMAL LOW (ref >90)
Glucose, Bld: 134 mg/dL — ABNORMAL HIGH (ref 70–99)
POTASSIUM: 4 meq/L (ref 3.7–5.3)
SODIUM: 142 meq/L (ref 137–147)
Total Bilirubin: 0.3 mg/dL (ref 0.3–1.2)
Total Protein: 6.9 g/dL (ref 6.0–8.3)

## 2013-12-23 MED ORDER — HEPARIN SOD (PORK) LOCK FLUSH 100 UNIT/ML IV SOLN
500.0000 [IU] | Freq: Once | INTRAVENOUS | Status: AC | PRN
  Administered 2013-12-23: 500 [IU]
  Filled 2013-12-23: qty 5

## 2013-12-23 MED ORDER — SODIUM CHLORIDE 0.9 % IV SOLN
Freq: Once | INTRAVENOUS | Status: AC
  Administered 2013-12-23: 09:00:00 via INTRAVENOUS

## 2013-12-23 MED ORDER — SODIUM CHLORIDE 0.9 % IV SOLN
600.0000 mg/m2 | Freq: Once | INTRAVENOUS | Status: AC
  Administered 2013-12-23: 1080 mg via INTRAVENOUS
  Filled 2013-12-23: qty 50

## 2013-12-23 MED ORDER — PALONOSETRON HCL INJECTION 0.25 MG/5ML
0.2500 mg | Freq: Once | INTRAVENOUS | Status: AC
  Administered 2013-12-23: 0.25 mg via INTRAVENOUS
  Filled 2013-12-23: qty 5

## 2013-12-23 MED ORDER — DOCETAXEL CHEMO INJECTION 160 MG/16ML
75.0000 mg/m2 | Freq: Once | INTRAVENOUS | Status: AC
  Administered 2013-12-23: 140 mg via INTRAVENOUS
  Filled 2013-12-23: qty 14

## 2013-12-23 MED ORDER — SODIUM CHLORIDE 0.9 % IJ SOLN
10.0000 mL | INTRAMUSCULAR | Status: DC | PRN
  Administered 2013-12-23: 10 mL
  Filled 2013-12-23: qty 10

## 2013-12-23 MED ORDER — DEXAMETHASONE SODIUM PHOSPHATE 10 MG/ML IJ SOLN: 20.0000 mg | Freq: Once | INTRAMUSCULAR | Status: DC

## 2013-12-23 MED ORDER — SODIUM CHLORIDE 0.9 % IV SOLN: 150.0000 mg | Freq: Once | INTRAVENOUS | Status: DC

## 2013-12-23 MED ORDER — SODIUM CHLORIDE 0.9 % IV SOLN
Freq: Once | INTRAVENOUS | Status: AC
  Administered 2013-12-23: 10:00:00 via INTRAVENOUS
  Filled 2013-12-23: qty 5

## 2013-12-23 NOTE — Patient Instructions (Signed)
[facility] Discharge Instructions for Patients Receiving Chemotherapy  Today you received the following chemotherapy agents Taxotere/Cytoxan  To help prevent nausea and vomiting after your treatment, we encourage you to take your nausea medications as prescribed   If you develop nausea and vomiting that is not controlled by your nausea medication, call the clinic.   BELOW ARE SYMPTOMS THAT SHOULD BE REPORTED IMMEDIATELY:  *FEVER GREATER THAN 100.5 F  *CHILLS WITH OR WITHOUT FEVER  NAUSEA AND VOMITING THAT IS NOT CONTROLLED WITH YOUR NAUSEA MEDICATION  *UNUSUAL SHORTNESS OF BREATH  *UNUSUAL BRUISING OR BLEEDING  TENDERNESS IN MOUTH AND THROAT WITH OR WITHOUT PRESENCE OF ULCERS  *URINARY PROBLEMS  *BOWEL PROBLEMS  UNUSUAL RASH Items with * indicate a potential emergency and should be followed up as soon as possible.  Feel free to call the clinic you have any questions or concerns. The clinic phone number is [phone].   [facility] Discharge Instructions  Remember to call with any problems or concerns. We will see you back in one week to check on how you did. Bring questions to your next appointment.   Thank you for choosing [facility] to provide your oncology and hematology care.  To afford each patient quality time with our providers, please arrive at least 15 minutes before your scheduled appointment time.  With your help, our goal is to use those 15 minutes to complete the necessary work-up to ensure our physicians have the information they need to help with your evaluation and healthcare recommendations.    Effective January 1st, 2014, we ask that you re-schedule your appointment with our physicians should you arrive 10 or more minutes late for your appointment.  We strive to give you quality time with our providers, and arriving late affects you and other patients whose appointments are after yours.    Again,  thank you for choosing [facility].  Our hope is that these requests will decrease the amount of time that you wait before being seen by our physicians.       _____________________________________________________________  Should you have questions after your visit to [facility], please contact our office at [phone] between the hours of 8:30 a.m. and 5:00 p.m.  Voicemails left after 4:30 p.m. will not be returned until the following business day.  For prescription refill requests, have your pharmacy contact our office with your prescription refill request.

## 2013-12-23 NOTE — Assessment & Plan Note (Addendum)
79 year old female with a history of breast cancer in the right breast, treated with mastectomy and tamoxifen. She now presents with newly diagnosed Stage I (T1aN0)  triple negative carcinoma of the left breast is here today to receive cycle 1 of chemotherapy with Taxotere and Cytoxan She has an excellent performance status and she is very active. I spent time discussing risks and benefits of chemotherapy, including but not limited to fever, infection, nausea, vomiting, hair loss. We discussed different types of breast cancer, specifically triple negative disease. I again addressed genetic testing and spent some time talking to she and her daughter in regards to the benefits of genetic testing.Tiffany Sweeney had a sister that died from ovarian cancer. She will begin cycle 1 of TC today. She will receive Neulasta 24-72 hours post chemotherapy. And she will return in one week to assess tolerance. I advised her to call in the interim with any questions problems or concerns

## 2013-12-23 NOTE — Progress Notes (Signed)
1345:  Tolerated infusions w/o adverse reaction; A&Ox4; in no apparent distress.  Left in c/o daughter and sister for transport home.

## 2013-12-25 ENCOUNTER — Encounter (HOSPITAL_BASED_OUTPATIENT_CLINIC_OR_DEPARTMENT_OTHER): Payer: Medicare Other

## 2013-12-25 DIAGNOSIS — C50912 Malignant neoplasm of unspecified site of left female breast: Secondary | ICD-10-CM

## 2013-12-25 MED ORDER — PEGFILGRASTIM INJECTION 6 MG/0.6ML ~~LOC~~
PREFILLED_SYRINGE | SUBCUTANEOUS | Status: AC
  Filled 2013-12-25: qty 0.6

## 2013-12-25 MED ORDER — PEGFILGRASTIM INJECTION 6 MG/0.6ML ~~LOC~~
6.0000 mg | PREFILLED_SYRINGE | Freq: Once | SUBCUTANEOUS | Status: AC
  Administered 2013-12-25: 6 mg via SUBCUTANEOUS

## 2013-12-25 NOTE — Patient Instructions (Signed)
[facility] Discharge Instructions  RECOMMENDATIONS MADE BY THE CONSULTANT AND ANY TEST RESULTS WILL BE SENT TO YOUR REFERRING PHYSICIAN.  MEDICATIONS PRESCRIBED:  Neulasta injection today.  INSTRUCTIONS/FOLLOW-UP: Return as scheduled.  Thank you for choosing [facility] to provide your oncology and hematology care.  To afford each patient quality time with our providers, please arrive at least 15 minutes before your scheduled appointment time.  With your help, our goal is to use those 15 minutes to complete the necessary work-up to ensure our physicians have the information they need to help with your evaluation and healthcare recommendations.    Effective January 1st, 2014, we ask that you re-schedule your appointment with our physicians should you arrive 10 or more minutes late for your appointment.  We strive to give you quality time with our providers, and arriving late affects you and other patients whose appointments are after yours.    Again, thank you for choosing [facility].  Our hope is that these requests will decrease the amount of time that you wait before being seen by our physicians.       _____________________________________________________________  Should you have questions after your visit to [facility], please contact our office at [phone] between the hours of 8:30 a.m. and 4:30 p.m.  Voicemails left after 4:30 p.m. will not be returned until the following business day.  For prescription refill requests, have your pharmacy contact our office with your prescription refill request.    _______________________________________________________________  We hope that we have given you very good care.  You may receive a patient satisfaction survey in the mail, please complete it and return it as soon as possible.  We value your feedback!  _______________________________________________________________  Have  you asked about our STAR program?  STAR stands for Survivorship Training and Rehabilitation, and this is a nationally recognized cancer care program that focuses on survivorship and rehabilitation.  Cancer and cancer treatments may cause problems, such as, pain, making you feel tired and keeping you from doing the things that you need or want to do. Cancer rehabilitation can help. Our goal is to reduce these troubling effects and help you have the best quality of life possible.  You may receive a survey from a nurse that asks questions about your current state of health.  Based on the survey results, all eligible patients will be referred to the [facility] program for an evaluation so we can better serve you!  A frequently asked questions sheet is available upon request.

## 2013-12-25 NOTE — Progress Notes (Signed)
Denies complaints following chemo. Reports she may have felt slightly nauseated last pm and took compazine with relief. No other problems to note. Tiffany Sweeney presents today for injection per MD orders. Neulasta 6mg  administered SQ in right Abdomen. Administration without incident. Patient tolerated well.

## 2013-12-29 NOTE — Progress Notes (Signed)
   KNOWLTON,STEPHEN D, MD [address] [place] [address]  Stage I triple negative carcinoma of the left breast s/p mastectomy and axillary LN disssection.  CURRENT THERAPY: TC started on 12/20/2013   INTERVAL HISTORY: Tiffany Sweeney 79 y.o. female returns for follow-up of her breast cancer and first cycle of TC.  She feels like something is between her feet and the floor. Denies numbness or tingling. Her fingers are ok. No appetite, weight is down 6 lbs. She reports a lot of aching after the neulasta injection. She only took aleve but still had 2 terrible days. She did not take her hydrocodone.  MEDICAL HISTORY: Past Medical History  Diagnosis Date  . High cholesterol   . Hypertension   . Anxiety   . Cancer     breast- right  . Arthritis   . Breast cancer     has Low back pain and Breast cancer on her problem list.      Breast cancer   07/25/2013 Imaging Plain film of the lumbar spine, complete 4 view. Chronic lumbar degenerative change most prominent at L4-5. No acute abnormality and no change from prior study   10/21/2013 Initial Diagnosis Left breast cancer, core biopsy, ER negative, PR negative, HER-2/neu negative, Ki-67 of 13%, infiltrating duct cell   11/04/2013 Surgery Left modified radical mastectomy, 1.8 cm infiltrating duct cell carcinoma, ER negative, PR negative, HER-2/neu not overexpressed, 9 lymph nodes negative. Stage TI cN0 M0.   12/06/2013 Imaging Portable chest x-ray showing no pneumothorax no lung edema or consolidation Port-A-Cath tip in the SVC.   12/23/2013 -  Chemotherapy Cycle 1 of TC initiated on 12/23/2013     has No Known Allergies.  We administered heparin lock flush and sodium chloride.  SURGICAL HISTORY: Past Surgical History  Procedure Laterality Date  . Abdominal hysterectomy    . Mastectomy Right   . Cataract extraction w/phaco Left 03/11/2013    Procedure: CATARACT EXTRACTION PHACO AND INTRAOCULAR LENS PLACEMENT (IOC);   Surgeon: Kerry Hunt, MD;  Location: [facility];  Service: Ophthalmology;  Laterality: Left;  CDE:7.54  . Cataract extraction w/phaco Right 04/15/2013    Procedure: CATARACT EXTRACTION PHACO AND INTRAOCULAR LENS PLACEMENT (IOC);  Surgeon: Kerry Hunt, MD;  Location: [facility];  Service: Ophthalmology;  Laterality: Right;  CDE 9.38  . Mastectomy modified radical Left 11/04/2013    Procedure: LEFT MASTECTOMY MODIFIED RADICAL;  Surgeon: Mark A Jenkins, MD;  Location: [facility];  Service: General;  Laterality: Left;  . Portacath placement Right 12/06/2013    Procedure: INSERTION PORT-A-CATH-Right subclavian;  Surgeon: Mark A Jenkins, MD;  Location: [facility];  Service: General;  Laterality: Right;    SOCIAL HISTORY: History   Social History  . Marital Status: Widowed    Spouse Name: N/A    Number of Children: N/A  . Years of Education: N/A   Occupational History  . Not on file.   Social History Main Topics  . Smoking status: Former Smoker -- 0.25 packs/day for 30 years    Types: Cigarettes    Start date: 10/10/1956    Quit date: 03/08/1985  . Smokeless tobacco: Never Used     Comment: quit 25 years ago  . Alcohol Use: No  . Drug Use: No  . Sexual Activity: Yes    Birth Control/ Protection: Post-menopausal   Other Topics Concern  . Not on file   Social History Narrative    FAMILY HISTORY: Family History  Problem Relation Age of Onset  .   Cancer Mother     died at 84, had anal cancer but died from complications of diabetes  . Diabetes Mother   . Dementia Father     Died at 87 in an accident  . Pancreatic cancer Brother     age 57  . Lung cancer Sister     age 50  . Ovarian cancer Sister     age 66    Review of Systems  Constitutional: Positive for weight loss and malaise/fatigue. Negative for fever, chills and diaphoresis.  HENT: Negative.   Eyes: Negative.   Respiratory: Negative.   Cardiovascular: Negative.   Gastrointestinal: Negative for heartburn, nausea, vomiting, abdominal  pain and blood in stool.       Had one episode of diarrhea, but had taken colace and 2 Tablespoons MOM prior  Genitourinary: Negative.   Musculoskeletal: Positive for myalgias, back pain and joint pain.  Skin: Negative.   Neurological: Positive for weakness.  Endo/Heme/Allergies: Negative.   Psychiatric/Behavioral: Negative.     PHYSICAL EXAMINATION  ECOG PERFORMANCE STATUS: 1 - Symptomatic but completely ambulatory  Filed Vitals:   12/30/13 1200  BP: 142/86  Pulse: 85  Temp: 99.2 F (37.3 C)  Resp: 16    Physical Exam  Constitutional: She is oriented to person, place, and time and well-developed, well-nourished, and in no distress.  HENT:  Head: Normocephalic and atraumatic.  Nose: Nose normal.  Mouth/Throat: Oropharynx is clear and moist. No oropharyngeal exudate.  Eyes: Conjunctivae and EOM are normal. Pupils are equal, round, and reactive to light. Right eye exhibits no discharge. Left eye exhibits no discharge. No scleral icterus.  Neck: Normal range of motion. Neck supple. No tracheal deviation present. No thyromegaly present.  Cardiovascular: Normal rate, regular rhythm and normal heart sounds.  Exam reveals no gallop and no friction rub.   No murmur heard. Pulmonary/Chest: Effort normal and breath sounds normal. She has no wheezes. She has no rales.  Abdominal: Soft. Bowel sounds are normal. She exhibits no distension and no mass. There is no tenderness. There is no rebound and no guarding.  Musculoskeletal: Normal range of motion. She exhibits no edema.  Lymphadenopathy:    She has no cervical adenopathy.  Neurological: She is alert and oriented to person, place, and time. She has normal reflexes. No cranial nerve deficit. Gait normal. Coordination normal.  Skin: Skin is warm and dry. No rash noted.  Psychiatric: Mood, memory, affect and judgment normal.  Nursing note and vitals reviewed.   LABORATORY DATA:  CBC    Component Value Date/Time   WBC 8.5  12/30/2013 1142   WBC 6.7 10/10/2013 1010   WBC 7.9 07/06/2006 1141   RBC 4.55 12/30/2013 1142   RBC 4.70 10/10/2013 1010   RBC 4.45 07/06/2006 1141   HGB 13.4 12/30/2013 1142   HGB 14.0 10/10/2013 1010   HGB 13.0 07/06/2006 1141   HCT 40.7 12/30/2013 1142   HCT 41.8 10/10/2013 1010   HCT 38.2 07/06/2006 1141   PLT 163 12/30/2013 1142   PLT 220 10/10/2013 1010   PLT 272 07/06/2006 1141   MCV 89.5 12/30/2013 1142   MCV 89 10/10/2013 1010   MCV 85.8 07/06/2006 1141   MCH 29.5 12/30/2013 1142   MCH 29.8 10/10/2013 1010   MCH 29.3 07/06/2006 1141   MCHC 32.9 12/30/2013 1142   MCHC 33.5 10/10/2013 1010   MCHC 34.1 07/06/2006 1141   RDW 13.2 12/30/2013 1142   RDW 13.3 10/10/2013 1010   RDW 11.2* 07/06/2006   1141   LYMPHSABS 1.2 12/30/2013 1142   LYMPHSABS 2.0 10/10/2013 1010   LYMPHSABS 2.1 07/06/2006 1141   MONOABS 1.7* 12/30/2013 1142   MONOABS 0.6 07/06/2006 1141   EOSABS 0.0 12/30/2013 1142   EOSABS 0.1 10/10/2013 1010   EOSABS 0.1 07/06/2006 1141   BASOSABS 0.3* 12/30/2013 1142   BASOSABS 0.0 10/10/2013 1010   BASOSABS 0.1 07/06/2006 1141   CMP     Component Value Date/Time   NA 139 12/30/2013 1142   K 3.7 12/30/2013 1142   CL 103 12/30/2013 1142   CO2 30 12/30/2013 1142   GLUCOSE 153* 12/30/2013 1142   BUN 13 12/30/2013 1142   CREATININE 0.85 12/30/2013 1142   CALCIUM 9.0 12/30/2013 1142   PROT 6.5 12/30/2013 1142   ALBUMIN 3.4* 12/30/2013 1142   AST 30 12/30/2013 1142   ALT 27 12/30/2013 1142   ALKPHOS 80 12/30/2013 1142   BILITOT 0.6 12/30/2013 1142   GFRNONAA 64* 12/30/2013 1142   GFRAA 75* 12/30/2013 1142       ASSESSMENT and THERAPY PLAN:    Breast cancer This is a pleasant 79 year old female with stage I triple negative carcinoma of the left breast. She has completed cycle 1 of Taxotere and Cytoxan. She is having some neuropathy and this will have to be reassessed prior to cycle 2. She had minimal problems with nausea however reports that her  appetite is for poor secondary to taste. I have discussed with her the importance of trying to eat every day, she can try frequent small meals as that tends to help. She had a lot of bone/musculoskeletal pain after Neulasta. I discussed the use of Claritin as a potential help but in addition strongly encouraged her to use her Norco as needed. She took Aleve but reports "terrible days" and spent those days in bed. I have advised her I would prefer her to be up and active and it is safe for her to intermittently uses Norco as needed. I will see her back prior to cycle 2; we will reassess her neuropathy her weight and any other issues she may be having. She knows to call in the interim with problems or concerns    All questions were answered. The patient knows to call the clinic with any problems, questions or concerns. We can certainly see the patient much sooner if necessary.   Molli Hazard 01/11/2014

## 2013-12-30 ENCOUNTER — Encounter (HOSPITAL_COMMUNITY): Payer: Self-pay | Admitting: Hematology & Oncology

## 2013-12-30 ENCOUNTER — Encounter (HOSPITAL_BASED_OUTPATIENT_CLINIC_OR_DEPARTMENT_OTHER): Payer: Medicare Other | Admitting: Hematology & Oncology

## 2013-12-30 ENCOUNTER — Encounter (HOSPITAL_COMMUNITY): Payer: Medicare Other

## 2013-12-30 VITALS — BP 142/86 | HR 85 | Temp 99.2°F | Resp 16 | Wt 150.0 lb

## 2013-12-30 DIAGNOSIS — C50912 Malignant neoplasm of unspecified site of left female breast: Secondary | ICD-10-CM

## 2013-12-30 DIAGNOSIS — C50911 Malignant neoplasm of unspecified site of right female breast: Secondary | ICD-10-CM | POA: Diagnosis not present

## 2013-12-30 LAB — CBC WITH DIFFERENTIAL/PLATELET
BASOS PCT: 3 % — AB (ref 0–1)
Basophils Absolute: 0.3 10*3/uL — ABNORMAL HIGH (ref 0.0–0.1)
EOS ABS: 0 10*3/uL (ref 0.0–0.7)
Eosinophils Relative: 0 % (ref 0–5)
HEMATOCRIT: 40.7 % (ref 36.0–46.0)
Hemoglobin: 13.4 g/dL (ref 12.0–15.0)
LYMPHS PCT: 14 % (ref 12–46)
Lymphs Abs: 1.2 10*3/uL (ref 0.7–4.0)
MCH: 29.5 pg (ref 26.0–34.0)
MCHC: 32.9 g/dL (ref 30.0–36.0)
MCV: 89.5 fL (ref 78.0–100.0)
Monocytes Absolute: 1.7 10*3/uL — ABNORMAL HIGH (ref 0.1–1.0)
Monocytes Relative: 20 % — ABNORMAL HIGH (ref 3–12)
NEUTROS PCT: 63 % (ref 43–77)
Neutro Abs: 5.3 10*3/uL (ref 1.7–7.7)
Platelets: 163 10*3/uL (ref 150–400)
RBC: 4.55 MIL/uL (ref 3.87–5.11)
RDW: 13.2 % (ref 11.5–15.5)
WBC: 8.5 10*3/uL (ref 4.0–10.5)

## 2013-12-30 LAB — COMPREHENSIVE METABOLIC PANEL
ALT: 27 U/L (ref 0–35)
AST: 30 U/L (ref 0–37)
Albumin: 3.4 g/dL — ABNORMAL LOW (ref 3.5–5.2)
Alkaline Phosphatase: 80 U/L (ref 39–117)
Anion gap: 6 (ref 5–15)
BILIRUBIN TOTAL: 0.6 mg/dL (ref 0.3–1.2)
BUN: 13 mg/dL (ref 6–23)
CO2: 30 mmol/L (ref 19–32)
Calcium: 9 mg/dL (ref 8.4–10.5)
Chloride: 103 mEq/L (ref 96–112)
Creatinine, Ser: 0.85 mg/dL (ref 0.50–1.10)
GFR calc Af Amer: 75 mL/min — ABNORMAL LOW (ref >90)
GFR, EST NON AFRICAN AMERICAN: 64 mL/min — AB (ref >90)
GLUCOSE: 153 mg/dL — AB (ref 70–99)
POTASSIUM: 3.7 mmol/L (ref 3.5–5.1)
SODIUM: 139 mmol/L (ref 135–145)
Total Protein: 6.5 g/dL (ref 6.0–8.3)

## 2013-12-30 MED ORDER — SODIUM CHLORIDE 0.9 % IJ SOLN
10.0000 mL | INTRAMUSCULAR | Status: DC | PRN
  Administered 2013-12-30: 10 mL via INTRAVENOUS
  Filled 2013-12-30: qty 10

## 2013-12-30 MED ORDER — HEPARIN SOD (PORK) LOCK FLUSH 100 UNIT/ML IV SOLN
500.0000 [IU] | Freq: Once | INTRAVENOUS | Status: AC
  Administered 2013-12-30: 500 [IU] via INTRAVENOUS
  Filled 2013-12-30: qty 5

## 2013-12-30 NOTE — Progress Notes (Signed)
Blood drawn via port a cath during office visit.

## 2013-12-30 NOTE — Patient Instructions (Signed)
[facility] Discharge Instructions  RECOMMENDATIONS MADE BY THE CONSULTANT AND ANY TEST RESULTS WILL BE SENT TO YOUR REFERRING PHYSICIAN.  We discussed taking hydrocodone for your neulasta induced discomfort since aleve did not help. I will see you back prior to your next cycle of chemotherapy with labs. If you develop any additional problems or concerns prior to your next f/u make sure to call.  Thank you for choosing [facility] to provide your oncology and hematology care.  To afford each patient quality time with our providers, please arrive at least 15 minutes before your scheduled appointment time.  With your help, our goal is to use those 15 minutes to complete the necessary work-up to ensure our physicians have the information they need to help with your evaluation and healthcare recommendations.    Effective January 1st, 2014, we ask that you re-schedule your appointment with our physicians should you arrive 10 or more minutes late for your appointment.  We strive to give you quality time with our providers, and arriving late affects you and other patients whose appointments are after yours.    Again, thank you for choosing [facility].  Our hope is that these requests will decrease the amount of time that you wait before being seen by our physicians.       _____________________________________________________________  Should you have questions after your visit to [facility], please contact our office at [phone] between the hours of 8:30 a.m. and 5:00 p.m.  Voicemails left after 4:30 p.m. will not be returned until the following business day.  For prescription refill requests, have your pharmacy contact our office with your prescription refill request.

## 2013-12-30 NOTE — Progress Notes (Signed)
Tiffany Sweeney presented for labwork. Labs per MD order drawn via Portacath located in the right chest wall accessed with  H 20 needle. Good blood return present. Procedure without incident.  Portacath flushed with 40ml NS and 500U/76ml Heparin per protocol and needle removed intact. Patient tolerated procedure well.

## 2014-01-06 ENCOUNTER — Telehealth (HOSPITAL_COMMUNITY): Payer: Self-pay | Admitting: *Deleted

## 2014-01-06 NOTE — Telephone Encounter (Signed)
Patient left me a vm letting me know that the bottoms of her feet are very very sensitive especially in the morning but no pain. She did say that she has a few red blotches on her hands (lt hand - 1st finger) (rt posterior hand - 4 small blotches). She thinks she may have a small blotch on her face (rt upper cheek bone) as well. They do not hurt. She said other than this she is doing quite well. Bowels are moving. Fatigue is improved from last week. I instructed pt to use her Udder cream twice a day and more if she can to her hands & feet. She said that she had not been using her Udder cream. I also instructed her to avoid hot/warm temps to her hands/feet (no soaking feet/hands). She said ok. I asked her to call us if these symptoms got worse. She said ok.

## 2014-01-07 ENCOUNTER — Other Ambulatory Visit (HOSPITAL_COMMUNITY): Payer: Self-pay | Admitting: *Deleted

## 2014-01-07 DIAGNOSIS — C50912 Malignant neoplasm of unspecified site of left female breast: Secondary | ICD-10-CM

## 2014-01-07 MED ORDER — HYDROCODONE-ACETAMINOPHEN 5-325 MG PO TABS: 1.0000 | ORAL_TABLET | ORAL | Status: DC | PRN

## 2014-01-08 ENCOUNTER — Telehealth: Payer: Self-pay | Admitting: Nutrition

## 2014-01-08 NOTE — Telephone Encounter (Signed)
Patient was identified to be at risk for malnutrition on the MST secondary to poor appetite and weight loss. Contacted patient by phone.  She reports she feels her weight has stabilized. Patient's weight documented as 150 pounds on her home scale. Patient is trying to eat small amounts throughout the day. Patient is drinking one Ensure daily however does not know if this is a regular or plus product. Educated patient to consume small frequent, high-calorie, high-protein meals. Recommended patient change to Ensure Plus if not already consuming. Educated patient on appropriate snacks and importance of weight maintenance. Will mail fact sheets and coupons to patient. Also provided contact information. Patient expresses appreciation for nutrition information.  **Disclaimer: This note was dictated with voice recognition software. Similar sounding words can inadvertently be transcribed and this note may contain transcription errors which may not have been corrected upon publication of note.**

## 2014-01-10 ENCOUNTER — Ambulatory Visit: Payer: Medicare Other

## 2014-01-10 ENCOUNTER — Ambulatory Visit: Payer: Medicare Other | Admitting: Hematology & Oncology

## 2014-01-10 ENCOUNTER — Other Ambulatory Visit: Payer: Medicare Other | Admitting: Lab

## 2014-01-11 ENCOUNTER — Encounter (HOSPITAL_COMMUNITY): Payer: Self-pay | Admitting: Hematology & Oncology

## 2014-01-11 NOTE — Assessment & Plan Note (Signed)
This is a pleasant 81 year old female with stage I triple negative carcinoma of the left breast. She has completed cycle 1 of Taxotere and Cytoxan. She is having some neuropathy and this will have to be reassessed prior to cycle 2. She had minimal problems with nausea however reports that her appetite is for poor secondary to taste. I have discussed with her the importance of trying to eat every day, she can try frequent small meals as that tends to help. She had a lot of bone/musculoskeletal pain after Neulasta. I discussed the use of Claritin as a potential help but in addition strongly encouraged her to use her Norco as needed. She took Aleve but reports "terrible days" and spent those days in bed. I have advised her I would prefer her to be up and active and it is safe for her to intermittently uses Norco as needed. I will see her back prior to cycle 2; we will reassess her neuropathy her weight and any other issues she may be having. She knows to call in the interim with problems or concerns

## 2014-01-13 ENCOUNTER — Ambulatory Visit (HOSPITAL_COMMUNITY): Payer: Medicare Other | Admitting: Hematology & Oncology

## 2014-01-13 ENCOUNTER — Encounter (HOSPITAL_COMMUNITY): Payer: Medicare Other | Attending: Hematology and Oncology

## 2014-01-13 ENCOUNTER — Encounter (HOSPITAL_BASED_OUTPATIENT_CLINIC_OR_DEPARTMENT_OTHER): Payer: Medicare Other | Admitting: Hematology & Oncology

## 2014-01-13 ENCOUNTER — Encounter (HOSPITAL_COMMUNITY): Payer: Self-pay | Admitting: Hematology & Oncology

## 2014-01-13 VITALS — BP 154/118 | HR 79 | Temp 97.9°F | Resp 18 | Wt 153.2 lb

## 2014-01-13 DIAGNOSIS — C50912 Malignant neoplasm of unspecified site of left female breast: Secondary | ICD-10-CM

## 2014-01-13 DIAGNOSIS — M810 Age-related osteoporosis without current pathological fracture: Secondary | ICD-10-CM | POA: Diagnosis not present

## 2014-01-13 DIAGNOSIS — C50412 Malignant neoplasm of upper-outer quadrant of left female breast: Secondary | ICD-10-CM

## 2014-01-13 DIAGNOSIS — Z170 Estrogen receptor positive status [ER+]: Secondary | ICD-10-CM | POA: Insufficient documentation

## 2014-01-13 DIAGNOSIS — I10 Essential (primary) hypertension: Secondary | ICD-10-CM | POA: Diagnosis not present

## 2014-01-13 DIAGNOSIS — Z5111 Encounter for antineoplastic chemotherapy: Secondary | ICD-10-CM

## 2014-01-13 DIAGNOSIS — C50911 Malignant neoplasm of unspecified site of right female breast: Secondary | ICD-10-CM | POA: Insufficient documentation

## 2014-01-13 LAB — COMPREHENSIVE METABOLIC PANEL
ALK PHOS: 65 U/L (ref 39–117)
ALT: 16 U/L (ref 0–35)
ANION GAP: 6 (ref 5–15)
AST: 19 U/L (ref 0–37)
Albumin: 3.5 g/dL (ref 3.5–5.2)
BILIRUBIN TOTAL: 0.5 mg/dL (ref 0.3–1.2)
BUN: 17 mg/dL (ref 6–23)
CALCIUM: 8.9 mg/dL (ref 8.4–10.5)
CHLORIDE: 107 meq/L (ref 96–112)
CO2: 25 mmol/L (ref 19–32)
CREATININE: 0.67 mg/dL (ref 0.50–1.10)
GFR calc Af Amer: >90 mL/min (ref >90)
GFR, EST NON AFRICAN AMERICAN: 83 mL/min — AB (ref >90)
GLUCOSE: 132 mg/dL — AB (ref 70–99)
Potassium: 3.9 mmol/L (ref 3.5–5.1)
SODIUM: 138 mmol/L (ref 135–145)
TOTAL PROTEIN: 6.4 g/dL (ref 6.0–8.3)

## 2014-01-13 LAB — CBC WITH DIFFERENTIAL/PLATELET
BASOS PCT: 0 % (ref 0–1)
Basophils Absolute: 0 10*3/uL (ref 0.0–0.1)
Eosinophils Absolute: 0 10*3/uL (ref 0.0–0.7)
Eosinophils Relative: 0 % (ref 0–5)
HEMATOCRIT: 36.4 % (ref 36.0–46.0)
Hemoglobin: 12.3 g/dL (ref 12.0–15.0)
LYMPHS ABS: 1.1 10*3/uL (ref 0.7–4.0)
LYMPHS PCT: 6 % — AB (ref 12–46)
MCH: 30.1 pg (ref 26.0–34.0)
MCHC: 33.8 g/dL (ref 30.0–36.0)
MCV: 89 fL (ref 78.0–100.0)
MONO ABS: 1 10*3/uL (ref 0.1–1.0)
MONOS PCT: 5 % (ref 3–12)
NEUTROS PCT: 89 % — AB (ref 43–77)
Neutro Abs: 17.2 10*3/uL — ABNORMAL HIGH (ref 1.7–7.7)
Platelets: 380 10*3/uL (ref 150–400)
RBC: 4.09 MIL/uL (ref 3.87–5.11)
RDW: 13.9 % (ref 11.5–15.5)
WBC: 19.3 10*3/uL — AB (ref 4.0–10.5)

## 2014-01-13 MED ORDER — HEPARIN SOD (PORK) LOCK FLUSH 100 UNIT/ML IV SOLN
500.0000 [IU] | Freq: Once | INTRAVENOUS | Status: AC | PRN
  Administered 2014-01-13: 500 [IU]
  Filled 2014-01-13: qty 5

## 2014-01-13 MED ORDER — SODIUM CHLORIDE 0.9 % IV SOLN
Freq: Once | INTRAVENOUS | Status: AC
  Administered 2014-01-13: 10:00:00 via INTRAVENOUS
  Filled 2014-01-13: qty 5

## 2014-01-13 MED ORDER — SODIUM CHLORIDE 0.9 % IV SOLN
600.0000 mg/m2 | Freq: Once | INTRAVENOUS | Status: AC
  Administered 2014-01-13: 1080 mg via INTRAVENOUS
  Filled 2014-01-13: qty 54

## 2014-01-13 MED ORDER — SODIUM CHLORIDE 0.9 % IJ SOLN
10.0000 mL | INTRAMUSCULAR | Status: DC | PRN
  Administered 2014-01-13: 10 mL
  Filled 2014-01-13: qty 10

## 2014-01-13 MED ORDER — SODIUM CHLORIDE 0.9 % IV SOLN
Freq: Once | INTRAVENOUS | Status: AC
  Administered 2014-01-13: 10:00:00 via INTRAVENOUS

## 2014-01-13 MED ORDER — DOCETAXEL CHEMO INJECTION 160 MG/16ML
75.0000 mg/m2 | Freq: Once | INTRAVENOUS | Status: AC
  Administered 2014-01-13: 140 mg via INTRAVENOUS
  Filled 2014-01-13: qty 14

## 2014-01-13 MED ORDER — PALONOSETRON HCL INJECTION 0.25 MG/5ML
0.2500 mg | Freq: Once | INTRAVENOUS | Status: AC
  Administered 2014-01-13: 0.25 mg via INTRAVENOUS

## 2014-01-13 MED ORDER — PALONOSETRON HCL INJECTION 0.25 MG/5ML
INTRAVENOUS | Status: AC
  Filled 2014-01-13: qty 5

## 2014-01-13 NOTE — Progress Notes (Signed)
[name], MD [address] [place] [address]  Stage I triple negative carcinoma of the left breast s/p mastectomy and axillary LN disssection.  CURRENT THERAPY: TC started on 12/20/2013  INTERVAL HISTORY: Astria Jordahl 80 y.o. female returns for follow-up of her breast cancer and second cycle of TC. Her feet are better, but she occasionally has a "strange" feeling in her toes in the am. When she walks she cannot tell. She is up 3 lbs but states her taste is still off. She is eating small meals and frequently. She feels weak at times. She is otherwise ok and does all of her ADL's.  MEDICAL HISTORY: Past Medical History  Diagnosis Date  . High cholesterol   . Hypertension   . Anxiety   . Cancer     breast- right  . Arthritis   . Breast cancer     has Low back pain and Breast cancer on her problem list.      Breast cancer   07/25/2013 Imaging Plain film of the lumbar spine, complete 4 view. Chronic lumbar degenerative change most prominent at L4-5. No acute abnormality and no change from prior study   10/21/2013 Initial Diagnosis Left breast cancer, core biopsy, ER negative, PR negative, HER-2/neu negative, Ki-67 of 13%, infiltrating duct cell   11/04/2013 Surgery Left modified radical mastectomy, 1.8 cm infiltrating duct cell carcinoma, ER negative, PR negative, HER-2/neu not overexpressed, 9 lymph nodes negative. Stage TI cN0 M0.   12/06/2013 Imaging Portable chest x-ray showing no pneumothorax no lung edema or consolidation Port-A-Cath tip in the SVC.   12/23/2013 -  Chemotherapy Cycle 1 of TC initiated on 12/23/2013     has No Known Allergies.  Ms. Bahner does not currently have medications on file.  SURGICAL HISTORY: Past Surgical History  Procedure Laterality Date  . Abdominal hysterectomy    . Mastectomy Right   . Cataract extraction w/phaco Left 03/11/2013    Procedure: CATARACT EXTRACTION PHACO AND INTRAOCULAR LENS PLACEMENT (IOC);   Surgeon: Tonny Branch, MD;  Location: [facility];  Service: Ophthalmology;  Laterality: Left;  CDE:7.54  . Cataract extraction w/phaco Right 04/15/2013    Procedure: CATARACT EXTRACTION PHACO AND INTRAOCULAR LENS PLACEMENT (IOC);  Surgeon: Tonny Branch, MD;  Location: [facility];  Service: Ophthalmology;  Laterality: Right;  CDE 9.38  . Mastectomy modified radical Left 11/04/2013    Procedure: LEFT MASTECTOMY MODIFIED RADICAL;  Surgeon: Jamesetta So, MD;  Location: [facility];  Service: General;  Laterality: Left;  . Portacath placement Right 12/06/2013    Procedure: INSERTION PORT-A-CATH-Right subclavian;  Surgeon: Jamesetta So, MD;  Location: [facility];  Service: General;  Laterality: Right;    SOCIAL HISTORY: History   Social History  . Marital Status: Widowed    Spouse Name: N/A    Number of Children: N/A  . Years of Education: N/A   Occupational History  . Not on file.   Social History Main Topics  . Smoking status: Former Smoker -- 0.25 packs/day for 30 years    Types: Cigarettes    Start date: 10/10/1956    Quit date: 03/08/1985  . Smokeless tobacco: Never Used     Comment: quit 25 years ago  . Alcohol Use: No  . Drug Use: No  . Sexual Activity: Yes    Birth Control/ Protection: Post-menopausal   Other Topics Concern  . Not on file   Social History Narrative    FAMILY HISTORY: Family History  Problem  Relation Age of Onset  . Cancer Mother     died at 37, had anal cancer but died from complications of diabetes  . Diabetes Mother   . Dementia Father     Died at 63 in an accident  . Pancreatic cancer Brother     age 80  . Lung cancer Sister     age 85  . Ovarian cancer Sister     age 81    Review of Systems  Constitutional: Positive for weight loss and malaise/fatigue. Negative for fever, chills and diaphoresis.  HENT: Negative.   Eyes: Negative.   Respiratory: Negative.   Cardiovascular: Negative.   Gastrointestinal: Negative for heartburn, nausea, vomiting, abdominal  pain and blood in stool.       Had one episode of diarrhea, but had taken colace and 2 Tablespoons MOM prior  Genitourinary: Negative.   Musculoskeletal: Positive for myalgias, back pain and joint pain.  Skin: Negative.   Neurological: Positive for weakness.  Endo/Heme/Allergies: Negative.   Psychiatric/Behavioral: Negative.     PHYSICAL EXAMINATION  ECOG PERFORMANCE STATUS: 1 - Symptomatic but completely ambulatory  Filed Vitals:   01/13/14 0835  BP: 154/118  Pulse: 79  Temp: 97.9 F (36.6 C)  Resp: 18    Physical Exam  Constitutional: She is oriented to person, place, and time and well-developed, well-nourished, and in no distress.  HENT:  Head: Normocephalic and atraumatic.  Nose: Nose normal.  Mouth/Throat: Oropharynx is clear and moist. No oropharyngeal exudate.  Eyes: Conjunctivae and EOM are normal. Pupils are equal, round, and reactive to light. Right eye exhibits no discharge. Left eye exhibits no discharge. No scleral icterus.  Neck: Normal range of motion. Neck supple. No tracheal deviation present. No thyromegaly present.  Cardiovascular: Normal rate, regular rhythm and normal heart sounds.  Exam reveals no gallop and no friction rub.   No murmur heard. Pulmonary/Chest: Effort normal and breath sounds normal. She has no wheezes. She has no rales.  Abdominal: Soft. Bowel sounds are normal. She exhibits no distension and no mass. There is no tenderness. There is no rebound and no guarding.  Musculoskeletal: Normal range of motion. She exhibits no edema.  Lymphadenopathy:    She has no cervical adenopathy.  Neurological: She is alert and oriented to person, place, and time. She has normal reflexes. No cranial nerve deficit. Gait normal. Coordination normal.  Skin: Skin is warm and dry. No rash noted.  Psychiatric: Mood, memory, affect and judgment normal.  Nursing note and vitals reviewed.   LABORATORY DATA:  CBC    Component Value Date/Time   WBC 19.3*  01/13/2014 0845   WBC 6.7 10/10/2013 1010   WBC 7.9 07/06/2006 1141   RBC 4.09 01/13/2014 0845   RBC 4.70 10/10/2013 1010   RBC 4.45 07/06/2006 1141   HGB 12.3 01/13/2014 0845   HGB 14.0 10/10/2013 1010   HGB 13.0 07/06/2006 1141   HCT 36.4 01/13/2014 0845   HCT 41.8 10/10/2013 1010   HCT 38.2 07/06/2006 1141   PLT 380 01/13/2014 0845   PLT 220 10/10/2013 1010   PLT 272 07/06/2006 1141   MCV 89.0 01/13/2014 0845   MCV 89 10/10/2013 1010   MCV 85.8 07/06/2006 1141   MCH 30.1 01/13/2014 0845   MCH 29.8 10/10/2013 1010   MCH 29.3 07/06/2006 1141   MCHC 33.8 01/13/2014 0845   MCHC 33.5 10/10/2013 1010   MCHC 34.1 07/06/2006 1141   RDW 13.9 01/13/2014 0845   RDW 13.3 10/10/2013  1010   RDW 11.2* 07/06/2006 1141   LYMPHSABS 1.1 01/13/2014 0845   LYMPHSABS 2.0 10/10/2013 1010   LYMPHSABS 2.1 07/06/2006 1141   MONOABS 1.0 01/13/2014 0845   MONOABS 0.6 07/06/2006 1141   EOSABS 0.0 01/13/2014 0845   EOSABS 0.1 10/10/2013 1010   EOSABS 0.1 07/06/2006 1141   BASOSABS 0.0 01/13/2014 0845   BASOSABS 0.0 10/10/2013 1010   BASOSABS 0.1 07/06/2006 1141   CMP     Component Value Date/Time   NA 139 12/30/2013 1142   K 3.7 12/30/2013 1142   CL 103 12/30/2013 1142   CO2 30 12/30/2013 1142   GLUCOSE 153* 12/30/2013 1142   BUN 13 12/30/2013 1142   CREATININE 0.85 12/30/2013 1142   CALCIUM 9.0 12/30/2013 1142   PROT 6.5 12/30/2013 1142   ALBUMIN 3.4* 12/30/2013 1142   AST 30 12/30/2013 1142   ALT 27 12/30/2013 1142   ALKPHOS 80 12/30/2013 1142   BILITOT 0.6 12/30/2013 1142   GFRNONAA 64* 12/30/2013 1142   GFRAA 75* 12/30/2013 1142       ASSESSMENT and THERAPY PLAN:    Breast cancer 80 year old with Stage IA triple negative carcinoma of the breast. She is here today for cycle #2 of TC. She is doing reasonably well. We spent time discussing symptoms of concern, neuropathy, redness/soreness of skin of hands, feet, nausea, vomiting . She is well versed in treatment related side  effects and what to do. I have continued to encourage small frequent meals. She will add Boost Plus.  She will call in the interim with problems or concerns prior to her next follow-up.    All questions were answered. The patient knows to call the clinic with any problems, questions or concerns. We can certainly see the patient much sooner if necessary.   Molli Hazard 01/13/2014

## 2014-01-13 NOTE — Patient Instructions (Signed)
[facility] Discharge Instructions for Patients Receiving Chemotherapy  Today you received the following chemotherapy agents taxotere, cytoxan Please call the clinic if you have any questions or conerns  To help prevent nausea and vomiting after your treatment, we encourage you to take your nausea medication   If you develop nausea and vomiting that is not controlled by your nausea medication, call the clinic. If it is after clinic hours your family physician or the after hours number for the clinic or go to the Emergency Department.   BELOW ARE SYMPTOMS THAT SHOULD BE REPORTED IMMEDIATELY:  *FEVER GREATER THAN 101.0 F  *CHILLS WITH OR WITHOUT FEVER  NAUSEA AND VOMITING THAT IS NOT CONTROLLED WITH YOUR NAUSEA MEDICATION  *UNUSUAL SHORTNESS OF BREATH  *UNUSUAL BRUISING OR BLEEDING  TENDERNESS IN MOUTH AND THROAT WITH OR WITHOUT PRESENCE OF ULCERS  *URINARY PROBLEMS  *BOWEL PROBLEMS  UNUSUAL RASH Items with * indicate a potential emergency and should be followed up as soon as possible.  One of the nurses will contact you 24 hours after your treatment. Please let the nurse know about any problems that you may have experienced. Feel free to call the clinic you have any questions or concerns. The clinic phone number is [phone].   I have been informed and understand all the instructions given to me. I know to contact the clinic, my physician, or go to the Emergency Department if any problems should occur. I do not have any questions at this time, but understand that I may call the clinic during office hours or the Patient Navigator at [phone] should I have any questions or need assistance in obtaining follow up care.   Docetaxel injection What is this medicine? DOCETAXEL (doe se TAX el) is a chemotherapy drug. It targets fast dividing cells, like cancer cells, and causes these cells to die. This medicine is used to treat many types of cancers like breast  cancer, certain stomach cancers, head and neck cancer, lung cancer, and prostate cancer. This medicine may be used for other purposes; ask your health care provider or pharmacist if you have questions. COMMON BRAND NAME(S): Docefrez, Taxotere What should I tell my health care provider before I take this medicine? They need to know if you have any of these conditions: -infection (especially a virus infection such as chickenpox, cold sores, or herpes) -liver disease -low blood counts, like low white cell, platelet, or red cell counts -an unusual or allergic reaction to docetaxel, polysorbate 80, other chemotherapy agents, other medicines, foods, dyes, or preservatives -pregnant or trying to get pregnant -breast-feeding How should I use this medicine? This drug is given as an infusion into a vein. It is administered in a hospital or clinic by a specially trained health care professional. Talk to your pediatrician regarding the use of this medicine in children. Special care may be needed. Overdosage: If you think you have taken too much of this medicine contact a poison control center or emergency room at once. NOTE: This medicine is only for you. Do not share this medicine with others. What if I miss a dose? It is important not to miss your dose. Call your doctor or health care professional if you are unable to keep an appointment. What may interact with this medicine? -cyclosporine -erythromycin -ketoconazole -medicines to increase blood counts like filgrastim, pegfilgrastim, sargramostim -vaccines Talk to your doctor or health care professional before taking any of these medicines: -acetaminophen -aspirin -ibuprofen -ketoprofen -naproxen This list may  not describe all possible interactions. Give your health care provider a list of all the medicines, herbs, non-prescription drugs, or dietary supplements you use. Also tell them if you smoke, drink alcohol, or use illegal drugs. Some items  may interact with your medicine. What should I watch for while using this medicine? Your condition will be monitored carefully while you are receiving this medicine. You will need important blood work done while you are taking this medicine. This drug may make you feel generally unwell. This is not uncommon, as chemotherapy can affect healthy cells as well as cancer cells. Report any side effects. Continue your course of treatment even though you feel ill unless your doctor tells you to stop. In some cases, you may be given additional medicines to help with side effects. Follow all directions for their use. Call your doctor or health care professional for advice if you get a fever, chills or sore throat, or other symptoms of a cold or flu. Do not treat yourself. This drug decreases your body's ability to fight infections. Try to avoid being around people who are sick. This medicine may increase your risk to bruise or bleed. Call your doctor or health care professional if you notice any unusual bleeding. Be careful brushing and flossing your teeth or using a toothpick because you may get an infection or bleed more easily. If you have any dental work done, tell your dentist you are receiving this medicine. Avoid taking products that contain aspirin, acetaminophen, ibuprofen, naproxen, or ketoprofen unless instructed by your doctor. These medicines may hide a fever. This medicine contains an alcohol in the product. You may get drowsy or dizzy. Do not drive, use machinery, or do anything that needs mental alertness until you know how this medicine affects you. Do not stand or sit up quickly, especially if you are an older patient. This reduces the risk of dizzy or fainting spells. Avoid alcoholic drinks Do not become pregnant while taking this medicine. Women should inform their doctor if they wish to become pregnant or think they might be pregnant. There is a potential for serious side effects to an unborn  child. Talk to your health care professional or pharmacist for more information. Do not breast-feed an infant while taking this medicine. What side effects may I notice from receiving this medicine? Side effects that you should report to your doctor or health care professional as soon as possible: -allergic reactions like skin rash, itching or hives, swelling of the face, lips, or tongue -low blood counts - This drug may decrease the number of white blood cells, red blood cells and platelets. You may be at increased risk for infections and bleeding. -signs of infection - fever or chills, cough, sore throat, pain or difficulty passing urine -signs of decreased platelets or bleeding - bruising, pinpoint red spots on the skin, black, tarry stools, nosebleeds -signs of decreased red blood cells - unusually weak or tired, fainting spells, lightheadedness -breathing problems -fast or irregular heartbeat -low blood pressure -mouth sores -nausea and vomiting -pain, swelling, redness or irritation at the injection site -pain, tingling, numbness in the hands or feet -swelling of the ankle, feet, hands -weight gain Side effects that usually do not require medical attention (report to your prescriber or health care professional if they continue or are bothersome): -bone pain -complete hair loss including hair on your head, underarms, pubic hair, eyebrows, and eyelashes -diarrhea -excessive tearing -changes in the color of fingernails -loosening of the fingernails -nausea -muscle  pain -red flush to skin -sweating -weak or tired This list may not describe all possible side effects. Call your doctor for medical advice about side effects. You may report side effects to FDA at 1-800-FDA-1088. Where should I keep my medicine? This drug is given in a hospital or clinic and will not be stored at home. NOTE: This sheet is a summary. It may not cover all possible information. If you have questions about  this medicine, talk to your doctor, pharmacist, or health care provider.  2015, Elsevier/Gold Standard. (2012-11-15 22:21:02)  Cyclophosphamide injection What is this medicine? CYCLOPHOSPHAMIDE (sye kloe FOSS fa mide) is a chemotherapy drug. It slows the growth of cancer cells. This medicine is used to treat many types of cancer like lymphoma, myeloma, leukemia, breast cancer, and ovarian cancer, to name a few. This medicine may be used for other purposes; ask your health care provider or pharmacist if you have questions. COMMON BRAND NAME(S): Cytoxan, Neosar What should I tell my health care provider before I take this medicine? They need to know if you have any of these conditions: -blood disorders -history of other chemotherapy -infection -kidney disease -liver disease -recent or ongoing radiation therapy -tumors in the bone marrow -an unusual or allergic reaction to cyclophosphamide, other chemotherapy, other medicines, foods, dyes, or preservatives -pregnant or trying to get pregnant -breast-feeding How should I use this medicine? This drug is usually given as an injection into a vein or muscle or by infusion into a vein. It is administered in a hospital or clinic by a specially trained health care professional. Talk to your pediatrician regarding the use of this medicine in children. Special care may be needed. Overdosage: If you think you have taken too much of this medicine contact a poison control center or emergency room at once. NOTE: This medicine is only for you. Do not share this medicine with others. What if I miss a dose? It is important not to miss your dose. Call your doctor or health care professional if you are unable to keep an appointment. What may interact with this medicine? This medicine may interact with the following medications: -amiodarone -amphotericin B -azathioprine -certain antiviral medicines for HIV or AIDS such as protease inhibitors (e.g.,  indinavir, ritonavir) and zidovudine -certain blood pressure medications such as benazepril, captopril, enalapril, fosinopril, lisinopril, moexipril, monopril, perindopril, quinapril, ramipril, trandolapril -certain cancer medications such as anthracyclines (e.g., daunorubicin, doxorubicin), busulfan, cytarabine, paclitaxel, pentostatin, tamoxifen, trastuzumab -certain diuretics such as chlorothiazide, chlorthalidone, hydrochlorothiazide, indapamide, metolazone -certain medicines that treat or prevent blood clots like warfarin -certain muscle relaxants such as succinylcholine -cyclosporine -etanercept -indomethacin -medicines to increase blood counts like filgrastim, pegfilgrastim, sargramostim -medicines used as general anesthesia -metronidazole -natalizumab This list may not describe all possible interactions. Give your health care provider a list of all the medicines, herbs, non-prescription drugs, or dietary supplements you use. Also tell them if you smoke, drink alcohol, or use illegal drugs. Some items may interact with your medicine. What should I watch for while using this medicine? Visit your doctor for checks on your progress. This drug may make you feel generally unwell. This is not uncommon, as chemotherapy can affect healthy cells as well as cancer cells. Report any side effects. Continue your course of treatment even though you feel ill unless your doctor tells you to stop. Drink water or other fluids as directed. Urinate often, even at night. In some cases, you may be given additional medicines to help with side effects. Follow all  directions for their use. Call your doctor or health care professional for advice if you get a fever, chills or sore throat, or other symptoms of a cold or flu. Do not treat yourself. This drug decreases your body's ability to fight infections. Try to avoid being around people who are sick. This medicine may increase your risk to bruise or bleed. Call  your doctor or health care professional if you notice any unusual bleeding. Be careful brushing and flossing your teeth or using a toothpick because you may get an infection or bleed more easily. If you have any dental work done, tell your dentist you are receiving this medicine. You may get drowsy or dizzy. Do not drive, use machinery, or do anything that needs mental alertness until you know how this medicine affects you. Do not become pregnant while taking this medicine or for 1 year after stopping it. Women should inform their doctor if they wish to become pregnant or think they might be pregnant. Men should not father a child while taking this medicine and for 4 months after stopping it. There is a potential for serious side effects to an unborn child. Talk to your health care professional or pharmacist for more information. Do not breast-feed an infant while taking this medicine. This medicine may interfere with the ability to have a child. This medicine has caused ovarian failure in some women. This medicine has caused reduced sperm counts in some men. You should talk with your doctor or health care professional if you are concerned about your fertility. If you are going to have surgery, tell your doctor or health care professional that you have taken this medicine. What side effects may I notice from receiving this medicine? Side effects that you should report to your doctor or health care professional as soon as possible: -allergic reactions like skin rash, itching or hives, swelling of the face, lips, or tongue -low blood counts - this medicine may decrease the number of white blood cells, red blood cells and platelets. You may be at increased risk for infections and bleeding. -signs of infection - fever or chills, cough, sore throat, pain or difficulty passing urine -signs of decreased platelets or bleeding - bruising, pinpoint red spots on the skin, black, tarry stools, blood in the  urine -signs of decreased red blood cells - unusually weak or tired, fainting spells, lightheadedness -breathing problems -dark urine -dizziness -palpitations -swelling of the ankles, feet, hands -trouble passing urine or change in the amount of urine -weight gain -yellowing of the eyes or skin Side effects that usually do not require medical attention (report to your doctor or health care professional if they continue or are bothersome): -changes in nail or skin color -hair loss -missed menstrual periods -mouth sores -nausea, vomiting This list may not describe all possible side effects. Call your doctor for medical advice about side effects. You may report side effects to FDA at 1-800-FDA-1088. Where should I keep my medicine? This drug is given in a hospital or clinic and will not be stored at home. NOTE: This sheet is a summary. It may not cover all possible information. If you have questions about this medicine, talk to your doctor, pharmacist, or health care provider.  2015, Elsevier/Gold Standard. (2011-11-04 16:22:58)

## 2014-01-13 NOTE — Progress Notes (Signed)
Tiffany Sweeney Tolerated chemotherapy well today

## 2014-01-13 NOTE — Assessment & Plan Note (Signed)
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 She is here today for cycle #2 of TC. She is doing reasonably well. We spent time discussing symptoms of concern, neuropathy, redness/soreness of skin of hands, feet, nausea, vomiting . She is well versed in treatment related side effects and what to do. I have continued to encourage small frequent meals. She will add Boost Plus.  She will call in the interim with problems or concerns prior to her next follow-up.

## 2014-01-13 NOTE — Patient Instructions (Signed)
[facility] Discharge Instructions  RECOMMENDATIONS MADE BY THE CONSULTANT AND ANY TEST RESULTS WILL BE SENT TO YOUR REFERRING PHYSICIAN.  We reviewed symptoms to be concerned about, please call with any problems I will see you back in 3 weeks prior to your next treatment.  Thank you for choosing [facility] at [facility] to  provide your oncology and hematology care. To afford each patient quality  time with our provider, please arrive at least 15 minutes before your  scheduled appointment time.  You need to re-schedule your appointment should you arrive 10 or more  minutes late. We strive to give you quality time with our providers, and  arriving late affects you and other patients whose appointments are after  yours. Also, if you no show three or more times for appointments you may  be dismissed from the clinic at the providers discretion.  Again, thank you for choosing [facility]. Our hope is that  these requests will decrease the amount of time that you wait before being seen by our physicians. _______________________________________________________________  Should you have questions after your visit to [facility], please contact our office at [phone] between the hours of 8:30 a.m. and 5:00 p.m.  Voicemails left after 4:30 p.m. will not be returned until the following business day.  For prescription refill requests, have your pharmacy contact our office with your prescription refill request.    [facility] TO REPORT AS SOON AS POSSIBLE AFTER TREATMENT:  FEVER GREATER THAN 100.5 F  CHILLS WITH OR WITHOUT FEVER  NAUSEA AND VOMITING THAT IS NOT CONTROLLED WITH YOUR NAUSEA MEDICATION  UNUSUAL SHORTNESS OF BREATH  UNUSUAL BRUISING OR BLEEDING  TENDERNESS IN MOUTH AND THROAT WITH OR WITHOUT PRESENCE OF ULCERS  URINARY PROBLEMS  BOWEL PROBLEMS  UNUSUAL  RASH    Wear comfortable clothing and clothing appropriate for easy access to any Portacath or PICC line. Let us know if there is anything that we can do to make your therapy better!

## 2014-01-15 ENCOUNTER — Encounter (HOSPITAL_BASED_OUTPATIENT_CLINIC_OR_DEPARTMENT_OTHER): Payer: Medicare Other

## 2014-01-15 DIAGNOSIS — Z5189 Encounter for other specified aftercare: Secondary | ICD-10-CM

## 2014-01-15 DIAGNOSIS — C50912 Malignant neoplasm of unspecified site of left female breast: Secondary | ICD-10-CM

## 2014-01-15 DIAGNOSIS — C50412 Malignant neoplasm of upper-outer quadrant of left female breast: Secondary | ICD-10-CM

## 2014-01-15 MED ORDER — PEGFILGRASTIM INJECTION 6 MG/0.6ML ~~LOC~~
6.0000 mg | PREFILLED_SYRINGE | Freq: Once | SUBCUTANEOUS | Status: AC
  Administered 2014-01-15: 6 mg via SUBCUTANEOUS

## 2014-01-15 MED ORDER — PEGFILGRASTIM INJECTION 6 MG/0.6ML ~~LOC~~
PREFILLED_SYRINGE | SUBCUTANEOUS | Status: AC
  Filled 2014-01-15: qty 0.6

## 2014-01-15 NOTE — Progress Notes (Unsigned)
Tiffany Sweeney presents today for injection per MD orders. Neulasta 6mg  administered SQ in right Abdomen. Administration without incident. Patient tolerated well.

## 2014-01-22 ENCOUNTER — Telehealth (HOSPITAL_COMMUNITY): Payer: Self-pay

## 2014-01-22 NOTE — Telephone Encounter (Signed)
-----   Message from Epifanio Lesches sent at 01/22/2014  9:20 AM EST ----- Contact: [phone] Pts daughter(Nadine Money) would like to speak w/ Dr Whitney Muse but I thought I should filter the call thur you all first.

## 2014-01-22 NOTE — Telephone Encounter (Signed)
Tiffany Sweeney is willing to do the genetic testing and PCP want to know what he needs to order/where to make referral.  Tiffany Sweeney also wants to know if she gets tested will they also test for ovarian cancer as there is a family history of that as well.

## 2014-01-30 ENCOUNTER — Telehealth (HOSPITAL_COMMUNITY): Payer: Self-pay | Admitting: Emergency Medicine

## 2014-01-30 NOTE — Telephone Encounter (Signed)
-----   Message from Tiffany Sweeney sent at 01/30/2014 11:42 AM EST ----- Pt has a "tickle" in the back of her throat and wants to know what or if she should do about it.

## 2014-01-30 NOTE — Telephone Encounter (Signed)
Called and spoke with pt to tell her to gargle with salt water and to use throat lozenges.  Call if she had a fever

## 2014-02-03 ENCOUNTER — Encounter (HOSPITAL_COMMUNITY): Payer: Medicare Other | Attending: Hematology and Oncology

## 2014-02-03 ENCOUNTER — Encounter (HOSPITAL_COMMUNITY): Payer: Self-pay | Admitting: Hematology & Oncology

## 2014-02-03 ENCOUNTER — Encounter (HOSPITAL_BASED_OUTPATIENT_CLINIC_OR_DEPARTMENT_OTHER): Payer: Medicare Other | Admitting: Hematology & Oncology

## 2014-02-03 VITALS — BP 148/84 | HR 86 | Temp 97.9°F | Resp 18 | Wt 155.8 lb

## 2014-02-03 VITALS — BP 153/86 | HR 71 | Temp 98.0°F | Resp 18

## 2014-02-03 DIAGNOSIS — Z5111 Encounter for antineoplastic chemotherapy: Secondary | ICD-10-CM

## 2014-02-03 DIAGNOSIS — M810 Age-related osteoporosis without current pathological fracture: Secondary | ICD-10-CM | POA: Diagnosis not present

## 2014-02-03 DIAGNOSIS — C50912 Malignant neoplasm of unspecified site of left female breast: Secondary | ICD-10-CM

## 2014-02-03 DIAGNOSIS — Z170 Estrogen receptor positive status [ER+]: Secondary | ICD-10-CM | POA: Insufficient documentation

## 2014-02-03 DIAGNOSIS — C50911 Malignant neoplasm of unspecified site of right female breast: Secondary | ICD-10-CM | POA: Insufficient documentation

## 2014-02-03 DIAGNOSIS — I10 Essential (primary) hypertension: Secondary | ICD-10-CM | POA: Diagnosis not present

## 2014-02-03 DIAGNOSIS — Z171 Estrogen receptor negative status [ER-]: Secondary | ICD-10-CM

## 2014-02-03 DIAGNOSIS — C773 Secondary and unspecified malignant neoplasm of axilla and upper limb lymph nodes: Secondary | ICD-10-CM

## 2014-02-03 DIAGNOSIS — C50919 Malignant neoplasm of unspecified site of unspecified female breast: Secondary | ICD-10-CM

## 2014-02-03 DIAGNOSIS — G893 Neoplasm related pain (acute) (chronic): Secondary | ICD-10-CM

## 2014-02-03 LAB — COMPREHENSIVE METABOLIC PANEL
ALBUMIN: 3.4 g/dL — AB (ref 3.5–5.2)
ALT: 18 U/L (ref 0–35)
AST: 23 U/L (ref 0–37)
Alkaline Phosphatase: 70 U/L (ref 39–117)
Anion gap: 6 (ref 5–15)
BUN: 15 mg/dL (ref 6–23)
CHLORIDE: 107 mmol/L (ref 96–112)
CO2: 24 mmol/L (ref 19–32)
Calcium: 8.4 mg/dL (ref 8.4–10.5)
Creatinine, Ser: 0.66 mg/dL (ref 0.50–1.10)
GFR calc Af Amer: >90 mL/min (ref >90)
GFR calc non Af Amer: 83 mL/min — ABNORMAL LOW (ref >90)
GLUCOSE: 222 mg/dL — AB (ref 70–99)
Potassium: 3.6 mmol/L (ref 3.5–5.1)
SODIUM: 137 mmol/L (ref 135–145)
Total Bilirubin: 0.6 mg/dL (ref 0.3–1.2)
Total Protein: 6.4 g/dL (ref 6.0–8.3)

## 2014-02-03 LAB — CBC WITH DIFFERENTIAL/PLATELET
BASOS PCT: 0 % (ref 0–1)
Basophils Absolute: 0 10*3/uL (ref 0.0–0.1)
Eosinophils Absolute: 0 10*3/uL (ref 0.0–0.7)
Eosinophils Relative: 0 % (ref 0–5)
HCT: 34.6 % — ABNORMAL LOW (ref 36.0–46.0)
HEMOGLOBIN: 11.5 g/dL — AB (ref 12.0–15.0)
Lymphocytes Relative: 6 % — ABNORMAL LOW (ref 12–46)
Lymphs Abs: 0.9 10*3/uL (ref 0.7–4.0)
MCH: 29.6 pg (ref 26.0–34.0)
MCHC: 33.2 g/dL (ref 30.0–36.0)
MCV: 89.2 fL (ref 78.0–100.0)
MONOS PCT: 3 % (ref 3–12)
Monocytes Absolute: 0.5 10*3/uL (ref 0.1–1.0)
Neutro Abs: 13.1 10*3/uL — ABNORMAL HIGH (ref 1.7–7.7)
Neutrophils Relative %: 91 % — ABNORMAL HIGH (ref 43–77)
PLATELETS: 257 10*3/uL (ref 150–400)
RBC: 3.88 MIL/uL (ref 3.87–5.11)
RDW: 15.6 % — ABNORMAL HIGH (ref 11.5–15.5)
WBC: 14.5 10*3/uL — ABNORMAL HIGH (ref 4.0–10.5)

## 2014-02-03 MED ORDER — SODIUM CHLORIDE 0.9 % IV SOLN
Freq: Once | INTRAVENOUS | Status: AC
  Administered 2014-02-03: 09:00:00 via INTRAVENOUS

## 2014-02-03 MED ORDER — TRIAMCINOLONE 0.1 % CREAM:EUCERIN CREAM 1:1: 1.0000 "application " | TOPICAL_CREAM | Freq: Three times a day (TID) | CUTANEOUS | Status: DC

## 2014-02-03 MED ORDER — DOCETAXEL CHEMO INJECTION 160 MG/16ML
75.0000 mg/m2 | Freq: Once | INTRAVENOUS | Status: AC
  Administered 2014-02-03: 140 mg via INTRAVENOUS
  Filled 2014-02-03: qty 14

## 2014-02-03 MED ORDER — SODIUM CHLORIDE 0.9 % IV SOLN
600.0000 mg/m2 | Freq: Once | INTRAVENOUS | Status: AC
  Administered 2014-02-03: 1080 mg via INTRAVENOUS
  Filled 2014-02-03: qty 54

## 2014-02-03 MED ORDER — HEPARIN SOD (PORK) LOCK FLUSH 100 UNIT/ML IV SOLN
500.0000 [IU] | Freq: Once | INTRAVENOUS | Status: AC | PRN
  Administered 2014-02-03: 500 [IU]

## 2014-02-03 MED ORDER — FOSAPREPITANT DIMEGLUMINE INJECTION 150 MG
Freq: Once | INTRAVENOUS | Status: AC
  Administered 2014-02-03: 10:00:00 via INTRAVENOUS
  Filled 2014-02-03: qty 5

## 2014-02-03 MED ORDER — SODIUM CHLORIDE 0.9 % IJ SOLN: 10.0000 mL | INTRAMUSCULAR | Status: DC | PRN

## 2014-02-03 MED ORDER — HEPARIN SOD (PORK) LOCK FLUSH 100 UNIT/ML IV SOLN
INTRAVENOUS | Status: AC
  Filled 2014-02-03: qty 5

## 2014-02-03 NOTE — Patient Instructions (Signed)
[facility] Discharge Instructions  RECOMMENDATIONS MADE BY THE CONSULTANT AND ANY TEST RESULTS WILL BE SENT TO YOUR REFERRING PHYSICIAN.  SPECIAL INSTRUCTIONS/FOLLOW-UP:  Please call prior to your next visit with any problems or concerns. We will see you back again in 3 weeks with labs and an office visit. We have printed the following prescriptions:  Triamcinolone cream   Thank you for choosing [facility] to provide your oncology and hematology care.  To afford each patient quality time with our providers, please arrive at least 15 minutes before your scheduled appointment time.  With your help, our goal is to use those 15 minutes to complete the necessary work-up to ensure our physicians have the information they need to help with your evaluation and healthcare recommendations.    Effective January 1st, 2014, we ask that you re-schedule your appointment with our physicians should you arrive 10 or more minutes late for your appointment.  We strive to give you quality time with our providers, and arriving late affects you and other patients whose appointments are after yours.    Again, thank you for choosing [facility].  Our hope is that these requests will decrease the amount of time that you wait before being seen by our physicians.       _____________________________________________________________  Should you have questions after your visit to [facility], please contact our office at [phone] between the hours of 8:30 a.m. and 5:00 p.m.  Voicemails left after 4:30 p.m. will not be returned until the following business day.  For prescription refill requests, have your pharmacy contact our office with your prescription refill request.

## 2014-02-03 NOTE — Progress Notes (Signed)
Tiffany Bellow, MD [address] [place] [address]  Stage I triple negative carcinoma of the left breast s/p mastectomy and axillary LN disssection.  CURRENT THERAPY: TC started on 12/20/2013  INTERVAL HISTORY: Tiffany Sweeney 80 y.o. female returns for follow-up of her breast cancer and third cycle of TC. She has thought more about genetics counseling and would like to proceed for her daughter and grand-daughter.  She did better this time after her neulasta, she did try claritin. Her weight is up some and she is eating better.  Her fingers are ok. She has been using udder cream. She is developing a blotchy rash on her arms.  Toes are sensitive and more so in the am.  They are not worse.  No problems walking.   Her sister says she is up cooking and doing all her ADL's. Tiffany Sweeney states she feels very tired all the time.   MEDICAL HISTORY: Past Medical History  Diagnosis Date  . High cholesterol   . Hypertension   . Anxiety   . Cancer     breast- right  . Arthritis   . Breast cancer     has Low back pain and Breast cancer on her problem list.      Breast cancer   07/25/2013 Imaging Plain film of the lumbar spine, complete 4 view. Chronic lumbar degenerative change most prominent at L4-5. No acute abnormality and no change from prior study   10/21/2013 Initial Diagnosis Left breast cancer, core biopsy, ER negative, PR negative, HER-2/neu negative, Ki-67 of 13%, infiltrating duct cell   11/04/2013 Surgery Left modified radical mastectomy, 1.8 cm infiltrating duct cell carcinoma, ER negative, PR negative, HER-2/neu not overexpressed, 9 lymph nodes negative. Stage TI cN0 M0.   12/06/2013 Imaging Portable chest x-ray showing no pneumothorax no lung edema or consolidation Port-A-Cath tip in the SVC.   12/23/2013 -  Chemotherapy Cycle 1 of TC initiated on 12/23/2013     has No Known Allergies.  Tiffany Sweeney does not currently have medications on file.  SURGICAL  HISTORY: Past Surgical History  Procedure Laterality Date  . Abdominal hysterectomy    . Mastectomy Right   . Cataract extraction w/phaco Left 03/11/2013    Procedure: CATARACT EXTRACTION PHACO AND INTRAOCULAR LENS PLACEMENT (IOC);  Surgeon: Tonny Branch, MD;  Location: [facility];  Service: Ophthalmology;  Laterality: Left;  CDE:7.54  . Cataract extraction w/phaco Right 04/15/2013    Procedure: CATARACT EXTRACTION PHACO AND INTRAOCULAR LENS PLACEMENT (IOC);  Surgeon: Tonny Branch, MD;  Location: [facility];  Service: Ophthalmology;  Laterality: Right;  CDE 9.38  . Mastectomy modified radical Left 11/04/2013    Procedure: LEFT MASTECTOMY MODIFIED RADICAL;  Surgeon: Jamesetta So, MD;  Location: [facility];  Service: General;  Laterality: Left;  . Portacath placement Right 12/06/2013    Procedure: INSERTION PORT-A-CATH-Right subclavian;  Surgeon: Jamesetta So, MD;  Location: [facility];  Service: General;  Laterality: Right;    SOCIAL HISTORY: History   Social History  . Marital Status: Widowed    Spouse Name: N/A    Number of Children: N/A  . Years of Education: N/A   Occupational History  . Not on file.   Social History Main Topics  . Smoking status: Former Smoker -- 0.25 packs/day for 30 years    Types: Cigarettes    Start date: 10/10/1956    Quit date: 03/08/1985  . Smokeless tobacco: Never Used     Comment: quit 25 years ago  .  Alcohol Use: No  . Drug Use: No  . Sexual Activity: Yes    Birth Control/ Protection: Post-menopausal   Other Topics Concern  . Not on file   Social History Narrative    FAMILY HISTORY: Family History  Problem Relation Age of Onset  . Cancer Mother     died at 72, had anal cancer but died from complications of diabetes  . Diabetes Mother   . Dementia Father     Died at 23 in an accident  . Pancreatic cancer Brother     age 78  . Lung cancer Sister     age 27  . Ovarian cancer Sister     age 11    Review of Systems  Constitutional: Positive for  malaise/fatigue. Negative for fever, chills and diaphoresis.  HENT: Negative.   Eyes: Negative.   Cardiovascular: Negative.   Gastrointestinal: Negative for heartburn, nausea, vomiting, abdominal pain, diarrhea, constipation, blood in stool and melena.  Genitourinary: Negative.   Musculoskeletal: Negative.   Skin: Positive for rash.  Neurological: Negative.  Negative for weakness.  Endo/Heme/Allergies: Negative.   Psychiatric/Behavioral: Negative.     PHYSICAL EXAMINATION  ECOG PERFORMANCE STATUS: 1 - Symptomatic but completely ambulatory  Filed Vitals:   02/03/14 0800  BP: 148/84  Pulse: 86  Temp: 97.9 F (36.6 C)  Resp: 18    Physical Exam  Constitutional: She is oriented to person, place, and time and well-developed, well-nourished, and in no distress.  HENT:  Head: Normocephalic and atraumatic.  Nose: Nose normal.  Mouth/Throat: Oropharynx is clear and moist. No oropharyngeal exudate.  Eyes: Conjunctivae and EOM are normal. Pupils are equal, round, and reactive to light. Right eye exhibits no discharge. Left eye exhibits no discharge. No scleral icterus.  Neck: Normal range of motion. Neck supple. No tracheal deviation present. No thyromegaly present.  Cardiovascular: Normal rate, regular rhythm and normal heart sounds.  Exam reveals no gallop and no friction rub.   No murmur heard. Pulmonary/Chest: Effort normal and breath sounds normal. She has no wheezes. She has no rales.  Abdominal: Soft. Bowel sounds are normal. She exhibits no distension and no mass. There is no tenderness. There is no rebound and no guarding.  Musculoskeletal: Normal range of motion. She exhibits no edema.  Lymphadenopathy:    She has no cervical adenopathy.  Neurological: She is alert and oriented to person, place, and time. She has normal reflexes. No cranial nerve deficit. Gait normal. Coordination normal.  Skin: Skin is warm and dry. No rash noted.  Psychiatric: Mood, memory, affect and  judgment normal.  Nursing note and vitals reviewed.   LABORATORY DATA:  CBC    Component Value Date/Time   WBC 14.5* 02/03/2014 0832   WBC 6.7 10/10/2013 1010   WBC 7.9 07/06/2006 1141   RBC 3.88 02/03/2014 0832   RBC 4.70 10/10/2013 1010   RBC 4.45 07/06/2006 1141   HGB 11.5* 02/03/2014 0832   HGB 14.0 10/10/2013 1010   HGB 13.0 07/06/2006 1141   HCT 34.6* 02/03/2014 0832   HCT 41.8 10/10/2013 1010   HCT 38.2 07/06/2006 1141   PLT 257 02/03/2014 0832   PLT 220 10/10/2013 1010   PLT 272 07/06/2006 1141   MCV 89.2 02/03/2014 0832   MCV 89 10/10/2013 1010   MCV 85.8 07/06/2006 1141   MCH 29.6 02/03/2014 0832   MCH 29.8 10/10/2013 1010   MCH 29.3 07/06/2006 1141   MCHC 33.2 02/03/2014 0832   MCHC 33.5 10/10/2013 1010  MCHC 34.1 07/06/2006 1141   RDW 15.6* 02/03/2014 0832   RDW 13.3 10/10/2013 1010   RDW 11.2* 07/06/2006 1141   LYMPHSABS 0.9 02/03/2014 0832   LYMPHSABS 2.0 10/10/2013 1010   LYMPHSABS 2.1 07/06/2006 1141   MONOABS 0.5 02/03/2014 0832   MONOABS 0.6 07/06/2006 1141   EOSABS 0.0 02/03/2014 0832   EOSABS 0.1 10/10/2013 1010   EOSABS 0.1 07/06/2006 1141   BASOSABS 0.0 02/03/2014 0832   BASOSABS 0.0 10/10/2013 1010   BASOSABS 0.1 07/06/2006 1141   CMP     Component Value Date/Time   NA 137 02/03/2014 0832   K 3.6 02/03/2014 0832   CL 107 02/03/2014 0832   CO2 24 02/03/2014 0832   GLUCOSE 222* 02/03/2014 0832   BUN 15 02/03/2014 0832   CREATININE 0.66 02/03/2014 0832   CALCIUM 8.4 02/03/2014 0832   PROT 6.4 02/03/2014 0832   ALBUMIN 3.4* 02/03/2014 0832   AST 23 02/03/2014 0832   ALT 18 02/03/2014 0832   ALKPHOS 70 02/03/2014 0832   BILITOT 0.6 02/03/2014 0832   GFRNONAA 83* 02/03/2014 0832   GFRAA >90 02/03/2014 2671       ASSESSMENT and THERAPY PLAN:    Breast cancer Pleasant 80 year old female currently receiving Taxotere and Cytoxan for left breast cancer, triple negative. She has done surprisingly well with therapy. She had some  difficulties with oral intake after her first cycle but this has improved. She also had some difficulty with Neulasta associated bone pain but this has also improved. Based on laboratory studies today she is cleared for her next cycle of chemotherapy. We discussed referring her for genetics counseling as she is finally interested in proceeding. If she has any other interim problems or concerns and advised her to call. She does have a very mild Taxotere rash to the hands, I do not feel a need to dose reduce her yet. I did prescribe her some triamcinolone and Eucerin cream. I advised her to call if the rash worsens after this treatment cycle.     All questions were answered. The patient knows to call the clinic with any problems, questions or concerns. We can certainly see the patient much sooner if necessary.   Molli Hazard 02/05/2014

## 2014-02-03 NOTE — Progress Notes (Signed)
Tiffany Sweeney Tolerated chemotherapy well today.  Discharged ambulatory

## 2014-02-03 NOTE — Patient Instructions (Signed)
[facility] Discharge Instructions for Patients Receiving Chemotherapy  Today you received the following chemotherapy agents taxotere and cytoxan Please call the clinic if you have any question or concerns.    To help prevent nausea and vomiting after your treatment, we encourage you to take your nausea medication    If you develop nausea and vomiting that is not controlled by your nausea medication, call the clinic. If it is after clinic hours your family physician or the after hours number for the clinic or go to the Emergency Department.   BELOW ARE SYMPTOMS THAT SHOULD BE REPORTED IMMEDIATELY:  *FEVER GREATER THAN 101.0 F  *CHILLS WITH OR WITHOUT FEVER  NAUSEA AND VOMITING THAT IS NOT CONTROLLED WITH YOUR NAUSEA MEDICATION  *UNUSUAL SHORTNESS OF BREATH  *UNUSUAL BRUISING OR BLEEDING  TENDERNESS IN MOUTH AND THROAT WITH OR WITHOUT PRESENCE OF ULCERS  *URINARY PROBLEMS  *BOWEL PROBLEMS  UNUSUAL RASH Items with * indicate a potential emergency and should be followed up as soon as possible.  One of the nurses will contact you 24 hours after your treatment. Please let the nurse know about any problems that you may have experienced. Feel free to call the clinic you have any questions or concerns. The clinic phone number is [phone].   I have been informed and understand all the instructions given to me. I know to contact the clinic, my physician, or go to the Emergency Department if any problems should occur. I do not have any questions at this time, but understand that I may call the clinic during office hours or the Patient Navigator at [phone] should I have any questions or need assistance in obtaining follow up care.    Docetaxel injection What is this medicine? DOCETAXEL (doe se TAX el) is a chemotherapy drug. It targets fast dividing cells, like cancer cells, and causes these cells to die. This medicine is used to treat many types of cancers like  breast cancer, certain stomach cancers, head and neck cancer, lung cancer, and prostate cancer. This medicine may be used for other purposes; ask your health care provider or pharmacist if you have questions. COMMON BRAND NAME(S): Docefrez, Taxotere What should I tell my health care provider before I take this medicine? They need to know if you have any of these conditions: -infection (especially a virus infection such as chickenpox, cold sores, or herpes) -liver disease -low blood counts, like low white cell, platelet, or red cell counts -an unusual or allergic reaction to docetaxel, polysorbate 80, other chemotherapy agents, other medicines, foods, dyes, or preservatives -pregnant or trying to get pregnant -breast-feeding How should I use this medicine? This drug is given as an infusion into a vein. It is administered in a hospital or clinic by a specially trained health care professional. Talk to your pediatrician regarding the use of this medicine in children. Special care may be needed. Overdosage: If you think you have taken too much of this medicine contact a poison control center or emergency room at once. NOTE: This medicine is only for you. Do not share this medicine with others. What if I miss a dose? It is important not to miss your dose. Call your doctor or health care professional if you are unable to keep an appointment. What may interact with this medicine? -cyclosporine -erythromycin -ketoconazole -medicines to increase blood counts like filgrastim, pegfilgrastim, sargramostim -vaccines Talk to your doctor or health care professional before taking any of these medicines: -acetaminophen -aspirin -ibuprofen -  ketoprofen -naproxen This list may not describe all possible interactions. Give your health care provider a list of all the medicines, herbs, non-prescription drugs, or dietary supplements you use. Also tell them if you smoke, drink alcohol, or use illegal drugs. Some  items may interact with your medicine. What should I watch for while using this medicine? Your condition will be monitored carefully while you are receiving this medicine. You will need important blood work done while you are taking this medicine. This drug may make you feel generally unwell. This is not uncommon, as chemotherapy can affect healthy cells as well as cancer cells. Report any side effects. Continue your course of treatment even though you feel ill unless your doctor tells you to stop. In some cases, you may be given additional medicines to help with side effects. Follow all directions for their use. Call your doctor or health care professional for advice if you get a fever, chills or sore throat, or other symptoms of a cold or flu. Do not treat yourself. This drug decreases your body's ability to fight infections. Try to avoid being around people who are sick. This medicine may increase your risk to bruise or bleed. Call your doctor or health care professional if you notice any unusual bleeding. Be careful brushing and flossing your teeth or using a toothpick because you may get an infection or bleed more easily. If you have any dental work done, tell your dentist you are receiving this medicine. Avoid taking products that contain aspirin, acetaminophen, ibuprofen, naproxen, or ketoprofen unless instructed by your doctor. These medicines may hide a fever. This medicine contains an alcohol in the product. You may get drowsy or dizzy. Do not drive, use machinery, or do anything that needs mental alertness until you know how this medicine affects you. Do not stand or sit up quickly, especially if you are an older patient. This reduces the risk of dizzy or fainting spells. Avoid alcoholic drinks Do not become pregnant while taking this medicine. Women should inform their doctor if they wish to become pregnant or think they might be pregnant. There is a potential for serious side effects to an  unborn child. Talk to your health care professional or pharmacist for more information. Do not breast-feed an infant while taking this medicine. What side effects may I notice from receiving this medicine? Side effects that you should report to your doctor or health care professional as soon as possible: -allergic reactions like skin rash, itching or hives, swelling of the face, lips, or tongue -low blood counts - This drug may decrease the number of white blood cells, red blood cells and platelets. You may be at increased risk for infections and bleeding. -signs of infection - fever or chills, cough, sore throat, pain or difficulty passing urine -signs of decreased platelets or bleeding - bruising, pinpoint red spots on the skin, black, tarry stools, nosebleeds -signs of decreased red blood cells - unusually weak or tired, fainting spells, lightheadedness -breathing problems -fast or irregular heartbeat -low blood pressure -mouth sores -nausea and vomiting -pain, swelling, redness or irritation at the injection site -pain, tingling, numbness in the hands or feet -swelling of the ankle, feet, hands -weight gain Side effects that usually do not require medical attention (report to your prescriber or health care professional if they continue or are bothersome): -bone pain -complete hair loss including hair on your head, underarms, pubic hair, eyebrows, and eyelashes -diarrhea -excessive tearing -changes in the color of fingernails -loosening  of the fingernails -nausea -muscle pain -red flush to skin -sweating -weak or tired This list may not describe all possible side effects. Call your doctor for medical advice about side effects. You may report side effects to FDA at 1-800-FDA-1088. Where should I keep my medicine? This drug is given in a hospital or clinic and will not be stored at home. NOTE: This sheet is a summary. It may not cover all possible information. If you have questions  about this medicine, talk to your doctor, pharmacist, or health care provider.  2015, Elsevier/Gold Standard. (2012-11-15 22:21:02)   Cyclophosphamide injection What is this medicine? CYCLOPHOSPHAMIDE (sye kloe FOSS fa mide) is a chemotherapy drug. It slows the growth of cancer cells. This medicine is used to treat many types of cancer like lymphoma, myeloma, leukemia, breast cancer, and ovarian cancer, to name a few. This medicine may be used for other purposes; ask your health care provider or pharmacist if you have questions. COMMON BRAND NAME(S): Cytoxan, Neosar What should I tell my health care provider before I take this medicine? They need to know if you have any of these conditions: -blood disorders -history of other chemotherapy -infection -kidney disease -liver disease -recent or ongoing radiation therapy -tumors in the bone marrow -an unusual or allergic reaction to cyclophosphamide, other chemotherapy, other medicines, foods, dyes, or preservatives -pregnant or trying to get pregnant -breast-feeding How should I use this medicine? This drug is usually given as an injection into a vein or muscle or by infusion into a vein. It is administered in a hospital or clinic by a specially trained health care professional. Talk to your pediatrician regarding the use of this medicine in children. Special care may be needed. Overdosage: If you think you have taken too much of this medicine contact a poison control center or emergency room at once. NOTE: This medicine is only for you. Do not share this medicine with others. What if I miss a dose? It is important not to miss your dose. Call your doctor or health care professional if you are unable to keep an appointment. What may interact with this medicine? This medicine may interact with the following medications: -amiodarone -amphotericin B -azathioprine -certain antiviral medicines for HIV or AIDS such as protease inhibitors (e.g.,  indinavir, ritonavir) and zidovudine -certain blood pressure medications such as benazepril, captopril, enalapril, fosinopril, lisinopril, moexipril, monopril, perindopril, quinapril, ramipril, trandolapril -certain cancer medications such as anthracyclines (e.g., daunorubicin, doxorubicin), busulfan, cytarabine, paclitaxel, pentostatin, tamoxifen, trastuzumab -certain diuretics such as chlorothiazide, chlorthalidone, hydrochlorothiazide, indapamide, metolazone -certain medicines that treat or prevent blood clots like warfarin -certain muscle relaxants such as succinylcholine -cyclosporine -etanercept -indomethacin -medicines to increase blood counts like filgrastim, pegfilgrastim, sargramostim -medicines used as general anesthesia -metronidazole -natalizumab This list may not describe all possible interactions. Give your health care provider a list of all the medicines, herbs, non-prescription drugs, or dietary supplements you use. Also tell them if you smoke, drink alcohol, or use illegal drugs. Some items may interact with your medicine. What should I watch for while using this medicine? Visit your doctor for checks on your progress. This drug may make you feel generally unwell. This is not uncommon, as chemotherapy can affect healthy cells as well as cancer cells. Report any side effects. Continue your course of treatment even though you feel ill unless your doctor tells you to stop. Drink water or other fluids as directed. Urinate often, even at night. In some cases, you may be given additional medicines to  help with side effects. Follow all directions for their use. Call your doctor or health care professional for advice if you get a fever, chills or sore throat, or other symptoms of a cold or flu. Do not treat yourself. This drug decreases your body's ability to fight infections. Try to avoid being around people who are sick. This medicine may increase your risk to bruise or bleed. Call  your doctor or health care professional if you notice any unusual bleeding. Be careful brushing and flossing your teeth or using a toothpick because you may get an infection or bleed more easily. If you have any dental work done, tell your dentist you are receiving this medicine. You may get drowsy or dizzy. Do not drive, use machinery, or do anything that needs mental alertness until you know how this medicine affects you. Do not become pregnant while taking this medicine or for 1 year after stopping it. Women should inform their doctor if they wish to become pregnant or think they might be pregnant. Men should not father a child while taking this medicine and for 4 months after stopping it. There is a potential for serious side effects to an unborn child. Talk to your health care professional or pharmacist for more information. Do not breast-feed an infant while taking this medicine. This medicine may interfere with the ability to have a child. This medicine has caused ovarian failure in some women. This medicine has caused reduced sperm counts in some men. You should talk with your doctor or health care professional if you are concerned about your fertility. If you are going to have surgery, tell your doctor or health care professional that you have taken this medicine. What side effects may I notice from receiving this medicine? Side effects that you should report to your doctor or health care professional as soon as possible: -allergic reactions like skin rash, itching or hives, swelling of the face, lips, or tongue -low blood counts - this medicine may decrease the number of white blood cells, red blood cells and platelets. You may be at increased risk for infections and bleeding. -signs of infection - fever or chills, cough, sore throat, pain or difficulty passing urine -signs of decreased platelets or bleeding - bruising, pinpoint red spots on the skin, black, tarry stools, blood in the  urine -signs of decreased red blood cells - unusually weak or tired, fainting spells, lightheadedness -breathing problems -dark urine -dizziness -palpitations -swelling of the ankles, feet, hands -trouble passing urine or change in the amount of urine -weight gain -yellowing of the eyes or skin Side effects that usually do not require medical attention (report to your doctor or health care professional if they continue or are bothersome): -changes in nail or skin color -hair loss -missed menstrual periods -mouth sores -nausea, vomiting This list may not describe all possible side effects. Call your doctor for medical advice about side effects. You may report side effects to FDA at 1-800-FDA-1088. Where should I keep my medicine? This drug is given in a hospital or clinic and will not be stored at home. NOTE: This sheet is a summary. It may not cover all possible information. If you have questions about this medicine, talk to your doctor, pharmacist, or health care provider.  2015, Elsevier/Gold Standard. (2011-11-04 16:22:58)

## 2014-02-05 ENCOUNTER — Encounter (HOSPITAL_BASED_OUTPATIENT_CLINIC_OR_DEPARTMENT_OTHER): Payer: Medicare Other

## 2014-02-05 DIAGNOSIS — C50912 Malignant neoplasm of unspecified site of left female breast: Secondary | ICD-10-CM

## 2014-02-05 DIAGNOSIS — Z5189 Encounter for other specified aftercare: Secondary | ICD-10-CM

## 2014-02-05 MED ORDER — PEGFILGRASTIM INJECTION 6 MG/0.6ML ~~LOC~~
PREFILLED_SYRINGE | SUBCUTANEOUS | Status: AC
  Filled 2014-02-05: qty 0.6

## 2014-02-05 MED ORDER — PEGFILGRASTIM INJECTION 6 MG/0.6ML ~~LOC~~
6.0000 mg | PREFILLED_SYRINGE | Freq: Once | SUBCUTANEOUS | Status: AC
  Administered 2014-02-05: 6 mg via SUBCUTANEOUS

## 2014-02-05 NOTE — Assessment & Plan Note (Signed)
Pleasant 80 year old female currently receiving Taxotere and Cytoxan for left breast cancer, triple negative. She has done surprisingly well with therapy. She had some difficulties with oral intake after her first cycle but this has improved. She also had some difficulty with Neulasta associated bone pain but this has also improved. Based on laboratory studies today she is cleared for her next cycle of chemotherapy. We discussed referring her for genetics counseling as she is finally interested in proceeding. If she has any other interim problems or concerns and advised her to call. She does have a very mild Taxotere rash to the hands, I do not feel a need to dose reduce her yet. I did prescribe her some triamcinolone and Eucerin cream. I advised her to call if the rash worsens after this treatment cycle.

## 2014-02-05 NOTE — Patient Instructions (Signed)
[facility] at [facility] Discharge Instructions  RECOMMENDATIONS MADE BY THE CONSULTANT AND ANY TEST RESULTS WILL BE SENT TO YOUR REFERRING PHYSICIAN.  Neulasta injection today as ordered. Return as scheduled.  Thank you for choosing [facility] at [facility] to provide your oncology and hematology care.  To afford each patient quality time with our provider, please arrive at least 15 minutes before your scheduled appointment time.    You need to re-schedule your appointment should you arrive 10 or more minutes late.  We strive to give you quality time with our providers, and arriving late affects you and other patients whose appointments are after yours.  Also, if you no show three or more times for appointments you may be dismissed from the clinic at the providers discretion.     Again, thank you for choosing [facility].  Our hope is that these requests will decrease the amount of time that you wait before being seen by our physicians.       _____________________________________________________________  Should you have questions after your visit to [facility], please contact our office at [phone] between the hours of 8:30 a.m. and 4:30 p.m.  Voicemails left after 4:30 p.m. will not be returned until the following business day.  For prescription refill requests, have your pharmacy contact our office.    

## 2014-02-05 NOTE — Progress Notes (Signed)
Reports diarrhea yesterday 1 day post chemo. Reports between 4-6 loose stools. Reports taking 1 Imodium. Denies any diarrhea today. No other complaints voiced. Tiffany Sweeney presents today for injection per MD orders. Neulasta 6mg  administered SQ in right Abdomen. Administration without incident. Patient tolerated well.

## 2014-02-18 ENCOUNTER — Telehealth (HOSPITAL_COMMUNITY): Payer: Self-pay | Admitting: *Deleted

## 2014-02-18 NOTE — Telephone Encounter (Signed)
Patient called to let me know that the bottom of her feet and fingertips were very sensitive. She said it seems to be worse than it was before. No redness to hands or peeling. Dr. Whitney Muse notified earlier of this and she says that it is coming from the treatment. Patient reminded to lotion hands and feet, to avoid warm/hot temps, to avoid friction, and to wear socks and comfortable shoes. She verbalized understanding of these instructions.

## 2014-02-24 ENCOUNTER — Encounter (HOSPITAL_BASED_OUTPATIENT_CLINIC_OR_DEPARTMENT_OTHER): Payer: Medicare Other | Admitting: Hematology & Oncology

## 2014-02-24 ENCOUNTER — Ambulatory Visit (HOSPITAL_COMMUNITY): Payer: Medicare Other | Admitting: Hematology & Oncology

## 2014-02-24 ENCOUNTER — Encounter (HOSPITAL_COMMUNITY): Payer: Self-pay | Admitting: Hematology & Oncology

## 2014-02-24 ENCOUNTER — Encounter (HOSPITAL_BASED_OUTPATIENT_CLINIC_OR_DEPARTMENT_OTHER): Payer: Medicare Other

## 2014-02-24 VITALS — BP 142/74 | HR 90 | Temp 98.4°F | Resp 16 | Wt 153.5 lb

## 2014-02-24 DIAGNOSIS — R5383 Other fatigue: Secondary | ICD-10-CM

## 2014-02-24 DIAGNOSIS — M810 Age-related osteoporosis without current pathological fracture: Secondary | ICD-10-CM

## 2014-02-24 DIAGNOSIS — C50919 Malignant neoplasm of unspecified site of unspecified female breast: Secondary | ICD-10-CM

## 2014-02-24 DIAGNOSIS — C50911 Malignant neoplasm of unspecified site of right female breast: Secondary | ICD-10-CM | POA: Diagnosis not present

## 2014-02-24 DIAGNOSIS — Z170 Estrogen receptor positive status [ER+]: Secondary | ICD-10-CM

## 2014-02-24 LAB — CBC WITH DIFFERENTIAL/PLATELET
BASOS ABS: 0 10*3/uL (ref 0.0–0.1)
Basophils Relative: 0 % (ref 0–1)
EOS ABS: 0 10*3/uL (ref 0.0–0.7)
Eosinophils Relative: 0 % (ref 0–5)
HEMATOCRIT: 33.3 % — AB (ref 36.0–46.0)
Hemoglobin: 11 g/dL — ABNORMAL LOW (ref 12.0–15.0)
LYMPHS PCT: 11 % — AB (ref 12–46)
Lymphs Abs: 1.5 10*3/uL (ref 0.7–4.0)
MCH: 29.9 pg (ref 26.0–34.0)
MCHC: 33 g/dL (ref 30.0–36.0)
MCV: 90.5 fL (ref 78.0–100.0)
MONO ABS: 1.1 10*3/uL — AB (ref 0.1–1.0)
MONOS PCT: 8 % (ref 3–12)
NEUTROS PCT: 81 % — AB (ref 43–77)
Neutro Abs: 11.6 10*3/uL — ABNORMAL HIGH (ref 1.7–7.7)
PLATELETS: 272 10*3/uL (ref 150–400)
RBC: 3.68 MIL/uL — ABNORMAL LOW (ref 3.87–5.11)
RDW: 17.6 % — AB (ref 11.5–15.5)
WBC: 14.1 10*3/uL — ABNORMAL HIGH (ref 4.0–10.5)

## 2014-02-24 LAB — COMPREHENSIVE METABOLIC PANEL
ALT: 17 U/L (ref 0–35)
ANION GAP: 5 (ref 5–15)
AST: 25 U/L (ref 0–37)
Albumin: 3.4 g/dL — ABNORMAL LOW (ref 3.5–5.2)
Alkaline Phosphatase: 61 U/L (ref 39–117)
BUN: 12 mg/dL (ref 6–23)
CO2: 25 mmol/L (ref 19–32)
Calcium: 8.7 mg/dL (ref 8.4–10.5)
Chloride: 108 mmol/L (ref 96–112)
Creatinine, Ser: 0.75 mg/dL (ref 0.50–1.10)
GFR calc Af Amer: >90 mL/min (ref >90)
GFR calc non Af Amer: 80 mL/min — ABNORMAL LOW (ref >90)
Glucose, Bld: 193 mg/dL — ABNORMAL HIGH (ref 70–99)
POTASSIUM: 3.6 mmol/L (ref 3.5–5.1)
SODIUM: 138 mmol/L (ref 135–145)
Total Bilirubin: 0.6 mg/dL (ref 0.3–1.2)
Total Protein: 6 g/dL (ref 6.0–8.3)

## 2014-02-24 MED ORDER — HEPARIN SOD (PORK) LOCK FLUSH 100 UNIT/ML IV SOLN
500.0000 [IU] | Freq: Once | INTRAVENOUS | Status: AC
  Administered 2014-02-24: 500 [IU] via INTRAVENOUS

## 2014-02-24 MED ORDER — HEPARIN SOD (PORK) LOCK FLUSH 100 UNIT/ML IV SOLN
INTRAVENOUS | Status: AC
  Filled 2014-02-24: qty 5

## 2014-02-24 MED ORDER — SODIUM CHLORIDE 0.9 % IJ SOLN
10.0000 mL | INTRAMUSCULAR | Status: DC | PRN
  Administered 2014-02-24: 10 mL via INTRAVENOUS
  Filled 2014-02-24: qty 10

## 2014-02-24 MED ORDER — LORAZEPAM 0.5 MG PO TABS: 0.5000 mg | ORAL_TABLET | ORAL | Status: DC | PRN

## 2014-02-24 NOTE — Progress Notes (Signed)
Tiffany Bellow, MD [address] [place] [address]  Stage I triple negative carcinoma of the left breast s/p mastectomy and axillary LN disssection.  CURRENT THERAPY: TC started on 12/20/2013  INTERVAL HISTORY: Tiffany Sweeney 80 y.o. female returns for follow-up of her breast cancer and fourth cycle of Taxotere and Cytoxan. Unfortunately her feet are quite sensitive and red. She is able to walk but states she limits it because it is very uncomfortable. Her fingertips are sensitive although not to the extent of her feet. She can button shirts without any difficulty. She also reports worsening fatigue and shortness of breath. She feels the shortness of breath is more related to her fatigue because she states she notices it after taking the stairs, showering and dressing, or doing her ADLs. He is still eating and her weight is stable.  MEDICAL HISTORY: Past Medical History  Diagnosis Date  . High cholesterol   . Hypertension   . Anxiety   . Cancer     breast- right  . Arthritis   . Breast cancer     has Low back pain and Breast cancer on her problem list.      Breast cancer   07/25/2013 Imaging Plain film of the lumbar spine, complete 4 view. Chronic lumbar degenerative change most prominent at L4-5. No acute abnormality and no change from prior study   10/21/2013 Initial Diagnosis Left breast cancer, core biopsy, ER negative, PR negative, HER-2/neu negative, Ki-67 of 13%, infiltrating duct cell   11/04/2013 Surgery Left modified radical mastectomy, 1.8 cm infiltrating duct cell carcinoma, ER negative, PR negative, HER-2/neu not overexpressed, 9 lymph nodes negative. Stage TI cN0 M0.   12/06/2013 Imaging Portable chest x-ray showing no pneumothorax no lung edema or consolidation Port-A-Cath tip in the SVC.   12/23/2013 -  Chemotherapy Cycle 1 of TC initiated on 12/23/2013     has No Known Allergies.  Tiffany Sweeney had no medications administered during this  visit.  SURGICAL HISTORY: Past Surgical History  Procedure Laterality Date  . Abdominal hysterectomy    . Mastectomy Right   . Cataract extraction w/phaco Left 03/11/2013    Procedure: CATARACT EXTRACTION PHACO AND INTRAOCULAR LENS PLACEMENT (IOC);  Surgeon: Tonny Branch, MD;  Location: [facility];  Service: Ophthalmology;  Laterality: Left;  CDE:7.54  . Cataract extraction w/phaco Right 04/15/2013    Procedure: CATARACT EXTRACTION PHACO AND INTRAOCULAR LENS PLACEMENT (IOC);  Surgeon: Tonny Branch, MD;  Location: [facility];  Service: Ophthalmology;  Laterality: Right;  CDE 9.38  . Mastectomy modified radical Left 11/04/2013    Procedure: LEFT MASTECTOMY MODIFIED RADICAL;  Surgeon: Jamesetta So, MD;  Location: [facility];  Service: General;  Laterality: Left;  . Portacath placement Right 12/06/2013    Procedure: INSERTION PORT-A-CATH-Right subclavian;  Surgeon: Jamesetta So, MD;  Location: [facility];  Service: General;  Laterality: Right;    SOCIAL HISTORY: History   Social History  . Marital Status: Widowed    Spouse Name: N/A  . Number of Children: N/A  . Years of Education: N/A   Occupational History  . Not on file.   Social History Main Topics  . Smoking status: Former Smoker -- 0.25 packs/day for 30 years    Types: Cigarettes    Start date: 10/10/1956    Quit date: 03/08/1985  . Smokeless tobacco: Never Used     Comment: quit 25 years ago  . Alcohol Use: No  . Drug Use: No  . Sexual  Activity: Yes    Birth Control/ Protection: Post-menopausal   Other Topics Concern  . Not on file   Social History Narrative    FAMILY HISTORY: Family History  Problem Relation Age of Onset  . Cancer Mother     died at 4, had anal cancer but died from complications of diabetes  . Diabetes Mother   . Dementia Father     Died at 25 in an accident  . Pancreatic cancer Brother     age 3  . Lung cancer Sister     age 20  . Ovarian cancer Sister     age 68    Review of Systems  Constitutional:  Positive for malaise/fatigue. Negative for fever, chills and weight loss.  HENT: Negative for congestion, hearing loss, nosebleeds, sore throat and tinnitus.   Eyes: Negative for blurred vision, double vision, pain and discharge.  Respiratory: Positive for shortness of breath. Negative for cough, hemoptysis, sputum production and wheezing.   Cardiovascular: Negative for chest pain, palpitations, claudication, leg swelling and PND.  Gastrointestinal: Negative for heartburn, nausea, vomiting, abdominal pain, diarrhea, constipation, blood in stool and melena.  Genitourinary: Negative for dysuria, urgency, frequency and hematuria.  Musculoskeletal: Negative for myalgias, joint pain and falls.  Skin: Negative for itching and rash.       Red/tender feet, mild peeling  Neurological: Positive for weakness. Negative for dizziness, tingling, tremors, sensory change, speech change, focal weakness, seizures, loss of consciousness and headaches.  Endo/Heme/Allergies: Does not bruise/bleed easily.  Psychiatric/Behavioral: Negative for depression, suicidal ideas, memory loss and substance abuse. The patient is not nervous/anxious and does not have insomnia.     PHYSICAL EXAMINATION  ECOG PERFORMANCE STATUS: 1 - Symptomatic but completely ambulatory  Filed Vitals:   02/24/14 0830  BP: 142/74  Pulse: 90  Temp: 98.4 F (36.9 C)  Resp: 16    Physical Exam  Constitutional: She is oriented to person, place, and time and well-developed, well-nourished, and in no distress.  HENT:  Head: Normocephalic and atraumatic.  Nose: Nose normal.  Mouth/Throat: Oropharynx is clear and moist. No oropharyngeal exudate.  Alopecia, wearing a scarf   Eyes: Conjunctivae and EOM are normal. Pupils are equal, round, and reactive to light. Right eye exhibits no discharge. Left eye exhibits no discharge. No scleral icterus.  Neck: Normal range of motion. Neck supple. No tracheal deviation present. No thyromegaly present.    Cardiovascular: Normal rate, regular rhythm and normal heart sounds.  Exam reveals no gallop and no friction rub.   No murmur heard. Pulmonary/Chest: Effort normal and breath sounds normal. She has no wheezes. She has no rales.  Abdominal: Soft. Bowel sounds are normal. She exhibits no distension and no mass. There is no tenderness. There is no rebound and no guarding.  Musculoskeletal: Normal range of motion. She exhibits no edema.  Left lower extremity ankle swelling is chronic. Skin of the feet are examined and erythematous toes greater than the balls of the feet. She has mild skin peeling. She complains of discomfort with palpation.  Lymphadenopathy:    She has no cervical adenopathy.  Neurological: She is alert and oriented to person, place, and time. She has normal reflexes. No cranial nerve deficit. Coordination normal.  Date is slightly abnormal today but not because of neurologic deficit it is secondary to her sore feet.  Skin: Skin is warm and dry. No rash noted.  Psychiatric: Mood, memory, affect and judgment normal.  Nursing note and vitals reviewed.   LABORATORY  DATA:  CBC    Component Value Date/Time   WBC 14.1* 02/24/2014 0830   WBC 6.7 10/10/2013 1010   WBC 7.9 07/06/2006 1141   RBC 3.68* 02/24/2014 0830   RBC 4.70 10/10/2013 1010   RBC 4.45 07/06/2006 1141   HGB 11.0* 02/24/2014 0830   HGB 14.0 10/10/2013 1010   HGB 13.0 07/06/2006 1141   HCT 33.3* 02/24/2014 0830   HCT 41.8 10/10/2013 1010   HCT 38.2 07/06/2006 1141   PLT 272 02/24/2014 0830   PLT 220 10/10/2013 1010   PLT 272 07/06/2006 1141   MCV 90.5 02/24/2014 0830   MCV 89 10/10/2013 1010   MCV 85.8 07/06/2006 1141   MCH 29.9 02/24/2014 0830   MCH 29.8 10/10/2013 1010   MCH 29.3 07/06/2006 1141   MCHC 33.0 02/24/2014 0830   MCHC 33.5 10/10/2013 1010   MCHC 34.1 07/06/2006 1141   RDW 17.6* 02/24/2014 0830   RDW 13.3 10/10/2013 1010   RDW 11.2* 07/06/2006 1141   LYMPHSABS 1.5 02/24/2014 0830    LYMPHSABS 2.0 10/10/2013 1010   LYMPHSABS 2.1 07/06/2006 1141   MONOABS 1.1* 02/24/2014 0830   MONOABS 0.6 07/06/2006 1141   EOSABS 0.0 02/24/2014 0830   EOSABS 0.1 10/10/2013 1010   EOSABS 0.1 07/06/2006 1141   BASOSABS 0.0 02/24/2014 0830   BASOSABS 0.0 10/10/2013 1010   BASOSABS 0.1 07/06/2006 1141   CMP     Component Value Date/Time   NA 138 02/24/2014 0830   K 3.6 02/24/2014 0830   CL 108 02/24/2014 0830   CO2 25 02/24/2014 0830   GLUCOSE 193* 02/24/2014 0830   BUN 12 02/24/2014 0830   CREATININE 0.75 02/24/2014 0830   CALCIUM 8.7 02/24/2014 0830   PROT 6.0 02/24/2014 0830   ALBUMIN 3.4* 02/24/2014 0830   AST 25 02/24/2014 0830   ALT 17 02/24/2014 0830   ALKPHOS 61 02/24/2014 0830   BILITOT 0.6 02/24/2014 0830   GFRNONAA 80* 02/24/2014 0830   GFRAA >90 02/24/2014 0830       ASSESSMENT and THERAPY PLAN:    Breast cancer This is a pleasant 80 year old female with a history of breast cancer of the right breast several years back. She is now been diagnosed with a triple negative carcinoma of the left breast, stage I. She is currently here today to receive her last cycle of Taxotere and Cytoxan. She has done extremely well with therapy. Unfortunately today she has evidence of significant skin toxicity in the feet. She is also very fatigued and it is limiting her ADLs. I have recommended a one-week interruption in therapy. I do not feel treating her today is in her best interest. We will dose reduce her and plan on completing her therapy next Monday. I have advised her if her symptoms do not improve over the next several days she is to promptly let us know.     All questions were answered. The patient knows to call the clinic with any problems, questions or concerns. We can certainly see the patient much sooner if necessary.   Molli Hazard 02/24/2014

## 2014-02-24 NOTE — Assessment & Plan Note (Signed)
This is a pleasant 80 year old female with a history of breast cancer of the right breast several years back. She is now been diagnosed with a triple negative carcinoma of the left breast, stage I. She is currently here today to receive her last cycle of Taxotere and Cytoxan. She has done extremely well with therapy. Unfortunately today she has evidence of significant skin toxicity in the feet. She is also very fatigued and it is limiting her ADLs. I have recommended a one-week interruption in therapy. I do not feel treating her today is in her best interest. We will dose reduce her and plan on completing her therapy next Monday. I have advised her if her symptoms do not improve over the next several days she is to promptly let us know.

## 2014-02-24 NOTE — Progress Notes (Signed)
Hold treatment today.  Reschedule for next Monday.  Tiffany Sweeney presented for Portacath access and flush.  Proper placement of portacath confirmed by CXR.  Portacath located right chest wall accessed with  H 20 needle.  Good blood return present. Portacath flushed with 65ml NS and 500U/57ml Heparin and needle removed intact.  Procedure tolerated well and without incident.

## 2014-02-24 NOTE — Patient Instructions (Addendum)
..  [place] at [facility] Discharge Instructions  RECOMMENDATIONS MADE BY THE CONSULTANT AND ANY TEST RESULTS WILL BE SENT TO YOUR REFERRING PHYSICIAN. We will hold chemo today.  May need to dose reduce based on your fatigue, shortness of breath and neuropathy. Return next week   Thank you for choosing [place] at [facility] to provide your oncology and hematology care.  To afford each patient quality time with our provider, please arrive at least 15 minutes before your scheduled appointment time.    You need to re-schedule your appointment should you arrive 10 or more minutes late.  We strive to give you quality time with our providers, and arriving late affects you and other patients whose appointments are after yours.  Also, if you no show three or more times for appointments you may be dismissed from the clinic at the providers discretion.     Again, thank you for choosing [facility].  Our hope is that these requests will decrease the amount of time that you wait before being seen by our physicians.       _____________________________________________________________  Should you have questions after your visit to [facility], please contact our office at [phone] between the hours of 8:30 a.m. and 4:30 p.m.  Voicemails left after 4:30 p.m. will not be returned until the following business day.  For prescription refill requests, have your pharmacy contact our office.

## 2014-02-26 ENCOUNTER — Ambulatory Visit (HOSPITAL_COMMUNITY): Payer: Medicare Other

## 2014-02-28 ENCOUNTER — Telehealth (HOSPITAL_COMMUNITY): Payer: Self-pay | Admitting: *Deleted

## 2014-03-03 ENCOUNTER — Encounter (HOSPITAL_BASED_OUTPATIENT_CLINIC_OR_DEPARTMENT_OTHER): Payer: Medicare Other

## 2014-03-03 ENCOUNTER — Encounter (HOSPITAL_COMMUNITY): Payer: Self-pay | Admitting: Hematology & Oncology

## 2014-03-03 ENCOUNTER — Encounter (HOSPITAL_BASED_OUTPATIENT_CLINIC_OR_DEPARTMENT_OTHER): Payer: Medicare Other | Admitting: Hematology & Oncology

## 2014-03-03 VITALS — BP 149/65 | HR 89 | Temp 98.1°F | Resp 18 | Wt 155.4 lb

## 2014-03-03 DIAGNOSIS — R5383 Other fatigue: Secondary | ICD-10-CM

## 2014-03-03 DIAGNOSIS — C50919 Malignant neoplasm of unspecified site of unspecified female breast: Secondary | ICD-10-CM

## 2014-03-03 DIAGNOSIS — C50911 Malignant neoplasm of unspecified site of right female breast: Secondary | ICD-10-CM | POA: Diagnosis not present

## 2014-03-03 DIAGNOSIS — Z171 Estrogen receptor negative status [ER-]: Secondary | ICD-10-CM

## 2014-03-03 DIAGNOSIS — C50412 Malignant neoplasm of upper-outer quadrant of left female breast: Secondary | ICD-10-CM

## 2014-03-03 DIAGNOSIS — R0600 Dyspnea, unspecified: Secondary | ICD-10-CM

## 2014-03-03 DIAGNOSIS — R609 Edema, unspecified: Secondary | ICD-10-CM

## 2014-03-03 DIAGNOSIS — C50411 Malignant neoplasm of upper-outer quadrant of right female breast: Secondary | ICD-10-CM

## 2014-03-03 LAB — CBC WITH DIFFERENTIAL/PLATELET
BASOS PCT: 0 % (ref 0–1)
Basophils Absolute: 0 10*3/uL (ref 0.0–0.1)
Eosinophils Absolute: 0 10*3/uL (ref 0.0–0.7)
Eosinophils Relative: 0 % (ref 0–5)
HCT: 34.8 % — ABNORMAL LOW (ref 36.0–46.0)
Hemoglobin: 11.3 g/dL — ABNORMAL LOW (ref 12.0–15.0)
Lymphocytes Relative: 7 % — ABNORMAL LOW (ref 12–46)
Lymphs Abs: 0.9 10*3/uL (ref 0.7–4.0)
MCH: 29.7 pg (ref 26.0–34.0)
MCHC: 32.5 g/dL (ref 30.0–36.0)
MCV: 91.6 fL (ref 78.0–100.0)
MONO ABS: 0.9 10*3/uL (ref 0.1–1.0)
MONOS PCT: 6 % (ref 3–12)
NEUTROS ABS: 12.3 10*3/uL — AB (ref 1.7–7.7)
Neutrophils Relative %: 87 % — ABNORMAL HIGH (ref 43–77)
Platelets: 250 10*3/uL (ref 150–400)
RBC: 3.8 MIL/uL — ABNORMAL LOW (ref 3.87–5.11)
RDW: 17.5 % — ABNORMAL HIGH (ref 11.5–15.5)
WBC: 14.1 10*3/uL — ABNORMAL HIGH (ref 4.0–10.5)

## 2014-03-03 LAB — COMPREHENSIVE METABOLIC PANEL
ALBUMIN: 3.5 g/dL (ref 3.5–5.2)
ALT: 17 U/L (ref 0–35)
AST: 24 U/L (ref 0–37)
Alkaline Phosphatase: 55 U/L (ref 39–117)
Anion gap: 7 (ref 5–15)
BILIRUBIN TOTAL: 0.5 mg/dL (ref 0.3–1.2)
BUN: 19 mg/dL (ref 6–23)
CHLORIDE: 108 mmol/L (ref 96–112)
CO2: 25 mmol/L (ref 19–32)
Calcium: 8.8 mg/dL (ref 8.4–10.5)
Creatinine, Ser: 0.81 mg/dL (ref 0.50–1.10)
GFR calc Af Amer: 79 mL/min — ABNORMAL LOW (ref >90)
GFR calc non Af Amer: 68 mL/min — ABNORMAL LOW (ref >90)
Glucose, Bld: 176 mg/dL — ABNORMAL HIGH (ref 70–99)
Potassium: 3.6 mmol/L (ref 3.5–5.1)
Sodium: 140 mmol/L (ref 135–145)
Total Protein: 5.9 g/dL — ABNORMAL LOW (ref 6.0–8.3)

## 2014-03-03 MED ORDER — SODIUM CHLORIDE 0.9 % IV SOLN
INTRAVENOUS | Status: DC
  Administered 2014-03-03: 10:00:00 via INTRAVENOUS

## 2014-03-03 MED ORDER — HEPARIN SOD (PORK) LOCK FLUSH 100 UNIT/ML IV SOLN
INTRAVENOUS | Status: AC
  Filled 2014-03-03: qty 5

## 2014-03-03 MED ORDER — HEPARIN SOD (PORK) LOCK FLUSH 100 UNIT/ML IV SOLN
500.0000 [IU] | Freq: Once | INTRAVENOUS | Status: AC
  Administered 2014-03-03: 500 [IU] via INTRAVENOUS

## 2014-03-03 NOTE — Progress Notes (Signed)
[name], MD [address] [place] [address]  Stage I triple negative carcinoma of the left breast s/p mastectomy and axillary LN disssection. History of Carcinoma of the R breast  CURRENT THERAPY: TC started on 12/20/2013  INTERVAL HISTORY: Tiffany Sweeney 80 y.o. female returns for follow-up of her breast cancer. He was due for her fourth cycle of Taxotere and Cytoxan last week, this was held secondary to the development of skin toxicity and exertional fatigue. She continues to not feel well. She describes her feet is very tender. After shower she has to lay down for 45 minutes. She has 5 stairs that she goes down to do laundry and states that the 5 stairs exhaust her. She denies any palpitations or chest pain. She denies any orthopnea. She has no nausea or vomiting. She is questioning if she can even tolerate a last cycle of chemotherapy.   MEDICAL HISTORY: Past Medical History  Diagnosis Date  . High cholesterol   . Hypertension   . Anxiety   . Cancer     breast- right  . Arthritis   . Breast cancer     has Low back pain and Breast cancer on her problem list.      Breast cancer   07/25/2013 Imaging Plain film of the lumbar spine, complete 4 view. Chronic lumbar degenerative change most prominent at L4-5. No acute abnormality and no change from prior study   10/21/2013 Initial Diagnosis Left breast cancer, core biopsy, ER negative, PR negative, HER-2/neu negative, Ki-67 of 13%, infiltrating duct cell   11/04/2013 Surgery Left modified radical mastectomy, 1.8 cm infiltrating duct cell carcinoma, ER negative, PR negative, HER-2/neu not overexpressed, 9 lymph nodes negative. Stage TI cN0 M0.   12/06/2013 Imaging Portable chest x-ray showing no pneumothorax no lung edema or consolidation Port-A-Cath tip in the SVC.   12/23/2013 -  Chemotherapy Cycle 1 of TC initiated on 12/23/2013     has No Known Allergies.  Ms. Laguardia does not currently have  medications on file.  SURGICAL HISTORY: Past Surgical History  Procedure Laterality Date  . Abdominal hysterectomy    . Mastectomy Right   . Cataract extraction w/phaco Left 03/11/2013    Procedure: CATARACT EXTRACTION PHACO AND INTRAOCULAR LENS PLACEMENT (IOC);  Surgeon: Tonny Branch, MD;  Location: [facility];  Service: Ophthalmology;  Laterality: Left;  CDE:7.54  . Cataract extraction w/phaco Right 04/15/2013    Procedure: CATARACT EXTRACTION PHACO AND INTRAOCULAR LENS PLACEMENT (IOC);  Surgeon: Tonny Branch, MD;  Location: [facility];  Service: Ophthalmology;  Laterality: Right;  CDE 9.38  . Mastectomy modified radical Left 11/04/2013    Procedure: LEFT MASTECTOMY MODIFIED RADICAL;  Surgeon: Jamesetta So, MD;  Location: [facility];  Service: General;  Laterality: Left;  . Portacath placement Right 12/06/2013    Procedure: INSERTION PORT-A-CATH-Right subclavian;  Surgeon: Jamesetta So, MD;  Location: [facility];  Service: General;  Laterality: Right;    SOCIAL HISTORY: History   Social History  . Marital Status: Widowed    Spouse Name: N/A  . Number of Children: N/A  . Years of Education: N/A   Occupational History  . Not on file.   Social History Main Topics  . Smoking status: Former Smoker -- 0.25 packs/day for 30 years    Types: Cigarettes    Start date: 10/10/1956    Quit date: 03/08/1985  . Smokeless tobacco: Never Used     Comment: quit 25 years ago  . Alcohol  Use: No  . Drug Use: No  . Sexual Activity: Yes    Birth Control/ Protection: Post-menopausal   Other Topics Concern  . Not on file   Social History Narrative    FAMILY HISTORY: Family History  Problem Relation Age of Onset  . Cancer Mother     died at 21, had anal cancer but died from complications of diabetes  . Diabetes Mother   . Dementia Father     Died at 28 in an accident  . Pancreatic cancer Brother     age 19  . Lung cancer Sister     age 19  . Ovarian cancer Sister     age 93    Review of Systems    Constitutional: Positive for malaise/fatigue.  HENT: Negative.   Eyes: Negative.   Respiratory: Negative.   Cardiovascular: Negative.   Gastrointestinal: Negative.   Genitourinary: Negative.   Musculoskeletal: Positive for joint pain.  Skin:       Erythema to bilateral base of feet, peeling of toes  Neurological: Positive for weakness. Negative for dizziness, tingling, tremors, sensory change, speech change, focal weakness, seizures and loss of consciousness.  Endo/Heme/Allergies: Negative.   Psychiatric/Behavioral: Negative.     PHYSICAL EXAMINATION  ECOG PERFORMANCE STATUS: 1 - Symptomatic but completely ambulatory  Filed Vitals:   03/03/14 0900  BP: 149/65  Pulse: 89  Temp: 98.1 F (36.7 C)  Resp: 18    Physical Exam  Constitutional: She is oriented to person, place, and time and well-developed, well-nourished, and in no distress.  Lying in bed, able to sit without difficulty  HENT:  Head: Normocephalic and atraumatic.  Nose: Nose normal.  Mouth/Throat: Oropharynx is clear and moist. No oropharyngeal exudate.  Alopecia, wearing a scarf   Eyes: Conjunctivae and EOM are normal. Pupils are equal, round, and reactive to light. Right eye exhibits no discharge. Left eye exhibits no discharge. No scleral icterus.  Neck: Normal range of motion. Neck supple. No tracheal deviation present. No thyromegaly present.  Cardiovascular: Normal rate, regular rhythm and normal heart sounds.  Exam reveals no gallop and no friction rub.   No murmur heard. Pulmonary/Chest: Effort normal and breath sounds normal. She has no wheezes. She has no rales.  Abdominal: Soft. Bowel sounds are normal. She exhibits no distension and no mass. There is no tenderness. There is no rebound and no guarding.  Musculoskeletal: Normal range of motion. She exhibits no edema.  Left lower extremity ankle swelling is chronic. Skin of the feet are examined and erythematous toes greater than the balls of the feet.  She has mild skin peeling. She complains of discomfort with palpation.  Lymphadenopathy:    She has no cervical adenopathy.  Neurological: She is alert and oriented to person, place, and time. She has normal reflexes. No cranial nerve deficit. Coordination normal.  Gait is slightly abnormal today but not because of neurologic deficit it is secondary to her sore feet.  Skin: Skin is warm and dry. No rash noted.  Psychiatric: Mood, memory, affect and judgment normal.  Nursing note and vitals reviewed.   LABORATORY DATA:  CBC    Component Value Date/Time   WBC 14.1* 03/03/2014 0907   WBC 6.7 10/10/2013 1010   WBC 7.9 07/06/2006 1141   RBC 3.80* 03/03/2014 0907   RBC 4.70 10/10/2013 1010   RBC 4.45 07/06/2006 1141   HGB 11.3* 03/03/2014 0907   HGB 14.0 10/10/2013 1010   HGB 13.0 07/06/2006 1141   HCT  34.8* 03/03/2014 0907   HCT 41.8 10/10/2013 1010   HCT 38.2 07/06/2006 1141   PLT 250 03/03/2014 0907   PLT 220 10/10/2013 1010   PLT 272 07/06/2006 1141   MCV 91.6 03/03/2014 0907   MCV 89 10/10/2013 1010   MCV 85.8 07/06/2006 1141   MCH 29.7 03/03/2014 0907   MCH 29.8 10/10/2013 1010   MCH 29.3 07/06/2006 1141   MCHC 32.5 03/03/2014 0907   MCHC 33.5 10/10/2013 1010   MCHC 34.1 07/06/2006 1141   RDW 17.5* 03/03/2014 0907   RDW 13.3 10/10/2013 1010   RDW 11.2* 07/06/2006 1141   LYMPHSABS 0.9 03/03/2014 0907   LYMPHSABS 2.0 10/10/2013 1010   LYMPHSABS 2.1 07/06/2006 1141   MONOABS 0.9 03/03/2014 0907   MONOABS 0.6 07/06/2006 1141   EOSABS 0.0 03/03/2014 0907   EOSABS 0.1 10/10/2013 1010   EOSABS 0.1 07/06/2006 1141   BASOSABS 0.0 03/03/2014 0907   BASOSABS 0.0 10/10/2013 1010   BASOSABS 0.1 07/06/2006 1141   CMP     Component Value Date/Time   NA 140 03/03/2014 0907   K 3.6 03/03/2014 0907   CL 108 03/03/2014 0907   CO2 25 03/03/2014 0907   GLUCOSE 176* 03/03/2014 0907   BUN 19 03/03/2014 0907   CREATININE 0.81 03/03/2014 0907   CALCIUM 8.8 03/03/2014 0907    PROT 5.9* 03/03/2014 0907   ALBUMIN 3.5 03/03/2014 0907   AST 24 03/03/2014 0907   ALT 17 03/03/2014 0907   ALKPHOS 55 03/03/2014 0907   BILITOT 0.5 03/03/2014 0907   GFRNONAA 68* 03/03/2014 0907   GFRAA 79* 03/03/2014 0907       ASSESSMENT and THERAPY PLAN:    Breast cancer 80 year old female with a stage I triple negative carcinoma of the left breast status post mastectomy and axillary lymph node dissection. She has completed 3 cycles of Taxotere and Cytoxan and did well. Unfortunately after her third cycle she has experienced skin toxicity mostly involving the feet but also excessive fatigue. We have delayed her treatment a week and she is not improved. I have recommended proceeding with an echocardiogram given the fact that she has significant dyspnea and in addition worsening lower extremity edema. I will see her back again in several weeks to assess her recovery. We have talked about eventually referring [facility]. I have advised her if her symptoms worsen or do not improve to call us and I will work her in sooner. A long discussion about discontinuing her chemotherapy and realistically she feels that is her best option. Based on her clinical performance status at this point I would agree.     All questions were answered. The patient knows to call the clinic with any problems, questions or concerns. We can certainly see the patient much sooner if necessary.   Molli Hazard 03/03/2014

## 2014-03-03 NOTE — Patient Instructions (Signed)
[place] at [facility]  Discharge Instructions:  Going to schedule an echo of your heart.  We are holding your chemotherapy.   Follow up with the doctor in a few weeks.  Port flushes every 6-8 weeks.  Please call the clinic if you have any questions or conerns.  _______________ ________________________________________________  Thank you for choosing [place] at [facility] to provide your oncology and hematology care.  To afford each patient quality time with our providers, please arrive at least 15 minutes before your scheduled appointment.  You need to re-schedule your appointment if you arrive 10 or more minutes late.  We strive to give you quality time with our providers, and arriving late affects you and other patients whose appointments are after yours.  Also, if you no show three or more times for appointments you may be dismissed from the clinic.  Again, thank you for choosing [place] at [place] hope is that these requests will allow you access to exceptional care and in a timely manner. _______________________________________________________________  If you have questions after your visit, please contact our office at [phone] between the hours of 8:30 a.m. and 5:00 p.m. Voicemails left after 4:30 p.m. will not be returned until the following business day. _______________________________________________________________  For prescription refill requests, have your pharmacy contact our office. _______________________________________________________________  Recommendations made by the consultant and any test results will be sent to your referring physician. _______________________________________________________________

## 2014-03-03 NOTE — Progress Notes (Signed)
Tiffany Sweeney Tolerated 500 mL of fluids today.  Discharged ambulatory

## 2014-03-03 NOTE — Assessment & Plan Note (Signed)
80 year old female with a stage I triple negative carcinoma of the left breast status post mastectomy and axillary lymph node dissection. She has completed 3 cycles of Taxotere and Cytoxan and did well. Unfortunately after her third cycle she has experienced skin toxicity mostly involving the feet but also excessive fatigue. We have delayed her treatment a week and she is not improved. I have recommended proceeding with an echocardiogram given the fact that she has significant dyspnea and in addition worsening lower extremity edema. I will see her back again in several weeks to assess her recovery. We have talked about eventually referring her in to [facility]. I have advised her if her symptoms worsen or do not improve to call us and I will work her in sooner. A long discussion about discontinuing her chemotherapy and realistically she feels that is her best option. Based on her clinical performance status at this point I would agree.

## 2014-03-04 ENCOUNTER — Telehealth (HOSPITAL_COMMUNITY): Payer: Self-pay | Admitting: *Deleted

## 2014-03-04 ENCOUNTER — Other Ambulatory Visit (HOSPITAL_COMMUNITY): Payer: Self-pay | Admitting: *Deleted

## 2014-03-04 DIAGNOSIS — M545 Low back pain: Secondary | ICD-10-CM

## 2014-03-04 DIAGNOSIS — C50912 Malignant neoplasm of unspecified site of left female breast: Secondary | ICD-10-CM

## 2014-03-04 NOTE — Telephone Encounter (Signed)
Patient said she would bring a urine sample in from home. Pt had a sterile (unopened) cup at home.

## 2014-03-05 ENCOUNTER — Encounter (HOSPITAL_COMMUNITY): Payer: Medicare Other | Attending: Hematology and Oncology

## 2014-03-05 ENCOUNTER — Ambulatory Visit (HOSPITAL_COMMUNITY): Payer: Medicare Other

## 2014-03-05 DIAGNOSIS — Z170 Estrogen receptor positive status [ER+]: Secondary | ICD-10-CM | POA: Diagnosis not present

## 2014-03-05 DIAGNOSIS — M810 Age-related osteoporosis without current pathological fracture: Secondary | ICD-10-CM | POA: Insufficient documentation

## 2014-03-05 DIAGNOSIS — C50911 Malignant neoplasm of unspecified site of right female breast: Secondary | ICD-10-CM | POA: Insufficient documentation

## 2014-03-05 DIAGNOSIS — I10 Essential (primary) hypertension: Secondary | ICD-10-CM | POA: Diagnosis not present

## 2014-03-05 DIAGNOSIS — C50912 Malignant neoplasm of unspecified site of left female breast: Secondary | ICD-10-CM | POA: Diagnosis not present

## 2014-03-05 DIAGNOSIS — M545 Low back pain: Secondary | ICD-10-CM

## 2014-03-05 LAB — URINALYSIS, ROUTINE W REFLEX MICROSCOPIC
BILIRUBIN URINE: NEGATIVE
Glucose, UA: NEGATIVE mg/dL
Hgb urine dipstick: NEGATIVE
KETONES UR: NEGATIVE mg/dL
Nitrite: NEGATIVE
PH: 7 (ref 5.0–8.0)
PROTEIN: NEGATIVE mg/dL
Specific Gravity, Urine: 1.01 (ref 1.005–1.030)
UROBILINOGEN UA: 0.2 mg/dL (ref 0.0–1.0)

## 2014-03-05 LAB — URINE MICROSCOPIC-ADD ON

## 2014-03-05 NOTE — Progress Notes (Signed)
Patient brought urine specimen to clinic for testing.

## 2014-03-06 ENCOUNTER — Other Ambulatory Visit (HOSPITAL_COMMUNITY): Payer: Self-pay | Admitting: Hematology & Oncology

## 2014-03-06 MED ORDER — CIPROFLOXACIN HCL 250 MG PO TABS: 250.0000 mg | ORAL_TABLET | Freq: Two times a day (BID) | ORAL | Status: DC

## 2014-03-10 ENCOUNTER — Encounter (HOSPITAL_BASED_OUTPATIENT_CLINIC_OR_DEPARTMENT_OTHER): Payer: Medicare Other

## 2014-03-10 DIAGNOSIS — M545 Low back pain: Secondary | ICD-10-CM

## 2014-03-10 DIAGNOSIS — C50412 Malignant neoplasm of upper-outer quadrant of left female breast: Secondary | ICD-10-CM

## 2014-03-10 DIAGNOSIS — C50912 Malignant neoplasm of unspecified site of left female breast: Secondary | ICD-10-CM

## 2014-03-10 DIAGNOSIS — Z95828 Presence of other vascular implants and grafts: Secondary | ICD-10-CM

## 2014-03-10 DIAGNOSIS — C50911 Malignant neoplasm of unspecified site of right female breast: Secondary | ICD-10-CM | POA: Diagnosis not present

## 2014-03-10 LAB — BASIC METABOLIC PANEL
ANION GAP: 7 (ref 5–15)
BUN: 14 mg/dL (ref 6–23)
CALCIUM: 8.9 mg/dL (ref 8.4–10.5)
CO2: 27 mmol/L (ref 19–32)
Chloride: 109 mmol/L (ref 96–112)
Creatinine, Ser: 0.73 mg/dL (ref 0.50–1.10)
GFR calc Af Amer: >90 mL/min (ref >90)
GFR calc non Af Amer: 80 mL/min — ABNORMAL LOW (ref >90)
Glucose, Bld: 100 mg/dL — ABNORMAL HIGH (ref 70–99)
POTASSIUM: 3.7 mmol/L (ref 3.5–5.1)
SODIUM: 143 mmol/L (ref 135–145)

## 2014-03-10 LAB — CBC
HEMATOCRIT: 35.1 % — AB (ref 36.0–46.0)
Hemoglobin: 11.6 g/dL — ABNORMAL LOW (ref 12.0–15.0)
MCH: 30.9 pg (ref 26.0–34.0)
MCHC: 33 g/dL (ref 30.0–36.0)
MCV: 93.4 fL (ref 78.0–100.0)
Platelets: 198 10*3/uL (ref 150–400)
RBC: 3.76 MIL/uL — AB (ref 3.87–5.11)
RDW: 17.1 % — AB (ref 11.5–15.5)
WBC: 6.1 10*3/uL (ref 4.0–10.5)

## 2014-03-10 MED ORDER — SODIUM CHLORIDE 0.9 % IJ SOLN
10.0000 mL | Freq: Once | INTRAMUSCULAR | Status: AC
  Administered 2014-03-10: 10 mL via INTRAVENOUS

## 2014-03-10 MED ORDER — HEPARIN SOD (PORK) LOCK FLUSH 100 UNIT/ML IV SOLN
500.0000 [IU] | Freq: Once | INTRAVENOUS | Status: AC
  Administered 2014-03-10: 500 [IU] via INTRAVENOUS

## 2014-03-10 NOTE — Progress Notes (Signed)
Tiffany Sweeney presented for Portacath access and flush. Proper placement of portacath confirmed by CXR. Portacath located right chest wall accessed with  H 20 needle. Good blood return present. Portacath flushed with 5ml NS and 500U/13ml Heparin and needle removed intact. Procedure without incident. Patient tolerated procedure well.

## 2014-03-10 NOTE — Patient Instructions (Signed)
[place] at [facility] Discharge Instructions  RECOMMENDATIONS MADE BY THE CONSULTANT AND ANY TEST RESULTS WILL BE SENT TO YOUR REFERRING PHYSICIAN.  Port flush with lab work today. Return as scheduled.  Thank you for choosing [place] at [facility] to provide your oncology and hematology care.  To afford each patient quality time with our provider, please arrive at least 15 minutes before your scheduled appointment time.    You need to re-schedule your appointment should you arrive 10 or more minutes late.  We strive to give you quality time with our providers, and arriving late affects you and other patients whose appointments are after yours.  Also, if you no show three or more times for appointments you may be dismissed from the clinic at the providers discretion.     Again, thank you for choosing [facility].  Our hope is that these requests will decrease the amount of time that you wait before being seen by our physicians.       _____________________________________________________________  Should you have questions after your visit to [facility], please contact our office at [phone] between the hours of 8:30 a.m. and 4:30 p.m.  Voicemails left after 4:30 p.m. will not be returned until the following business day.  For prescription refill requests, have your pharmacy contact our office.

## 2014-03-10 NOTE — Progress Notes (Signed)
Drawn from port per patient

## 2014-03-10 NOTE — Addendum Note (Signed)
Addended by: Berneta Levins on: 03/10/2014 04:10 PM   Modules accepted: Orders

## 2014-03-12 ENCOUNTER — Ambulatory Visit (HOSPITAL_COMMUNITY)
Admission: RE | Admit: 2014-03-12 | Discharge: 2014-03-12 | Disposition: A | Discharge status: Home / Self Care | Payer: Medicare Other | Source: Ambulatory Visit | Attending: Hematology & Oncology | Admitting: Hematology & Oncology

## 2014-03-12 DIAGNOSIS — C50919 Malignant neoplasm of unspecified site of unspecified female breast: Secondary | ICD-10-CM

## 2014-03-12 DIAGNOSIS — R0600 Dyspnea, unspecified: Secondary | ICD-10-CM | POA: Diagnosis present

## 2014-03-12 NOTE — Progress Notes (Signed)
  Echocardiogram 2D Echocardiogram has been performed.  Samuel Germany 03/12/2014, 11:01 AM

## 2014-03-25 ENCOUNTER — Encounter (HOSPITAL_BASED_OUTPATIENT_CLINIC_OR_DEPARTMENT_OTHER): Payer: Medicare Other | Admitting: Hematology & Oncology

## 2014-03-25 ENCOUNTER — Encounter (HOSPITAL_COMMUNITY): Payer: Self-pay | Admitting: Hematology & Oncology

## 2014-03-25 ENCOUNTER — Other Ambulatory Visit (HOSPITAL_COMMUNITY): Payer: Medicare Other

## 2014-03-25 ENCOUNTER — Other Ambulatory Visit (HOSPITAL_COMMUNITY): Payer: Self-pay | Admitting: Hematology & Oncology

## 2014-03-25 ENCOUNTER — Encounter (HOSPITAL_COMMUNITY): Payer: Medicare Other

## 2014-03-25 ENCOUNTER — Encounter (HOSPITAL_COMMUNITY): Payer: Self-pay | Admitting: Lab

## 2014-03-25 DIAGNOSIS — C50412 Malignant neoplasm of upper-outer quadrant of left female breast: Secondary | ICD-10-CM | POA: Diagnosis present

## 2014-03-25 DIAGNOSIS — C50919 Malignant neoplasm of unspecified site of unspecified female breast: Secondary | ICD-10-CM

## 2014-03-25 DIAGNOSIS — C50911 Malignant neoplasm of unspecified site of right female breast: Secondary | ICD-10-CM | POA: Diagnosis not present

## 2014-03-25 DIAGNOSIS — Z8041 Family history of malignant neoplasm of ovary: Secondary | ICD-10-CM

## 2014-03-25 DIAGNOSIS — R609 Edema, unspecified: Secondary | ICD-10-CM

## 2014-03-25 DIAGNOSIS — G629 Polyneuropathy, unspecified: Secondary | ICD-10-CM | POA: Diagnosis not present

## 2014-03-25 DIAGNOSIS — R0600 Dyspnea, unspecified: Secondary | ICD-10-CM

## 2014-03-25 LAB — COMPREHENSIVE METABOLIC PANEL
ALK PHOS: 64 U/L (ref 39–117)
ALT: 12 U/L (ref 0–35)
ANION GAP: 6 (ref 5–15)
AST: 18 U/L (ref 0–37)
Albumin: 3.6 g/dL (ref 3.5–5.2)
BILIRUBIN TOTAL: 0.6 mg/dL (ref 0.3–1.2)
BUN: 15 mg/dL (ref 6–23)
CHLORIDE: 108 mmol/L (ref 96–112)
CO2: 27 mmol/L (ref 19–32)
CREATININE: 0.68 mg/dL (ref 0.50–1.10)
Calcium: 8.8 mg/dL (ref 8.4–10.5)
GFR calc non Af Amer: 82 mL/min — ABNORMAL LOW (ref >90)
Glucose, Bld: 77 mg/dL (ref 70–99)
Potassium: 3.7 mmol/L (ref 3.5–5.1)
Sodium: 141 mmol/L (ref 135–145)
Total Protein: 6.4 g/dL (ref 6.0–8.3)

## 2014-03-25 LAB — CBC WITH DIFFERENTIAL/PLATELET
BASOS PCT: 1 % (ref 0–1)
Basophils Absolute: 0 10*3/uL (ref 0.0–0.1)
EOS PCT: 4 % (ref 0–5)
Eosinophils Absolute: 0.3 10*3/uL (ref 0.0–0.7)
HEMATOCRIT: 36.9 % (ref 36.0–46.0)
Hemoglobin: 11.9 g/dL — ABNORMAL LOW (ref 12.0–15.0)
Lymphocytes Relative: 21 % (ref 12–46)
Lymphs Abs: 1.4 10*3/uL (ref 0.7–4.0)
MCH: 29.4 pg (ref 26.0–34.0)
MCHC: 32.2 g/dL (ref 30.0–36.0)
MCV: 91.1 fL (ref 78.0–100.0)
Monocytes Absolute: 0.7 10*3/uL (ref 0.1–1.0)
Monocytes Relative: 11 % (ref 3–12)
Neutro Abs: 4.1 10*3/uL (ref 1.7–7.7)
Neutrophils Relative %: 63 % (ref 43–77)
PLATELETS: 231 10*3/uL (ref 150–400)
RBC: 4.05 MIL/uL (ref 3.87–5.11)
RDW: 14.2 % (ref 11.5–15.5)
WBC: 6.5 10*3/uL (ref 4.0–10.5)

## 2014-03-25 MED ORDER — SODIUM CHLORIDE 0.9 % IJ SOLN
10.0000 mL | INTRAMUSCULAR | Status: DC | PRN
  Administered 2014-03-25: 10 mL via INTRAVENOUS
  Filled 2014-03-25: qty 10

## 2014-03-25 MED ORDER — HEPARIN SOD (PORK) LOCK FLUSH 100 UNIT/ML IV SOLN
500.0000 [IU] | Freq: Once | INTRAVENOUS | Status: AC
  Administered 2014-03-25: 500 [IU] via INTRAVENOUS
  Filled 2014-03-25: qty 5

## 2014-03-25 NOTE — Progress Notes (Signed)
[name], MD [address] [place] [address]  Stage I triple negative carcinoma of the left breast s/p mastectomy and axillary LN disssection. History of Carcinoma of the R breast  CURRENT THERAPY: Observation  INTERVAL HISTORY: Tiffany Sweeney 80 y.o. female returns for follow-up of her breast cancer. She continues to improve. She states she is much better but still has problems with neuropathy. Her left foot is chronically swollen she notes that it gets worse throughout the day but improved in the morning. Her fingertips are certainly better and she is having less difficulty buttoning shirts. She states when she goes shopping she can "only do 1 store and that is irritating."  She is requesting to see a podiatrist for foot care. She has no other major complaints today. She is overall pleased with her progress back to her baseline.   MEDICAL HISTORY: Past Medical History  Diagnosis Date  . High cholesterol   . Hypertension   . Anxiety   . Cancer     breast- right  . Arthritis   . Breast cancer     has Low back pain and Breast cancer on her problem list.      Breast cancer   07/25/2013 Imaging Plain film of the lumbar spine, complete 4 view. Chronic lumbar degenerative change most prominent at L4-5. No acute abnormality and no change from prior study   10/21/2013 Initial Diagnosis Left breast cancer, core biopsy, ER negative, PR negative, HER-2/neu negative, Ki-67 of 13%, infiltrating duct cell   11/04/2013 Surgery Left modified radical mastectomy, 1.8 cm infiltrating duct cell carcinoma, ER negative, PR negative, HER-2/neu not overexpressed, 9 lymph nodes negative. Stage TI cN0 M0.   12/06/2013 Imaging Portable chest x-ray showing no pneumothorax no lung edema or consolidation Port-A-Cath tip in the SVC.   12/23/2013 -  Chemotherapy Cycle 1 of TC initiated on 12/23/2013     has No Known Allergies.  Ms. Streat does not currently have medications on  file.  SURGICAL HISTORY: Past Surgical History  Procedure Laterality Date  . Abdominal hysterectomy    . Mastectomy Right   . Cataract extraction w/phaco Left 03/11/2013    Procedure: CATARACT EXTRACTION PHACO AND INTRAOCULAR LENS PLACEMENT (IOC);  Surgeon: Tonny Branch, MD;  Location: [facility];  Service: Ophthalmology;  Laterality: Left;  CDE:7.54  . Cataract extraction w/phaco Right 04/15/2013    Procedure: CATARACT EXTRACTION PHACO AND INTRAOCULAR LENS PLACEMENT (IOC);  Surgeon: Tonny Branch, MD;  Location: [facility];  Service: Ophthalmology;  Laterality: Right;  CDE 9.38  . Mastectomy modified radical Left 11/04/2013    Procedure: LEFT MASTECTOMY MODIFIED RADICAL;  Surgeon: Jamesetta So, MD;  Location: [facility];  Service: General;  Laterality: Left;  . Portacath placement Right 12/06/2013    Procedure: INSERTION PORT-A-CATH-Right subclavian;  Surgeon: Jamesetta So, MD;  Location: [facility];  Service: General;  Laterality: Right;    SOCIAL HISTORY: History   Social History  . Marital Status: Widowed    Spouse Name: N/A  . Number of Children: N/A  . Years of Education: N/A   Occupational History  . Not on file.   Social History Main Topics  . Smoking status: Former Smoker -- 0.25 packs/day for 30 years    Types: Cigarettes    Start date: 10/10/1956    Quit date: 03/08/1985  . Smokeless tobacco: Never Used     Comment: quit 25 years ago  . Alcohol Use: No  . Drug Use: No  .  Sexual Activity: Yes    Birth Control/ Protection: Post-menopausal   Other Topics Concern  . Not on file   Social History Narrative    FAMILY HISTORY: Family History  Problem Relation Age of Onset  . Cancer Mother     died at 76, had anal cancer but died from complications of diabetes  . Diabetes Mother   . Dementia Father     Died at 46 in an accident  . Pancreatic cancer Brother     age 104  . Lung cancer Sister     age 39  . Ovarian cancer Sister     age 4    Review of Systems  Constitutional:  Positive for malaise/fatigue. Negative for fever, chills and weight loss.  HENT: Negative for congestion, hearing loss, nosebleeds, sore throat and tinnitus.   Eyes: Negative for blurred vision, double vision, pain and discharge.  Respiratory: Negative for cough, hemoptysis, sputum production, shortness of breath and wheezing.   Cardiovascular: Negative for chest pain, palpitations, claudication, leg swelling and PND.  Gastrointestinal: Negative for heartburn, nausea, vomiting, abdominal pain, diarrhea, constipation, blood in stool and melena.  Genitourinary: Negative for dysuria, urgency, frequency and hematuria.  Musculoskeletal: Negative for myalgias, joint pain and falls.       Chronic LE swelling  Skin: Negative for itching and rash.  Neurological: Positive for sensory change and weakness. Negative for dizziness, tingling, tremors, speech change, focal weakness, seizures, loss of consciousness and headaches.  Endo/Heme/Allergies: Does not bruise/bleed easily.  Psychiatric/Behavioral: Negative for depression, suicidal ideas, memory loss and substance abuse. The patient is not nervous/anxious and does not have insomnia.     PHYSICAL EXAMINATION  ECOG PERFORMANCE STATUS: 1 - Symptomatic but completely ambulatory  There were no vitals filed for this visit.  Physical Exam  Constitutional: She is oriented to person, place, and time and well-developed, well-nourished, and in no distress.  HENT:  Head: Normocephalic and atraumatic.  Nose: Nose normal.  Mouth/Throat: Oropharynx is clear and moist. No oropharyngeal exudate.  Alopecia, wearing a scarf   Eyes: Conjunctivae and EOM are normal. Pupils are equal, round, and reactive to light. Right eye exhibits no discharge. Left eye exhibits no discharge. No scleral icterus.  Neck: Normal range of motion. Neck supple. No tracheal deviation present. No thyromegaly present.  Cardiovascular: Normal rate, regular rhythm and normal heart sounds.   Exam reveals no gallop and no friction rub.   No murmur heard. Pulmonary/Chest: Effort normal and breath sounds normal. She has no wheezes. She has no rales.  Abdominal: Soft. Bowel sounds are normal. She exhibits no distension and no mass. There is no tenderness. There is no rebound and no guarding.  Musculoskeletal: Normal range of motion. She exhibits no edema.  Left lower extremity ankle swelling is chronic  Lymphadenopathy:    She has no cervical adenopathy.  Neurological: She is alert and oriented to person, place, and time. She has normal reflexes. No cranial nerve deficit. Coordination normal.  Skin: Skin is warm and dry. No rash noted.  Psychiatric: Mood, memory, affect and judgment normal.  Nursing note and vitals reviewed.   LABORATORY DATA:  CBC    Component Value Date/Time   WBC 6.1 03/10/2014 1204   WBC 6.7 10/10/2013 1010   WBC 7.9 07/06/2006 1141   RBC 3.76* 03/10/2014 1204   RBC 4.70 10/10/2013 1010   RBC 4.45 07/06/2006 1141   HGB 11.6* 03/10/2014 1204   HGB 14.0 10/10/2013 1010   HGB 13.0 07/06/2006 1141  HCT 35.1* 03/10/2014 1204   HCT 41.8 10/10/2013 1010   HCT 38.2 07/06/2006 1141   PLT 198 03/10/2014 1204   PLT 220 10/10/2013 1010   PLT 272 07/06/2006 1141   MCV 93.4 03/10/2014 1204   MCV 89 10/10/2013 1010   MCV 85.8 07/06/2006 1141   MCH 30.9 03/10/2014 1204   MCH 29.8 10/10/2013 1010   MCH 29.3 07/06/2006 1141   MCHC 33.0 03/10/2014 1204   MCHC 33.5 10/10/2013 1010   MCHC 34.1 07/06/2006 1141   RDW 17.1* 03/10/2014 1204   RDW 13.3 10/10/2013 1010   RDW 11.2* 07/06/2006 1141   LYMPHSABS 0.9 03/03/2014 0907   LYMPHSABS 2.0 10/10/2013 1010   LYMPHSABS 2.1 07/06/2006 1141   MONOABS 0.9 03/03/2014 0907   MONOABS 0.6 07/06/2006 1141   EOSABS 0.0 03/03/2014 0907   EOSABS 0.1 10/10/2013 1010   EOSABS 0.1 07/06/2006 1141   BASOSABS 0.0 03/03/2014 0907   BASOSABS 0.0 10/10/2013 1010   BASOSABS 0.1 07/06/2006 1141   CMP     Component Value  Date/Time   NA 143 03/10/2014 1204   K 3.7 03/10/2014 1204   CL 109 03/10/2014 1204   CO2 27 03/10/2014 1204   GLUCOSE 100* 03/10/2014 1204   BUN 14 03/10/2014 1204   CREATININE 0.73 03/10/2014 1204   CALCIUM 8.9 03/10/2014 1204   PROT 5.9* 03/03/2014 0907   ALBUMIN 3.5 03/03/2014 0907   AST 24 03/03/2014 0907   ALT 17 03/03/2014 0907   ALKPHOS 55 03/03/2014 0907   BILITOT 0.5 03/03/2014 0907   GFRNONAA 80* 03/10/2014 1204   GFRAA >90 03/10/2014 1204    ASSESSMENT and THERAPY PLAN:   Stage I triple negative carcinoma of the left breast Mastectomy 3 cycles of Taxotere and Cytoxan, discontinued prior to cycle 4 secondary to excessive fatigue, hand/foot syndrome and worsening neuropathy History of breast cancer Family history of ovarian cancer  Overall she is making a slow and steady recovery. We discussed a referral to [facility] for [facility]. She is very interested in attending. We again readdressed genetic counseling and she is willing to travel to [facility] for this. I will set her up tentatively for 3 month follow-up. She has been advised to call in the interim if she has any concerns. We will move forward with observation in accordance with NCCN guidelines.  All questions were answered. The patient knows to call the clinic with any problems, questions or concerns. We can certainly see the patient much sooner if necessary.   [place],Shannon KristenMD 03/25/2014

## 2014-03-25 NOTE — Progress Notes (Signed)
Harriet Masson presented for labwork. Labs per MD order drawn via Portacath located in the left chest wall accessed with  H 20 needle. Good blood return present. Procedure without incident.  Portacath flushed with 47ml NS and 500U/53ml Heparin per protocol and needle removed intact. Patient tolerated procedure well.

## 2014-03-25 NOTE — Progress Notes (Signed)
Referral made to Genetic.  Faxed on 3/22

## 2014-03-25 NOTE — Progress Notes (Signed)
Please see doctors encounter for more infornation

## 2014-03-25 NOTE — Patient Instructions (Addendum)
[place] at [facility] Discharge Instructions  RECOMMENDATIONS MADE BY THE CONSULTANT AND ANY TEST RESULTS WILL BE SENT TO YOUR REFERRING PHYSICIAN.  Exam and discussion by Dr. Whitney Muse We will do a Star Assessment and will make a referral to Rehab to help with your mobility Report any new lumps, bone pain, shortness of breath or other symptoms.  Port flushes every 6 weeks and office visit in 3 months. Will also make a referral for genetics counseling in [place] in approximately 6 weeks.  They will contact you with an appointment.    Thank you for choosing [place] at [facility] to provide your oncology and hematology care.  To afford each patient quality time with our provider, please arrive at least 15 minutes before your scheduled appointment time.    You need to re-schedule your appointment should you arrive 10 or more minutes late.  We strive to give you quality time with our providers, and arriving late affects you and other patients whose appointments are after yours.  Also, if you no show three or more times for appointments you may be dismissed from the clinic at the providers discretion.     Again, thank you for choosing [facility].  Our hope is that these requests will decrease the amount of time that you wait before being seen by our physicians.       _____________________________________________________________  Should you have questions after your visit to [facility], please contact our office at [phone] between the hours of 8:30 a.m. and 4:30 p.m.  Voicemails left after 4:30 p.m. will not be returned until the following business day.  For prescription refill requests, have your pharmacy contact our office.

## 2014-04-11 ENCOUNTER — Ambulatory Visit (HOSPITAL_COMMUNITY): Payer: Medicare Other | Attending: Hematology & Oncology | Admitting: Physical Therapy

## 2014-04-11 DIAGNOSIS — Z9221 Personal history of antineoplastic chemotherapy: Secondary | ICD-10-CM | POA: Diagnosis not present

## 2014-04-11 DIAGNOSIS — C50911 Malignant neoplasm of unspecified site of right female breast: Secondary | ICD-10-CM | POA: Diagnosis not present

## 2014-04-11 DIAGNOSIS — M545 Low back pain, unspecified: Secondary | ICD-10-CM

## 2014-04-11 DIAGNOSIS — M25659 Stiffness of unspecified hip, not elsewhere classified: Secondary | ICD-10-CM | POA: Diagnosis not present

## 2014-04-11 DIAGNOSIS — R262 Difficulty in walking, not elsewhere classified: Secondary | ICD-10-CM | POA: Diagnosis not present

## 2014-04-11 DIAGNOSIS — R5383 Other fatigue: Secondary | ICD-10-CM | POA: Diagnosis not present

## 2014-04-11 NOTE — Patient Instructions (Signed)
Knee Extension (Sitting)   Place _0___ pound weight on right  ankle and straighten knee fully, lower slowly. Repeat _10___ times per set. Do ___1_ sets per session. Do ___3_ sessions per day.  http://orth.exer.us/732   Copyright  VHI. All rights reserved.  Toe / Heel Raise (Sitting)   Sitting, raise heels, then rock back on heels and raise toes. Repeat _15___ times.  Copyright  VHI. All rights reserved.  Bridging   Slowly raise buttocks from floor, keeping stomach tight. Repeat _10-15___ times per set. Do ___1_ sets per session. Do _2___ sessions per day.  http://orth.exer.us/1096   Copyright  VHI. All rights reserved.  Bent Leg Lift (Hook-Lying)   Tighten stomach and slowly raise right leg __4__ inches from floor. Keep trunk rigid. Hold ___3_ seconds. Repeat ___10-15_ times per set. Do __1__ sets per session. Do ____2 sessions per day.  http://orth.exer.us/1090   Copyright  VHI. All rights reserved.  Knee-to-Chest Stretch: Unilateral   With hand behind right knee, pull knee in to chest until a comfortable stretch is felt in lower back and buttocks. Keep back relaxed. Hold _30___ seconds. Repeat _3___ times per set. Do __1__ sets per session. Do ___2_ sessions per day.  http://orth.exer.us/126   Copyright  VHI. All rights reserved.

## 2014-04-11 NOTE — Therapy (Signed)
[place], [place], [address] Phone: [phone]   Fax:  [phone]  Physical Therapy Evaluation  Patient Details  Name: Princesa Willig MRN: [id number] Date of Birth: Apr 06, 1934 Referring Provider:  Patrici Ranks, MD  Encounter Date: 04/11/2014      PT End of Session - 04/11/14 1700    Visit Number 1   Number of Visits 12   Date for PT Re-Evaluation 05/11/14   Authorization Type medicare    PT Start Time 0800   PT Stop Time 0850   PT Time Calculation (min) 50 min   Activity Tolerance Patient tolerated treatment well      Past Medical History  Diagnosis Date  . High cholesterol   . Hypertension   . Anxiety   . Cancer     breast- right  . Arthritis   . Breast cancer     Past Surgical History  Procedure Laterality Date  . Abdominal hysterectomy    . Mastectomy Right   . Cataract extraction w/phaco Left 03/11/2013    Procedure: CATARACT EXTRACTION PHACO AND INTRAOCULAR LENS PLACEMENT (IOC);  Surgeon: Tonny Branch, MD;  Location: [facility];  Service: Ophthalmology;  Laterality: Left;  CDE:7.54  . Cataract extraction w/phaco Right 04/15/2013    Procedure: CATARACT EXTRACTION PHACO AND INTRAOCULAR LENS PLACEMENT (IOC);  Surgeon: Tonny Branch, MD;  Location: [facility];  Service: Ophthalmology;  Laterality: Right;  CDE 9.38  . Mastectomy modified radical Left 11/04/2013    Procedure: LEFT MASTECTOMY MODIFIED RADICAL;  Surgeon: Jamesetta So, MD;  Location: [facility];  Service: General;  Laterality: Left;  . Portacath placement Right 12/06/2013    Procedure: INSERTION PORT-A-CATH-Right subclavian;  Surgeon: Jamesetta So, MD;  Location: [facility];  Service: General;  Laterality: Right;    There were no vitals filed for this visit.  Visit Diagnosis:  Fatigue due to treatment  Midline low back pain without sciatica  Stiffness of hip joint, unspecified laterality      Subjective Assessment - 04/11/14 0811    Subjective Ms. Ghosh was diagnosed with breast cancer last October.  She was only able to recieve 3 of her 4 chemo treatments secondary to skin toxicity and fatigue.  She states that she has tingling in her hand bilaterally as well as the top of her feet.  She is tired all the time and is not able to get back to her previous level of functioning therefore she has been referred to therapy.  She has had back pain before but her pain has increased significantly since the treatment.  She states she has a difficult time getting her stockings on.     Pertinent History Breast cancer; Low back pain   How long can you sit comfortably? no problem   How long can you stand comfortably? standing is not a probelm    How long can you walk comfortably? not walking at all    Patient Stated Goals increased energy    Currently in Pain? No/denies  backis more stiffness             [facility] PT Assessment - 04/11/14 0818    Assessment   Medical Diagnosis fatigue/difficulty walking   Onset Date 12/03/13   Next MD Visit 06/05/2014   Prior Therapy none   Precautions   Precautions None   Restrictions   Weight Bearing Restrictions No   Balance Screen   Has the patient fallen in the past 6 months  No   Has the patient had a decrease in activity level because of a fear of falling?  Yes   Is the patient reluctant to leave their home because of a fear of falling?  Yes   Home Environment   Living Enviornment Private residence   Type of [place] Access Stairs to enter   Entrance Stairs-Number of Steps 5   Home Layout Laundry or work area in basement   Prior Function   Vocation Retired   Leisure reading, cooking, gardening    Cognition   Overall Cognitive Status Within Functional Limits for tasks assessed   Observation/Other Assessments   Other Surveys  --  FACIT-F 112/150; VAS fatige 7; pain 0; distress 2   Functional Tests   Functional tests Sit to Stand   Sit to Stand   Comments unable to come to  stand without UE    AROM   Lumbar Flexion decreased 50%   Lumbar Extension decreased 80%   Lumbar - Right Side Bend decresased 20%   Lumbar - Left Side Bend decreased 20%   Lumbar - Right Rotation decreased 20%   Lumbar - Left Rotation decreased 20%   Strength   Right Hip Flexion 2-/5   Right Hip Extension 3-/5   Right Hip ABduction 4+/5   Left Hip Flexion 4+/5   Left Hip Extension 3-/5   Left Hip ABduction 4+/5   Right Knee Flexion 5/5   Right Knee Extension 3+/5   Left Knee Flexion 5/5   Left Knee Extension 4/5   Right Ankle Dorsiflexion 3+/5   Left Ankle Dorsiflexion 3+/5   Bed Mobility   Bed Mobility Rolling Right;Rolling Left   Rolling Right 6: Modified independent (Device/Increase time)   Rolling Left 6: Modified independent (Device/Increase time)   Standardized Balance Assessment   Standardized Balance Assessment Timed Up and Go Test   Timed Up and Go Test   TUG Normal TUG   Normal TUG (seconds) 19                   [facility] Adult PT Treatment/Exercise - 04/11/14 0818    Exercises   Exercises Lumbar;Knee/Hip   Lumbar Exercises: Seated   Long Arc Quad on Chair Both;10 reps   Lumbar Exercises: Supine   Bent Knee Raise 10 reps   Bridge 10 reps                PT Education - 04/11/14 1658    Education provided Yes   Education Details HEP/ Star booklet given and gone over; Pt given sheet on the importance of walking    Person(s) Educated Patient   Methods Explanation;Handout   Comprehension Verbalized understanding;Returned demonstration          PT Short Term Goals - 04/11/14 1706    PT SHORT TERM GOAL #1   Title Pt to be I in HEP   Time 1   Status New   PT SHORT TERM GOAL #2   Title Pt to have stated that she has started a walking program at home   Time 2   Period Weeks           PT Long Term Goals - 04/11/14 1707    PT LONG TERM GOAL #1   Title I in advance HEP   Time 4   Period Weeks   PT LONG TERM GOAL #2   Title Pt to  be walking for at lease 15 minutes a day  3x a week   Time 4   Period Weeks   PT LONG TERM GOAL #3   Title Pt to be able to come sit to stand without use of UE assist   Time 4   Period Weeks   PT LONG TERM GOAL #4   Title Pt to be able to SLS for 10 seconds for reduce risk of falling   Time 4   Period Weeks   PT LONG TERM GOAL #5   Title Pt TUG to be 12 or less to demonstrate decreased risk of falling    Time 4   Period Weeks   Additional Long Term Goals   Additional Long Term Goals Yes   PT LONG TERM GOAL #6   Title FACIT-F to have improved by 25 points   Time 4   Period Weeks               Plan - 04/21/14 1701    Clinical Impression Statement Ms. Defrancesco is a 80 yo who was diagnosed with breast cancer.  She has finished her chemotherapy and has not been able to return to her normal functioning self due to fatigue and weakness.  She has been referred to physical therapy.  Evaluation demonstrates decreased srength, decreased power,  and decreaed balance.  She will benefit from skilled PT to address these issues and return her to her maximal functional ablity.    Pt will benefit from skilled therapeutic intervention in order to improve on the following deficits Decreased activity tolerance;Decreased balance;Decreased endurance;Decreased strength;Difficulty walking;Decreased range of motion   Rehab Potential Good   PT Frequency 3x / week   PT Duration 4 weeks   PT Treatment/Interventions Therapeutic activities;Therapeutic exercise;Balance training;Functional mobility training;Patient/family education;Manual techniques   PT Next Visit Plan begin piriformis, knee to chest and active hamstring stretching, sit to stand, functional squat , heel raises, SLS, cybex back extension, leg press;  Progress to forward/lateral step ups, lunges, wall squats    PT Home Exercise Plan given          G-Codes - 2014-04-21 1710    Functional Limitation Mobility: Walking and moving around    Mobility: Walking and Moving Around Current Status [phone]) At least 60 percent but less than 80 percent impaired, limited or restricted   Mobility: Walking and Moving Around Goal Status [phone]) At least 20 percent but less than 40 percent impaired, limited or restricted       Problem List Patient Active Problem List   Diagnosis Date Noted  . Breast cancer 11/04/2013  . Low back pain 11/12/2012  Rayetta Humphrey, PT CLT [phone] 2014-04-21, 5:11 PM  [place] [address] [place], [place], [address] Phone: [phone]   Fax:  [phone]

## 2014-04-14 ENCOUNTER — Ambulatory Visit (HOSPITAL_BASED_OUTPATIENT_CLINIC_OR_DEPARTMENT_OTHER): Payer: Medicare Other | Admitting: Genetic Counselor

## 2014-04-14 ENCOUNTER — Ambulatory Visit (HOSPITAL_BASED_OUTPATIENT_CLINIC_OR_DEPARTMENT_OTHER): Payer: Medicare Other

## 2014-04-14 ENCOUNTER — Encounter: Payer: Self-pay | Admitting: Genetic Counselor

## 2014-04-14 ENCOUNTER — Encounter (HOSPITAL_COMMUNITY): Payer: Medicare Other

## 2014-04-14 ENCOUNTER — Other Ambulatory Visit: Payer: Medicare Other

## 2014-04-14 VITALS — BP 179/79 | HR 57 | Temp 98.0°F

## 2014-04-14 DIAGNOSIS — Z853 Personal history of malignant neoplasm of breast: Secondary | ICD-10-CM

## 2014-04-14 DIAGNOSIS — C50911 Malignant neoplasm of unspecified site of right female breast: Secondary | ICD-10-CM

## 2014-04-14 DIAGNOSIS — Z95828 Presence of other vascular implants and grafts: Secondary | ICD-10-CM

## 2014-04-14 DIAGNOSIS — Z315 Encounter for genetic counseling: Secondary | ICD-10-CM | POA: Diagnosis not present

## 2014-04-14 DIAGNOSIS — Z801 Family history of malignant neoplasm of trachea, bronchus and lung: Secondary | ICD-10-CM

## 2014-04-14 DIAGNOSIS — C50912 Malignant neoplasm of unspecified site of left female breast: Secondary | ICD-10-CM

## 2014-04-14 DIAGNOSIS — C50412 Malignant neoplasm of upper-outer quadrant of left female breast: Secondary | ICD-10-CM | POA: Diagnosis present

## 2014-04-14 DIAGNOSIS — Z800 Family history of malignant neoplasm of digestive organs: Secondary | ICD-10-CM | POA: Diagnosis not present

## 2014-04-14 DIAGNOSIS — Z8041 Family history of malignant neoplasm of ovary: Secondary | ICD-10-CM | POA: Diagnosis not present

## 2014-04-14 MED ORDER — SODIUM CHLORIDE 0.9 % IJ SOLN
10.0000 mL | INTRAMUSCULAR | Status: DC | PRN
  Administered 2014-04-14: 10 mL via INTRAVENOUS
  Filled 2014-04-14: qty 10

## 2014-04-14 MED ORDER — HEPARIN SOD (PORK) LOCK FLUSH 100 UNIT/ML IV SOLN
500.0000 [IU] | Freq: Once | INTRAVENOUS | Status: AC
  Administered 2014-04-14: 500 [IU] via INTRAVENOUS
  Filled 2014-04-14: qty 5

## 2014-04-14 NOTE — Patient Instructions (Signed)

## 2014-04-14 NOTE — Progress Notes (Signed)
REFERRING PROVIDER: Lemmie Evens, MD [place], [place] [id number]   Ancil Linsey, MD  PRIMARY PROVIDER:  [name], MD  PRIMARY REASON FOR VISIT:  1. Breast cancer, left breast   2. Breast CA, right   3. Family history of ovarian cancer   4. Family history of pancreatic cancer   5. Family history of colon cancer      HISTORY OF PRESENT ILLNESS:   Tiffany Sweeney, a 80 y.o. female, was seen for a [facility] cancer genetics consultation at the request of Dr. Whitney Muse due to a personal and family history of cancer.  Tiffany Sweeney presents to clinic today to discuss the possibility of a hereditary predisposition to cancer, genetic testing, and to further clarify her future cancer risks, as well as potential cancer risks for family members.   In 1994, at the age of 51, Tiffany Sweeney was diagnosed with invasive ductal carcinoma of the breast. This was treated with Mastectomy and tamoxifen.  In 2015,at the age of 67, Tiffany Sweeney was diagnosed with breast cancer.  This was treated with mastectomy, chemotherapy, but is not anticipated to need radiation.  This later cancer is triple negative.   CANCER HISTORY:    Breast cancer   07/25/2013 Imaging Plain film of the lumbar spine, complete 4 view. Chronic lumbar degenerative change most prominent at L4-5. No acute abnormality and no change from prior study   10/21/2013 Initial Diagnosis Left breast cancer, core biopsy, ER negative, PR negative, HER-2/neu negative, Ki-67 of 13%, infiltrating duct cell   11/04/2013 Surgery Left modified radical mastectomy, 1.8 cm infiltrating duct cell carcinoma, ER negative, PR negative, HER-2/neu not overexpressed, 9 lymph nodes negative. Stage TI cN0 M0.   12/06/2013 Imaging Portable chest x-ray showing no pneumothorax no lung edema or consolidation Port-A-Cath tip in the SVC.   12/23/2013 -  Chemotherapy Cycle 1 of TC initiated on 12/23/2013     HORMONAL RISK FACTORS:  Menarche was at age  34-13.  First live birth at age 61.  OCP use for approximately 0 years.  Ovaries intact: no.  Hysterectomy: yes.  Menopausal status: postmenopausal.  HRT use: 10-15 years. Colonoscopy: yes; approximatley 4 polyps found.  Seen every 5 years.. Mammogram within the last year: yes. Number of breast biopsies: 2. Up to date with pelvic exams:  No longer doing them. Any excessive radiation exposure in the past:  n/a  Past Medical History  Diagnosis Date  . High cholesterol   . Hypertension   . Anxiety   . Cancer     breast- right  . Arthritis   . Breast cancer   . Family history of ovarian cancer   . Family history of colon cancer   . Family history of pancreatic cancer     Past Surgical History  Procedure Laterality Date  . Abdominal hysterectomy    . Mastectomy Right   . Cataract extraction w/phaco Left 03/11/2013    Procedure: CATARACT EXTRACTION PHACO AND INTRAOCULAR LENS PLACEMENT (IOC);  Surgeon: Tonny Branch, MD;  Location: [facility];  Service: Ophthalmology;  Laterality: Left;  CDE:7.54  . Cataract extraction w/phaco Right 04/15/2013    Procedure: CATARACT EXTRACTION PHACO AND INTRAOCULAR LENS PLACEMENT (IOC);  Surgeon: Tonny Branch, MD;  Location: [facility];  Service: Ophthalmology;  Laterality: Right;  CDE 9.38  . Mastectomy modified radical Left 11/04/2013    Procedure: LEFT MASTECTOMY MODIFIED RADICAL;  Surgeon: Jamesetta So, MD;  Location: [facility];  Service: General;  Laterality: Left;  .  Portacath placement Right 12/06/2013    Procedure: INSERTION PORT-A-CATH-Right subclavian;  Surgeon: Jamesetta So, MD;  Location: [facility];  Service: General;  Laterality: Right;    History   Social History  . Marital Status: Widowed    Spouse Name: N/A  . Number of Children: 3  . Years of Education: N/A   Social History Main Topics  . Smoking status: Former Smoker -- 0.25 packs/day for 30 years    Types: Cigarettes    Start date: 10/10/1956    Quit date: 03/08/1985  . Smokeless  tobacco: Never Used     Comment: quit 25 years ago  . Alcohol Use: No  . Drug Use: No  . Sexual Activity: Yes    Birth Control/ Protection: Post-menopausal   Other Topics Concern  . None   Social History Narrative     FAMILY HISTORY:  We obtained a detailed, 4-generation family history.  Significant diagnoses are listed below: Family History  Problem Relation Age of Onset  . Diabetes Mother   . Rectal cancer Mother 25    died from complications of DM  . Dementia Father     Died at 57 in an accident  . Pancreatic cancer Brother 54  . Lung cancer Sister 55    "small oat cell"  . Ovarian cancer Sister 50  . Cancer Cousin     maternal 1st cousin with either ovarian or uterine  . Colon cancer Cousin     paternal first cousin dx at unknown age   Tiffany Sweeney had two daughters and one son.  One daughter has passed away.  She also had two brothers and three sisters.  One sister had ovarian cancer at 38, one sister had lung cancer at 49 and a brother had pancreatic cancer at 45.  Her mother had colon cancer at 60.  There is reportedly a maternal first cousin who had either uterine or ovarian cancer.  No other family history is known, as Tiffany Sweeney's family was in [place] and she grew up in [place] and never met her extended family. Patient's maternal ancestors are of [place] descent, and paternal ancestors are of [place] descent. There is no reported Ashkenazi Jewish ancestry. There isno known consanguinity.  GENETIC COUNSELING ASSESSMENT: Tiffany Sweeney is a 80 y.o. female with a personal and family history which somewhat suggestive of a hereditary breast and ovarian cancer, and also Lynch syndrome, and predisposition to cancer. We, therefore, discussed and recommended the following at today's visit.   DISCUSSION: We reviewed the characteristics, features and inheritance patterns of hereditary cancer syndromes. We reviewed that the cancer in her family is suggestive of either a hereditary  breast/ovairan cancer syndrome or possibly Lynch syndrome.  We also discussed genetic testing, including the appropriate family members to test, the process of testing, insurance coverage and turn-around-time for results. We discussed the implications of a negative, positive and/or variant of uncertain significant result. We recommended Tiffany Sweeney pursue genetic testing for the Breast/Ovarian cancer gene panel. The Breast/Ovarian gene panel offered by [facility] includes sequencing and rearrangement analysis for the following 21 genes:  ATM, BARD1, BRCA1, BRCA2, BRIP1, CDH1, CHEK2, EPCAM, FANCC, MLH1, MSH2, MSH6, NBN, PALB2, PMS2, PTEN, RAD51C, RAD51D, STK11, TP53, and XRCC2.     PLAN: After considering the risks, benefits, and limitations, Tiffany Sweeney  provided informed consent to pursue genetic testing and the blood sample was sent to [facility] for analysis of the Breast/Ovarian cancer gene panel. Results should be available within approximately  2-3 weeks' time, at which point they will be disclosed by telephone to Tiffany Sweeney, as will any additional recommendations warranted by these results. Tiffany Sweeney will receive a summary of her genetic counseling visit and a copy of her results once available. This information will also be available in Epic. We encouraged Tiffany Sweeney to remain in contact with cancer genetics annually so that we can continuously update the family history and inform her of any changes in cancer genetics and testing that may be of benefit for her family. Tiffany Sweeney questions were answered to her satisfaction today. Our contact information was provided should additional questions or concerns arise.  Lastly, we encouraged Tiffany Sweeney to remain in contact with cancer genetics annually so that we can continuously update the family history and inform her of any changes in cancer genetics and testing that may be of benefit for this family.   Ms.  Sweeney questions were answered to  her satisfaction today. Our contact information was provided should additional questions or concerns arise. Thank you for the referral and allowing [place] to share in the care of your patient.   Enma Maeda P. Florene [place], [place], [facility] Certified Genetic Counselor [email or web address] phone: [phone]  The patient was seen for a total of 45 minutes in face-to-face genetic counseling.  This patient was discussed with Drs. Magrinat, Lindi Adie and/or Burr Medico who agrees with the above.    _______________________________________________________________________ For Office Staff:  Number of people involved in session: 2 Was an Intern/ student involved with case: no

## 2014-04-15 ENCOUNTER — Encounter (HOSPITAL_COMMUNITY): Payer: Self-pay | Admitting: Hematology & Oncology

## 2014-04-16 ENCOUNTER — Ambulatory Visit (HOSPITAL_COMMUNITY): Payer: Medicare Other | Admitting: Physical Therapy

## 2014-04-16 DIAGNOSIS — M545 Low back pain, unspecified: Secondary | ICD-10-CM

## 2014-04-16 DIAGNOSIS — R5383 Other fatigue: Secondary | ICD-10-CM

## 2014-04-16 DIAGNOSIS — M25659 Stiffness of unspecified hip, not elsewhere classified: Secondary | ICD-10-CM

## 2014-04-16 NOTE — Therapy (Signed)
[place], [place], [address] Phone: [phone]   Fax:  [phone]  Physical Therapy Treatment  Patient Details  Name: Makaylia Hewett MRN: [id number] Date of Birth: 18-May-1934 Referring Provider:  Patrici Ranks, MD  Encounter Date: 04/16/2014      PT End of Session - 04/16/14 0835    Visit Number 2   Number of Visits 12   Date for PT Re-Evaluation 05/11/14   Authorization Type medicare    PT Start Time 0800   PT Stop Time 0843   PT Time Calculation (min) 43 min      Past Medical History  Diagnosis Date  . High cholesterol   . Hypertension   . Anxiety   . Cancer     breast- right  . Arthritis   . Breast cancer   . Family history of ovarian cancer   . Family history of colon cancer   . Family history of pancreatic cancer     Past Surgical History  Procedure Laterality Date  . Abdominal hysterectomy    . Mastectomy Right   . Cataract extraction w/phaco Left 03/11/2013    Procedure: CATARACT EXTRACTION PHACO AND INTRAOCULAR LENS PLACEMENT (IOC);  Surgeon: Tonny Branch, MD;  Location: [facility];  Service: Ophthalmology;  Laterality: Left;  CDE:7.54  . Cataract extraction w/phaco Right 04/15/2013    Procedure: CATARACT EXTRACTION PHACO AND INTRAOCULAR LENS PLACEMENT (IOC);  Surgeon: Tonny Branch, MD;  Location: [facility];  Service: Ophthalmology;  Laterality: Right;  CDE 9.38  . Mastectomy modified radical Left 11/04/2013    Procedure: LEFT MASTECTOMY MODIFIED RADICAL;  Surgeon: Jamesetta So, MD;  Location: [facility];  Service: General;  Laterality: Left;  . Portacath placement Right 12/06/2013    Procedure: INSERTION PORT-A-CATH-Right subclavian;  Surgeon: Jamesetta So, MD;  Location: [facility];  Service: General;  Laterality: Right;    There were no vitals filed for this visit.  Visit Diagnosis:  Fatigue due to treatment  Midline low back pain without sciatica  Stiffness of hip joint, unspecified  laterality  Bilateral low back pain without sciatica      Subjective Assessment - 04/16/14 0802    Subjective Ms. Steinke states that she has been walking about 4000 steps a day.  She is doiing her HEP although they are difficulty.   Currently in Pain? No/denies               [facility] Adult PT Treatment/Exercise - 04/16/14 0001    Lumbar Exercises: Stretches   Active Hamstring Stretch 3 reps;30 seconds   Single Knee to Chest Stretch 3 reps;30 seconds   Piriformis Stretch 3 reps;30 seconds   Piriformis Stretch Limitations supine taking heel up opposite leg    Lumbar Exercises: Aerobic   Stationary Bike nustep hills 3 Level 2 x 8'   Lumbar Exercises: Machines for Strengthening   Cybex Lumbar Extension 2 Pl x 10   Leg Press 4Pl x 10    Other Lumbar Machine Exercise Chest press 2 pl x 10    Lumbar Exercises: Standing   Heel Raises 10 reps   Functional Squats 10 reps   Lumbar Exercises: Seated   Long Arc Quad on Chair Both;10 reps   LAQ on Chair Weights (lbs) 3#    Sit to Stand 10 reps                PT Education - 04/16/14 [phone]    Education provided Yes  Education Details on lymphedema and breast cancer    Person(s) Educated Patient   Methods Explanation;Handout   Comprehension Verbalized understanding          PT Short Term Goals - 04/11/14 1706    PT SHORT TERM GOAL #1   Title Pt to be I in HEP   Time 1   Status New   PT SHORT TERM GOAL #2   Title Pt to have stated that she has started a walking program at home   Time 2   Period Weeks           PT Long Term Goals - 04/11/14 1707    PT LONG TERM GOAL #1   Title I in advance HEP   Time 4   Period Weeks   PT LONG TERM GOAL #2   Title Pt to be walking for at lease 15 minutes a day 3x a week   Time 4   Period Weeks   PT LONG TERM GOAL #3   Title Pt to be able to come sit to stand without use of UE assist   Time 4   Period Weeks   PT LONG TERM GOAL #4   Title Pt to be able to SLS for 10  seconds for reduce risk of falling   Time 4   Period Weeks   PT LONG TERM GOAL #5   Title Pt TUG to be 12 or less to demonstrate decreased risk of falling    Time 4   Period Weeks   Additional Long Term Goals   Additional Long Term Goals Yes   PT LONG TERM GOAL #6   Title FACIT-F to have improved by 25 points   Time 4   Period Weeks               Plan - 04/16/14 5697    Clinical Impression Statement Noted decreased balance with exercises.  Pt is very tight  and weak in her hip mm.  Pt needed therapist facilitation on all exercisees.    PT Next Visit Plan begin SLS next treatment as time ran out.  As able progress to step ups, lunges and wall squats then progress to standing t-band postural exercises         Problem List Patient Active Problem List   Diagnosis Date Noted  . Family history of ovarian cancer   . Family history of colon cancer   . Family history of pancreatic cancer   . Breast cancer 11/04/2013  . Low back pain 11/12/2012   Rayetta Humphrey, PT CLT [phone] 04/16/2014, 9:39 AM  [place] [address] [place], [place], [address] Phone: [phone]   Fax:  [phone]

## 2014-04-18 ENCOUNTER — Ambulatory Visit (HOSPITAL_COMMUNITY): Payer: Medicare Other | Admitting: Physical Therapy

## 2014-04-18 DIAGNOSIS — M545 Low back pain, unspecified: Secondary | ICD-10-CM

## 2014-04-18 DIAGNOSIS — R5383 Other fatigue: Secondary | ICD-10-CM

## 2014-04-18 DIAGNOSIS — M25659 Stiffness of unspecified hip, not elsewhere classified: Secondary | ICD-10-CM

## 2014-04-18 NOTE — Therapy (Signed)
[place], [place], [address] Phone: [phone]   Fax:  [phone]  Physical Therapy Treatment  Patient Details  Name: Tiffany Sweeney MRN: [id number] Date of Birth: 04/06/1934 Referring Provider:  Patrici Ranks, MD  Encounter Date: 04/18/2014      PT End of Session - 04/18/14 0842    Visit Number 3   Number of Visits 12   Date for PT Re-Evaluation 05/11/14   Authorization Type medicare    PT Start Time 0800   PT Stop Time 0845   PT Time Calculation (min) 45 min   Activity Tolerance Patient tolerated treatment well   Behavior During Therapy [facility] for tasks assessed/performed      Past Medical History  Diagnosis Date  . High cholesterol   . Hypertension   . Anxiety   . Cancer     breast- right  . Arthritis   . Breast cancer   . Family history of ovarian cancer   . Family history of colon cancer   . Family history of pancreatic cancer     Past Surgical History  Procedure Laterality Date  . Abdominal hysterectomy    . Mastectomy Right   . Cataract extraction w/phaco Left 03/11/2013    Procedure: CATARACT EXTRACTION PHACO AND INTRAOCULAR LENS PLACEMENT (IOC);  Surgeon: Tonny Branch, MD;  Location: [facility];  Service: Ophthalmology;  Laterality: Left;  CDE:7.54  . Cataract extraction w/phaco Right 04/15/2013    Procedure: CATARACT EXTRACTION PHACO AND INTRAOCULAR LENS PLACEMENT (IOC);  Surgeon: Tonny Branch, MD;  Location: [facility];  Service: Ophthalmology;  Laterality: Right;  CDE 9.38  . Mastectomy modified radical Left 11/04/2013    Procedure: LEFT MASTECTOMY MODIFIED RADICAL;  Surgeon: Jamesetta So, MD;  Location: [facility];  Service: General;  Laterality: Left;  . Portacath placement Right 12/06/2013    Procedure: INSERTION PORT-A-CATH-Right subclavian;  Surgeon: Jamesetta So, MD;  Location: [facility];  Service: General;  Laterality: Right;    There were no vitals filed for this visit.  Visit Diagnosis:   Fatigue due to treatment  Midline low back pain without sciatica  Stiffness of hip joint, unspecified laterality  Bilateral low back pain without sciatica      Subjective Assessment - 04/18/14 0803    Subjective Patient states that she's still getting around 4000 steps in a day, no real pain today.    Pertinent History Breast cancer; Low back pain   Currently in Pain? No/denies                       [facility] Adult PT Treatment/Exercise - 04/18/14 0001    Lumbar Exercises: Stretches   Active Hamstring Stretch 30 seconds;2 reps   Active Hamstring Stretch Limitations 12 inch box    Passive Hamstring Stretch 3 reps;30 seconds   Passive Hamstring Stretch Limitations gastroc, slantboard    Piriformis Stretch 3 reps;30 seconds   Piriformis Stretch Limitations supine    Lumbar Exercises: Aerobic   Stationary Bike nustep level 2 x9 minutes, seat 9 65-70SPM   Knee/Hip Exercises: Standing   Forward Lunges Both;1 set;10 reps   Forward Lunges Limitations 4 inch step    Side Lunges Both;1 set;10 reps   Side Lunges Limitations 4 inch box    Wall Squat 1 set;10 reps   Wall Squat Limitations cues for form             Balance Exercises - 04/18/14 0630  Balance Exercises: Standing   Tandem Stance Eyes open;3 reps;15 secs   SLS Eyes open;Solid surface;3 reps   Wall Bumps --  posterior wall bumps 1x10           PT Education - 04/18/14 0839    Education provided Yes   Education Details Education regarding need for physical challenge in PT   Person(s) Educated Patient   Methods Explanation   Comprehension Verbalized understanding          PT Short Term Goals - 04/11/14 1706    PT SHORT TERM GOAL #1   Title Pt to be I in HEP   Time 1   Status New   PT SHORT TERM GOAL #2   Title Pt to have stated that she has started a walking program at home   Time 2   Period Weeks           PT Long Term Goals - 04/11/14 1707    PT LONG TERM GOAL #1   Title I in  advance HEP   Time 4   Period Weeks   PT LONG TERM GOAL #2   Title Pt to be walking for at lease 15 minutes a day 3x a week   Time 4   Period Weeks   PT LONG TERM GOAL #3   Title Pt to be able to come sit to stand without use of UE assist   Time 4   Period Weeks   PT LONG TERM GOAL #4   Title Pt to be able to SLS for 10 seconds for reduce risk of falling   Time 4   Period Weeks   PT LONG TERM GOAL #5   Title Pt TUG to be 12 or less to demonstrate decreased risk of falling    Time 4   Period Weeks   Additional Long Term Goals   Additional Long Term Goals Yes   PT LONG TERM GOAL #6   Title FACIT-F to have improved by 25 points   Time 4   Period Weeks               Plan - 04/18/14 0849    Clinical Impression Statement Continued functional exercise program today with introduction of lunges and squats, also introduced multiple balance activities including single leg stance. Finished on Nustep for improved activity tolerance. Patient participated well in today's session but did require cues and extended time to complete execises, possibly in part due to combination of balance deficits, fatigue, and stiffness.    Pt will benefit from skilled therapeutic intervention in order to improve on the following deficits Decreased activity tolerance;Decreased balance;Decreased endurance;Decreased strength;Difficulty walking;Decreased range of motion   Rehab Potential Good   PT Frequency 3x / week   PT Duration 4 weeks   PT Treatment/Interventions Therapeutic activities;Therapeutic exercise;Balance training;Functional mobility training;Patient/family education;Manual techniques   PT Next Visit Plan Continue lunges, wall squats, introduce step ups, continue balance work including SLS. Standing T-band postural exercises. Progress exercises as able and tolerated.    PT Home Exercise Plan given   Consulted and Agree with Plan of Care Patient        Problem List Patient Active Problem  List   Diagnosis Date Noted  . Family history of ovarian cancer   . Family history of colon cancer   . Family history of pancreatic cancer   . Breast cancer 11/04/2013  . Low back pain 11/12/2012    Deniece Ree PT, DPT [phone]  [place], [place], [address] Phone: [phone]   Fax:  [phone]

## 2014-04-23 ENCOUNTER — Ambulatory Visit (HOSPITAL_COMMUNITY): Payer: Medicare Other | Admitting: Physical Therapy

## 2014-04-23 DIAGNOSIS — M545 Low back pain, unspecified: Secondary | ICD-10-CM

## 2014-04-23 DIAGNOSIS — M25659 Stiffness of unspecified hip, not elsewhere classified: Secondary | ICD-10-CM

## 2014-04-23 DIAGNOSIS — R5383 Other fatigue: Secondary | ICD-10-CM

## 2014-04-23 NOTE — Therapy (Signed)
[place], [place], [address] Phone: [phone]   Fax:  [phone]  Physical Therapy Treatment  Patient Details  Name: Tiffany Sweeney MRN: [id number] Date of Birth: Apr 15, 1934 Referring Provider:  Patrici Ranks, MD  Encounter Date: 04/23/2014      PT End of Session - 04/23/14 1115    Visit Number 5   Number of Visits 12   Date for PT Re-Evaluation 05/11/14   Authorization Type medicare    PT Start Time 0802   PT Stop Time 0850   PT Time Calculation (min) 48 min      Past Medical History  Diagnosis Date  . High cholesterol   . Hypertension   . Anxiety   . Cancer     breast- right  . Arthritis   . Breast cancer   . Family history of ovarian cancer   . Family history of colon cancer   . Family history of pancreatic cancer     Past Surgical History  Procedure Laterality Date  . Abdominal hysterectomy    . Mastectomy Right   . Cataract extraction w/phaco Left 03/11/2013    Procedure: CATARACT EXTRACTION PHACO AND INTRAOCULAR LENS PLACEMENT (IOC);  Surgeon: Tonny Branch, MD;  Location: [facility];  Service: Ophthalmology;  Laterality: Left;  CDE:7.54  . Cataract extraction w/phaco Right 04/15/2013    Procedure: CATARACT EXTRACTION PHACO AND INTRAOCULAR LENS PLACEMENT (IOC);  Surgeon: Tonny Branch, MD;  Location: [facility];  Service: Ophthalmology;  Laterality: Right;  CDE 9.38  . Mastectomy modified radical Left 11/04/2013    Procedure: LEFT MASTECTOMY MODIFIED RADICAL;  Surgeon: Jamesetta So, MD;  Location: [facility];  Service: General;  Laterality: Left;  . Portacath placement Right 12/06/2013    Procedure: INSERTION PORT-A-CATH-Right subclavian;  Surgeon: Jamesetta So, MD;  Location: [facility];  Service: General;  Laterality: Right;    There were no vitals filed for this visit.  Visit Diagnosis:  Fatigue due to treatment  Stiffness of hip joint, unspecified laterality  Midline low back pain without  sciatica  Bilateral low back pain without sciatica      Subjective Assessment - 04/23/14 0834    Subjective Pt has been doing her exercises but has not really been walking    Currently in Pain? No/denies            [facility] Adult PT Treatment/Exercise - 04/23/14 0001    Lumbar Exercises: Stretches   Active Hamstring Stretch 1 rep;60 seconds;Limitations   Active Hamstring Stretch Limitations 14 inch box needs assist with rt.    Passive Hamstring Stretch 3 reps;30 seconds   Passive Hamstring Stretch Limitations slant board    Piriformis Stretch 2 reps;60 seconds   Piriformis Stretch Limitations seated   Lumbar Exercises: Aerobic   Stationary Bike nustep level 2 x9 minutes, seat 9 65-70SPM   Lumbar Exercises: Standing   Heel Raises 10 reps   Heel Raises Limitations with 1# combined with squts   Functional Squats 10 reps   Scapular Retraction Strengthening;Both;10 reps;Theraband   Theraband Level (Scapular Retraction) Level 3 (Green)   Row Strengthening;Both;10 reps;Theraband   Theraband Level (Row) Level 3 (Green)   Shoulder Extension Strengthening;10 reps;Theraband   Theraband Level (Shoulder Extension) Level 3 (Green)   Other Standing Lumbar Exercises side step with t band x 1 RT; SLS x 3 with 20 second holds     Other Standing Lumbar Exercises step over 4' hurdles x 5 RT ;  forward step up 4" x 10 each.   Lumbar Exercises: Seated   Sit to Stand 10 reps                  PT Short Term Goals - 04/11/14 1706    PT SHORT TERM GOAL #1   Title Pt to be I in HEP   Time 1   Status New   PT SHORT TERM GOAL #2   Title Pt to have stated that she has started a walking program at home   Time 2   Period Weeks           PT Long Term Goals - 04/11/14 1707    PT LONG TERM GOAL #1   Title I in advance HEP   Time 4   Period Weeks   PT LONG TERM GOAL #2   Title Pt to be walking for at lease 15 minutes a day 3x a week   Time 4   Period Weeks   PT LONG TERM GOAL #3    Title Pt to be able to come sit to stand without use of UE assist   Time 4   Period Weeks   PT LONG TERM GOAL #4   Title Pt to be able to SLS for 10 seconds for reduce risk of falling   Time 4   Period Weeks   PT LONG TERM GOAL #5   Title Pt TUG to be 12 or less to demonstrate decreased risk of falling    Time 4   Period Weeks   Additional Long Term Goals   Additional Long Term Goals Yes   PT LONG TERM GOAL #6   Title FACIT-F to have improved by 25 points   Time 4   Period Weeks               Plan - 04/23/14 1115    Clinical Impression Statement Added exercises to challenge both balance and strength.  Therapist facilitated all exercises to maintaing pt safety.  Pt improving but continues to be very tight in B hip mm affecting functional ability, ie putting socks on.    PT Next Visit Plan Continue with more advance exercises that challenge both strength and balance ie tandem gt on foam         Problem List Patient Active Problem List   Diagnosis Date Noted  . Family history of ovarian cancer   . Family history of colon cancer   . Family history of pancreatic cancer   . Breast cancer 11/04/2013  . Low back pain 11/12/2012  Rayetta Humphrey, PT CLT [phone] 04/23/2014, 11:23 AM  [place] [address] [place], [place], [address] Phone: [phone]   Fax:  [phone]

## 2014-04-25 ENCOUNTER — Ambulatory Visit (HOSPITAL_COMMUNITY): Payer: Medicare Other | Admitting: Physical Therapy

## 2014-04-29 ENCOUNTER — Encounter: Payer: Self-pay | Admitting: Genetic Counselor

## 2014-04-29 ENCOUNTER — Telehealth: Payer: Self-pay | Admitting: Genetic Counselor

## 2014-04-29 DIAGNOSIS — Z1379 Encounter for other screening for genetic and chromosomal anomalies: Secondary | ICD-10-CM | POA: Insufficient documentation

## 2014-04-29 DIAGNOSIS — Z8041 Family history of malignant neoplasm of ovary: Secondary | ICD-10-CM

## 2014-04-29 DIAGNOSIS — C50919 Malignant neoplasm of unspecified site of unspecified female breast: Secondary | ICD-10-CM

## 2014-04-29 DIAGNOSIS — Z800 Family history of malignant neoplasm of digestive organs: Secondary | ICD-10-CM

## 2014-04-29 NOTE — Progress Notes (Signed)
HPI: Tiffany Sweeney was previously seen in the [facility] due to a personal and family history of cancer and concerns regarding a hereditary predisposition to cancer. Please refer to our prior cancer genetics clinic note for more information regarding Tiffany Sweeney's medical, social and family histories, and our assessment and recommendations, at the time. Tiffany Sweeney recent genetic test results were disclosed to her, as were recommendations warranted by these results. These results and recommendations are discussed in more detail below.  GENETIC TEST RESULTS: At the time of Tiffany Sweeney's visit, we recommended she pursue genetic testing of the Breast/Ovarian cancer gene panel. The Breast/Ovarian gene panel offered by [facility] includes sequencing and rearrangement analysis for the following 21 genes:  ATM, BARD1, BRCA1, BRCA2, BRIP1, CDH1, CHEK2, EPCAM, FANCC, MLH1, MSH2, MSH6, NBN, PALB2, PMS2, PTEN, RAD51C, RAD51D, STK11, TP53, and XRCC2.    The report date is April 27, 2014.  Genetic testing was normal, and did not reveal a deleterious mutation in these genes. The test report has been scanned into EPIC and is located under the Media tab.   We discussed with Tiffany Sweeney that since the current genetic testing is not perfect, it is possible there may be a gene mutation in one of these genes that current testing cannot detect, but that chance is small. We also discussed, that it is possible that another gene that has not yet been discovered, or that we have not yet tested, is responsible for the cancer diagnoses in the family, and it is, therefore, important to remain in touch with cancer genetics in the future so that we can continue to offer Tiffany Sweeney the most up to date genetic testing.   ADDITIONAL GENETIC TESTING: Tiffany Sweeney revealed that her niece was being tested in [place].  We discussed that if her niece tested positive for something that we would want to ensure that Tiffany Sweeney  was also tested for the gene with the genetic change.  If not, we would need to consider additional testing.   CANCER SCREENING RECOMMENDATIONS: This result is reassuring and indicates that Tiffany Sweeney likely does not have an increased risk for a future cancer due to a mutation in one of these genes. This normal test also suggests that Tiffany Sweeney's cancer was most likely not due to an inherited predisposition associated with one of these genes.  Most cancers happen by chance and this negative test suggests that her cancer falls into this category.  We, therefore, recommended she continue to follow the cancer management and screening guidelines provided by her oncology and primary healthcare provider.   RECOMMENDATIONS FOR FAMILY MEMBERS: Women in this family might be at some increased risk of developing cancer, over the general population risk, simply due to the family history of cancer. We recommended women in this family have a yearly mammogram beginning at age 29, or 52 years younger than the earliest onset of cancer, an an annual clinical breast exam, and perform monthly breast self-exams. Women in this family should also have a gynecological exam as recommended by their primary provider. All family members should have a colonoscopy by age 50.  FOLLOW-UP: Lastly, we discussed with Tiffany Sweeney that cancer genetics is a rapidly advancing field and it is possible that new genetic tests will be appropriate for her and/or her family members in the future. We encouraged her to remain in contact with cancer genetics on an annual basis so we can update her personal and family histories and let  her know of advances in cancer genetics that may benefit this family.   Our contact number was provided. Tiffany Sweeney questions were answered to her satisfaction, and she knows she is welcome to call us at anytime with additional questions or concerns.   Roma Kayser, MS, [facility] Certified Genetic  Counselor [email or web address]

## 2014-04-29 NOTE — Telephone Encounter (Signed)
Revealed negative genetic testing on the gene panel.  Her niece is also being tested in [place], but does not have results back yet.

## 2014-04-30 ENCOUNTER — Ambulatory Visit (HOSPITAL_COMMUNITY): Payer: Medicare Other | Admitting: Physical Therapy

## 2014-04-30 DIAGNOSIS — R5383 Other fatigue: Secondary | ICD-10-CM

## 2014-04-30 DIAGNOSIS — M25659 Stiffness of unspecified hip, not elsewhere classified: Secondary | ICD-10-CM

## 2014-04-30 DIAGNOSIS — M545 Low back pain, unspecified: Secondary | ICD-10-CM

## 2014-04-30 NOTE — Therapy (Signed)
[place], [place], [address] Phone: [phone]   Fax:  [phone]  Physical Therapy Treatment  Patient Details  Name: Tiffany Sweeney MRN: [id number] Date of Birth: 1934/02/23 Referring Provider:  Patrici Ranks, MD  Encounter Date: 04/30/2014      PT End of Session - 04/30/14 0854    Visit Number 6   Number of Visits 12   Date for PT Re-Evaluation 05/11/14   Authorization Type medicare    PT Start Time 0802   PT Stop Time 0850   PT Time Calculation (min) 48 min   Activity Tolerance Patient tolerated treatment well      Past Medical History  Diagnosis Date  . High cholesterol   . Hypertension   . Anxiety   . Cancer     breast- right  . Arthritis   . Breast cancer   . Family history of ovarian cancer   . Family history of colon cancer   . Family history of pancreatic cancer     Past Surgical History  Procedure Laterality Date  . Abdominal hysterectomy    . Mastectomy Right   . Cataract extraction w/phaco Left 03/11/2013    Procedure: CATARACT EXTRACTION PHACO AND INTRAOCULAR LENS PLACEMENT (IOC);  Surgeon: Tonny Branch, MD;  Location: [facility];  Service: Ophthalmology;  Laterality: Left;  CDE:7.54  . Cataract extraction w/phaco Right 04/15/2013    Procedure: CATARACT EXTRACTION PHACO AND INTRAOCULAR LENS PLACEMENT (IOC);  Surgeon: Tonny Branch, MD;  Location: [facility];  Service: Ophthalmology;  Laterality: Right;  CDE 9.38  . Mastectomy modified radical Left 11/04/2013    Procedure: LEFT MASTECTOMY MODIFIED RADICAL;  Surgeon: Jamesetta So, MD;  Location: [facility];  Service: General;  Laterality: Left;  . Portacath placement Right 12/06/2013    Procedure: INSERTION PORT-A-CATH-Right subclavian;  Surgeon: Jamesetta So, MD;  Location: [facility];  Service: General;  Laterality: Right;    There were no vitals filed for this visit.  Visit Diagnosis:  Fatigue due to treatment  Stiffness of hip joint,  unspecified laterality  Midline low back pain without sciatica      Subjective Assessment - 04/30/14 0828    Subjective Pt states she woke up with her back and hip bothering her    Currently in Pain? Yes   Pain Score 4    Pain Location Hip   Pain Descriptors / Indicators Nagging          Stretches were completed last 62' on MHP     [facility] Adult PT Treatment/Exercise - 04/30/14 0001    Lumbar Exercises: Stretches   Active Hamstring Stretch 3 reps;30 seconds   Single Knee to Chest Stretch 3 reps;30 seconds   Lower Trunk Rotation 5 reps  x2   Piriformis Stretch 2 reps;60 seconds   Piriformis Stretch Limitations supine   as well as passive stretch to both IR and ER of hips              Balance Exercises - 04/30/14 0829    Balance Exercises: Standing   Balance Beam tandem/retro/side step/lunging x 2 RT each; standing with UE flexion x 10; narrow base of support with head turns.    Heel Raises Limitations x5 with yellow ball and functional squatting    Other Standing Exercises side step over 4" hurdle x 10 ; forward step over hurdle x 10            PT Education -  04/30/14 0853    Education provided Yes   Education Details If Low back pain increases to work on stretches to decrease pain   Person(s) Educated Patient   Methods Explanation   Comprehension Verbalized understanding          PT Short Term Goals - 04/30/14 0857    PT SHORT TERM GOAL #1   Title Pt to be I in HEP   Time 1   Status Achieved   PT SHORT TERM GOAL #2   Title Pt to have stated that she has started a walking program at home   Time 2   Period Weeks   Status On-going           PT Long Term Goals - 04/30/14 0857    PT LONG TERM GOAL #1   Title I in advance HEP   Time 4   Period Weeks   Status On-going   PT LONG TERM GOAL #2   Title Pt to be walking for at lease 15 minutes a day 3x a week   Time 4   Period Weeks   Status On-going   PT LONG TERM GOAL #3   Title Pt to be  able to come sit to stand without use of UE assist   Time 4   Period Weeks   Status On-going   PT LONG TERM GOAL #4   Title Pt to be able to SLS for 10 seconds for reduce risk of falling   Time 4   Period Weeks   Status On-going   PT LONG TERM GOAL #5   Title Pt TUG to be 12 or less to demonstrate decreased risk of falling    Time 4   Period Weeks   Status On-going   PT LONG TERM GOAL #6   Title FACIT-F to have improved by 25 points   Time 4   Period Weeks   Status On-going               Plan - 04/30/14 3299    Clinical Impression Statement Pt having incrased pain upon arrival today therefore last 15 minutes were devouted to stretching while on moist heat.     PT Next Visit Plan Continue to progress as able with exercises for both balance and strength.  Continue with Warrior poses.         Problem List Patient Active Problem List   Diagnosis Date Noted  . Genetic testing 04/29/2014  . Family history of ovarian cancer   . Family history of colon cancer   . Family history of pancreatic cancer   . Breast cancer 11/04/2013  . Low back pain 11/12/2012  Rayetta Humphrey, PT CLT [phone] 04/30/2014, 8:58 AM  [facility] [address] [place], [place], [address] Phone: [phone]   Fax:  [phone]

## 2014-05-02 ENCOUNTER — Ambulatory Visit (HOSPITAL_COMMUNITY): Payer: Medicare Other | Admitting: Physical Therapy

## 2014-05-05 ENCOUNTER — Encounter (HOSPITAL_COMMUNITY): Payer: Self-pay

## 2014-05-05 ENCOUNTER — Encounter (HOSPITAL_COMMUNITY): Payer: Medicare Other | Attending: Hematology and Oncology

## 2014-05-05 ENCOUNTER — Ambulatory Visit (HOSPITAL_COMMUNITY): Payer: Medicare Other | Attending: Hematology & Oncology | Admitting: Physical Therapy

## 2014-05-05 VITALS — BP 167/83 | HR 60 | Temp 98.0°F | Resp 20

## 2014-05-05 DIAGNOSIS — I10 Essential (primary) hypertension: Secondary | ICD-10-CM | POA: Insufficient documentation

## 2014-05-05 DIAGNOSIS — R262 Difficulty in walking, not elsewhere classified: Secondary | ICD-10-CM | POA: Diagnosis not present

## 2014-05-05 DIAGNOSIS — M545 Low back pain, unspecified: Secondary | ICD-10-CM

## 2014-05-05 DIAGNOSIS — C50911 Malignant neoplasm of unspecified site of right female breast: Secondary | ICD-10-CM | POA: Insufficient documentation

## 2014-05-05 DIAGNOSIS — M810 Age-related osteoporosis without current pathological fracture: Secondary | ICD-10-CM | POA: Insufficient documentation

## 2014-05-05 DIAGNOSIS — Z170 Estrogen receptor positive status [ER+]: Secondary | ICD-10-CM | POA: Insufficient documentation

## 2014-05-05 DIAGNOSIS — R5383 Other fatigue: Secondary | ICD-10-CM | POA: Diagnosis not present

## 2014-05-05 DIAGNOSIS — Z452 Encounter for adjustment and management of vascular access device: Secondary | ICD-10-CM

## 2014-05-05 DIAGNOSIS — Z9221 Personal history of antineoplastic chemotherapy: Secondary | ICD-10-CM | POA: Diagnosis not present

## 2014-05-05 DIAGNOSIS — C50912 Malignant neoplasm of unspecified site of left female breast: Secondary | ICD-10-CM | POA: Insufficient documentation

## 2014-05-05 DIAGNOSIS — M25659 Stiffness of unspecified hip, not elsewhere classified: Secondary | ICD-10-CM | POA: Diagnosis not present

## 2014-05-05 DIAGNOSIS — C50412 Malignant neoplasm of upper-outer quadrant of left female breast: Secondary | ICD-10-CM | POA: Diagnosis not present

## 2014-05-05 DIAGNOSIS — Z95828 Presence of other vascular implants and grafts: Secondary | ICD-10-CM

## 2014-05-05 MED ORDER — SODIUM CHLORIDE 0.9 % IJ SOLN
10.0000 mL | INTRAMUSCULAR | Status: DC | PRN
  Administered 2014-05-05: 10 mL via INTRAVENOUS
  Filled 2014-05-05: qty 10

## 2014-05-05 MED ORDER — HEPARIN SOD (PORK) LOCK FLUSH 100 UNIT/ML IV SOLN
500.0000 [IU] | Freq: Once | INTRAVENOUS | Status: AC
  Administered 2014-05-05: 500 [IU] via INTRAVENOUS

## 2014-05-05 MED ORDER — HEPARIN SOD (PORK) LOCK FLUSH 100 UNIT/ML IV SOLN
INTRAVENOUS | Status: AC
  Filled 2014-05-05: qty 5

## 2014-05-05 NOTE — Therapy (Signed)
[place], [place], [address] Phone: [phone]   Fax:  [phone]  Physical Therapy Treatment  Patient Details  Name: Tiffany Sweeney MRN: [id number] Date of Birth: [birth year] Referring Provider:  Patrici Ranks, MD  Encounter Date: 05/05/2014      PT End of Session - 05/05/14 1218    Visit Number 7   Number of Visits 12   Date for PT Re-Evaluation 05/11/14   Authorization Type medicare    PT Start Time 0850   PT Stop Time 0933   PT Time Calculation (min) 43 min      Past Medical History  Diagnosis Date  . High cholesterol   . Hypertension   . Anxiety   . Cancer     breast- right  . Arthritis   . Breast cancer   . Family history of ovarian cancer   . Family history of colon cancer   . Family history of pancreatic cancer     Past Surgical History  Procedure Laterality Date  . Abdominal hysterectomy    . Mastectomy Right   . Cataract extraction w/phaco Left 03/11/2013    Procedure: CATARACT EXTRACTION PHACO AND INTRAOCULAR LENS PLACEMENT (IOC);  Surgeon: Tonny Branch, MD;  Location: [facility];  Service: Ophthalmology;  Laterality: Left;  CDE:7.54  . Cataract extraction w/phaco Right 04/15/2013    Procedure: CATARACT EXTRACTION PHACO AND INTRAOCULAR LENS PLACEMENT (IOC);  Surgeon: Tonny Branch, MD;  Location: [facility];  Service: Ophthalmology;  Laterality: Right;  CDE 9.38  . Mastectomy modified radical Left 11/04/2013    Procedure: LEFT MASTECTOMY MODIFIED RADICAL;  Surgeon: Jamesetta So, MD;  Location: [facility];  Service: General;  Laterality: Left;  . Portacath placement Right 12/06/2013    Procedure: INSERTION PORT-A-CATH-Right subclavian;  Surgeon: Jamesetta So, MD;  Location: [facility];  Service: General;  Laterality: Right;    There were no vitals filed for this visit.  Visit Diagnosis:  Fatigue due to treatment  Stiffness of hip joint, unspecified laterality  Midline low back pain without  sciatica      Subjective Assessment - 05/05/14 0853    Subjective Pt states that she orke up with her ankles swollen.  Pt wears support hose already.    Currently in Pain? No/denies             [facility] Adult PT Treatment/Exercise - 05/05/14 0001    Lumbar Exercises: Machines for Strengthening   Cybex Lumbar Extension 2Pl x 15    Cybex Knee Extension 1 PL x 15   Cybex Knee Flexion 3Pl x 15    Leg Press 4Plx 15   Other Lumbar Machine Exercise chest    Lumbar Exercises: Standing   Heel Raises 10 reps   Heel Raises Limitations with 1# combined with squats   Functional Squats 10 reps   Other Standing Lumbar Exercises side step over 4" hurdle x 10; forward with 8" hurdle x 4    Manual Therapy   Manual Therapy Edema management   Edema Management decongestive techiques from knee to ankle Lt;  knee and foot only on Rt secondary to bruising where pt is sore.                   PT Short Term Goals - 04/30/14 0857    PT SHORT TERM GOAL #1   Title Pt to be I in HEP   Time 1   Status Achieved   PT  SHORT TERM GOAL #2   Title Pt to have stated that she has started a walking program at home   Time 2   Period Weeks   Status On-going           PT Long Term Goals - 04/30/14 0857    PT LONG TERM GOAL #1   Title I in advance HEP   Time 4   Period Weeks   Status On-going   PT LONG TERM GOAL #2   Title Pt to be walking for at lease 15 minutes a day 3x a week   Time 4   Period Weeks   Status On-going   PT LONG TERM GOAL #3   Title Pt to be able to come sit to stand without use of UE assist   Time 4   Period Weeks   Status On-going   PT LONG TERM GOAL #4   Title Pt to be able to SLS for 10 seconds for reduce risk of falling   Time 4   Period Weeks   Status On-going   PT LONG TERM GOAL #5   Title Pt TUG to be 12 or less to demonstrate decreased risk of falling    Time 4   Period Weeks   Status On-going   PT LONG TERM GOAL #6   Title FACIT-F to have improved by  25 points   Time 4   Period Weeks   Status On-going               Plan - 05/05/14 1218    Clinical Impression Statement  Added strengthening machines back into pt program.  Pt to department with significant swollen ankles, (Lt greater than Rt),  Pt states that her ankles do this periodically.  She wears compression hose 75mmhg; she has tried 30 but she is unable to get them on.  Pt recieved manual techniques to decrease edma with good results.    PT Next Visit Plan assess swelling of ankles,(take circumferencial measurement if pt is still having a problem with this),  Begin tandem gt on foam         Problem List Patient Active Problem List   Diagnosis Date Noted  . Genetic testing 04/29/2014  . Family history of ovarian cancer   . Family history of colon cancer   . Family history of pancreatic cancer   . Breast cancer 11/04/2013  . Low back pain 11/12/2012   Rayetta Humphrey, PT CLT [phone] 05/05/2014, 12:23 PM  [facility] [address] [place], [place], [address] Phone: [phone]   Fax:  [phone]

## 2014-05-05 NOTE — Progress Notes (Signed)
Tiffany Sweeney presented for Portacath access and flush.  Proper placement of portacath confirmed by CXR.  Portacath located right chest wall accessed with  H 20 needle.  Good blood return present. Portacath flushed with 61ml NS and 500U/89ml Heparin and needle removed intact.  Procedure tolerated well and without incident.

## 2014-05-05 NOTE — Patient Instructions (Signed)
[place] at [facility]  Discharge Instructions:  Presented for a port flush today Follow up as scheduled Call the clinic if you have any questions or concerns _______________________________________________________________  Thank you for choosing [place] at [facility] to provide your oncology and hematology care.  To afford each patient quality time with our providers, please arrive at least 15 minutes before your scheduled appointment.  You need to re-schedule your appointment if you arrive 10 or more minutes late.  We strive to give you quality time with our providers, and arriving late affects you and other patients whose appointments are after yours.  Also, if you no show three or more times for appointments you may be dismissed from the clinic.  Again, thank you for choosing [place] [facility] [place] hope is that these requests will allow you access to exceptional care and in a timely manner. _______________________________________________________________  If you have questions after your visit, please contact our office at [phone] between the hours of 8:30 a.m. and 5:00 p.m. Voicemails left after 4:30 p.m. will not be returned until the following business day. _______________________________________________________________  For prescription refill requests, have your pharmacy contact our office. _______________________________________________________________  Recommendations made by the consultant and any test results will be sent to your referring physician. _______________________________________________________________

## 2014-05-06 ENCOUNTER — Encounter (HOSPITAL_COMMUNITY): Payer: Medicare Other

## 2014-05-07 ENCOUNTER — Ambulatory Visit (HOSPITAL_COMMUNITY): Payer: Medicare Other | Admitting: Physical Therapy

## 2014-05-07 DIAGNOSIS — M5442 Lumbago with sciatica, left side: Secondary | ICD-10-CM

## 2014-05-07 DIAGNOSIS — M545 Low back pain, unspecified: Secondary | ICD-10-CM

## 2014-05-07 DIAGNOSIS — M25659 Stiffness of unspecified hip, not elsewhere classified: Secondary | ICD-10-CM

## 2014-05-07 DIAGNOSIS — R5383 Other fatigue: Secondary | ICD-10-CM

## 2014-05-07 NOTE — Therapy (Signed)
[place], [place], [address] Phone: [phone]   Fax:  [phone]  Physical Therapy Treatment  Patient Details  Name: Tiffany Sweeney MRN: [id number] Date of Birth: 03-28-1934 Referring Provider:  Patrici Ranks, MD  Encounter Date: 05/07/2014      PT End of Session - 05/07/14 1050    Visit Number 8   Number of Visits 12   Date for PT Re-Evaluation 05/11/14   Authorization Type medicare    PT Start Time 0808   PT Stop Time 0848   PT Time Calculation (min) 40 min      Past Medical History  Diagnosis Date  . High cholesterol   . Hypertension   . Anxiety   . Cancer     breast- right  . Arthritis   . Breast cancer   . Family history of ovarian cancer   . Family history of colon cancer   . Family history of pancreatic cancer     Past Surgical History  Procedure Laterality Date  . Abdominal hysterectomy    . Mastectomy Right   . Cataract extraction w/phaco Left 03/11/2013    Procedure: CATARACT EXTRACTION PHACO AND INTRAOCULAR LENS PLACEMENT (IOC);  Surgeon: Tonny Branch, MD;  Location: [facility];  Service: Ophthalmology;  Laterality: Left;  CDE:7.54  . Cataract extraction w/phaco Right 04/15/2013    Procedure: CATARACT EXTRACTION PHACO AND INTRAOCULAR LENS PLACEMENT (IOC);  Surgeon: Tonny Branch, MD;  Location: [facility];  Service: Ophthalmology;  Laterality: Right;  CDE 9.38  . Mastectomy modified radical Left 11/04/2013    Procedure: LEFT MASTECTOMY MODIFIED RADICAL;  Surgeon: Jamesetta So, MD;  Location: [facility];  Service: General;  Laterality: Left;  . Portacath placement Right 12/06/2013    Procedure: INSERTION PORT-A-CATH-Right subclavian;  Surgeon: Jamesetta So, MD;  Location: [facility];  Service: General;  Laterality: Right;    There were no vitals filed for this visit.  Visit Diagnosis:  Fatigue due to treatment  Stiffness of hip joint, unspecified laterality  Midline low back pain without  sciatica  Bilateral low back pain without sciatica  Midline low back pain with left-sided sciatica      Subjective Assessment - 05/07/14 0808    Subjective Pt states she is having the most trouble getting up and down,.   Currently in Pain? No/denies                [facility] Adult PT Treatment/Exercise - 05/07/14 0001    Bed Mobility   Bed Mobility Rolling Right;Rolling Left   Lumbar Exercises: Stretches   Prone on Elbows Stretch Other (comment)   Prone on Elbows Stretch Limitations 3'   Press Ups 5 reps;Limitations   Press Ups Limitations 1/4 up    Piriformis Stretch 1 rep;60 seconds;Limitations   Lumbar Exercises: Standing   Wall Slides 5 reps   Other Standing Lumbar Exercises pick item off 10" step x 10    Lumbar Exercises: Seated   Sit to Stand 10 reps   Lumbar Exercises: Prone   Straight Leg Raise 10 reps                PT Education - 05/07/14 1049    Education provided Yes   Education Details given for new exercises for strengthening    Person(s) Educated Patient   Methods Explanation;Handout   Comprehension Verbalized understanding;Returned demonstration          PT Short Term Goals - 04/30/14 [phone]  PT SHORT TERM GOAL #1   Title Pt to be I in HEP   Time 1   Status Achieved   PT SHORT TERM GOAL #2   Title Pt to have stated that she has started a walking program at home   Time 2   Period Weeks   Status On-going           PT Long Term Goals - 04/30/14 0857    PT LONG TERM GOAL #1   Title I in advance HEP   Time 4   Period Weeks   Status On-going   PT LONG TERM GOAL #2   Title Pt to be walking for at lease 15 minutes a day 3x a week   Time 4   Period Weeks   Status On-going   PT LONG TERM GOAL #3   Title Pt to be able to come sit to stand without use of UE assist   Time 4   Period Weeks   Status On-going   PT LONG TERM GOAL #4   Title Pt to be able to SLS for 10 seconds for reduce risk of falling   Time 4   Period Weeks    Status On-going   PT LONG TERM GOAL #5   Title Pt TUG to be 12 or less to demonstrate decreased risk of falling    Time 4   Period Weeks   Status On-going   PT LONG TERM GOAL #6   Title FACIT-F to have improved by 25 points   Time 4   Period Weeks   Status On-going               Plan - 05/07/14 1050    Clinical Impression Statement Began new exercises to forcus on strengthening and flexibility of back mm.  Pt main complaint at this time is difficulty coming sit to stand and unable to pick items off the floor therapy session concentrated on these activites or exercises to make these activites easier.   Pt ankles are no longer swollen.    PT Next Visit Plan continue with lifting , wall slides add tandem gt on foam for balance         Problem List Patient Active Problem List   Diagnosis Date Noted  . Genetic testing 04/29/2014  . Family history of ovarian cancer   . Family history of colon cancer   . Family history of pancreatic cancer   . Breast cancer 11/04/2013  . Low back pain 11/12/2012   Rayetta Humphrey, PT CLT [phone] 05/07/2014, 10:53 AM  [place], [place], [address] Phone: [phone]   Fax:  [phone]

## 2014-05-07 NOTE — Patient Instructions (Signed)
Piriformis (Supine)   Cross legs, right on top.  until stretch is felt in buttock/hip of top leg. Hold 300____ seconds. Repeat __2_ times per set. Do __1__ sets per session. Do _2___ sessions per day.  http://orth.exer.us/676   Copyright  VHI. All rights reserved.  Straight Leg Raise (Prone)   Abdomen and head supported, keep left knee locked and raise leg at hip. Avoid arching low back. Repeat ____ times per set. Do ____ sets per session. Do ____ sessions per day.  http://orth.exer.us/1112   Copyright  VHI. All rights reserved.  On Elbows (Prone)   Rise up on elbows as high as possible, keeping hips on floor. Hold ___300_ seconds. Repeat __2__ times per set. Do _1___ sets per session. Do ___2_ sessions per day.  http://orth.exer.us/92   Copyright  VHI. All rights reserved.  Press-Up   Press upper body upward, keeping hips in contact with floor. Keep lower back and buttocks relaxed. Hold __3__ seconds. Repeat __5-15__ times per set. Do __1__ sets per session. Do __2__ sessions per day.  http://orth.exer.us/94   Copyright  VHI. All rights reserved.  Wall Slide   Keep head, shoulders, and back against wall, with feet out in front and slightly wider than shoulder width. Slowly lower buttocks by sliding down wall . Keep back flat. Repeat __5-15__ times per set. Do __1__ sets per session. Do ____ sessions per day.  http://orth.exer.us/152   Copyright  VHI. All rights reserved.

## 2014-05-09 ENCOUNTER — Ambulatory Visit (HOSPITAL_COMMUNITY): Payer: Medicare Other

## 2014-05-09 DIAGNOSIS — M25659 Stiffness of unspecified hip, not elsewhere classified: Secondary | ICD-10-CM

## 2014-05-09 DIAGNOSIS — M545 Low back pain, unspecified: Secondary | ICD-10-CM

## 2014-05-09 DIAGNOSIS — M5442 Lumbago with sciatica, left side: Secondary | ICD-10-CM

## 2014-05-09 DIAGNOSIS — R5383 Other fatigue: Secondary | ICD-10-CM

## 2014-05-09 NOTE — Therapy (Signed)
[facility] [address] [place], [place], [address] Phone: [phone]   Fax:  [phone]  Physical Therapy Treatment  Patient Details  Name: Tiffany Sweeney MRN: [id number] Date of Birth: 21-Aug-1934 Referring Provider:  Lemmie Evens, MD  Encounter Date: 05/09/2014      PT End of Session - 05/09/14 0908    Visit Number 9   Number of Visits 12   Date for PT Re-Evaluation 05/11/14   Authorization Type medicare    PT Start Time 0805   PT Stop Time 8144   PT Time Calculation (min) 50 min   Activity Tolerance Patient tolerated treatment well   Behavior During Therapy [facility] for tasks assessed/performed      Past Medical History  Diagnosis Date  . High cholesterol   . Hypertension   . Anxiety   . Cancer     breast- right  . Arthritis   . Breast cancer   . Family history of ovarian cancer   . Family history of colon cancer   . Family history of pancreatic cancer     Past Surgical History  Procedure Laterality Date  . Abdominal hysterectomy    . Mastectomy Right   . Cataract extraction w/phaco Left 03/11/2013    Procedure: CATARACT EXTRACTION PHACO AND INTRAOCULAR LENS PLACEMENT (IOC);  Surgeon: Tonny Branch, MD;  Location: [facility];  Service: Ophthalmology;  Laterality: Left;  CDE:7.54  . Cataract extraction w/phaco Right 04/15/2013    Procedure: CATARACT EXTRACTION PHACO AND INTRAOCULAR LENS PLACEMENT (IOC);  Surgeon: Tonny Branch, MD;  Location: [facility];  Service: Ophthalmology;  Laterality: Right;  CDE 9.38  . Mastectomy modified radical Left 11/04/2013    Procedure: LEFT MASTECTOMY MODIFIED RADICAL;  Surgeon: Jamesetta So, MD;  Location: [facility];  Service: General;  Laterality: Left;  . Portacath placement Right 12/06/2013    Procedure: INSERTION PORT-A-CATH-Right subclavian;  Surgeon: Jamesetta So, MD;  Location: [facility];  Service: General;  Laterality: Right;    There were no vitals filed for this visit.  Visit Diagnosis:   Fatigue due to treatment  Stiffness of hip joint, unspecified laterality  Midline low back pain without sciatica  Bilateral low back pain without sciatica  Midline low back pain with left-sided sciatica      Subjective Assessment - 05/09/14 0814    Subjective Pt stated nagging lower back pain scale 5/10   Currently in Pain? Yes   Pain Score 4    Pain Location Back   Pain Orientation Lower   Pain Descriptors / Indicators Nagging          [facility] Adult PT Treatment/Exercise - 05/09/14 0001    Bed Mobility   Bed Mobility Rolling Right;Rolling Left   Rolling Right 5: Supervision   Lumbar Exercises: Stretches   Prone on Elbows Stretch Limitations 3'   Press Ups 5 reps;Limitations   Press Ups Limitations 1/4 up    Lumbar Exercises: Aerobic   Stationary Bike nustep level 3 x10 minutes, seat 9 65-70SPM   Lumbar Exercises: Standing   Wall Slides 10 reps;5 seconds   Other Standing Lumbar Exercises pick item off 10" step x 10    Other Standing Lumbar Exercises 3D hip excursion with therapist facilitation 10x   Lumbar Exercises: Seated   Sit to Stand 10 reps          Balance Exercises - 05/09/14 1024    Balance Exercises: Standing   Balance Beam tandem, retro and sidestepping 2RT  PT Short Term Goals - 05/09/14 0908    PT SHORT TERM GOAL #1   Title Pt to be I in HEP   Status Achieved   PT SHORT TERM GOAL #2   Title Pt to have stated that she has started a walking program at home   Status On-going           PT Long Term Goals - 05/09/14 0908    PT LONG TERM GOAL #1   Title I in advance HEP   Status On-going   PT LONG TERM GOAL #2   Title Pt to be walking for at lease 15 minutes a day 3x a week   Status On-going   PT LONG TERM GOAL #3   Title Pt to be able to come sit to stand without use of UE assist   Status On-going   PT LONG TERM GOAL #4   Title Pt to be able to SLS for 10 seconds for reduce risk of falling   PT LONG TERM GOAL #5   Title  Pt TUG to be 12 or less to demonstrate decreased risk of falling    PT LONG TERM GOAL #6   Title FACIT-F to have improved by 25 points   Status On-going               Plan - 05/09/14 1030    Clinical Impression Statement Session focus on improving hip mobility to reduce pt.'s rigid hip and back musculature to improve flexibilty with gait and functional strenghtening for increased ease with sit to stand and picking up items.  Pt unable to pick up items from floor, therapist facilitaiton required to improve form lifting from 10in height.  Continued balance activities on dynamic surface with min assistnace required for LOB.     PT Next Visit Plan continue with lifting, gluteal strenghtening wth sit to stand, wall slides add tandem gt on foam for balance         Problem List Patient Active Problem List   Diagnosis Date Noted  . Genetic testing 04/29/2014  . Family history of ovarian cancer   . Family history of colon cancer   . Family history of pancreatic cancer   . Breast cancer 11/04/2013  . Low back pain 11/12/2012   Ihor Austin, [place]; [place] #[id number] [phone]  Aldona Lento 05/09/2014, 10:36 AM  [place], [place], [address] Phone: [phone]   Fax:  [phone]

## 2014-05-12 ENCOUNTER — Ambulatory Visit (HOSPITAL_COMMUNITY): Payer: Medicare Other | Admitting: Physical Therapy

## 2014-05-12 DIAGNOSIS — M25659 Stiffness of unspecified hip, not elsewhere classified: Secondary | ICD-10-CM

## 2014-05-12 DIAGNOSIS — M545 Low back pain, unspecified: Secondary | ICD-10-CM

## 2014-05-12 DIAGNOSIS — R5383 Other fatigue: Secondary | ICD-10-CM

## 2014-05-12 DIAGNOSIS — M5442 Lumbago with sciatica, left side: Secondary | ICD-10-CM

## 2014-05-12 NOTE — Therapy (Signed)
[place], [place], [address] Phone: [phone]   Fax:  [phone]  Physical Therapy Treatment  Patient Details  Name: Tiffany Sweeney MRN: [id number] Date of Birth: Dec 17, 1934 Referring Provider:  Patrici Ranks, MD  Encounter Date: 05/12/2014      PT End of Session - 05/12/14 0851    Visit Number 10   Number of Visits 12   Date for PT Re-Evaluation 05/19/14   Authorization Type medicare    Authorization - Visit Number 10   Authorization - Number of Visits 20   PT Start Time 0804   PT Stop Time 0852   PT Time Calculation (min) 48 min   Activity Tolerance Patient tolerated treatment well   Behavior During Therapy [facility] for tasks assessed/performed      Past Medical History  Diagnosis Date  . High cholesterol   . Hypertension   . Anxiety   . Cancer     breast- right  . Arthritis   . Breast cancer   . Family history of ovarian cancer   . Family history of colon cancer   . Family history of pancreatic cancer     Past Surgical History  Procedure Laterality Date  . Abdominal hysterectomy    . Mastectomy Right   . Cataract extraction w/phaco Left 03/11/2013    Procedure: CATARACT EXTRACTION PHACO AND INTRAOCULAR LENS PLACEMENT (IOC);  Surgeon: Tonny Branch, MD;  Location: [facility];  Service: Ophthalmology;  Laterality: Left;  CDE:7.54  . Cataract extraction w/phaco Right 04/15/2013    Procedure: CATARACT EXTRACTION PHACO AND INTRAOCULAR LENS PLACEMENT (IOC);  Surgeon: Tonny Branch, MD;  Location: [facility];  Service: Ophthalmology;  Laterality: Right;  CDE 9.38  . Mastectomy modified radical Left 11/04/2013    Procedure: LEFT MASTECTOMY MODIFIED RADICAL;  Surgeon: Jamesetta So, MD;  Location: [facility];  Service: General;  Laterality: Left;  . Portacath placement Right 12/06/2013    Procedure: INSERTION PORT-A-CATH-Right subclavian;  Surgeon: Jamesetta So, MD;  Location: [facility];  Service: General;  Laterality:  Right;    There were no vitals filed for this visit.  Visit Diagnosis:  Fatigue due to treatment  Stiffness of hip joint, unspecified laterality  Midline low back pain without sciatica  Bilateral low back pain without sciatica  Midline low back pain with left-sided sciatica      Subjective Assessment - 05/12/14 0846    Subjective Pt states her back continues to have a nagging pain, 4/10 today and into Rt groin.   Currently in Pain? Yes   Pain Score 4    Pain Location Back   Pain Orientation Lower   Pain Radiating Towards Rt groin                         [facility] Adult PT Treatment/Exercise - 05/12/14 0847    Bed Mobility   Bed Mobility Rolling Right;Rolling Left   Rolling Right 5: Supervision   Rolling Left 5: Supervision   Lumbar Exercises: Stretches   Prone on Elbows Stretch Limitations 3'   Piriformis Stretch 1 rep;60 seconds;Limitations   Lumbar Exercises: Aerobic   Stationary Bike nustep level 3 x10 minutes, seat 7 65-70SPM   Lumbar Exercises: Standing   Other Standing Lumbar Exercises pick red ball off 14" step x 10    Other Standing Lumbar Exercises 3D hip excursion with therapist facilitation 10x   Lumbar Exercises: Seated   Sit to  Stand 10 reps   Lumbar Exercises: Prone   Straight Leg Raise 10 reps                  PT Short Term Goals - 05/09/14 0908    PT SHORT TERM GOAL #1   Title Pt to be I in HEP   Status Achieved   PT SHORT TERM GOAL #2   Title Pt to have stated that she has started a walking program at home   Status On-going           PT Long Term Goals - 05/09/14 0908    PT LONG TERM GOAL #1   Title I in advance HEP   Status On-going   PT LONG TERM GOAL #2   Title Pt to be walking for at lease 15 minutes a day 3x a week   Status On-going   PT LONG TERM GOAL #3   Title Pt to be able to come sit to stand without use of UE assist   Status On-going   PT LONG TERM GOAL #4   Title Pt to be able to SLS for 10  seconds for reduce risk of falling   PT LONG TERM GOAL #5   Title Pt TUG to be 12 or less to demonstrate decreased risk of falling    PT LONG TERM GOAL #6   Title FACIT-F to have improved by 25 points   Status On-going               Plan - 05-15-2014 0847    Clinical Impression Statement Gcode updated this visit.  Pt continues to have decreased hip mobility and hip/back rigidity.  PT able to pick plyoball off 14" step with supervision for instability.  Improved control with sit to stand today.  Able to complete nustep at EOS without increased aggrevation of LB musculature.     PT Next Visit Plan continue with lifting, gluteal strenghtening wth sit to stand, wall slides add tandem gt on foam for balance           G-Codes - 05/15/14 0945    Functional Assessment Tool Used clinickal judgement   Functional Limitation Mobility: Walking and moving around   Mobility: Walking and Moving Around Current Status ([id number]) At least 40 percent but less than 60 percent impaired, limited or restricted   Mobility: Walking and Moving Around Goal Status [phone]) At least 20 percent but less than 40 percent impaired, limited or restricted      Problem List Patient Active Problem List   Diagnosis Date Noted  . Genetic testing 04/29/2014  . Family history of ovarian cancer   . Family history of colon cancer   . Family history of pancreatic cancer   . Breast cancer 11/04/2013  . Low back pain 11/12/2012    Teena Irani, PTA/CLT [phone]  15-May-2014, 9:46 AM Rayetta Humphrey, PT CLT [place] [address] [place], [place], [address] Phone: [phone]   Fax:  [phone]

## 2014-05-14 ENCOUNTER — Ambulatory Visit (HOSPITAL_COMMUNITY): Payer: Medicare Other

## 2014-05-14 DIAGNOSIS — R5383 Other fatigue: Secondary | ICD-10-CM

## 2014-05-14 DIAGNOSIS — M545 Low back pain, unspecified: Secondary | ICD-10-CM

## 2014-05-14 DIAGNOSIS — M25659 Stiffness of unspecified hip, not elsewhere classified: Secondary | ICD-10-CM

## 2014-05-14 DIAGNOSIS — M5442 Lumbago with sciatica, left side: Secondary | ICD-10-CM

## 2014-05-14 NOTE — Therapy (Signed)
[place], [place], [address] Phone: [phone]   Fax:  [phone]  Physical Therapy Treatment  Patient Details  Name: Tiffany Sweeney MRN: [id number] Date of Birth: 1934-02-03 Referring Provider:  Patrici Ranks, MD  Encounter Date: 05/14/2014      PT End of Session - 05/14/14 0824    Visit Number 11   Number of Visits 12   Date for PT Re-Evaluation 05/19/14   Authorization Type medicare    Authorization Time Period Gcode complete 10th session   Authorization - Visit Number 11   Authorization - Number of Visits 20   PT Start Time 0801   PT Stop Time 0900   PT Time Calculation (min) 59 min   Equipment Utilized During Treatment Gait belt   Activity Tolerance Patient tolerated treatment well   Behavior During Therapy [facility] for tasks assessed/performed      Past Medical History  Diagnosis Date  . High cholesterol   . Hypertension   . Anxiety   . Cancer     breast- right  . Arthritis   . Breast cancer   . Family history of ovarian cancer   . Family history of colon cancer   . Family history of pancreatic cancer     Past Surgical History  Procedure Laterality Date  . Abdominal hysterectomy    . Mastectomy Right   . Cataract extraction w/phaco Left 03/11/2013    Procedure: CATARACT EXTRACTION PHACO AND INTRAOCULAR LENS PLACEMENT (IOC);  Surgeon: Tonny Branch, MD;  [facility]: [facility];  Service: Ophthalmology;  Laterality: Left;  CDE:7.54  . Cataract extraction w/phaco Right 04/15/2013    Procedure: CATARACT EXTRACTION PHACO AND INTRAOCULAR LENS PLACEMENT (IOC);  Surgeon: Tonny Branch, MD;  Location: [facility];  Service: Ophthalmology;  Laterality: Right;  CDE 9.38  . Mastectomy modified radical Left 11/04/2013    Procedure: LEFT MASTECTOMY MODIFIED RADICAL;  Surgeon: Jamesetta So, MD;  Location: [facility];  Service: General;  Laterality: Left;  . Portacath placement Right 12/06/2013    Procedure: INSERTION  PORT-A-CATH-Right subclavian;  Surgeon: Jamesetta So, MD;  Location: [facility];  Service: General;  Laterality: Right;    There were no vitals filed for this visit.  Visit Diagnosis:  Fatigue due to treatment  Stiffness of hip joint, unspecified laterality  Midline low back pain without sciatica  Bilateral low back pain without sciatica  Midline low back pain with left-sided sciatica      Subjective Assessment - 05/14/14 0807    Subjective Pt stated her lower back continues to have a nagging pain, 4/10 today.  Stated she felt really stiff today   Currently in Pain? Yes   Pain Score 4    Pain Location Back   Pain Orientation Lower   Pain Descriptors / Indicators Nagging            [facility] PT Assessment - 05/14/14 0001    Assessment   Medical Diagnosis fatigue/difficulty walking   Onset Date 12/03/13   Next MD Visit 06/05/2014   Prior Therapy none           [facility] Adult PT Treatment/Exercise - 05/14/14 0001    Bed Mobility   Bed Mobility Rolling Left;Left Sidelying to Sit   Rolling Left 5: Supervision   Left Sidelying to Sit 5: Supervision   Lumbar Exercises: Stretches   Prone on Elbows Stretch Limitations 3'   Press Ups 5 reps;Limitations   Press Ups Limitations 1/4  up    Lumbar Exercises: Aerobic   Stationary Bike nustep level 4 x10 minutes, seat 7 65-70SPM   Lumbar Exercises: Standing   Forward Lunge 10 reps   Forward Lunge Limitations 6in   Wall Slides 10 reps;5 seconds   Other Standing Lumbar Exercises pick red ball off 14" step x 10    Other Standing Lumbar Exercises 3D hip excursion with therapist facilitation 10x   Lumbar Exercises: Seated   Sit to Stand 10 reps   Sit to Stand Limitations 5 reps from mat, 5 reps from 17in with pillow   Lumbar Exercises: Prone   Straight Leg Raise 10 reps             Balance Exercises - 05/14/14 0854    Balance Exercises: Standing   Balance Beam tandem, retro and sidestepping 2RT             PT Short  Term Goals - 05/14/14 0826    PT SHORT TERM GOAL #1   Title Pt to be I in HEP   Status Achieved   PT SHORT TERM GOAL #2   Title Pt to have stated that she has started a walking program at home   Status On-going           PT Long Term Goals - 05/14/14 0826    PT LONG TERM GOAL #1   Title I in advance HEP   Status On-going   PT LONG TERM GOAL #2   Title Pt to be walking for at lease 15 minutes a day 3x a week   Status On-going   PT LONG TERM GOAL #3   Title Pt to be able to come sit to stand without use of UE assist   Status On-going   PT LONG TERM GOAL #4   Title Pt to be able to SLS for 10 seconds for reduce risk of falling   Status On-going   PT LONG TERM GOAL #5   Title Pt TUG to be 12 or less to demonstrate decreased risk of falling    PT LONG TERM GOAL #6   Title FACIT-F to have improved by 25 points               Plan - 05/14/14 0855    Clinical Impression Statement Pt contiues to have decreased hip mobilty and very rigid hip/back with c/o nagging pain through session.  Continued POE and press up exercises to stretch out lower back with reports of relief following.  Pt demonstrated improved control with sit to stand today, change it to 17in step with pillow on top to simulate standing from her couch.  Added forward lunges for gluteal and hamstring strengthening.   PT Next Visit Plan Reassess next session.  Continue with lifting, gluteal strenghtening wth sit to stand, wall slides add tandem gt on foam for balance         Problem List Patient Active Problem List   Diagnosis Date Noted  . Genetic testing 04/29/2014  . Family history of ovarian cancer   . Family history of colon cancer   . Family history of pancreatic cancer   . Breast cancer 11/04/2013  . Low back pain 11/12/2012   Ihor Austin, [place]; [place] [id number] [phone]  Aldona Lento 05/14/2014, 9:05 AM  [place], [place], [address] Phone: [phone]   Fax:  [phone]

## 2014-05-16 ENCOUNTER — Ambulatory Visit (HOSPITAL_COMMUNITY): Payer: Medicare Other | Admitting: Physical Therapy

## 2014-05-16 DIAGNOSIS — R5383 Other fatigue: Secondary | ICD-10-CM

## 2014-05-16 DIAGNOSIS — M545 Low back pain, unspecified: Secondary | ICD-10-CM

## 2014-05-16 DIAGNOSIS — M25659 Stiffness of unspecified hip, not elsewhere classified: Secondary | ICD-10-CM

## 2014-05-16 NOTE — Therapy (Signed)
[place], [place], [address] Phone: [phone]   Fax:  [phone]  Physical Therapy Treatment (Re-Assessment)  Patient Details  Name: Tiffany Sweeney MRN: [id number] Date of Birth: 05/16/1934 Referring Provider:  Patrici Ranks, MD  Encounter Date: 05/16/2014      PT End of Session - 05/16/14 1157    Visit Number 12   Number of Visits 24   Date for PT Re-Evaluation 06/13/14   Authorization Type medicare    Authorization Time Period Gcode complete 10th session   Authorization - Visit Number 12   Authorization - Number of Visits 20   PT Start Time 2122   PT Stop Time 0938   PT Time Calculation (min) 41 min   Activity Tolerance Patient tolerated treatment well   Behavior During Therapy [facility] for tasks assessed/performed     Noted that original cert was from 04/11/23 to 04/11/14; resent another cert for 0/0/37 to 0/4/88 today.   Past Medical History  Diagnosis Date  . High cholesterol   . Hypertension   . Anxiety   . Cancer     breast- right  . Arthritis   . Breast cancer   . Family history of ovarian cancer   . Family history of colon cancer   . Family history of pancreatic cancer     Past Surgical History  Procedure Laterality Date  . Abdominal hysterectomy    . Mastectomy Right   . Cataract extraction w/phaco Left 03/11/2013    Procedure: CATARACT EXTRACTION PHACO AND INTRAOCULAR LENS PLACEMENT (IOC);  Surgeon: Tonny Branch, MD;  [facility]: [facility];  Service: Ophthalmology;  Laterality: Left;  CDE:7.54  . Cataract extraction w/phaco Right 04/15/2013    Procedure: CATARACT EXTRACTION PHACO AND INTRAOCULAR LENS PLACEMENT (IOC);  Surgeon: Tonny Branch, MD;  Location: [facility];  Service: Ophthalmology;  Laterality: Right;  CDE 9.38  . Mastectomy modified radical Left 11/04/2013    Procedure: LEFT MASTECTOMY MODIFIED RADICAL;  Surgeon: Jamesetta So, MD;  Location: [facility];  Service: General;  Laterality: Left;   . Portacath placement Right 12/06/2013    Procedure: INSERTION PORT-A-CATH-Right subclavian;  Surgeon: Jamesetta So, MD;  Location: [facility];  Service: General;  Laterality: Right;    There were no vitals filed for this visit.  Visit Diagnosis:  Fatigue due to treatment - Plan: PT plan of care cert/re-cert  Stiffness of hip joint, unspecified laterality - Plan: PT plan of care cert/re-cert  Midline low back pain without sciatica - Plan: PT plan of care cert/re-cert      Subjective Assessment - 05/16/14 1151    Subjective Patient states she took some pain pills this morning and that her pain will soon be 0/10, decreasing. Still feeling very stiff.    Pertinent History Breast cancer; Low back pain   How long can you sit comfortably? 5/13- no limits    How long can you stand comfortably? 5/13- no limits    How long can you walk comfortably? 5/13- still not walking much at all    Patient Stated Goals increased energy    Currently in Pain? No/denies            [facility] PT Assessment - 05/16/14 0001    Assessment   Medical Diagnosis fatigue/difficulty walking   Onset Date 12/03/13   Next MD Visit 06/05/2014   Prior Therapy none   Precautions   Precautions None   Restrictions   Weight Bearing Restrictions No  Balance Screen   Has the patient fallen in the past 6 months No   Has the patient had a decrease in activity level because of a fear of falling?  Yes   Is the patient reluctant to leave their home because of a fear of falling?  Yes   [place] Private residence   Type of [place] Access Stairs to enter   Entrance Stairs-Number of Steps 5   Home Layout Laundry or work area in basement   Prior Function   Vocation Retired   Leisure reading, cooking, gardening    Observation/Other Assessments   Observations FACIT F TOI 67; FACT-G 81; FACIT-F total 111  Fatigue VAS 6/8/5; Pain VAS 4/6/4; Distress VAS 2/4/3   Functional Tests    Functional tests Sit to Stand   Sit to Stand   Comments 5xsit to stand without UEs 25.39 seconds    AROM   Right Hip External Rotation  40   Right Hip Internal Rotation  12   Left Hip External Rotation  30   Left Hip Internal Rotation  28   Lumbar Flexion 45   Lumbar Extension 9   Lumbar - Right Side Bend 28   Lumbar - Left Side Bend 22   Lumbar - Right Rotation remains decreased    Lumbar - Left Rotation remains decreased    Strength   Right Hip Flexion 2/5  limited due to pre-existing hip problem    Right Hip Extension 3-/5   Right Hip ABduction 4/5   Left Hip Flexion 4+/5   Left Hip Extension 3/5   Left Hip ABduction 3+/5   Right Knee Flexion 5/5   Right Knee Extension 4/5   Left Knee Flexion 4+/5   Left Knee Extension 4/5   Right Ankle Dorsiflexion 4-/5   Left Ankle Dorsiflexion 4/5   Bed Mobility   Bed Mobility Rolling Right;Rolling Left   Rolling Right 5: Supervision   Rolling Left 5: Supervision   Left Sidelying to Sit 5: Supervision   6 Minute Walk- Baseline   6 Minute Walk- Baseline --  approx 967ft in 6 minutes    Timed Up and Go Test   TUG Normal TUG   Normal TUG (seconds) --  13.87, 11.82. 11.11                             PT Education - 05/16/14 1156    Education provided Yes   Education Details education regarding progress with skilled PT services, plan of care moving forward; after patient confirmed that cancer has never been in the area of her low back, advised patient to attempt moist heat at home to assist in loosening low back    Person(s) Educated Patient   Methods Explanation   Comprehension Verbalized understanding          PT Short Term Goals - 05/16/14 1203    PT SHORT TERM GOAL #1   Title Pt to be I in HEP   Time 1   Status Achieved   PT SHORT TERM GOAL #2   Title Pt to have stated that she has started a walking program at home   Baseline 5/12- patient states she is limited in walking due to pre-existing pain  in her hips and feet    Time 2   Period Weeks   Status On-going   PT SHORT TERM GOAL #3  Title Patient will demonstrate the efficiency of her car transfers, will be able to perform with independence and no fatigue, no difficulty with transfer    Time 4   Period Weeks   Status New   PT SHORT TERM GOAL #4   Title Patient will demonstrate the ability to pick an object up from the floor with more ease secondary to have gained at least 15 degrees lumbar flexion    Time 4   Period Weeks   Status New   PT SHORT TERM GOAL #5   Title Patient will demonstrate the ability to ambulate at least 1477ft during 6 minute walk and will be able to tolerate at least 15 minutes of exericse on Nustep secondary to improved functional activity tolerance    Time 4   Period Weeks   Status New           PT Long Term Goals - 05/16/14 1204    PT LONG TERM GOAL #1   Title I in advance HEP   Time 4   Period Weeks   Status On-going   PT LONG TERM GOAL #2   Title Pt to be walking for at lease 15 minutes a day 3x a week   Baseline 5/12- states she walks around [place] but does not go out on the road    Time 4   Period Weeks   Status On-going   PT LONG TERM GOAL #3   Title Pt to be able to come sit to stand without use of UE assist   Time 4   Period Weeks   Status Achieved   PT LONG TERM GOAL #4   Title Pt to be able to SLS for 10 seconds for reduce risk of falling   Time 4   Period Weeks   Status Achieved   PT LONG TERM GOAL #5   Title Pt TUG to be 12 or less to demonstrate decreased risk of falling    Baseline 5/13- at best 11.11 seconds    Time 4   Period Weeks   Status Achieved   PT LONG TERM GOAL #6   Title FACIT-F to have improved by 25 points   Baseline 5/13- FACIT-F total score 111   Time 4   Period Weeks   Status On-going               Plan - 05/16/14 1158    Clinical Impression Statement Re-assessment performed today. Patient continues to demonstrate generalized  weakness, generalized fatigue, reduced hip and back mobility (although this may be in part due to pre-existing R hip injury), postural impairments, pain, reduced functional activity tolerance, gait deviations, and overall reduced functional task performance. The patient added some goals today, statting that she would like to be able to pick up items from the floor with more ease and would also like to have more energy in general; goals adjusted appropriately. Patient states that she is still not participating in regular walking program due to pain in her R hip and her feet. After confirming from patient that her cancer had never been in the area of her lumbar spine, advised her to try moist heat on her low back at home to assist in reducing lumbar stiffness. At this time patient will continue to benefit from skilled PT services, at approximately 3x/week for 4 more weeks, to address these deficits and to continue to assist her in reaching an optimal level of function.    Pt will  benefit from skilled therapeutic intervention in order to improve on the following deficits Decreased activity tolerance;Decreased balance;Decreased endurance;Decreased strength;Difficulty walking;Decreased range of motion   Rehab Potential Good   PT Frequency 3x / week   PT Duration 4 weeks   PT Treatment/Interventions Therapeutic activities;Therapeutic exercise;Balance training;Functional mobility training;Patient/family education;Manual techniques   PT Next Visit Plan Continue functional lifting, glut strength, wall sides, balance activities. Moist heat for low back at beginning of session. Functional activity exercises, possibly on elliptical or nustep to avoid excess stress on R hip and bilateral feet.    PT Home Exercise Plan given   Consulted and Agree with Plan of Care Patient        Problem List Patient Active Problem List   Diagnosis Date Noted  . Genetic testing 04/29/2014  . Family history of ovarian cancer   .  Family history of colon cancer   . Family history of pancreatic cancer   . Breast cancer 11/04/2013  . Low back pain 11/12/2012    Deniece Ree PT, DPT [place] [address] [place], [place], [address] Phone: [phone]   Fax:  [phone]

## 2014-05-19 ENCOUNTER — Ambulatory Visit (HOSPITAL_COMMUNITY): Payer: Medicare Other | Admitting: Physical Therapy

## 2014-05-19 DIAGNOSIS — M5441 Lumbago with sciatica, right side: Secondary | ICD-10-CM

## 2014-05-19 DIAGNOSIS — M545 Low back pain, unspecified: Secondary | ICD-10-CM

## 2014-05-19 DIAGNOSIS — R5383 Other fatigue: Secondary | ICD-10-CM

## 2014-05-19 DIAGNOSIS — M5442 Lumbago with sciatica, left side: Secondary | ICD-10-CM

## 2014-05-19 DIAGNOSIS — M25659 Stiffness of unspecified hip, not elsewhere classified: Secondary | ICD-10-CM

## 2014-05-19 NOTE — Therapy (Signed)
[place], [place], [address] Phone: [phone]   Fax:  [phone]  Physical Therapy Treatment  Patient Details  Name: Tiffany Sweeney MRN: [id number] Date of Birth: 12-23-1934 Referring Provider:  Patrici Ranks, MD  Encounter Date: 05/19/2014      PT End of Session - 05/19/14 1207    Visit Number 13   Number of Visits 24   Date for PT Re-Evaluation 06/13/14   Authorization Type medicare    Authorization Time Period Gcode complete 10th session   Authorization - Visit Number 13   Authorization - Number of Visits 20   PT Start Time [phone]   PT Stop Time 0935   PT Time Calculation (min) 42 min   Equipment Utilized During Treatment Gait belt   Activity Tolerance Patient tolerated treatment well      Past Medical History  Diagnosis Date  . High cholesterol   . Hypertension   . Anxiety   . Cancer     breast- right  . Arthritis   . Breast cancer   . Family history of ovarian cancer   . Family history of colon cancer   . Family history of pancreatic cancer     Past Surgical History  Procedure Laterality Date  . Abdominal hysterectomy    . Mastectomy Right   . Cataract extraction w/phaco Left 03/11/2013    Procedure: CATARACT EXTRACTION PHACO AND INTRAOCULAR LENS PLACEMENT (IOC);  Surgeon: Tonny Branch, MD;  Location: [facility];  Service: Ophthalmology;  Laterality: Left;  CDE:7.54  . Cataract extraction w/phaco Right 04/15/2013    Procedure: CATARACT EXTRACTION PHACO AND INTRAOCULAR LENS PLACEMENT (IOC);  Surgeon: Tonny Branch, MD;  Location: [facility];  Service: Ophthalmology;  Laterality: Right;  CDE 9.38  . Mastectomy modified radical Left 11/04/2013    Procedure: LEFT MASTECTOMY MODIFIED RADICAL;  Surgeon: Jamesetta So, MD;  Location: [facility];  Service: General;  Laterality: Left;  . Portacath placement Right 12/06/2013    Procedure: INSERTION PORT-A-CATH-Right subclavian;  Surgeon: Jamesetta So, MD;   Location: [facility];  Service: General;  Laterality: Right;    There were no vitals filed for this visit.  Visit Diagnosis:  Fatigue due to treatment  Stiffness of hip joint, unspecified laterality  Midline low back pain without sciatica  Bilateral low back pain without sciatica  Midline low back pain with left-sided sciatica  Right-sided low back pain with right-sided sciatica      Subjective Assessment - 05/19/14 0928    Subjective Pt states that her back always bothers her a little bit.  Getting shoes and socks is slightly easier    Currently in Pain? Yes   Pain Score 3    Pain Location Back   Pain Orientation Lower   Pain Descriptors / Indicators Aching   Pain Type Chronic pain              [facility] Adult PT Treatment/Exercise - 05/19/14 0001    Modalities   Modalities Moist Heat   Moist Heat Therapy   Number Minutes Moist Heat 10 Minutes   Moist Heat Location Lumbar Spine             Balance Exercises - 05/19/14 0929    Balance Exercises: Standing   SLS Eyes open;Other reps (comment)  x10   Balance Beam tandem over hurdles, sidestep over hurdles x 2    Sit to Stand Time x5   Other Standing Exercises steps x  3 RT   Lunging with foot on 3rd step x 10 B    Balance Exercises: Seated   Other Seated Exercises nustep hills 3; level 4 x 10:00 with HMP       marching x 10         PT Short Term Goals - 05/16/14 1203    PT SHORT TERM GOAL #1   Title Pt to be I in HEP   Time 1   Status Achieved   PT SHORT TERM GOAL #2   Title Pt to have stated that she has started a walking program at home   Baseline 5/12- patient states she is limited in walking due to pre-existing pain in her hips and feet    Time 2   Period Weeks   Status On-going   PT SHORT TERM GOAL #3   Title Patient will demonstrate the efficiency of her car transfers, will be able to perform with independence and no fatigue, no difficulty with transfer    Time 4   Period Weeks   Status New    PT SHORT TERM GOAL #4   Title Patient will demonstrate the ability to pick an object up from the floor with more ease secondary to have gained at least 15 degrees lumbar flexion    Time 4   Period Weeks   Status New   PT SHORT TERM GOAL #5   Title Patient will demonstrate the ability to ambulate at least 1431ft during 6 minute walk and will be able to tolerate at least 15 minutes of exericse on Nustep secondary to improved functional activity tolerance    Time 4   Period Weeks   Status New           PT Long Term Goals - 05/16/14 1204    PT LONG TERM GOAL #1   Title I in advance HEP   Time 4   Period Weeks   Status On-going   PT LONG TERM GOAL #2   Title Pt to be walking for at lease 15 minutes a day 3x a week   Baseline 5/12- states she walks around [place] but does not go out on the road    Time 4   Period Weeks   Status On-going   PT LONG TERM GOAL #3   Title Pt to be able to come sit to stand without use of UE assist   Time 4   Period Weeks   Status Achieved   PT LONG TERM GOAL #4   Title Pt to be able to SLS for 10 seconds for reduce risk of falling   Time 4   Period Weeks   Status Achieved   PT LONG TERM GOAL #5   Title Pt TUG to be 12 or less to demonstrate decreased risk of falling    Baseline 5/13- at best 11.11 seconds    Time 4   Period Weeks   Status Achieved   PT LONG TERM GOAL #6   Title FACIT-F to have improved by 25 points   Baseline 5/13- FACIT-F total score 111   Time 4   Period Weeks   Status On-going               Plan - 05/19/14 1208    Clinical Impression Statement Completed highter balance activities to continue to challenge pt.  All exercises completed with therapist facilitation to ensure pt safety.  Added HMP to lumbar region while on nustep to attempt  to decrease lumbar discomfort.     PT Next Visit Plan assess how [place] did for pt.  Progress from 4" to 7" stair climbing.         Problem List Patient Active  Problem List   Diagnosis Date Noted  . Genetic testing 04/29/2014  . Family history of ovarian cancer   . Family history of colon cancer   . Family history of pancreatic cancer   . Breast cancer 11/04/2013  . Low back pain 11/12/2012    Rayetta Humphrey, PT CLT [phone] 05/19/2014, 12:11 PM  [place] [address] [place], [place], [address] Phone: [phone]   Fax:  [phone]

## 2014-05-21 ENCOUNTER — Ambulatory Visit (HOSPITAL_COMMUNITY): Payer: Medicare Other

## 2014-05-21 DIAGNOSIS — M5442 Lumbago with sciatica, left side: Secondary | ICD-10-CM

## 2014-05-21 DIAGNOSIS — M5441 Lumbago with sciatica, right side: Secondary | ICD-10-CM

## 2014-05-21 DIAGNOSIS — R5383 Other fatigue: Secondary | ICD-10-CM

## 2014-05-21 DIAGNOSIS — M545 Low back pain, unspecified: Secondary | ICD-10-CM

## 2014-05-21 DIAGNOSIS — M25659 Stiffness of unspecified hip, not elsewhere classified: Secondary | ICD-10-CM

## 2014-05-21 NOTE — Therapy (Signed)
Tiffany Sweeney, [place], [address] Phone: [phone]   Fax:  [phone]  Physical Therapy Treatment  Patient Details  Name: Tiffany Sweeney MRN: [id number] Date of Birth: January 11, 1934 Referring Provider:  Patrici Ranks, MD  Encounter Date: 05/21/2014      PT End of Session - 05/21/14 0905    Visit Number 14   Number of Visits 24   Date for PT Re-Evaluation 06/13/14   Authorization Type medicare    Authorization Time Period Gcode complete 10th session   Authorization - Visit Number 14   Authorization - Number of Visits 20   PT Start Time 0802   PT Stop Time 0900   PT Time Calculation (min) 58 min   Equipment Utilized During Treatment Gait belt   Activity Tolerance Patient tolerated treatment well   Behavior During Therapy [facility] for tasks assessed/performed      Past Medical History  Diagnosis Date  . High cholesterol   . Hypertension   . Anxiety   . Cancer     breast- right  . Arthritis   . Breast cancer   . Family history of ovarian cancer   . Family history of colon cancer   . Family history of pancreatic cancer     Past Surgical History  Procedure Laterality Date  . Abdominal hysterectomy    . Mastectomy Right   . Cataract extraction w/phaco Left 03/11/2013    Procedure: CATARACT EXTRACTION PHACO AND INTRAOCULAR LENS PLACEMENT (IOC);  Surgeon: Tonny Branch, MD;  Location: [facility];  Service: Ophthalmology;  Laterality: Left;  CDE:7.54  . Cataract extraction w/phaco Right 04/15/2013    Procedure: CATARACT EXTRACTION PHACO AND INTRAOCULAR LENS PLACEMENT (IOC);  Surgeon: Tonny Branch, MD;  Location: [facility];  Service: Ophthalmology;  Laterality: Right;  CDE 9.38  . Mastectomy modified radical Left 11/04/2013    Procedure: LEFT MASTECTOMY MODIFIED RADICAL;  Surgeon: Jamesetta So, MD;  Location: [facility];  Service: General;  Laterality: Left;  . Portacath placement Right 12/06/2013    Procedure: INSERTION  PORT-A-CATH-Right subclavian;  Surgeon: Jamesetta So, MD;  Location: [facility];  Service: General;  Laterality: Right;    There were no vitals filed for this visit.  Visit Diagnosis:  Stiffness of hip joint, unspecified laterality  Midline low back pain without sciatica  Bilateral low back pain without sciatica  Midline low back pain with left-sided sciatica  Right-sided low back pain with right-sided sciatica  Fatigue due to treatment      Subjective Assessment - 05/21/14 0808    Subjective Pt stated the MHP helped last session.  Reported her Rt groin area hurts this morning, stated she did several exercises last night.    Currently pain free   Currently in Pain? No/denies            [facility] PT Assessment - 05/21/14 0001    Assessment   Medical Diagnosis fatigue/difficulty walking   Onset Date 12/03/13   Next MD Visit [place] 07/01/2014   Prior Therapy none              [facility] Adult PT Treatment/Exercise - 05/21/14 0001    Lumbar Exercises: Aerobic   Stationary Bike nustep level 4 x10 minutes, seat 7 65-70SPM with MHP for LBP   Knee/Hip Exercises: Standing   Forward Lunges Both;1 set;10 reps   Forward Lunges Limitations 4 inch step    Side Lunges Both;1 set;10 reps   Side Lunges Limitations  4 inch box    Stairs 1RT 1HR 4 in reciprocal pattern; 7in 4RT    Rocker Board 2 minutes  R/L and A/P no HHA   Moist Heat Therapy   Number Minutes Moist Heat 10 Minutes   Moist Heat Location Lumbar Spine  on Nustep          Balance Exercises - 05/21/14 0836    Balance Exercises: Standing   SLS Eyes open;3 reps  Rt 40", LT 42" max of 3   Balance Beam tandem over hurdles, sidestep over hurdles x 2              PT Short Term Goals - 05/21/14 0905    PT SHORT TERM GOAL #1   Title Pt to be I in HEP   Status Achieved   PT SHORT TERM GOAL #2   Title Pt to have stated that she has started a walking program at home   Status On-going   PT [place] #3    Title Patient will demonstrate the efficiency of her car transfers, will be able to perform with independence and no fatigue, no difficulty with transfer    Status On-going   PT SHORT TERM GOAL #4   Title Patient will demonstrate the ability to pick an object up from the floor with more ease secondary to have gained at least 15 degrees lumbar flexion    Status On-going   PT SHORT TERM GOAL #5   Title Patient will demonstrate the ability to ambulate at least 1434ft during 6 minute walk and will be able to tolerate at least 15 minutes of exericse on Nustep secondary to improved functional activity tolerance    Status On-going           PT Long Term Goals - 05/21/14 0906    PT LONG TERM GOAL #1   Title I in advance HEP   PT LONG TERM GOAL #2   Title Pt to be walking for at lease 15 minutes a day 3x a week   Status On-going   PT LONG TERM GOAL #3   Title Pt to be able to come sit to stand without use of UE assist   Status On-going   PT LONG TERM GOAL #4   Title Pt to be able to SLS for 10 seconds for reduce risk of falling   PT LONG TERM GOAL #5   Title Pt TUG to be 12 or less to demonstrate decreased risk of falling    Status Achieved   PT LONG TERM GOAL #6   Title FACIT-F to have improved by 25 points   Status On-going               Plan - 05/21/14 0911    Clinical Impression Statement Noted decreased Rt LE stance phase with gait, added rockerboard to improve weight distribution with gait without HHA to challenge balance.  Continued balance training of dynamic surfaces.  Progressed functional strengthening with increased step height with noted apprehension with Rt LE flexion.  Pt continues to have difficulty with proper lifting, therapist facilitation for proper mechanics.     PT Next Visit Plan Continue with current PT POC.  Stair training, lunges, balance activities, activity tolerance for walking.  MHP PRN for LBP on Nustep.        Problem List Patient Active  Problem List   Diagnosis Date Noted  . Genetic testing 04/29/2014  . Family history of ovarian cancer   .  Family history of colon cancer   . Family history of pancreatic cancer   . Breast cancer 11/04/2013  . Low back pain 11/12/2012   Ihor Austin, [place]; [place] #[address] [phone]  Aldona Lento 05/21/2014, 9:30 AM  [place], [place], [address] Phone: [phone]   Fax:  [phone]

## 2014-05-23 ENCOUNTER — Ambulatory Visit (HOSPITAL_COMMUNITY): Payer: Medicare Other

## 2014-05-23 DIAGNOSIS — R5383 Other fatigue: Secondary | ICD-10-CM

## 2014-05-23 DIAGNOSIS — M5442 Lumbago with sciatica, left side: Secondary | ICD-10-CM

## 2014-05-23 DIAGNOSIS — M25659 Stiffness of unspecified hip, not elsewhere classified: Secondary | ICD-10-CM

## 2014-05-23 DIAGNOSIS — M545 Low back pain, unspecified: Secondary | ICD-10-CM

## 2014-05-23 DIAGNOSIS — M5441 Lumbago with sciatica, right side: Secondary | ICD-10-CM

## 2014-05-23 NOTE — Therapy (Signed)
[place], [place], [address] Phone: [phone]   Fax:  [phone]  Physical Therapy Treatment  Patient Details  Name: Tiffany Sweeney MRN: [id number] Date of Birth: Sep 16, 1934 Referring Provider:  Patrici Ranks, MD  Encounter Date: 05/23/2014      PT End of Session - 05/23/14 0845    Visit Number 15   Number of Visits 24   Date for PT Re-Evaluation 06/13/14   Authorization Type medicare    Authorization Time Period Gcode complete 10th session   Authorization - Visit Number 15   Authorization - Number of Visits 20   PT Start Time 0801   PT Stop Time 0908   PT Time Calculation (min) 67 min   Activity Tolerance Patient tolerated treatment well   Behavior During Therapy [facility] for tasks assessed/performed      Past Medical History  Diagnosis Date  . High cholesterol   . Hypertension   . Anxiety   . Cancer     breast- right  . Arthritis   . Breast cancer   . Family history of ovarian cancer   . Family history of colon cancer   . Family history of pancreatic cancer     Past Surgical History  Procedure Laterality Date  . Abdominal hysterectomy    . Mastectomy Right   . Cataract extraction w/phaco Left 03/11/2013    Procedure: CATARACT EXTRACTION PHACO AND INTRAOCULAR LENS PLACEMENT (IOC);  Surgeon: Tonny Branch, MD;  Location: [facility];  Service: Ophthalmology;  Laterality: Left;  CDE:7.54  . Cataract extraction w/phaco Right 04/15/2013    Procedure: CATARACT EXTRACTION PHACO AND INTRAOCULAR LENS PLACEMENT (IOC);  Surgeon: Tonny Branch, MD;  Location: [facility];  Service: Ophthalmology;  Laterality: Right;  CDE 9.38  . Mastectomy modified radical Left 11/04/2013    Procedure: LEFT MASTECTOMY MODIFIED RADICAL;  Surgeon: Jamesetta So, MD;  Location: [facility];  Service: General;  Laterality: Left;  . Portacath placement Right 12/06/2013    Procedure: INSERTION PORT-A-CATH-Right subclavian;  Surgeon: Jamesetta So, MD;  Location: [facility];  Service: General;  Laterality: Right;    There were no vitals filed for this visit.  Visit Diagnosis:  Stiffness of hip joint, unspecified laterality  Midline low back pain without sciatica  Bilateral low back pain without sciatica  Midline low back pain with left-sided sciatica  Right-sided low back pain with right-sided sciatica  Fatigue due to treatment      Subjective Assessment - 05/23/14 0805    Subjective Pt entered dept stated Lt ankle increased swelling today.     Currently in Pain? Yes   Pain Score 4    Pain Location Groin   Pain Orientation Right   Pain Descriptors / Indicators Aching           [facility] Adult PT Treatment/Exercise - 05/23/14 0001    Bed Mobility   Bed Mobility Rolling Right;Rolling Left;Left Sidelying to Sit   Rolling Right 5: Supervision   Rolling Right Details (indicate cue type and reason) 5 reps   Rolling Left 5: Supervision   Rolling Left Details (indicate cue type and reason) 5x   Lumbar Exercises: Stretches   Active Hamstring Stretch 3 reps;30 seconds   Active Hamstring Stretch Limitations Manual stretches supine position   Single Knee to Chest Stretch 5 reps;10 seconds   Lower Trunk Rotation 5 reps;10 seconds   Prone on Elbows Stretch Limitations 3'   Press Ups 5 reps;Limitations  Press Ups Limitations 1/4 up    Piriformis Stretch 3 reps;30 seconds   Piriformis Stretch Limitations manual supine   Lumbar Exercises: Standing   Other Standing Lumbar Exercises 3D hip excursion with therapist facilitation 10x   Lumbar Exercises: Seated   Other Seated Lumbar Exercises 3D thoracic excursion   Lumbar Exercises: Supine   Other Supine Lumbar Exercises ankle pumps 20x with LE elevated    Lumbar Exercises: Prone   Straight Leg Raise 10 reps;3 seconds   Knee/Hip Exercises: Supine   Other Supine Knee Exercises --   Manual Therapy   Manual Therapy Edema management   Edema Management Retro massage to Lt LE  to reduce edema Lt ankle with elevation              PT Short Term Goals - 05/23/14 0850    PT SHORT TERM GOAL #1   Title Pt to be I in HEP   Status Achieved   PT SHORT TERM GOAL #2   Title Pt to have stated that she has started a walking program at home   Status On-going   PT [place] #3   Title Patient will demonstrate the efficiency of her car transfers, will be able to perform with independence and no fatigue, no difficulty with transfer    Status On-going   PT SHORT TERM GOAL #4   Title Patient will demonstrate the ability to pick an object up from the floor with more ease secondary to have gained at least 15 degrees lumbar flexion    PT SHORT TERM GOAL #5   Title Patient will demonstrate the ability to ambulate at least 1495ft during 6 minute walk and will be able to tolerate at least 15 minutes of exericse on Nustep secondary to improved functional activity tolerance            PT Long Term Goals - 05/23/14 2035    PT LONG TERM GOAL #1   Title I in advance HEP   Status On-going   PT LONG TERM GOAL #2   Title Pt to be walking for at lease 15 minutes a day 3x a week   Status On-going   PT LONG TERM GOAL #3   Title Pt to be able to come sit to stand without use of UE assist   Status On-going   PT LONG TERM GOAL #4   Title Pt to be able to SLS for 10 seconds for reduce risk of falling   PT LONG TERM GOAL #5   Title Pt TUG to be 12 or less to demonstrate decreased risk of falling    PT LONG TERM GOAL #6   Title FACIT-F to have improved by 25 points               Plan - 05/23/14 0845    Clinical Impression Statement Held standing exercises due to increased edema Lt ankle.  Session focus on improved spinal mobilty with excursion exercises and manual stretches to reduce tightness to improve functional tasks like putting on socks and shoes.  Pt reported difficutly raising UE, added thoracic excursion to improve UE ROM and spinal rotation to improve bed  mobiltiy and gait mechanics. Manual techniques complete to reduce edema Lt ankle.  End of session pt reported pain free with noted increased mobilty and improved gait mechanics.   PT Next Visit Plan Continue with current PT POC.  Continue mobility exercises and stretches for increased functional tasks like putting on socks and  shoes and gluteal strenghtening.  Stair training, lunges, balance activities, activity tolerance for walking.  MHP PRN for LBP on Nustep.        Problem List Patient Active Problem List   Diagnosis Date Noted  . Genetic testing 04/29/2014  . Family history of ovarian cancer   . Family history of colon cancer   . Family history of pancreatic cancer   . Breast cancer 11/04/2013  . Low back pain 11/12/2012   Ihor Austin, [place]; [place] #[address] [phone]  Aldona Lento 05/23/2014, 9:21 AM  [place] [address] [place], [place], [address] Phone: [phone]   Fax:  [phone]

## 2014-05-26 ENCOUNTER — Encounter (HOSPITAL_COMMUNITY): Payer: Medicare Other

## 2014-05-26 ENCOUNTER — Ambulatory Visit (HOSPITAL_COMMUNITY): Payer: Medicare Other | Admitting: Physical Therapy

## 2014-05-26 DIAGNOSIS — M25659 Stiffness of unspecified hip, not elsewhere classified: Secondary | ICD-10-CM

## 2014-05-26 DIAGNOSIS — M5442 Lumbago with sciatica, left side: Secondary | ICD-10-CM

## 2014-05-26 DIAGNOSIS — M5441 Lumbago with sciatica, right side: Secondary | ICD-10-CM

## 2014-05-26 DIAGNOSIS — M545 Low back pain, unspecified: Secondary | ICD-10-CM

## 2014-05-26 DIAGNOSIS — R5383 Other fatigue: Secondary | ICD-10-CM

## 2014-05-26 NOTE — Therapy (Signed)
[place], [place], [address] Phone: [phone]   Fax:  [phone]  Physical Therapy Treatment  Patient Details  Name: Tiffany Sweeney MRN: [id number] Date of Birth: 1934-10-07 Referring Provider:  Patrici Ranks, MD  Encounter Date: 05/26/2014      PT End of Session - 05/26/14 0851    Visit Number 16   Number of Visits 24   Date for PT Re-Evaluation 06/13/14   Authorization Type medicare    Authorization Time Period Gcode complete 10th session   Authorization - Visit Number 16   Authorization - Number of Visits 20   PT Start Time 0803   PT Stop Time 8003   PT Time Calculation (min) 52 min   Activity Tolerance Patient tolerated treatment well   Behavior During Therapy [facility] for tasks assessed/performed      Past Medical History  Diagnosis Date  . High cholesterol   . Hypertension   . Anxiety   . Cancer     breast- right  . Arthritis   . Breast cancer   . Family history of ovarian cancer   . Family history of colon cancer   . Family history of pancreatic cancer     Past Surgical History  Procedure Laterality Date  . Abdominal hysterectomy    . Mastectomy Right   . Cataract extraction w/phaco Left 03/11/2013    Procedure: CATARACT EXTRACTION PHACO AND INTRAOCULAR LENS PLACEMENT (IOC);  Surgeon: Tonny Branch, MD;  Location: [facility];  Service: Ophthalmology;  Laterality: Left;  CDE:7.54  . Cataract extraction w/phaco Right 04/15/2013    Procedure: CATARACT EXTRACTION PHACO AND INTRAOCULAR LENS PLACEMENT (IOC);  Surgeon: Tonny Branch, MD;  Location: [facility];  Service: Ophthalmology;  Laterality: Right;  CDE 9.38  . Mastectomy modified radical Left 11/04/2013    Procedure: LEFT MASTECTOMY MODIFIED RADICAL;  Surgeon: Jamesetta So, MD;  Location: [facility];  Service: General;  Laterality: Left;  . Portacath placement Right 12/06/2013    Procedure: INSERTION PORT-A-CATH-Right subclavian;  Surgeon: Jamesetta So, MD;  Location: [facility];  Service: General;  Laterality: Right;    There were no vitals filed for this visit.  Visit Diagnosis:  Stiffness of hip joint, unspecified laterality  Midline low back pain without sciatica  Bilateral low back pain without sciatica  Midline low back pain with left-sided sciatica  Right-sided low back pain with right-sided sciatica  Fatigue due to treatment                       [facility] Adult PT Treatment/Exercise - 05/26/14 0810    Lumbar Exercises: Stretches   Active Hamstring Stretch 3 reps;30 seconds   Active Hamstring Stretch Limitations Manual stretches supine position   Lumbar Exercises: Aerobic   Stationary Bike nustep level 4 x10 minutes, seat 7 65-70SPM with MHP for LBP   Lumbar Exercises: Standing   Other Standing Lumbar Exercises 3D hip excursion with therapist facilitation 10x   Lumbar Exercises: Seated   Other Seated Lumbar Exercises 3D thoracic excursion   Knee/Hip Exercises: Standing   Forward Lunges Both;1 set;10 reps   Forward Lunges Limitations 4 inch step    Side Lunges Both;1 set;10 reps   Side Lunges Limitations 4 inch box    Stairs 3RT 7" reciprocally   Modalities   Modalities Moist Heat   Moist Heat Therapy   Number Minutes Moist Heat 10 Minutes   Moist Heat Location Lumbar  Spine  while on nustep   Manual Therapy   Manual Therapy Edema management   Edema Management Retro massage to Lt LE to reduce edema Lt ankle with elevation                  PT Short Term Goals - 05/23/14 0850    PT SHORT TERM GOAL #1   Title Pt to be I in HEP   Status Achieved   PT SHORT TERM GOAL #2   Title Pt to have stated that she has started a walking program at home   Status On-going   PT [place] #3   Title Patient will demonstrate the efficiency of her car transfers, will be able to perform with independence and no fatigue, no difficulty with transfer    Status On-going   PT SHORT TERM GOAL #4    Title Patient will demonstrate the ability to pick an object up from the floor with more ease secondary to have gained at least 15 degrees lumbar flexion    PT SHORT TERM GOAL #5   Title Patient will demonstrate the ability to ambulate at least 1430ft during 6 minute walk and will be able to tolerate at least 15 minutes of exericse on Nustep secondary to improved functional activity tolerance            PT Long Term Goals - 05/23/14 0852    PT LONG TERM GOAL #1   Title I in advance HEP   Status On-going   PT LONG TERM GOAL #2   Title Pt to be walking for at lease 15 minutes a day 3x a week   Status On-going   PT LONG TERM GOAL #3   Title Pt to be able to come sit to stand without use of UE assist   Status On-going   PT LONG TERM GOAL #4   Title Pt to be able to SLS for 10 seconds for reduce risk of falling   PT LONG TERM GOAL #5   Title Pt TUG to be 12 or less to demonstrate decreased risk of falling    PT LONG TERM GOAL #6   Title FACIT-F to have improved by 25 points               Plan - 05/26/14 3329    Clinical Impression Statement Resumed standing exericses today. PT able to complete slowly but without increased pain reported.  Pt requires VC's and therapist facilitation to complete in correct form. Continued with manual to decrease edema in Lt ankle.  finished with nustep/moist heat.  Noted redution in swelling of Lt ankle at EOS.   PT Next Visit Plan Continue with current PT POC.  Continue mobility exercises and stretches for increased functional tasks like putting on socks and shoes and gluteal strenghtening.  Stair training, lunges, balance activities, activity tolerance for walking.  MHP PRN for LBP on Nustep.        Problem List Patient Active Problem List   Diagnosis Date Noted  . Genetic testing 04/29/2014  . Family history of ovarian cancer   . Family history of colon cancer   . Family history of pancreatic cancer   . Breast cancer 11/04/2013  . Low  back pain 11/12/2012    Teena Irani, PTA/CLT [phone] 05/26/2014, 9:10 AM  [place], [place], [address] Phone: [phone]   Fax:  [phone]

## 2014-05-28 ENCOUNTER — Ambulatory Visit (HOSPITAL_COMMUNITY): Payer: Medicare Other

## 2014-05-28 DIAGNOSIS — M25659 Stiffness of unspecified hip, not elsewhere classified: Secondary | ICD-10-CM

## 2014-05-28 DIAGNOSIS — M545 Low back pain, unspecified: Secondary | ICD-10-CM

## 2014-05-28 DIAGNOSIS — R5383 Other fatigue: Secondary | ICD-10-CM

## 2014-05-28 DIAGNOSIS — M5441 Lumbago with sciatica, right side: Secondary | ICD-10-CM

## 2014-05-28 DIAGNOSIS — M5442 Lumbago with sciatica, left side: Secondary | ICD-10-CM

## 2014-05-28 NOTE — Therapy (Signed)
[place], [place], [address] Phone: [phone]   Fax:  [phone]  Physical Therapy Treatment  Patient Details  Name: Tiffany Sweeney MRN: [id number] Date of Birth: Dec 14, 1934 Referring Provider:  Patrici Ranks, MD  Encounter Date: 05/28/2014      PT End of Session - 05/28/14 0807    Visit Number 17   Number of Visits 24   Date for PT Re-Evaluation 06/13/14   Authorization Type medicare    Authorization Time Period Gcode complete 10th session   Authorization - Visit Number 17   Authorization - Number of Visits 20   PT Start Time 0803   PT Stop Time 0858   PT Time Calculation (min) 55 min   Activity Tolerance Patient tolerated treatment well   Behavior During Therapy [facility] for tasks assessed/performed      Past Medical History  Diagnosis Date  . High cholesterol   . Hypertension   . Anxiety   . Cancer     breast- right  . Arthritis   . Breast cancer   . Family history of ovarian cancer   . Family history of colon cancer   . Family history of pancreatic cancer     Past Surgical History  Procedure Laterality Date  . Abdominal hysterectomy    . Mastectomy Right   . Cataract extraction w/phaco Left 03/11/2013    Procedure: CATARACT EXTRACTION PHACO AND INTRAOCULAR LENS PLACEMENT (IOC);  Surgeon: Tonny Branch, MD;  Location: [facility];  Service: Ophthalmology;  Laterality: Left;  CDE:7.54  . Cataract extraction w/phaco Right 04/15/2013    Procedure: CATARACT EXTRACTION PHACO AND INTRAOCULAR LENS PLACEMENT (IOC);  Surgeon: Tonny Branch, MD;  Location: [facility];  Service: Ophthalmology;  Laterality: Right;  CDE 9.38  . Mastectomy modified radical Left 11/04/2013    Procedure: LEFT MASTECTOMY MODIFIED RADICAL;  Surgeon: Jamesetta So, MD;  Location: [facility];  Service: General;  Laterality: Left;  . Portacath placement Right 12/06/2013    Procedure: INSERTION PORT-A-CATH-Right subclavian;  Surgeon: Jamesetta So, MD;  Location: [facility];  Service: General;  Laterality: Right;    There were no vitals filed for this visit.  Visit Diagnosis:  Stiffness of hip joint, unspecified laterality  Midline low back pain without sciatica  Bilateral low back pain without sciatica  Midline low back pain with left-sided sciatica  Right-sided low back pain with right-sided sciatica  Fatigue due to treatment      Subjective Assessment - 05/28/14 0800    Subjective Pt entered dept stating she felt good today   Currently in Pain? No/denies            [facility] Adult PT Treatment/Exercise - 05/28/14 0001    Lumbar Exercises: Stretches   Active Hamstring Stretch 3 reps;30 seconds   Active Hamstring Stretch Limitations Manual stretches supine position   Piriformis Stretch 3 reps;30 seconds   Piriformis Stretch Limitations seated with HHA to increase ease with putting on socks/shoes   Lumbar Exercises: Aerobic   Stationary Bike nustep level 4 x10 minutes, seat 7 65-70SPM with MHP for LBP   Lumbar Exercises: Standing   Forward Lunge 10 reps   Forward Lunge Limitations 4in step   Side Lunge 10 reps   Side Lunge Limitations 4in step   Other Standing Lumbar Exercises 3D hip excursion with therapist facilitation 10x   Lumbar Exercises: Seated   Other Seated Lumbar Exercises 3D thoracic excursion   Modalities  Modalities Moist Heat   Moist Heat Therapy   Number Minutes Moist Heat 10 Minutes   Moist Heat Location Lumbar Spine  During Nustep   Manual Therapy   Manual Therapy Edema management   Edema Management Retro massage to Lt LE to reduce edema Lt ankle with elevation            PT Short Term Goals - 05/28/14 7026    PT SHORT TERM GOAL #1   Title Pt to be I in HEP   Status Achieved   PT SHORT TERM GOAL #2   Title Pt to have stated that she has started a walking program at home   Status On-going   PT [place] #3   Title Patient will demonstrate the efficiency of her car  transfers, will be able to perform with independence and no fatigue, no difficulty with transfer    Status On-going   PT SHORT TERM GOAL #4   Title Patient will demonstrate the ability to pick an object up from the floor with more ease secondary to have gained at least 15 degrees lumbar flexion    Status On-going   PT SHORT TERM GOAL #5   Title Patient will demonstrate the ability to ambulate at least 1417ft during 6 minute walk and will be able to tolerate at least 15 minutes of exericse on Nustep secondary to improved functional activity tolerance    Status On-going           PT Long Term Goals - 05/28/14 0809    PT LONG TERM GOAL #1   Title I in advance HEP   Status On-going   PT LONG TERM GOAL #2   Title Pt to be walking for at lease 15 minutes a day 3x a week   Status On-going   PT LONG TERM GOAL #3   Title Pt to be able to come sit to stand without use of UE assist   Status On-going   PT LONG TERM GOAL #4   Title Pt to be able to SLS for 10 seconds for reduce risk of falling   Status Achieved   PT LONG TERM GOAL #5   Title Pt TUG to be 12 or less to demonstrate decreased risk of falling    Status Achieved   PT LONG TERM GOAL #6   Title FACIT-F to have improved by 25 points   Status On-going               Plan - 05/28/14 0819    Clinical Impression Statement Session focus on improving spinal mobility with excursion exercises to increase ease with functional activities like putting on socks and shoes.  Pt able to cross legs this session with hand held assistance and moderate difficulty.  Continued manual retro massage to decrease edema in Lt ankle.  Ended session with Nustep with MHP for LBP per request.  No reports of pain through session.   PT Next Visit Plan Continue with current PT POC.  Continue mobility exercises and stretches for increased functional tasks like putting on socks and shoes and gluteal strenghtening.  Stair training, lunges, balance activities,  activity tolerance for walking.  MHP PRN for LBP on Nustep.        Problem List Patient Active Problem List   Diagnosis Date Noted  . Genetic testing 04/29/2014  . Family history of ovarian cancer   . Family history of colon cancer   . Family history of pancreatic cancer   .  Breast cancer 11/04/2013  . Low back pain 11/12/2012   Ihor Austin, [place]; [place] #[id number] [phone]  Aldona Lento 05/28/2014, 9:47 AM  [place], [place], [address] Phone: [phone]   Fax:  [phone]

## 2014-05-30 ENCOUNTER — Ambulatory Visit (HOSPITAL_COMMUNITY): Payer: Medicare Other

## 2014-05-30 DIAGNOSIS — M545 Low back pain, unspecified: Secondary | ICD-10-CM

## 2014-05-30 DIAGNOSIS — M25659 Stiffness of unspecified hip, not elsewhere classified: Secondary | ICD-10-CM

## 2014-05-30 DIAGNOSIS — M5441 Lumbago with sciatica, right side: Secondary | ICD-10-CM

## 2014-05-30 DIAGNOSIS — R5383 Other fatigue: Secondary | ICD-10-CM

## 2014-05-30 DIAGNOSIS — M5442 Lumbago with sciatica, left side: Secondary | ICD-10-CM

## 2014-05-30 NOTE — Therapy (Signed)
[place], [place], [address] Phone: [phone]   Fax:  [phone]  Physical Therapy Treatment  Patient Details  Name: Tiffany Sweeney MRN: [id number] Date of Birth: [birth year] Referring Provider:  Patrici Ranks, MD  Encounter Date: 05/30/2014      PT End of Session - 05/30/14 0826    Visit Number 18   Number of Visits 24   Date for PT Re-Evaluation 06/13/14   Authorization Type medicare    Authorization Time Period Gcode complete 10th session   Authorization - Visit Number 18   Authorization - Number of Visits 20   PT Start Time 0800   PT Stop Time 0853   PT Time Calculation (min) 53 min   Activity Tolerance Patient tolerated treatment well   Behavior During Therapy [facility] for tasks assessed/performed      Past Medical History  Diagnosis Date  . High cholesterol   . Hypertension   . Anxiety   . Cancer     breast- right  . Arthritis   . Breast cancer   . Family history of ovarian cancer   . Family history of colon cancer   . Family history of pancreatic cancer     Past Surgical History  Procedure Laterality Date  . Abdominal hysterectomy    . Mastectomy Right   . Cataract extraction w/phaco Left 03/11/2013    Procedure: CATARACT EXTRACTION PHACO AND INTRAOCULAR LENS PLACEMENT (IOC);  Surgeon: Tonny Branch, MD;  Location: [facility];  Service: Ophthalmology;  Laterality: Left;  CDE:7.54  . Cataract extraction w/phaco Right 04/15/2013    Procedure: CATARACT EXTRACTION PHACO AND INTRAOCULAR LENS PLACEMENT (IOC);  Surgeon: Tonny Branch, MD;  Location: [facility];  Service: Ophthalmology;  Laterality: Right;  CDE 9.38  . Mastectomy modified radical Left 11/04/2013    Procedure: LEFT MASTECTOMY MODIFIED RADICAL;  Surgeon: Jamesetta So, MD;  Location: [facility];  Service: General;  Laterality: Left;  . Portacath placement Right 12/06/2013    Procedure: INSERTION PORT-A-CATH-Right subclavian;  Surgeon: Jamesetta So, MD;  Location: [facility];  Service: General;  Laterality: Right;    There were no vitals filed for this visit.  Visit Diagnosis:  Stiffness of hip joint, unspecified laterality  Midline low back pain without sciatica  Bilateral low back pain without sciatica  Midline low back pain with left-sided sciatica  Right-sided low back pain with right-sided sciatica  Fatigue due to treatment      Subjective Assessment - 05/30/14 0802    Subjective Pt stated she slept in daughter's bed last night, feels stiff this morning.  No reports of pain today.     Currently in Pain? No/denies   Pain Descriptors / Indicators Tightness              [facility] Adult PT Treatment/Exercise - 05/30/14 0001    Bed Mobility   Bed Mobility Rolling Right;Rolling Left;Left Sidelying to Sit   Rolling Right 5: Supervision   Rolling Right Details (indicate cue type and reason) 5reps   Rolling Left 5: Supervision   Rolling Left Details (indicate cue type and reason) 52   Left Sidelying to Sit 5: Supervision   Left Sidelying to Sit Details (indicate cue type and reason) 2x   Lumbar Exercises: Stretches   Active Hamstring Stretch 3 reps;30 seconds   Active Hamstring Stretch Limitations Manual stretches supine position   Lower Trunk Rotation --  10x 10"   Hip Flexor Stretch 3  reps;20 seconds   Hip Flexor Stretch Limitations groin stretch 8in step   Prone on Elbows Stretch Limitations 3'   Press Ups 5 reps;Limitations   Press Ups Limitations 1/4 up    Quad Stretch 3 reps;30 seconds   Quad Stretch Limitations prone with rope   Piriformis Stretch 3 reps;30 seconds   Piriformis Stretch Limitations seated with HHA and towel to increase ease with putting on socks/shoes   Lumbar Exercises: Aerobic   Stationary Bike nustep level 4 x10 minutes, seat 7 65-70SPM with MHP for LBP   Lumbar Exercises: Standing   Functional Squats 10 reps   Functional Squats Limitations 3 sets 1st set STS no HHA; 2nd and 3rd  set squats infront of chair   Forward Lunge 10 reps   Forward Lunge Limitations 4in step   Side Lunge 10 reps   Side Lunge Limitations 4in step   Other Standing Lumbar Exercises 3D hip excursion with therapist facilitation 10x   Lumbar Exercises: Seated   Other Seated Lumbar Exercises 3D thoracic excursion                  PT Short Term Goals - 05/30/14 0827    PT SHORT TERM GOAL #1   Title Pt to be I in HEP   Status Achieved   PT SHORT TERM GOAL #2   Title Pt to have stated that she has started a walking program at home   Status On-going   PT [place] #3   Title Patient will demonstrate the efficiency of her car transfers, will be able to perform with independence and no fatigue, no difficulty with transfer    Status On-going   PT SHORT TERM GOAL #4   Title Patient will demonstrate the ability to pick an object up from the floor with more ease secondary to have gained at least 15 degrees lumbar flexion    PT SHORT TERM GOAL #5   Title Patient will demonstrate the ability to ambulate at least 1434ft during 6 minute walk and will be able to tolerate at least 15 minutes of exericse on Nustep secondary to improved functional activity tolerance            PT Long Term Goals - 05/30/14 0828    PT LONG TERM GOAL #1   Title I in advance HEP   PT LONG TERM GOAL #2   Title Pt to be walking for at lease 15 minutes a day 3x a week   PT LONG TERM GOAL #3   Title Pt to be able to come sit to stand without use of UE assist   Status On-going   PT LONG TERM GOAL #4   Title Pt to be able to SLS for 10 seconds for reduce risk of falling   PT LONG TERM GOAL #5   Title Pt TUG to be 12 or less to demonstrate decreased risk of falling    Status Achieved   PT LONG TERM GOAL #6   Title FACIT-F to have improved by 25 points   Status On-going               Plan - 05/30/14 8850    Clinical Impression Statement Session focus on improving spinal mobility to reduce  stiffness and improve transfer and functional strengthening.  Pt able to demonstrate sit to stand without HHA with minimal difficulty this session.  Only compliant of pain through session was Rt groin pain during forward lunges onto step.  Added groin stetch with no compliants following.    Pt continues to require therapist facilitation for proper form and technique with all exercises  Very minimal amount of edema noted with LEs today.  Improved abilty to cross legs with piiriformis stretches today though continues to need HHA.  No pain at end of session.     PT Next Visit Plan Continue with current PT POC.  Continue mobility exercises and stretches for increased functional tasks like putting on socks and shoes and gluteal strenghtening.  Stair training, lunges, balance activities, activity tolerance for walking.  MHP PRN for LBP on Nustep.        Problem List Patient Active Problem List   Diagnosis Date Noted  . Genetic testing 04/29/2014  . Family history of ovarian cancer   . Family history of colon cancer   . Family history of pancreatic cancer   . Breast cancer 11/04/2013  . Low back pain 11/12/2012   Aldona Lento, PTA  Aldona Lento 05/30/2014, 9:17 AM  [place] [address] [place], [place], [address] Phone: [phone]   Fax:  [phone]

## 2014-06-04 ENCOUNTER — Ambulatory Visit (HOSPITAL_COMMUNITY): Payer: Medicare Other | Admitting: Physical Therapy

## 2014-06-06 ENCOUNTER — Ambulatory Visit (HOSPITAL_COMMUNITY): Payer: Medicare Other | Admitting: Physical Therapy

## 2014-06-09 ENCOUNTER — Ambulatory Visit (HOSPITAL_COMMUNITY): Payer: Medicare Other | Attending: Hematology & Oncology | Admitting: Physical Therapy

## 2014-06-09 DIAGNOSIS — M545 Low back pain, unspecified: Secondary | ICD-10-CM

## 2014-06-09 DIAGNOSIS — R262 Difficulty in walking, not elsewhere classified: Secondary | ICD-10-CM | POA: Insufficient documentation

## 2014-06-09 DIAGNOSIS — M5441 Lumbago with sciatica, right side: Secondary | ICD-10-CM

## 2014-06-09 DIAGNOSIS — M5442 Lumbago with sciatica, left side: Secondary | ICD-10-CM

## 2014-06-09 DIAGNOSIS — M25659 Stiffness of unspecified hip, not elsewhere classified: Secondary | ICD-10-CM | POA: Diagnosis not present

## 2014-06-09 DIAGNOSIS — C50911 Malignant neoplasm of unspecified site of right female breast: Secondary | ICD-10-CM | POA: Diagnosis not present

## 2014-06-09 DIAGNOSIS — Z9221 Personal history of antineoplastic chemotherapy: Secondary | ICD-10-CM | POA: Diagnosis not present

## 2014-06-09 DIAGNOSIS — R5383 Other fatigue: Secondary | ICD-10-CM | POA: Insufficient documentation

## 2014-06-09 NOTE — Therapy (Signed)
[place], [place], [address] Phone: [phone]   Fax:  [phone]  Physical Therapy Treatment  Patient Details  Name: Tiffany Sweeney MRN: [id number] Date of Birth: 06/14/1934 Referring Provider:  Patrici Ranks, MD  Encounter Date: 06/09/2014      PT End of Session - 06/09/14 0855    Visit Number 19   Number of Visits 24   Date for PT Re-Evaluation 06/13/14   Authorization Type medicare    Authorization Time Period Gcode complete 10th session   Authorization - Visit Number 19   Authorization - Number of Visits 20   PT Start Time 0804   PT Stop Time 3295   PT Time Calculation (min) 51 min   Activity Tolerance Patient tolerated treatment well   Behavior During Therapy [facility] for tasks assessed/performed      Past Medical History  Diagnosis Date  . High cholesterol   . Hypertension   . Anxiety   . Cancer     breast- right  . Arthritis   . Breast cancer   . Family history of ovarian cancer   . Family history of colon cancer   . Family history of pancreatic cancer     Past Surgical History  Procedure Laterality Date  . Abdominal hysterectomy    . Mastectomy Right   . Cataract extraction w/phaco Left 03/11/2013    Procedure: CATARACT EXTRACTION PHACO AND INTRAOCULAR LENS PLACEMENT (IOC);  Surgeon: Tonny Branch, MD;  Location: [facility];  Service: Ophthalmology;  Laterality: Left;  CDE:7.54  . Cataract extraction w/phaco Right 04/15/2013    Procedure: CATARACT EXTRACTION PHACO AND INTRAOCULAR LENS PLACEMENT (IOC);  Surgeon: Tonny Branch, MD;  Location: [facility];  Service: Ophthalmology;  Laterality: Right;  CDE 9.38  . Mastectomy modified radical Left 11/04/2013    Procedure: LEFT MASTECTOMY MODIFIED RADICAL;  Surgeon: Jamesetta So, MD;  Location: [facility];  Service: General;  Laterality: Left;  . Portacath placement Right 12/06/2013    Procedure: INSERTION PORT-A-CATH-Right subclavian;  Surgeon: Jamesetta So,  MD;  Location: [facility];  Service: General;  Laterality: Right;    There were no vitals filed for this visit.  Visit Diagnosis:  Stiffness of hip joint, unspecified laterality  Midline low back pain without sciatica  Bilateral low back pain without sciatica  Midline low back pain with left-sided sciatica  Right-sided low back pain with right-sided sciatica  Fatigue due to treatment      Subjective Assessment - 06/09/14 0813    Subjective Pt states daughter can see a big improvement.  Pt states she is getting in/out bed better and her balance comes and goes.  currently without pain.   Currently in Pain? No/denies                         [facility] Adult PT Treatment/Exercise - 06/09/14 0811    Lumbar Exercises: Stretches   Hip Flexor Stretch 3 reps;20 seconds   Hip Flexor Stretch Limitations groin stretch 8in step   Lumbar Exercises: Aerobic   Stationary Bike nustep level 4 x10 minutes, seat 7 65-70SPM with MHP for LBP   Lumbar Exercises: Standing   Functional Squats 10 reps   Functional Squats Limitations 3 sets 1st set STS no HHA; 2nd and 3rd set squats infront of chair   Forward Lunge 10 reps   Forward Lunge Limitations 4in step   Side Lunge 10 reps   Side  Lunge Limitations 4in step   Lumbar Exercises: Prone   Straight Leg Raise 10 reps;3 seconds   Other Prone Lumbar Exercises manual quad stretch by therapist 30" each                  PT Short Term Goals - 05/30/14 0827    PT SHORT TERM GOAL #1   Title Pt to be I in HEP   Status Achieved   PT SHORT TERM GOAL #2   Title Pt to have stated that she has started a walking program at home   Status On-going   PT Lib[place] Negron Torres #3   Title Patient will demonstrate the efficiency of her car transfers, will be able to perform with independence and no fatigue, no difficulty with transfer    Status On-going   PT SHORT TERM GOAL #4   Title Patient will demonstrate the ability to pick an object up from  the floor with more ease secondary to have gained at least 15 degrees lumbar flexion    PT SHORT TERM GOAL #5   Title Patient will demonstrate the ability to ambulate at least 1473ft during 6 minute walk and will be able to tolerate at least 15 minutes of exericse on Nustep secondary to improved functional activity tolerance            PT Long Term Goals - 05/30/14 0828    PT LONG TERM GOAL #1   Title I in advance HEP   PT LONG TERM GOAL #2   Title Pt to be walking for at lease 15 minutes a day 3x a week   PT LONG TERM GOAL #3   Title Pt to be able to come sit to stand without use of UE assist   Status On-going   PT LONG TERM GOAL #4   Title Pt to be able to SLS for 10 seconds for reduce risk of falling   PT LONG TERM GOAL #5   Title Pt TUG to be 12 or less to demonstrate decreased risk of falling    Status Achieved   PT LONG TERM GOAL #6   Title FACIT-F to have improved by 25 points   Status On-going               Plan - 06/09/14 0855    Clinical Impression Statement Pt continues to have considerable discomfort in Rt groin with movement.  Pt unble to lift foot higher than onto a 8" step due to discomfort and weakness.  Improved control with sit to stand today without using HHA.  Focused on hip strength and stability today.   PT Next Visit Plan Continue with current PT POC.  Continue mobility exercises and stretches for increased functional tasks like putting on socks and shoes and gluteal strenghtening.  Stair training, lunges, balance activities, activity tolerance for walking.  MHP PRN for LBP on Nustep.        Problem List Patient Active Problem List   Diagnosis Date Noted  . Genetic testing 04/29/2014  . Family history of ovarian cancer   . Family history of colon cancer   . Family history of pancreatic cancer   . Breast cancer 11/04/2013  . Low back pain 11/12/2012    Teena Irani, PTA/CLT [phone]  06/09/2014, 8:58 AM  [place] [address] [place], [place], [address] Phone: [phone]   Fax:  [phone]

## 2014-06-11 ENCOUNTER — Ambulatory Visit (HOSPITAL_COMMUNITY): Payer: Medicare Other | Admitting: Physical Therapy

## 2014-06-11 DIAGNOSIS — M545 Low back pain, unspecified: Secondary | ICD-10-CM

## 2014-06-11 DIAGNOSIS — R5383 Other fatigue: Secondary | ICD-10-CM

## 2014-06-11 DIAGNOSIS — M25659 Stiffness of unspecified hip, not elsewhere classified: Secondary | ICD-10-CM

## 2014-06-11 NOTE — Therapy (Signed)
[place], [place], [address] Phone: [phone]   Fax:  [phone]  Physical Therapy Treatment (Re-Assessment/Re-Cert)  Patient Details  Name: Tiffany Sweeney MRN: [id number] Date of Birth: 1934/02/05 Referring Provider:  Patrici Ranks, MD  Encounter Date: 06/11/2014      PT End of Session - 06/11/14 1155    Visit Number 20   Number of Visits 32   Date for PT Re-Evaluation 07/09/14   Authorization Type medicare    Authorization Time Period Gcode complete 20th session    Authorization - Visit Number 20   Authorization - Number of Visits 30   PT Start Time 0801   PT Stop Time 5093   PT Time Calculation (min) 43 min   Activity Tolerance Patient tolerated treatment well   Behavior During Therapy [facility] for tasks assessed/performed      Past Medical History  Diagnosis Date  . High cholesterol   . Hypertension   . Anxiety   . Cancer     breast- right  . Arthritis   . Breast cancer   . Family history of ovarian cancer   . Family history of colon cancer   . Family history of pancreatic cancer     Past Surgical History  Procedure Laterality Date  . Abdominal hysterectomy    . Mastectomy Right   . Cataract extraction w/phaco Left 03/11/2013    Procedure: CATARACT EXTRACTION PHACO AND INTRAOCULAR LENS PLACEMENT (IOC);  Surgeon: Tonny Branch, MD;  Location: [facility];  Service: Ophthalmology;  Laterality: Left;  CDE:7.54  . Cataract extraction w/phaco Right 04/15/2013    Procedure: CATARACT EXTRACTION PHACO AND INTRAOCULAR LENS PLACEMENT (IOC);  Surgeon: Tonny Branch, MD;  Location: [facility];  Service: Ophthalmology;  Laterality: Right;  CDE 9.38  . Mastectomy modified radical Left 11/04/2013    Procedure: LEFT MASTECTOMY MODIFIED RADICAL;  Surgeon: Jamesetta So, MD;  Location: [facility];  Service: General;  Laterality: Left;  . Portacath placement Right 12/06/2013    Procedure: INSERTION PORT-A-CATH-Right subclavian;   Surgeon: Jamesetta So, MD;  Location: [facility];  Service: General;  Laterality: Right;    There were no vitals filed for this visit.  Visit Diagnosis:  Stiffness of hip joint, unspecified laterality - Plan: PT plan of care cert/re-cert  Midline low back pain without sciatica - Plan: PT plan of care cert/re-cert  Bilateral low back pain without sciatica - Plan: PT plan of care cert/re-cert  Fatigue due to treatment - Plan: PT plan of care cert/re-cert      Subjective Assessment - 06/11/14 0803    Subjective Patient continues to state that her daughter does see a big improvement, moving better in general, has more energy but does still get fatigued. Pain free still.    Pertinent History Breast cancer; Low back pain   How long can you sit comfortably? 6/8- no limits    How long can you stand comfortably? 6/8- no limits    How long can you walk comfortably? 6/8- still limited by feet and back but is going further, has to take light days sometimes due to issues with feet and back   Patient Stated Goals increased energy    Currently in Pain? No/denies            [facility] PT Assessment - 06/11/14 0001    Assessment   Medical Diagnosis fatigue/difficulty walking   Onset Date/Surgical Date 12/03/13   Next MD Visit [place] 07/01/2014  Prior Therapy none   Home Environment   Living Environment Private residence   Type of [place] to enter   Entrance Stairs-Number of Steps 5   Home Layout Laundry or work area in basement   Prior Function   Vocation Retired   Leisure reading, cooking, gardening    Functional Tests   Functional tests Sit to [employer] to Stand   Comments 5x sit to stand 24 seconds   AROM   Right Hip External Rotation  40   Right Hip Internal Rotation  12   Left Hip External Rotation  34   Left Hip Internal Rotation  26   Lumbar Flexion 45   Lumbar Extension 10   Lumbar - Right Side Bend 23   Lumbar - Left Side Bend 22   Lumbar - Right  Rotation remains decreased    Lumbar - Left Rotation remains decreased    Strength   Right Hip Flexion 2/5  pain limited    Right Hip Extension 3/5   Right Hip ABduction 4-/5  more pain limited today    Left Hip Flexion 4+/5   Left Hip Extension 3/5   Left Hip ABduction 4-/5   Right Knee Flexion 5/5   Right Knee Extension 4-/5   Left Knee Flexion 4+/5   Left Knee Extension 4/5   Right Ankle Dorsiflexion 4/5   Left Ankle Dorsiflexion 4+/5   6 Minute Walk- Baseline   6 Minute Walk- Baseline --  1047ft in 6 minutes                      [facility] Adult PT Treatment/Exercise - 06/11/14 0001    Lumbar Exercises: Aerobic   Stationary Bike nustep level 4 x10 minutes, seat 7 65-70SPM with MHP for LBP                PT Education - 06/11/14 1154    Education provided Yes   Education Details progress with PT, plan of care moving forward    Person(s) Educated Patient   Methods Explanation   Comprehension Verbalized understanding          PT Short Term Goals - 06/11/14 0831    PT SHORT TERM GOAL #1   Title Pt to be I in HEP   Time 1   Status Achieved   PT SHORT TERM GOAL #2   Title Pt to have stated that she has started a walking program at home   Baseline 5/13- has good days and bad days, but is walking regularly; not doing long distances right now due to pain in hips and feet    Time 2   Period Weeks   Status Achieved   PT SHORT TERM GOAL #3   Title Patient will demonstrate the efficiency of her car transfers, will be able to perform with independence and no fatigue, no difficulty with transfer    Baseline 6/8- still needs a little bit of help with this    Time 4   Period Weeks   Status On-going   PT SHORT TERM GOAL #4   Title Patient will demonstrate the ability to pick an object up from the floor with more ease secondary to have gained at least 15 degrees lumbar flexion    Time 4   Period Weeks   Status On-going   PT SHORT TERM GOAL #5   Title  Patient will demonstrate the ability to ambulate  at least 1422ft during 6 minute walk and will be able to tolerate at least 15 minutes of exericse on Nustep secondary to improved functional activity tolerance    Baseline 2022/06/19- 1069 ft during 6MWT   Time 4   Period Weeks   Status On-going           PT Long Term Goals - 19-Jun-2014 0814    PT LONG TERM GOAL #1   Title I in advance HEP   Time 4   Period Weeks   Status On-going   PT LONG TERM GOAL #2   Title Pt to be walking for at lease 15 minutes a day 3x a week   Baseline 06/19/22- depends on how her day is going, good vs bad day, maybe averages 10 minutes    Time 4   Period Weeks   Status On-going   PT LONG TERM GOAL #3   Title Pt to be able to come sit to stand without use of UE assist, good eccentric contorl with descent    Time 4   Period Weeks   Status Revised   PT LONG TERM GOAL #4   Title Pt to be able to SLS for 10 seconds for reduce risk of falling   Time 4   Period Weeks   Status Achieved   PT LONG TERM GOAL #5   Title Pt TUG to be 12 or less to demonstrate decreased risk of falling    Time 4   Period Weeks   Status Achieved   PT LONG TERM GOAL #6   Title FACIT-F to have improved by 25 points   Time 4   Period Weeks   Status On-going               Plan - June 19, 2014 1155    Clinical Impression Statement Re-assessment performed today. Patient continues to demonstrate reduced balance skills, reduced lumbar and bilateral hip mobility, postural and gait impairments, difficulty with functional mobility such as car transfers, reduced functional endurance, reduced gait speed, and pain in her R hip and low back. The patient states that her daughter has stated she  has noticed a difference in how the patient is moving, however the patient continues to state she is having difficulty with reduced balance, fatigue, and functional tranfers at this time. Patient will beneftit from skilled PT services for 4 more weeks, at 3x/week,  in order to address these deficits and assist her in reaching an optimal level of function.    Pt will benefit from skilled therapeutic intervention in order to improve on the following deficits Decreased activity tolerance;Decreased balance;Decreased endurance;Decreased strength;Difficulty walking;Decreased range of motion   Rehab Potential Good   PT Frequency 3x / week   PT Duration 4 weeks   PT Treatment/Interventions Therapeutic activities;Therapeutic exercise;Balance training;Functional mobility training;Patient/family education;Manual techniques   PT Next Visit Plan Continue with current PT POC.  Continue mobility exercises and stretches for increased functional tasks like putting on socks and shoes and gluteal strenghtening.  Stair training, lunges, balance activities, activity tolerance for walking.  MHP PRN for LBP on Nustep.   PT Home Exercise Plan given   Consulted and Agree with Plan of Care Patient          G-Codes - 06-19-14 1159    Functional Assessment Tool Used skilled clinical assessment based on mobility, joint stiffness, balance, functional endurance, functional mobility    Functional Limitation Mobility: Walking and moving around   Mobility: Walking and Moving Around  Current Status [phone]) At least 40 percent but less than 60 percent impaired, limited or restricted   Mobility: Walking and Moving Around Goal Status [phone]) At least 20 percent but less than 40 percent impaired, limited or restricted      Problem List Patient Active Problem List   Diagnosis Date Noted  . Genetic testing 04/29/2014  . Family history of ovarian cancer   . Family history of colon cancer   . Family history of pancreatic cancer   . Breast cancer 11/04/2013  . Low back pain 11/12/2012    Deniece Ree PT, DPT [place] [address] [place], [place], [address] Phone: [phone]   Fax:  [phone]

## 2014-06-13 ENCOUNTER — Ambulatory Visit (HOSPITAL_COMMUNITY): Payer: Medicare Other | Admitting: Physical Therapy

## 2014-06-13 DIAGNOSIS — M25659 Stiffness of unspecified hip, not elsewhere classified: Secondary | ICD-10-CM

## 2014-06-13 DIAGNOSIS — M545 Low back pain, unspecified: Secondary | ICD-10-CM

## 2014-06-13 DIAGNOSIS — R5383 Other fatigue: Secondary | ICD-10-CM

## 2014-06-13 NOTE — Therapy (Signed)
[place], [place], [address] Phone: [phone]   Fax:  [phone]  Physical Therapy Treatment  Patient Details  Name: Tiffany Sweeney MRN: [id number] Date of Birth: 1934-02-21 Referring Provider:  Patrici Ranks, MD  Encounter Date: 06/13/2014      PT End of Session - 06/13/14 0843    Visit Number 21   Number of Visits 32   Date for PT Re-Evaluation 07/09/14   Authorization Type medicare    Authorization Time Period Gcode complete 20th session    Authorization - Visit Number 21   Authorization - Number of Visits 30   PT Start Time 0800   PT Stop Time 0844   PT Time Calculation (min) 44 min   Activity Tolerance Patient tolerated treatment well   Behavior During Therapy [facility] for tasks assessed/performed      Past Medical History  Diagnosis Date  . High cholesterol   . Hypertension   . Anxiety   . Cancer     breast- right  . Arthritis   . Breast cancer   . Family history of ovarian cancer   . Family history of colon cancer   . Family history of pancreatic cancer     Past Surgical History  Procedure Laterality Date  . Abdominal hysterectomy    . Mastectomy Right   . Cataract extraction w/phaco Left 03/11/2013    Procedure: CATARACT EXTRACTION PHACO AND INTRAOCULAR LENS PLACEMENT (IOC);  Surgeon: Tonny Branch, MD;  Location: [facility];  Service: Ophthalmology;  Laterality: Left;  CDE:7.54  . Cataract extraction w/phaco Right 04/15/2013    Procedure: CATARACT EXTRACTION PHACO AND INTRAOCULAR LENS PLACEMENT (IOC);  Surgeon: Tonny Branch, MD;  Location: [facility];  Service: Ophthalmology;  Laterality: Right;  CDE 9.38  . Mastectomy modified radical Left 11/04/2013    Procedure: LEFT MASTECTOMY MODIFIED RADICAL;  Surgeon: Jamesetta So, MD;  Location: [facility];  Service: General;  Laterality: Left;  . Portacath placement Right 12/06/2013    Procedure: INSERTION PORT-A-CATH-Right subclavian;  Surgeon: Jamesetta So, MD;  Location: [facility];  Service: General;  Laterality: Right;    There were no vitals filed for this visit.  Visit Diagnosis:  Stiffness of hip joint, unspecified laterality  Midline low back pain without sciatica  Bilateral low back pain without sciatica  Fatigue due to treatment      Subjective Assessment - 06/13/14 0806    Subjective Right hip has been really sore, but it has been going on for years. No pain when just standing.    Pain Score 0-No pain                         [facility] Adult PT Treatment/Exercise - 06/13/14 0001    Ambulation/Gait   Ambulation/Gait Yes   Ambulation/Gait Assistance 7: Independent   Ambulation Distance (Feet) 1000 Feet   Ambulation Surface Level   Gait Comments distance total for session, ambulaton between stretches.    Lumbar Exercises: Stretches   Active Hamstring Stretch 3 reps;30 seconds   Active Hamstring Stretch Limitations Manual stretches supine position   Hip Flexor Stretch 3 reps;20 seconds   Hip Flexor Stretch Limitations groin stretch 8in step   Lumbar Exercises: Aerobic   Stationary Bike nustep level 4 x10 minutes total 5 beginning/5 end.    Lumbar Exercises: Standing   Functional Squats 10 reps   Functional Squats Limitations 2 sets   Side Lunge  10 reps   Side Lunge Limitations 4in step                PT Education - 06/13/14 0841    Education provided Yes   Education Details encouraged continuing walking with gradual increases   Person(s) Educated Patient   Comprehension Verbalized understanding          PT Short Term Goals - 06/11/14 0831    PT SHORT TERM GOAL #1   Title Pt to be I in HEP   Time 1   Status Achieved   PT SHORT TERM GOAL #2   Title Pt to have stated that she has started a walking program at home   Baseline 5/13- has good days and bad days, but is walking regularly; not doing long distances right now due to pain in hips and feet    Time 2   Period Weeks   Status  Achieved   PT SHORT TERM GOAL #3   Title Patient will demonstrate the efficiency of her car transfers, will be able to perform with independence and no fatigue, no difficulty with transfer    Baseline 6/8- still needs a little bit of help with this    Time 4   Period Weeks   Status On-going   PT SHORT TERM GOAL #4   Title Patient will demonstrate the ability to pick an object up from the floor with more ease secondary to have gained at least 15 degrees lumbar flexion    Time 4   Period Weeks   Status On-going   PT SHORT TERM GOAL #5   Title Patient will demonstrate the ability to ambulate at least 1483ft during 6 minute walk and will be able to tolerate at least 15 minutes of exericse on Nustep secondary to improved functional activity tolerance    Baseline 6/8- 1069 ft during 6MWT   Time 4   Period Weeks   Status On-going           PT Long Term Goals - 06/11/14 1829    PT LONG TERM GOAL #1   Title I in advance HEP   Time 4   Period Weeks   Status On-going   PT LONG TERM GOAL #2   Title Pt to be walking for at lease 15 minutes a day 3x a week   Baseline 6/8- depends on how her day is going, good vs bad day, maybe averages 10 minutes    Time 4   Period Weeks   Status On-going   PT LONG TERM GOAL #3   Title Pt to be able to come sit to stand without use of UE assist, good eccentric contorl with descent    Time 4   Period Weeks   Status Revised   PT LONG TERM GOAL #4   Title Pt to be able to SLS for 10 seconds for reduce risk of falling   Time 4   Period Weeks   Status Achieved   PT LONG TERM GOAL #5   Title Pt TUG to be 12 or less to demonstrate decreased risk of falling    Time 4   Period Weeks   Status Achieved   PT LONG TERM GOAL #6   Title FACIT-F to have improved by 25 points   Time 4   Period Weeks   Status On-going               Plan - 06/13/14 0844    Clinical Impression  Statement Continue with activity progression with encouragement to  gradually increse walking time and distance at home. Patient expressing that she wants to get stronger too so she can stand up from her chair easier.    Rehab Potential Good   PT Frequency 3x / week   PT Next Visit Plan Continue with strengthening and flexibility activities as able.    PT Home Exercise Plan no change today   Consulted and Agree with Plan of Care Patient        Problem List Patient Active Problem List   Diagnosis Date Noted  . Genetic testing 04/29/2014  . Family history of ovarian cancer   . Family history of colon cancer   . Family history of pancreatic cancer   . Breast cancer 11/04/2013  . Low back pain 11/12/2012    Cassell Clement, PT, CSCS  06/13/2014, 8:47 AM  [place] [address] [place], [place], [address] Phone: [phone]   Fax:  [phone]

## 2014-06-16 ENCOUNTER — Ambulatory Visit (HOSPITAL_COMMUNITY): Payer: Medicare Other | Admitting: Physical Therapy

## 2014-06-16 DIAGNOSIS — M545 Low back pain, unspecified: Secondary | ICD-10-CM

## 2014-06-16 DIAGNOSIS — M5442 Lumbago with sciatica, left side: Secondary | ICD-10-CM

## 2014-06-16 DIAGNOSIS — M5441 Lumbago with sciatica, right side: Secondary | ICD-10-CM

## 2014-06-16 DIAGNOSIS — M25659 Stiffness of unspecified hip, not elsewhere classified: Secondary | ICD-10-CM

## 2014-06-16 DIAGNOSIS — R5383 Other fatigue: Secondary | ICD-10-CM

## 2014-06-16 NOTE — Therapy (Signed)
[facility] [place], [place], [address] Phone: [phone]   Fax:  [phone]  Physical Therapy Treatment  Patient Details  Name: Tiffany Sweeney MRN: [id number] Date of Birth: 1934/05/05 Referring Provider:  Patrici Ranks, MD  Encounter Date: 06/16/2014      PT End of Session - 06/16/14 0843    Visit Number 22   Number of Visits 32   Date for PT Re-Evaluation 07/09/14   Authorization Type medicare    Authorization Time Period Gcode complete 20th session    Authorization - Visit Number 21   Authorization - Number of Visits 30   PT Start Time 0804   PT Stop Time 1448   PT Time Calculation (min) 53 min   Activity Tolerance Patient tolerated treatment well      Past Medical History  Diagnosis Date  . High cholesterol   . Hypertension   . Anxiety   . Cancer     breast- right  . Arthritis   . Breast cancer   . Family history of ovarian cancer   . Family history of colon cancer   . Family history of pancreatic cancer     Past Surgical History  Procedure Laterality Date  . Abdominal hysterectomy    . Mastectomy Right   . Cataract extraction w/phaco Left 03/11/2013    Procedure: CATARACT EXTRACTION PHACO AND INTRAOCULAR LENS PLACEMENT (IOC);  Surgeon: Tonny Branch, MD;  Location: [facility];  Service: Ophthalmology;  Laterality: Left;  CDE:7.54  . Cataract extraction w/phaco Right 04/15/2013    Procedure: CATARACT EXTRACTION PHACO AND INTRAOCULAR LENS PLACEMENT (IOC);  Surgeon: Tonny Branch, MD;  Location: [facility];  Service: Ophthalmology;  Laterality: Right;  CDE 9.38  . Mastectomy modified radical Left 11/04/2013    Procedure: LEFT MASTECTOMY MODIFIED RADICAL;  Surgeon: Jamesetta So, MD;  Location: [facility];  Service: General;  Laterality: Left;  . Portacath placement Right 12/06/2013    Procedure: INSERTION PORT-A-CATH-Right subclavian;  Surgeon: Jamesetta So, MD;  Location: [facility];  Service: General;  Laterality:  Right;    There were no vitals filed for this visit.  Visit Diagnosis:  Stiffness of hip joint, unspecified laterality  Midline low back pain without sciatica  Bilateral low back pain without sciatica  Fatigue due to treatment  Midline low back pain with left-sided sciatica  Right-sided low back pain with right-sided sciatica      Subjective Assessment - 06/16/14 0835    Subjective Pt states that she stretches everyday    Currently in Pain? No/denies               [facility] Adult PT Treatment/Exercise - 06/16/14 0001    Lumbar Exercises: Stretches   Active Hamstring Stretch 3 reps;30 seconds   Single Knee to Chest Stretch 3 reps;30 seconds   Piriformis Stretch 3 reps;30 seconds   Piriformis Stretch Limitations passive    Lumbar Exercises: Aerobic   Stationary Bike nustep level 4 x10 minutes total 5 beginning/5 end.    Lumbar Exercises: Standing   Heel Raises 10 reps   Heel Raises Limitations combined with functinal squat raising and lowering yellow ball   Functional Squats 10 reps   Forward Lunge 10 reps   Side Lunge 10 reps   Other Standing Lumbar Exercises marching x 10; tandem and retro gt on balance beamx 3 RT    Other Standing Lumbar Exercises 3D hip excursion with therapist facilitation 10x   Lumbar Exercises:  Seated   Sit to Stand 10 reps   Sit to Stand Limitations Rt in back then LT                 PT Education - 06/16/14 0843    Education provided Yes   Education Details encouraged to stretch every day    Person(s) Educated Patient   Methods Explanation   Comprehension Verbalized understanding          PT Short Term Goals - 06/11/14 0831    PT SHORT TERM GOAL #1   Title Pt to be I in HEP   Time 1   Status Achieved   PT SHORT TERM GOAL #2   Title Pt to have stated that she has started a walking program at home   Baseline 5/13- has good days and bad days, but is walking regularly; not doing long distances right now due to pain in hips  and feet    Time 2   Period Weeks   Status Achieved   PT SHORT TERM GOAL #3   Title Patient will demonstrate the efficiency of her car transfers, will be able to perform with independence and no fatigue, no difficulty with transfer    Baseline 6/8- still needs a little bit of help with this    Time 4   Period Weeks   Status On-going   PT SHORT TERM GOAL #4   Title Patient will demonstrate the ability to pick an object up from the floor with more ease secondary to have gained at least 15 degrees lumbar flexion    Time 4   Period Weeks   Status On-going   PT SHORT TERM GOAL #5   Title Patient will demonstrate the ability to ambulate at least 1428ft during 6 minute walk and will be able to tolerate at least 15 minutes of exericse on Nustep secondary to improved functional activity tolerance    Baseline 6/8- 1069 ft during 6MWT   Time 4   Period Weeks   Status On-going           PT Long Term Goals - 06/11/14 3300    PT LONG TERM GOAL #1   Title I in advance HEP   Time 4   Period Weeks   Status On-going   PT LONG TERM GOAL #2   Title Pt to be walking for at lease 15 minutes a day 3x a week   Baseline 6/8- depends on how her day is going, good vs bad day, maybe averages 10 minutes    Time 4   Period Weeks   Status On-going   PT LONG TERM GOAL #3   Title Pt to be able to come sit to stand without use of UE assist, good eccentric contorl with descent    Time 4   Period Weeks   Status Revised   PT LONG TERM GOAL #4   Title Pt to be able to SLS for 10 seconds for reduce risk of falling   Time 4   Period Weeks   Status Achieved   PT LONG TERM GOAL #5   Title Pt TUG to be 12 or less to demonstrate decreased risk of falling    Time 4   Period Weeks   Status Achieved   PT LONG TERM GOAL #6   Title FACIT-F to have improved by 25 points   Time 4   Period Weeks   Status On-going  Plan - 06/16/14 0843    Clinical Impression Statement Pt demonstrates  improved balance and core strength although there is still signifcant weakness.  Pt balance improving as well    PT Next Visit Plan begin vector stance and steps        Problem List Patient Active Problem List   Diagnosis Date Noted  . Genetic testing 04/29/2014  . Family history of ovarian cancer   . Family history of colon cancer   . Family history of pancreatic cancer   . Breast cancer 11/04/2013  . Low back pain 11/12/2012  Rayetta Humphrey, PT CLT [phone] 06/16/2014, 9:38 AM  [facility] [address] [place], [place], [address] Phone: [phone]   Fax:  [phone]

## 2014-06-17 ENCOUNTER — Encounter (HOSPITAL_COMMUNITY): Payer: Medicare Other

## 2014-06-18 ENCOUNTER — Ambulatory Visit (HOSPITAL_COMMUNITY): Payer: Medicare Other | Admitting: Physical Therapy

## 2014-06-18 DIAGNOSIS — M5442 Lumbago with sciatica, left side: Secondary | ICD-10-CM

## 2014-06-18 DIAGNOSIS — M545 Low back pain, unspecified: Secondary | ICD-10-CM

## 2014-06-18 DIAGNOSIS — M5441 Lumbago with sciatica, right side: Secondary | ICD-10-CM

## 2014-06-18 DIAGNOSIS — M25659 Stiffness of unspecified hip, not elsewhere classified: Secondary | ICD-10-CM

## 2014-06-18 DIAGNOSIS — R5383 Other fatigue: Secondary | ICD-10-CM

## 2014-06-18 NOTE — Therapy (Signed)
[facility] [place], [place], [address] Phone: [phone]   Fax:  [phone]  Physical Therapy Treatment  Patient Details  Name: Tiffany Sweeney MRN: [id number] Date of Birth: [birth year] Referring Provider:  Patrici Ranks, MD  Encounter Date: 06/18/2014      PT End of Session - 06/18/14 0947    Visit Number 23   Number of Visits 32   Date for PT Re-Evaluation 07/09/14   Authorization Type medicare    Authorization Time Period Gcode complete 20th session    Authorization - Visit Number 23   Authorization - Number of Visits 30   PT Start Time 0804   PT Stop Time 0845   PT Time Calculation (min) 41 min   Activity Tolerance Patient tolerated treatment well   Behavior During Therapy [facility] for tasks assessed/performed      Past Medical History  Diagnosis Date  . High cholesterol   . Hypertension   . Anxiety   . Cancer     breast- right  . Arthritis   . Breast cancer   . Family history of ovarian cancer   . Family history of colon cancer   . Family history of pancreatic cancer     Past Surgical History  Procedure Laterality Date  . Abdominal hysterectomy    . Mastectomy Right   . Cataract extraction w/phaco Left 03/11/2013    Procedure: CATARACT EXTRACTION PHACO AND INTRAOCULAR LENS PLACEMENT (IOC);  Surgeon: Tonny Branch, MD;  Location: [facility];  Service: Ophthalmology;  Laterality: Left;  CDE:7.54  . Cataract extraction w/phaco Right 04/15/2013    Procedure: CATARACT EXTRACTION PHACO AND INTRAOCULAR LENS PLACEMENT (IOC);  Surgeon: Tonny Branch, MD;  Location: [facility];  Service: Ophthalmology;  Laterality: Right;  CDE 9.38  . Mastectomy modified radical Left 11/04/2013    Procedure: LEFT MASTECTOMY MODIFIED RADICAL;  Surgeon: Jamesetta So, MD;  Location: [facility];  Service: General;  Laterality: Left;  . Portacath placement Right 12/06/2013    Procedure: INSERTION PORT-A-CATH-Right subclavian;  Surgeon: Jamesetta So, MD;  Location: [facility];  Service: General;  Laterality: Right;    There were no vitals filed for this visit.  Visit Diagnosis:  Stiffness of hip joint, unspecified laterality  Midline low back pain without sciatica  Bilateral low back pain without sciatica  Fatigue due to treatment  Midline low back pain with left-sided sciatica  Right-sided low back pain with right-sided sciatica      Subjective Assessment - 06/18/14 0941    Subjective Pt states she is doing well and doing her exercises.  States she needs to leave at 8:45 for another appointment.    Currently in Pain? No/denies                         [facility] Adult PT Treatment/Exercise - 06/18/14 0943    Lumbar Exercises: Stretches   Hip Flexor Stretch 3 reps;20 seconds   Hip Flexor Stretch Limitations 12" step   Lumbar Exercises: Aerobic   Stationary Bike nustep level 4 x10 minutes total 5 beginning/5 end.    Lumbar Exercises: Standing   Heel Raises 10 reps   Heel Raises Limitations to 14" step; combined with functinal squat raising and lowering yellow ball   Forward Lunge 15 reps   Forward Lunge Limitations 4"   Side Lunge 15 reps   Side Lunge Limitations 4"   Other Standing Lumbar Exercises marching x 15;  tandem and retro gt on balance beamx 3 RT    Other Standing Lumbar Exercises 3D hip excursion with therapist facilitation 15x   Lumbar Exercises: Seated   Sit to Stand 10 reps   Sit to Stand Limitations Rt in back then LT                   PT Short Term Goals - 06/11/14 0831    PT SHORT TERM GOAL #1   Title Pt to be I in HEP   Time 1   Status Achieved   PT SHORT TERM GOAL #2   Title Pt to have stated that she has started a walking program at home   Baseline 5/13- has good days and bad days, but is walking regularly; not doing long distances right now due to pain in hips and feet    Time 2   Period Weeks   Status Achieved   PT SHORT TERM GOAL #3   Title Patient will  demonstrate the efficiency of her car transfers, will be able to perform with independence and no fatigue, no difficulty with transfer    Baseline 6/8- still needs a little bit of help with this    Time 4   Period Weeks   Status On-going   PT SHORT TERM GOAL #4   Title Patient will demonstrate the ability to pick an object up from the floor with more ease secondary to have gained at least 15 degrees lumbar flexion    Time 4   Period Weeks   Status On-going   PT SHORT TERM GOAL #5   Title Patient will demonstrate the ability to ambulate at least 1464ft during 6 minute walk and will be able to tolerate at least 15 minutes of exericse on Nustep secondary to improved functional activity tolerance    Baseline 6/8- 1069 ft during 6MWT   Time 4   Period Weeks   Status On-going           PT Long Term Goals - 06/11/14 8295    PT LONG TERM GOAL #1   Title I in advance HEP   Time 4   Period Weeks   Status On-going   PT LONG TERM GOAL #2   Title Pt to be walking for at lease 15 minutes a day 3x a week   Baseline 6/8- depends on how her day is going, good vs bad day, maybe averages 10 minutes    Time 4   Period Weeks   Status On-going   PT LONG TERM GOAL #3   Title Pt to be able to come sit to stand without use of UE assist, good eccentric contorl with descent    Time 4   Period Weeks   Status Revised   PT LONG TERM GOAL #4   Title Pt to be able to SLS for 10 seconds for reduce risk of falling   Time 4   Period Weeks   Status Achieved   PT LONG TERM GOAL #5   Title Pt TUG to be 12 or less to demonstrate decreased risk of falling    Time 4   Period Weeks   Status Achieved   PT LONG TERM GOAL #6   Title FACIT-F to have improved by 25 points   Time 4   Period Weeks   Status On-going               Plan - 06/18/14 0949    Clinical Impression  Statement Pt continues to have most difficulty wtih lifting and completing functional tasks with Rt LE.  Was able to lift Rt LE up  onto 12" step today after 2 attempts.  PT is progressing with balance, only with 1 uncorrected LOB while on balance beam.  Improved STS ability and control.    PT Next Visit Plan begin vector stance, hurdles and stairs next visit.  Continue to increase LE and core strength as well as balance.         Problem List Patient Active Problem List   Diagnosis Date Noted  . Genetic testing 04/29/2014  . Family history of ovarian cancer   . Family history of colon cancer   . Family history of pancreatic cancer   . Breast cancer 11/04/2013  . Low back pain 11/12/2012    Teena Irani, PTA/CLT [phone]  06/18/2014, 9:56 AM  [facility] [address] [place], [place], [address] Phone: [phone]   Fax:  [phone]

## 2014-06-20 ENCOUNTER — Ambulatory Visit (HOSPITAL_COMMUNITY): Payer: Medicare Other | Admitting: Physical Therapy

## 2014-06-20 DIAGNOSIS — M545 Low back pain, unspecified: Secondary | ICD-10-CM

## 2014-06-20 DIAGNOSIS — M25659 Stiffness of unspecified hip, not elsewhere classified: Secondary | ICD-10-CM

## 2014-06-20 DIAGNOSIS — R5383 Other fatigue: Secondary | ICD-10-CM

## 2014-06-20 NOTE — Therapy (Signed)
[place], [place], [address] Phone: [phone]   Fax:  [phone]  Physical Therapy Treatment  Patient Details  Name: Tiffany Sweeney MRN: [id number] Date of Birth: 07-Sep-1934 Referring Provider:  Patrici Ranks, MD  Encounter Date: 06/20/2014      PT End of Session - 06/20/14 0848    Visit Number 24   Number of Visits 32   Date for PT Re-Evaluation 07/09/14   Authorization Type medicare    Authorization - Visit Number 24   Authorization - Number of Visits 30   PT Start Time 0803   PT Stop Time 0859   PT Time Calculation (min) 56 min   Activity Tolerance Other (comment)  Pt demonstrated anxiety and weepiness in today's tx as a result of annivesary of daughter's death      Past Medical History  Diagnosis Date  . High cholesterol   . Hypertension   . Anxiety   . Cancer     breast- right  . Arthritis   . Breast cancer   . Family history of ovarian cancer   . Family history of colon cancer   . Family history of pancreatic cancer     Past Surgical History  Procedure Laterality Date  . Abdominal hysterectomy    . Mastectomy Right   . Cataract extraction w/phaco Left 03/11/2013    Procedure: CATARACT EXTRACTION PHACO AND INTRAOCULAR LENS PLACEMENT (IOC);  Surgeon: Tonny Branch, MD;  Location: [facility];  Service: Ophthalmology;  Laterality: Left;  CDE:7.54  . Cataract extraction w/phaco Right 04/15/2013    Procedure: CATARACT EXTRACTION PHACO AND INTRAOCULAR LENS PLACEMENT (IOC);  Surgeon: Tonny Branch, MD;  Location: [facility];  Service: Ophthalmology;  Laterality: Right;  CDE 9.38  . Mastectomy modified radical Left 11/04/2013    Procedure: LEFT MASTECTOMY MODIFIED RADICAL;  Surgeon: Jamesetta So, MD;  Location: [facility];  Service: General;  Laterality: Left;  . Portacath placement Right 12/06/2013    Procedure: INSERTION PORT-A-CATH-Right subclavian;  Surgeon: Jamesetta So, MD;  Location: [facility];  Service:  General;  Laterality: Right;    There were no vitals filed for this visit.  Visit Diagnosis:  Stiffness of hip joint, unspecified laterality  Midline low back pain without sciatica  Bilateral low back pain without sciatica  Fatigue due to treatment      Subjective Assessment - 06/20/14 0804    Subjective Pt reports that she is having a little bit of pain in her groin today. She is still having difficulty picking things up off of the floor.    Currently in Pain? Yes   Pain Score 1    Pain Location Groin   Pain Orientation Right   Pain Type Chronic pain             [facility] Adult PT Treatment/Exercise - 06/20/14 0001    Ambulation/Gait   Stairs Yes   Stairs Assistance 4: Min guard   Stair Management Technique No rails;One rail Left;Alternating pattern   Number of Stairs 20  12 4  inch steps, 8 7 inch steps   Lumbar Exercises: Stretches   Single Knee to Chest Stretch 1 rep;60 seconds   Prone on Elbows Stretch 30 seconds   Press Ups 5 reps;Limitations   Press Ups Limitations 1/4 up, 5 reps x 2    Lumbar Exercises: Aerobic   Stationary Bike nustep level 4 x10 minutes total 5 beginning/5 end.    Lumbar Exercises: Standing  Functional Squats 10 reps  attempting to touch 12 inch step   Knee/Hip Exercises: Stretches   Quad Stretch 3 reps;30 seconds   Knee/Hip Exercises: Standing   SLS with Vectors 3 reps per leg   Knee/Hip Exercises: Prone   Other Prone Exercises PROM for IR of hips           PT Education - 06/20/14 0846    Education provided Yes   Education Details Educated on incorporating prone on elbows stretch into HEP   Person(s) Educated Patient   Methods Explanation   Comprehension Verbalized understanding;Returned demonstration          PT Short Term Goals - 06/11/14 0831    PT SHORT TERM GOAL #1   Title Pt to be I in HEP   Time 1   Status Achieved   PT SHORT TERM GOAL #2   Title Pt to have stated that she has started a walking program at home    Baseline 5/13- has good days and bad days, but is walking regularly; not doing long distances right now due to pain in hips and feet    Time 2   Period Weeks   Status Achieved   PT SHORT TERM GOAL #3   Title Patient will demonstrate the efficiency of her car transfers, will be able to perform with independence and no fatigue, no difficulty with transfer    Baseline 6/8- still needs a little bit of help with this    Time 4   Period Weeks   Status On-going   PT SHORT TERM GOAL #4   Title Patient will demonstrate the ability to pick an object up from the floor with more ease secondary to have gained at least 15 degrees lumbar flexion    Time 4   Period Weeks   Status On-going   PT SHORT TERM GOAL #5   Title Patient will demonstrate the ability to ambulate at least 1478ft during 6 minute walk and will be able to tolerate at least 15 minutes of exericse on Nustep secondary to improved functional activity tolerance    Baseline 6/8- 1069 ft during 6MWT   Time 4   Period Weeks   Status On-going           PT Long Term Goals - 06/11/14 2952    PT LONG TERM GOAL #1   Title I in advance HEP   Time 4   Period Weeks   Status On-going   PT LONG TERM GOAL #2   Title Pt to be walking for at lease 15 minutes a day 3x a week   Baseline 6/8- depends on how her day is going, good vs bad day, maybe averages 10 minutes    Time 4   Period Weeks   Status On-going   PT LONG TERM GOAL #3   Title Pt to be able to come sit to stand without use of UE assist, good eccentric contorl with descent    Time 4   Period Weeks   Status Revised   PT LONG TERM GOAL #4   Title Pt to be able to SLS for 10 seconds for reduce risk of falling   Time 4   Period Weeks   Status Achieved   PT LONG TERM GOAL #5   Title Pt TUG to be 12 or less to demonstrate decreased risk of falling    Time 4   Period Weeks   Status Achieved   PT LONG TERM GOAL #  6   Title FACIT-F to have improved by 25 points   Time 4    Period Weeks   Status On-going            Plan - 06/20/14 0850    Clinical Impression Statement Pt continues to have difficulty with functional squatting, requiring verbal cueing to maintain proper alignment of hips and for confidence during functional squats. Pt demonstrated improvements during SLS with vectors for first repetition, but still struggled with second and third repetition due to fatigue.    PT Next Visit Plan Continue with functional squatting with lower surfaces to enable pt to retrieve items from floor. Begin hurdles, as they were not addressed this treatment.         Problem List Patient Active Problem List   Diagnosis Date Noted  . Genetic testing 04/29/2014  . Family history of ovarian cancer   . Family history of colon cancer   . Family history of pancreatic cancer   . Breast cancer 11/04/2013  . Low back pain 11/12/2012    Rayetta Humphrey, PT CLT [phone]  06/20/2014, 9:39 AM  [facility] [address] [place], [place], [address] Phone: [phone]   Fax:  [phone]

## 2014-06-23 ENCOUNTER — Ambulatory Visit (HOSPITAL_COMMUNITY): Payer: Medicare Other | Admitting: Physical Therapy

## 2014-06-25 ENCOUNTER — Encounter (HOSPITAL_COMMUNITY): Payer: Medicare Other

## 2014-06-25 ENCOUNTER — Ambulatory Visit (HOSPITAL_COMMUNITY): Payer: Medicare Other | Admitting: Hematology & Oncology

## 2014-06-25 ENCOUNTER — Ambulatory Visit (HOSPITAL_COMMUNITY): Payer: Medicare Other | Admitting: Physical Therapy

## 2014-06-27 ENCOUNTER — Ambulatory Visit (HOSPITAL_COMMUNITY): Payer: Medicare Other | Admitting: Physical Therapy

## 2014-06-30 ENCOUNTER — Ambulatory Visit (HOSPITAL_COMMUNITY): Payer: Medicare Other | Admitting: Physical Therapy

## 2014-06-30 DIAGNOSIS — M545 Low back pain, unspecified: Secondary | ICD-10-CM

## 2014-06-30 DIAGNOSIS — M25659 Stiffness of unspecified hip, not elsewhere classified: Secondary | ICD-10-CM

## 2014-06-30 DIAGNOSIS — R5383 Other fatigue: Secondary | ICD-10-CM

## 2014-06-30 NOTE — Therapy (Signed)
[place], [place], [address] Phone: [phone]   Fax:  [phone]  Physical Therapy Treatment  Patient Details  Name: Tiffany Sweeney MRN: [id number] Date of Birth: [birth year] Referring Provider:  Patrici Ranks, MD  Encounter Date: 06/30/2014      PT End of Session - 06/30/14 0846    Visit Number 25   Number of Visits 32   Date for PT Re-Evaluation 07/09/14   Authorization Time Period Gcode complete 20th session    Authorization - Visit Number 24   Authorization - Number of Visits 30   PT Start Time 0803   PT Stop Time 0845   PT Time Calculation (min) 42 min   Activity Tolerance Patient tolerated treatment well      Past Medical History  Diagnosis Date  . High cholesterol   . Hypertension   . Anxiety   . Cancer     breast- right  . Arthritis   . Breast cancer   . Family history of ovarian cancer   . Family history of colon cancer   . Family history of pancreatic cancer     Past Surgical History  Procedure Laterality Date  . Abdominal hysterectomy    . Mastectomy Right   . Cataract extraction w/phaco Left 03/11/2013    Procedure: CATARACT EXTRACTION PHACO AND INTRAOCULAR LENS PLACEMENT (IOC);  Surgeon: Tonny Branch, MD;  Location: [facility];  Service: Ophthalmology;  Laterality: Left;  CDE:7.54  . Cataract extraction w/phaco Right 04/15/2013    Procedure: CATARACT EXTRACTION PHACO AND INTRAOCULAR LENS PLACEMENT (IOC);  Surgeon: Tonny Branch, MD;  Location: [facility];  Service: Ophthalmology;  Laterality: Right;  CDE 9.38  . Mastectomy modified radical Left 11/04/2013    Procedure: LEFT MASTECTOMY MODIFIED RADICAL;  Surgeon: Jamesetta So, MD;  Location: [facility];  Service: General;  Laterality: Left;  . Portacath placement Right 12/06/2013    Procedure: INSERTION PORT-A-CATH-Right subclavian;  Surgeon: Jamesetta So, MD;  Location: [facility];  Service: General;  Laterality: Right;    There were no vitals  filed for this visit.  Visit Diagnosis:  Stiffness of hip joint, unspecified laterality  Midline low back pain without sciatica  Fatigue due to treatment      Subjective Assessment - 06/30/14 0803    Subjective Pt states she fell last week while on vacation.  Her son-in-law had placed an item behind her and she stepped back and tripped.  At this time her knees are black and blue and hurt ot bend and she is having discomfort in her neck and shoulders.     Currently in Pain? Yes   Pain Score 5    Pain Location Neck   Pain Orientation Right;Left   Pain Type Chronic pain   Multiple Pain Sites Yes   Pain Score 7   Pain Location Knee   Pain Orientation Right;Left              [facility] Adult PT Treatment/Exercise - 06/30/14 0001    Lumbar Exercises: Stretches   Active Hamstring Stretch --   Passive Hamstring Stretch 3 reps;30 seconds   Passive Hamstring Stretch Limitations B   Single Knee to Chest Stretch 2 reps;30 seconds   Single Knee to Chest Stretch Limitations B PROM   Quad Stretch 1 rep;60 seconds   Quad Stretch Limitations hip adductor stretch B x 1' x 2    Piriformis Stretch 3 reps;30 seconds  IR as well as  ER B    Piriformis Stretch Limitations passive    Lumbar Exercises: Aerobic   Stationary Bike nustep level 4 x10 minutes total 5 beginning/5 end.    Lumbar Exercises: Seated   Other Seated Lumbar Exercises 3D neck excursions x5; shoulder shrugs and rolls x 5 @    Lumbar Exercises: Supine   Heel Slides 10 reps   Knee/Hip Exercises: Standing   Other Standing Knee Exercises Adductor stretch x   Knee/Hip Exercises: Supine   Quad Sets Right;Left;10 reps   Knee/Hip Exercises: Prone   Other Prone Exercises PROM for IR of hips   Modalities   Modalities Moist Heat   Moist Heat Therapy   Number Minutes Moist Heat 20 Minutes   Moist Heat Location Cervical                  PT Short Term Goals - 06/11/14 0831    PT SHORT TERM GOAL #1   Title Pt to be I in  HEP   Time 1   Status Achieved   PT SHORT TERM GOAL #2   Title Pt to have stated that she has started a walking program at home   Baseline 5/13- has good days and bad days, but is walking regularly; not doing long distances right now due to pain in hips and feet    Time 2   Period Weeks   Status Achieved   PT SHORT TERM GOAL #3   Title Patient will demonstrate the efficiency of her car transfers, will be able to perform with independence and no fatigue, no difficulty with transfer    Baseline 6/8- still needs a little bit of help with this    Time 4   Period Weeks   Status On-going   PT SHORT TERM GOAL #4   Title Patient will demonstrate the ability to pick an object up from the floor with more ease secondary to have gained at least 15 degrees lumbar flexion    Time 4   Period Weeks   Status On-going   PT SHORT TERM GOAL #5   Title Patient will demonstrate the ability to ambulate at least 1459ft during 6 minute walk and will be able to tolerate at least 15 minutes of exericse on Nustep secondary to improved functional activity tolerance    Baseline 6/8- 1069 ft during 6MWT   Time 4   Period Weeks   Status On-going           PT Long Term Goals - 06/11/14 1791    PT LONG TERM GOAL #1   Title I in advance HEP   Time 4   Period Weeks   Status On-going   PT LONG TERM GOAL #2   Title Pt to be walking for at lease 15 minutes a day 3x a week   Baseline 6/8- depends on how her day is going, good vs bad day, maybe averages 10 minutes    Time 4   Period Weeks   Status On-going   PT LONG TERM GOAL #3   Title Pt to be able to come sit to stand without use of UE assist, good eccentric contorl with descent    Time 4   Period Weeks   Status Revised   PT LONG TERM GOAL #4   Title Pt to be able to SLS for 10 seconds for reduce risk of falling   Time 4   Period Weeks   Status Achieved   PT LONG TERM  GOAL #5   Title Pt TUG to be 12 or less to demonstrate decreased risk of falling     Time 4   Period Weeks   Status Achieved   PT LONG TERM GOAL #6   Title FACIT-F to have improved by 25 points   Time 4   Period Weeks   Status On-going               Plan - 06/30/14 0847    Clinical Impression Statement (p) Pt treatment focused on stretching and modalities to decrease pain.  Pt Rt LE much tighter than Lt.  Decreased Lt hip ROM is majority of functional limitation.   PT Next Visit Plan (p) concentrate on Lt hip PROM for entire sessions for the next two weeks to see if Lt hip will improve in ROM to improve overall functional ability.         Problem List Patient Active Problem List   Diagnosis Date Noted  . Genetic testing 04/29/2014  . Family history of ovarian cancer   . Family history of colon cancer   . Family history of pancreatic cancer   . Breast cancer 11/04/2013  . Low back pain 11/12/2012    Rayetta Humphrey, PT CLT [phone] 06/30/2014, 1:16 PM  [facility] [address] [place], [place], [address] Phone: [phone]   Fax:  [phone]

## 2014-07-01 ENCOUNTER — Encounter (HOSPITAL_COMMUNITY): Payer: Medicare Other

## 2014-07-01 ENCOUNTER — Encounter (HOSPITAL_COMMUNITY): Payer: Medicare Other | Attending: Hematology and Oncology | Admitting: Hematology & Oncology

## 2014-07-01 ENCOUNTER — Encounter (HOSPITAL_COMMUNITY): Payer: Self-pay | Admitting: Hematology & Oncology

## 2014-07-01 ENCOUNTER — Other Ambulatory Visit (HOSPITAL_COMMUNITY): Payer: Self-pay | Admitting: Hematology & Oncology

## 2014-07-01 VITALS — BP 153/75 | HR 67 | Temp 98.4°F | Resp 16 | Wt 158.0 lb

## 2014-07-01 DIAGNOSIS — Z803 Family history of malignant neoplasm of breast: Secondary | ICD-10-CM

## 2014-07-01 DIAGNOSIS — Z8041 Family history of malignant neoplasm of ovary: Secondary | ICD-10-CM | POA: Diagnosis not present

## 2014-07-01 DIAGNOSIS — I10 Essential (primary) hypertension: Secondary | ICD-10-CM | POA: Insufficient documentation

## 2014-07-01 DIAGNOSIS — C50412 Malignant neoplasm of upper-outer quadrant of left female breast: Secondary | ICD-10-CM

## 2014-07-01 DIAGNOSIS — C50919 Malignant neoplasm of unspecified site of unspecified female breast: Secondary | ICD-10-CM

## 2014-07-01 DIAGNOSIS — Z170 Estrogen receptor positive status [ER+]: Secondary | ICD-10-CM | POA: Diagnosis not present

## 2014-07-01 DIAGNOSIS — C50912 Malignant neoplasm of unspecified site of left female breast: Secondary | ICD-10-CM | POA: Insufficient documentation

## 2014-07-01 DIAGNOSIS — R609 Edema, unspecified: Secondary | ICD-10-CM | POA: Diagnosis not present

## 2014-07-01 DIAGNOSIS — M810 Age-related osteoporosis without current pathological fracture: Secondary | ICD-10-CM | POA: Insufficient documentation

## 2014-07-01 DIAGNOSIS — C50911 Malignant neoplasm of unspecified site of right female breast: Secondary | ICD-10-CM | POA: Diagnosis not present

## 2014-07-01 DIAGNOSIS — G629 Polyneuropathy, unspecified: Secondary | ICD-10-CM

## 2014-07-01 DIAGNOSIS — Z780 Asymptomatic menopausal state: Secondary | ICD-10-CM

## 2014-07-01 DIAGNOSIS — Z171 Estrogen receptor negative status [ER-]: Secondary | ICD-10-CM

## 2014-07-01 DIAGNOSIS — Z95828 Presence of other vascular implants and grafts: Secondary | ICD-10-CM

## 2014-07-01 DIAGNOSIS — R0600 Dyspnea, unspecified: Secondary | ICD-10-CM

## 2014-07-01 LAB — CBC WITH DIFFERENTIAL/PLATELET
BASOS ABS: 0 10*3/uL (ref 0.0–0.1)
BASOS PCT: 1 % (ref 0–1)
EOS PCT: 3 % (ref 0–5)
Eosinophils Absolute: 0.2 10*3/uL (ref 0.0–0.7)
HEMATOCRIT: 40.4 % (ref 36.0–46.0)
Hemoglobin: 13.4 g/dL (ref 12.0–15.0)
Lymphocytes Relative: 22 % (ref 12–46)
Lymphs Abs: 1.4 10*3/uL (ref 0.7–4.0)
MCH: 28.9 pg (ref 26.0–34.0)
MCHC: 33.2 g/dL (ref 30.0–36.0)
MCV: 87.3 fL (ref 78.0–100.0)
MONO ABS: 0.4 10*3/uL (ref 0.1–1.0)
MONOS PCT: 7 % (ref 3–12)
NEUTROS ABS: 4.2 10*3/uL (ref 1.7–7.7)
Neutrophils Relative %: 67 % (ref 43–77)
Platelets: 222 10*3/uL (ref 150–400)
RBC: 4.63 MIL/uL (ref 3.87–5.11)
RDW: 14.5 % (ref 11.5–15.5)
WBC: 6.3 10*3/uL (ref 4.0–10.5)

## 2014-07-01 LAB — COMPREHENSIVE METABOLIC PANEL
ALT: 13 U/L — AB (ref 14–54)
AST: 18 U/L (ref 15–41)
Albumin: 3.8 g/dL (ref 3.5–5.0)
Alkaline Phosphatase: 69 U/L (ref 38–126)
Anion gap: 8 (ref 5–15)
BUN: 14 mg/dL (ref 6–20)
CALCIUM: 8.9 mg/dL (ref 8.9–10.3)
CO2: 28 mmol/L (ref 22–32)
Chloride: 106 mmol/L (ref 101–111)
Creatinine, Ser: 0.68 mg/dL (ref 0.44–1.00)
GFR calc Af Amer: >60 mL/min (ref >60)
Glucose, Bld: 117 mg/dL — ABNORMAL HIGH (ref 65–99)
Potassium: 3.9 mmol/L (ref 3.5–5.1)
Sodium: 142 mmol/L (ref 135–145)
Total Bilirubin: 0.8 mg/dL (ref 0.3–1.2)
Total Protein: 6.6 g/dL (ref 6.5–8.1)

## 2014-07-01 MED ORDER — SODIUM CHLORIDE 0.9 % IJ SOLN
10.0000 mL | INTRAMUSCULAR | Status: DC | PRN
  Administered 2014-07-01: 10 mL via INTRAVENOUS
  Filled 2014-07-01: qty 10

## 2014-07-01 MED ORDER — HEPARIN SOD (PORK) LOCK FLUSH 100 UNIT/ML IV SOLN
INTRAVENOUS | Status: AC
  Filled 2014-07-01: qty 5

## 2014-07-01 MED ORDER — HEPARIN SOD (PORK) LOCK FLUSH 100 UNIT/ML IV SOLN
500.0000 [IU] | Freq: Once | INTRAVENOUS | Status: AC
  Administered 2014-07-01: 500 [IU] via INTRAVENOUS

## 2014-07-01 NOTE — Progress Notes (Signed)
1030:  Tiffany Sweeney presented for Portacath access and flush.  Portacath located right chest wall accessed with  H 20 needle.  Good blood return present. Portacath flushed with 24ml NS and 500U/31ml Heparin and needle removed intact.  Procedure tolerated well and without incident.

## 2014-07-01 NOTE — Patient Instructions (Addendum)
[place] at [facility] Discharge Instructions  RECOMMENDATIONS MADE BY THE CONSULTANT AND ANY TEST RESULTS WILL BE SENT TO YOUR REFERRING PHYSICIAN.  Exam and discussion today with Dr. Whitney Muse. Bone scan in 2 months. Office visit in 3 months.   Thank you for choosing [place] at [facility] to provide your oncology and hematology care.  To afford each patient quality time with our provider, please arrive at least 15 minutes before your scheduled appointment time.    You need to re-schedule your appointment should you arrive 10 or more minutes late.  We strive to give you quality time with our providers, and arriving late affects you and other patients whose appointments are after yours.  Also, if you no show three or more times for appointments you may be dismissed from the clinic at the providers discretion.     Again, thank you for choosing [facility].  Our hope is that these requests will decrease the amount of time that you wait before being seen by our physicians.       _____________________________________________________________  Should you have questions after your visit to [facility], please contact our office at [phone] between the hours of 8:30 a.m. and 4:30 p.m.  Voicemails left after 4:30 p.m. will not be returned until the following business day.  For prescription refill requests, have your pharmacy contact our office.

## 2014-07-01 NOTE — Progress Notes (Signed)
[name], MD [address] [place] [address]  Stage I triple negative carcinoma of the left breast s/p mastectomy and axillary LN disssection. History of Carcinoma of the R breast  CURRENT THERAPY: Observation  INTERVAL HISTORY: Tiffany Sweeney 80 y.o. female returns for follow-up of her breast cancer. She continues to improve. She states she is much better but still has problems with neuropathy. Her left foot is chronically swollen she notes that it gets worse throughout the day but improved in the morning.   The patient is here today with her daughter and has fallen recently.  She tripped over a bag located on the ground and attempted to break her fall with her hands, damaging her right hip and both knees.  She says that she experienced aches and pains across her shoulders a few days after and that the whole occurence has "knocked her back a bit".  She says that she really enjoys the [facility] and her daughter Tamera Stands and states that she does very well with it.  She has notified them of her fall and they have been doing exercises with her.  She presents today for lab work, port flush and ongoing f/u of her breast cancer. The patients says that her appetite is well, she has been trying to eat healthier. She states that her energy is low and that she is very slow. The patient has been traveling with her family.  She says that she is not physically very active during these trips however, she does go shopping and "this where she gets her walking in". Her daughter says that she sees a vast improvement with her. The patient admits to still having trouble with getting in the car and off of the bed. She complains that in the mornings when she awakens, her toes are tight and tingly.  She does exercises before getting up and says that this feeling goes away when she starts moving.  She has consulted her podiatrist She still takes her Calcium and Vitamin  D For her physical exam, she needs assistance get out of the chair and getting on the examination table  The patient denies any chest pain and says that her breathing is getting better  No refills are needed She expresses no other concerns at this time.  MEDICAL HISTORY: Past Medical History  Diagnosis Date  . High cholesterol   . Hypertension   . Anxiety   . Cancer     breast- right  . Arthritis   . Breast cancer   . Family history of ovarian cancer   . Family history of colon cancer   . Family history of pancreatic cancer     has Low back pain; Breast cancer; Family history of ovarian cancer; Family history of colon cancer; Family history of pancreatic cancer; and Genetic testing on her problem list.      Breast cancer   07/25/2013 Imaging Plain film of the lumbar spine, complete 4 view. Chronic lumbar degenerative change most prominent at L4-5. No acute abnormality and no change from prior study   10/21/2013 Initial Diagnosis Left breast cancer, core biopsy, ER negative, PR negative, HER-2/neu negative, Ki-67 of 13%, infiltrating duct cell   11/04/2013 Surgery Left modified radical mastectomy, 1.8 cm infiltrating duct cell carcinoma, ER negative, PR negative, HER-2/neu not overexpressed, 9 lymph nodes negative. Stage TI cN0 M0.   12/06/2013 Imaging Portable chest x-ray showing no pneumothorax no lung edema or consolidation Port-A-Cath tip in the  SVC.   12/23/2013 - 02/03/2014 Chemotherapy Cycle 1 of TC initiated on 12/23/2013, patient completed 3 cycles of prescirbed therapy     has No Known Allergies.  Ms. Friberg does not currently have medications on file.  SURGICAL HISTORY: Past Surgical History  Procedure Laterality Date  . Abdominal hysterectomy    . Mastectomy Right   . Cataract extraction w/phaco Left 03/11/2013    Procedure: CATARACT EXTRACTION PHACO AND INTRAOCULAR LENS PLACEMENT (IOC);  Surgeon: Tonny Branch, MD;  Location: [facility];  Service: Ophthalmology;  Laterality:  Left;  CDE:7.54  . Cataract extraction w/phaco Right 04/15/2013    Procedure: CATARACT EXTRACTION PHACO AND INTRAOCULAR LENS PLACEMENT (IOC);  Surgeon: Tonny Branch, MD;  Location: [facility];  Service: Ophthalmology;  Laterality: Right;  CDE 9.38  . Mastectomy modified radical Left 11/04/2013    Procedure: LEFT MASTECTOMY MODIFIED RADICAL;  Surgeon: Jamesetta So, MD;  Location: [facility];  Service: General;  Laterality: Left;  . Portacath placement Right 12/06/2013    Procedure: INSERTION PORT-A-CATH-Right subclavian;  Surgeon: Jamesetta So, MD;  Location: [facility];  Service: General;  Laterality: Right;    SOCIAL HISTORY: History   Social History  . Marital Status: Widowed    Spouse Name: N/A  . Number of Children: 3  . Years of Education: N/A   Occupational History  . Not on file.   Social History Main Topics  . Smoking status: Former Smoker -- 0.25 packs/day for 30 years    Types: Cigarettes    Start date: 10/10/1956    Quit date: 03/08/1985  . Smokeless tobacco: Never Used     Comment: quit 25 years ago  . Alcohol Use: No  . Drug Use: No  . Sexual Activity: Yes    Birth Control/ Protection: Post-menopausal   Other Topics Concern  . Not on file   Social History Narrative    FAMILY HISTORY: Family History  Problem Relation Age of Onset  . Diabetes Mother   . Rectal cancer Mother 33    died from complications of DM  . Dementia Father     Died at 39 in an accident  . Pancreatic cancer Brother 43  . Lung cancer Sister 61    "small oat cell"  . Ovarian cancer Sister 23  . Cancer Cousin     maternal 1st cousin with either ovarian or uterine  . Colon cancer Cousin     paternal first cousin dx at unknown age    Review of Systems  Constitutional: Positive for malaise/fatigue. Negative for fever, chills and weight loss.  HENT: Negative for congestion, hearing loss, nosebleeds, sore throat and tinnitus.   Eyes: Negative for blurred vision, double vision, pain and discharge.   Respiratory: Negative for cough, hemoptysis, sputum production, shortness of breath and wheezing.   Cardiovascular: Negative for chest pain, palpitations, claudication, leg swelling and PND.  Gastrointestinal: Negative for heartburn, nausea, vomiting, abdominal pain, diarrhea, constipation, blood in stool and melena.  Genitourinary: Negative for dysuria, urgency, frequency and hematuria.  Musculoskeletal: Negative for myalgias, joint pain and falls.       Chronic LE swelling  Skin: Negative for itching and rash.  Neurological: Positive for sensory change and weakness. Negative for dizziness, tingling, tremors, speech change, focal weakness, seizures, loss of consciousness and headaches.  Endo/Heme/Allergies: Does not bruise/bleed easily.  Psychiatric/Behavioral: Negative for depression, suicidal ideas, memory loss and substance abuse. The patient is not nervous/anxious and does not have insomnia.     PHYSICAL  EXAMINATION  ECOG PERFORMANCE STATUS: 1 - Symptomatic but completely ambulatory  Filed Vitals:   07/01/14 0934  BP: 153/75  Pulse: 67  Temp: 98.4 F (36.9 C)  Resp: 16    Physical Exam  Constitutional: She is oriented to person, place, and time and well-developed, well-nourished, and in no distress. hair regrowth HENT:  Head: Normocephalic and atraumatic.  Nose: Nose normal.  Mouth/Throat: Oropharynx is clear and moist. No oropharyngeal exudate.  Eyes: Conjunctivae and EOM are normal. Pupils are equal, round, and reactive to light. Right eye exhibits no discharge. Left eye exhibits no discharge. No scleral icterus.  Neck: Normal range of motion. Neck supple. No tracheal deviation present. No thyromegaly present.  Cardiovascular: Normal rate, regular rhythm and normal heart sounds.  Exam reveals no gallop and no friction rub.   No murmur heard. Pulmonary/Chest: Effort normal and breath sounds normal. She has no wheezes. She has no rales.  Abdominal: Soft. Bowel sounds are  normal. She exhibits no distension and no mass. There is no tenderness. There is no rebound and no guarding.  Musculoskeletal: Normal range of motion. She exhibits no edema.  Left lower extremity ankle swelling is chronic  Bilateral bruising on both knees Lymphadenopathy:    She has no cervical adenopathy.  Neurological: She is alert and oriented to person, place, and time. She has normal reflexes. No cranial nerve deficit. Coordination normal.  Skin: Skin is warm and dry. No rash noted.  Psychiatric: Mood, memory, affect and judgment normal.  Nursing note and vitals reviewed.   LABORATORY DATA:  CBC    Component Value Date/Time   WBC 6.5 03/25/2014 1300   WBC 6.7 10/10/2013 1010   WBC 7.9 07/06/2006 1141   RBC 4.05 03/25/2014 1300   RBC 4.70 10/10/2013 1010   RBC 4.45 07/06/2006 1141   HGB 11.9* 03/25/2014 1300   HGB 14.0 10/10/2013 1010   HGB 13.0 07/06/2006 1141   HCT 36.9 03/25/2014 1300   HCT 41.8 10/10/2013 1010   HCT 38.2 07/06/2006 1141   PLT 231 03/25/2014 1300   PLT 220 10/10/2013 1010   PLT 272 07/06/2006 1141   MCV 91.1 03/25/2014 1300   MCV 89 10/10/2013 1010   MCV 85.8 07/06/2006 1141   MCH 29.4 03/25/2014 1300   MCH 29.8 10/10/2013 1010   MCH 29.3 07/06/2006 1141   MCHC 32.2 03/25/2014 1300   MCHC 33.5 10/10/2013 1010   MCHC 34.1 07/06/2006 1141   RDW 14.2 03/25/2014 1300   RDW 13.3 10/10/2013 1010   RDW 11.2* 07/06/2006 1141   LYMPHSABS 1.4 03/25/2014 1300   LYMPHSABS 2.0 10/10/2013 1010   LYMPHSABS 2.1 07/06/2006 1141   MONOABS 0.7 03/25/2014 1300   MONOABS 0.6 07/06/2006 1141   EOSABS 0.3 03/25/2014 1300   EOSABS 0.1 10/10/2013 1010   EOSABS 0.1 07/06/2006 1141   BASOSABS 0.0 03/25/2014 1300   BASOSABS 0.0 10/10/2013 1010   BASOSABS 0.1 07/06/2006 1141   CMP     Component Value Date/Time   NA 141 03/25/2014 1300   K 3.7 03/25/2014 1300   CL 108 03/25/2014 1300   CO2 27 03/25/2014 1300   GLUCOSE 77 03/25/2014 1300   BUN 15 03/25/2014  1300   CREATININE 0.68 03/25/2014 1300   CALCIUM 8.8 03/25/2014 1300   PROT 6.4 03/25/2014 1300   ALBUMIN 3.6 03/25/2014 1300   AST 18 03/25/2014 1300   ALT 12 03/25/2014 1300   ALKPHOS 64 03/25/2014 1300   BILITOT 0.6 03/25/2014 1300  GFRNONAA 82* 03/25/2014 1300   GFRAA >90 03/25/2014 1300    ASSESSMENT and THERAPY PLAN:   Stage I triple negative carcinoma of the left breast Mastectomy 3 cycles of Taxotere and Cytoxan, discontinued prior to cycle 4 secondary to excessive fatigue, hand/foot syndrome and worsening neuropathy History of breast cancer Family history of ovarian cancer   I advised the patient I will call her when her blood work is available. She should certainly continue in the [facility] she has made noticeable improvements but needs ongoing therapy. There is no obvious evidence of recurrent breast cancer and we will continue with observation in accordance with guidelines. I discussed with her that ongoing laboratory studies are also really not necessary.  She notes she had evidence of osteopenia on a DEXA several years ago I have recommended repeating her DEXA when she is agreeable to do it at her next follow-up appointment.  All questions were answered. The patient knows to call the clinic with any problems, questions or concerns. We can certainly see the patient much sooner if necessary. //  This note was electronically signed.  This document serves as a record of services personally performed by Ancil Linsey, MD. It was created on her behalf by Janace Hoard, a trained medical scribe. The creation of this record is based on the scribe's personal observations and the provider's statements to them. This document has been checked and approved by the attending provider.  I have reviewed the above documentation for accuracy and completeness, and I agree with the above.  Kelby Fam. Whitney Muse, MD

## 2014-07-01 NOTE — Progress Notes (Signed)
Please see doctors encounter for more information 

## 2014-07-02 ENCOUNTER — Ambulatory Visit (HOSPITAL_COMMUNITY): Payer: Medicare Other | Admitting: Physical Therapy

## 2014-07-02 DIAGNOSIS — M5442 Lumbago with sciatica, left side: Secondary | ICD-10-CM

## 2014-07-02 DIAGNOSIS — M545 Low back pain, unspecified: Secondary | ICD-10-CM

## 2014-07-02 DIAGNOSIS — R5383 Other fatigue: Secondary | ICD-10-CM

## 2014-07-02 DIAGNOSIS — M5441 Lumbago with sciatica, right side: Secondary | ICD-10-CM

## 2014-07-02 DIAGNOSIS — M25659 Stiffness of unspecified hip, not elsewhere classified: Secondary | ICD-10-CM

## 2014-07-03 NOTE — Therapy (Signed)
[place], [place], [address] Phone: [phone]   Fax:  [phone]  Physical Therapy Treatment  Patient Details  Name: Tiffany Sweeney MRN: [id number] Date of Birth: 1934/11/01 Referring Provider:  Patrici Ranks, MD  Encounter Date: 07/02/2014      PT End of Session - 07/03/14 0909    Visit Number 25   Number of Visits 32   Date for PT Re-Evaluation 07/09/14   Authorization Type medicare    Authorization - Visit Number 25   Authorization - Number of Visits 30   PT Start Time 0800   PT Stop Time 0845   PT Time Calculation (min) 45 min   Activity Tolerance Other (comment);Patient tolerated treatment well  Pt demonstrated anxiety and weepiness in today's tx as a result of annivesary of daughter's death   Behavior During Therapy WFL for tasks assessed/performed      Past Medical History  Diagnosis Date  . High cholesterol   . Hypertension   . Anxiety   . Cancer     breast- right  . Arthritis   . Breast cancer   . Family history of ovarian cancer   . Family history of colon cancer   . Family history of pancreatic cancer     Past Surgical History  Procedure Laterality Date  . Abdominal hysterectomy    . Mastectomy Right   . Cataract extraction w/phaco Left 03/11/2013    Procedure: CATARACT EXTRACTION PHACO AND INTRAOCULAR LENS PLACEMENT (IOC);  Surgeon: Tonny Branch, MD;  Location: [facility];  Service: Ophthalmology;  Laterality: Left;  CDE:7.54  . Cataract extraction w/phaco Right 04/15/2013    Procedure: CATARACT EXTRACTION PHACO AND INTRAOCULAR LENS PLACEMENT (IOC);  Surgeon: Tonny Branch, MD;  Location: [facility];  Service: Ophthalmology;  Laterality: Right;  CDE 9.38  . Mastectomy modified radical Left 11/04/2013    Procedure: LEFT MASTECTOMY MODIFIED RADICAL;  Surgeon: Jamesetta So, MD;  Location: [facility];  Service: General;  Laterality: Left;  . Portacath placement Right 12/06/2013    Procedure:  INSERTION PORT-A-CATH-Right subclavian;  Surgeon: Jamesetta So, MD;  Location: [facility];  Service: General;  Laterality: Right;    There were no vitals filed for this visit.  Visit Diagnosis:  Stiffness of hip joint, unspecified laterality  Midline low back pain without sciatica  Fatigue due to treatment  Bilateral low back pain without sciatica  Midline low back pain with left-sided sciatica  Right-sided low back pain with right-sided sciatica      Subjective Assessment - 07/03/14 0903    Subjective Pt states she is stilll sore from her fall last week in her knees, back and neck.   Pain Score 7   Pain Location Knee   Pain Orientation Right;Left                         [facility] Adult PT Treatment/Exercise - 07/03/14 0001    Lumbar Exercises: Supine   Heel Slides 10 reps   Bent Knee Raise 10 reps   Knee/Hip Exercises: Standing   Other Standing Knee Exercises Adductor stretch x5   Knee/Hip Exercises: Supine   Quad Sets Right;Left;10 reps   Bridges 10 reps   Modalities   Modalities Moist Heat   Moist Heat Therapy   Number Minutes Moist Heat 20 Minutes   Moist Heat Location Cervical    nustep 8 minutes  PT Short Term Goals - 06/11/14 0831    PT SHORT TERM GOAL #1   Title Pt to be I in HEP   Time 1   Status Achieved   PT SHORT TERM GOAL #2   Title Pt to have stated that she has started a walking program at home   Baseline 5/13- has good days and bad days, but is walking regularly; not doing long distances right now due to pain in hips and feet    Time 2   Period Weeks   Status Achieved   PT SHORT TERM GOAL #3   Title Patient will demonstrate the efficiency of her car transfers, will be able to perform with independence and no fatigue, no difficulty with transfer    Baseline 6/8- still needs a little bit of help with this    Time 4   Period Weeks   Status On-going   PT SHORT TERM GOAL #4   Title Patient will demonstrate the  ability to pick an object up from the floor with more ease secondary to have gained at least 15 degrees lumbar flexion    Time 4   Period Weeks   Status On-going   PT SHORT TERM GOAL #5   Title Patient will demonstrate the ability to ambulate at least 1441ft during 6 minute walk and will be able to tolerate at least 15 minutes of exericse on Nustep secondary to improved functional activity tolerance    Baseline 6/8- 1069 ft during 6MWT   Time 4   Period Weeks   Status On-going           PT Long Term Goals - 06/11/14 0277    PT LONG TERM GOAL #1   Title I in advance HEP   Time 4   Period Weeks   Status On-going   PT LONG TERM GOAL #2   Title Pt to be walking for at lease 15 minutes a day 3x a week   Baseline 6/8- depends on how her day is going, good vs bad day, maybe averages 10 minutes    Time 4   Period Weeks   Status On-going   PT LONG TERM GOAL #3   Title Pt to be able to come sit to stand without use of UE assist, good eccentric contorl with descent    Time 4   Period Weeks   Status Revised   PT LONG TERM GOAL #4   Title Pt to be able to SLS for 10 seconds for reduce risk of falling   Time 4   Period Weeks   Status Achieved   PT LONG TERM GOAL #5   Title Pt TUG to be 12 or less to demonstrate decreased risk of falling    Time 4   Period Weeks   Status Achieved   PT LONG TERM GOAL #6   Title FACIT-F to have improved by 25 points   Time 4   Period Weeks   Status On-going               Plan - 07/03/14 0910    Clinical Impression Statement Pt with continued pain in knees and cervical area due to recent fall.  Pt hypersensitive to touch on her knees and reports the heat really helped her cervical area last session.  Continued with NWB exercisess with patinent about to completre without difficulty.     PT Next Visit Plan Continue with functional squatting with lower surfaces to enable pt to  retrieve items from floor. Begin hurdles, as they were not addressed  this treatment.         Problem List Patient Active Problem List   Diagnosis Date Noted  . Genetic testing 04/29/2014  . Family history of ovarian cancer   . Family history of colon cancer   . Family history of pancreatic cancer   . Breast cancer 11/04/2013  . Low back pain 11/12/2012   Teena Irani, PTA/CLT [phone]  07/03/2014, 9:14 AM  [facility] [address] [place], [place], [address] Phone: [phone]   Fax:  [phone]

## 2014-07-04 ENCOUNTER — Ambulatory Visit (HOSPITAL_COMMUNITY): Payer: Medicare Other | Attending: Hematology & Oncology | Admitting: Physical Therapy

## 2014-07-04 DIAGNOSIS — M545 Low back pain, unspecified: Secondary | ICD-10-CM

## 2014-07-04 DIAGNOSIS — M5441 Lumbago with sciatica, right side: Secondary | ICD-10-CM | POA: Insufficient documentation

## 2014-07-04 DIAGNOSIS — R5383 Other fatigue: Secondary | ICD-10-CM | POA: Insufficient documentation

## 2014-07-04 DIAGNOSIS — M25659 Stiffness of unspecified hip, not elsewhere classified: Secondary | ICD-10-CM | POA: Diagnosis not present

## 2014-07-04 DIAGNOSIS — M5442 Lumbago with sciatica, left side: Secondary | ICD-10-CM | POA: Diagnosis present

## 2014-07-04 NOTE — Therapy (Signed)
[place], [place], [address] Phone: [phone]   Fax:  [phone]  Physical Therapy Treatment  Patient Details  Name: Tiffany Sweeney MRN: [id number] Date of Birth: 1934-11-06 Referring Provider:  Patrici Ranks, MD  Encounter Date: 07/04/2014      PT End of Session - 07/04/14 0930    Visit Number 26   Number of Visits 32   Date for PT Re-Evaluation 07/09/14   Authorization Type medicare    Authorization Time Period Gcode complete 20th session    Authorization - Visit Number 26   Authorization - Number of Visits 30   PT Start Time 0802   PT Stop Time 4481   PT Time Calculation (min) 52 min   Activity Tolerance Patient tolerated treatment well      Past Medical History  Diagnosis Date  . High cholesterol   . Hypertension   . Anxiety   . Cancer     breast- right  . Arthritis   . Breast cancer   . Family history of ovarian cancer   . Family history of colon cancer   . Family history of pancreatic cancer     Past Surgical History  Procedure Laterality Date  . Abdominal hysterectomy    . Mastectomy Right   . Cataract extraction w/phaco Left 03/11/2013    Procedure: CATARACT EXTRACTION PHACO AND INTRAOCULAR LENS PLACEMENT (IOC);  Surgeon: Tonny Branch, MD;  Location: [facility];  Service: Ophthalmology;  Laterality: Left;  CDE:7.54  . Cataract extraction w/phaco Right 04/15/2013    Procedure: CATARACT EXTRACTION PHACO AND INTRAOCULAR LENS PLACEMENT (IOC);  Surgeon: Tonny Branch, MD;  Location: [facility];  Service: Ophthalmology;  Laterality: Right;  CDE 9.38  . Mastectomy modified radical Left 11/04/2013    Procedure: LEFT MASTECTOMY MODIFIED RADICAL;  Surgeon: Jamesetta So, MD;  Location: [facility];  Service: General;  Laterality: Left;  . Portacath placement Right 12/06/2013    Procedure: INSERTION PORT-A-CATH-Right subclavian;  Surgeon: Jamesetta So, MD;  Location: [facility];  Service: General;  Laterality:  Right;    There were no vitals filed for this visit.  Visit Diagnosis:  Stiffness of hip joint, unspecified laterality  Midline low back pain without sciatica  Fatigue due to treatment  Bilateral low back pain without sciatica      Subjective Assessment - 07/04/14 0851    Subjective Pt states that her shoulders are doing quite a bit better but her knees are still sore.  States she went to MD who wants her to continue therapy due to pt and daughter seeing a significant improvement.    Pain Score 5                          [facility] Adult PT Treatment/Exercise - 07/04/14 0001    Lumbar Exercises: Standing   Heel Raises 10 reps   Functional Squats 10 reps   Forward Lunge 10 reps   Forward Lunge Limitations 8"    Lumbar Exercises: Supine   Heel Slides --   Bent Knee Raise --   Other Supine Lumbar Exercises PROM for flexion, abduction, IR and ER with 5 minute holds to Rt LE followed by contract/relax to increase range.    Knee/Hip Exercises: Aerobic   Stationary Bike nustep L4 x 15 "   Knee/Hip Exercises: Standing   Other Standing Knee Exercises Adductor stretch x5   Knee/Hip Exercises: Supine   Quad  Sets Right;Left;10 reps   Bridges 10 reps   Modalities   Modalities Moist Heat   Moist Heat Therapy   Moist Heat Location Cervical   Manual Therapy   Manual Therapy Joint mobilization   Joint Mobilization Rt HIP                   PT Short Term Goals - 06/11/14 0831    PT SHORT TERM GOAL #1   Title Pt to be I in HEP   Time 1   Status Achieved   PT SHORT TERM GOAL #2   Title Pt to have stated that she has started a walking program at home   Baseline 5/13- has good days and bad days, but is walking regularly; not doing long distances right now due to pain in hips and feet    Time 2   Period Weeks   Status Achieved   PT SHORT TERM GOAL #3   Title Patient will demonstrate the efficiency of her car transfers, will be able to perform with independence  and no fatigue, no difficulty with transfer    Baseline 6/8- still needs a little bit of help with this    Time 4   Period Weeks   Status On-going   PT SHORT TERM GOAL #4   Title Patient will demonstrate the ability to pick an object up from the floor with more ease secondary to have gained at least 15 degrees lumbar flexion    Time 4   Period Weeks   Status On-going   PT SHORT TERM GOAL #5   Title Patient will demonstrate the ability to ambulate at least 1445ft during 6 minute walk and will be able to tolerate at least 15 minutes of exericse on Nustep secondary to improved functional activity tolerance    Baseline 6/8- 1069 ft during 6MWT   Time 4   Period Weeks   Status On-going           PT Long Term Goals - 06/11/14 1610    PT LONG TERM GOAL #1   Title I in advance HEP   Time 4   Period Weeks   Status On-going   PT LONG TERM GOAL #2   Title Pt to be walking for at lease 15 minutes a day 3x a week   Baseline 6/8- depends on how her day is going, good vs bad day, maybe averages 10 minutes    Time 4   Period Weeks   Status On-going   PT LONG TERM GOAL #3   Title Pt to be able to come sit to stand without use of UE assist, good eccentric contorl with descent    Time 4   Period Weeks   Status Revised   PT LONG TERM GOAL #4   Title Pt to be able to SLS for 10 seconds for reduce risk of falling   Time 4   Period Weeks   Status Achieved   PT LONG TERM GOAL #5   Title Pt TUG to be 12 or less to demonstrate decreased risk of falling    Time 4   Period Weeks   Status Achieved   PT LONG TERM GOAL #6   Title FACIT-F to have improved by 25 points   Time 4   Period Weeks   Status On-going               Plan - 07/04/14 0931    Clinical Impression Statement Treatment focused  on improving ROM of Rt hip for more functional use.  Pt able to put LT foot on tolient to assist in donning socks and shoes now but is unable to do sowith her Rt.  Treatment to continue to  concentrate for at least 20 minutes on manual to Rt hip followed by functional stretch and strengtheniing .    PT Next Visit Plan continue to emphasis ROM of Rt hip over strength .        Problem List Patient Active Problem List   Diagnosis Date Noted  . Genetic testing 04/29/2014  . Family history of ovarian cancer   . Family history of colon cancer   . Family history of pancreatic cancer   . Breast cancer 11/04/2013  . Low back pain 11/12/2012   Rayetta Humphrey, PT CLT [phone] 07/04/2014, 9:35 AM  [place], [place], [address] Phone: [phone]   Fax:  [phone]

## 2014-07-08 ENCOUNTER — Encounter (HOSPITAL_COMMUNITY): Payer: Medicare Other

## 2014-07-08 ENCOUNTER — Ambulatory Visit (HOSPITAL_COMMUNITY): Payer: Medicare Other | Admitting: Physical Therapy

## 2014-07-08 DIAGNOSIS — M25659 Stiffness of unspecified hip, not elsewhere classified: Secondary | ICD-10-CM

## 2014-07-08 DIAGNOSIS — R5383 Other fatigue: Secondary | ICD-10-CM

## 2014-07-08 DIAGNOSIS — M545 Low back pain, unspecified: Secondary | ICD-10-CM

## 2014-07-08 NOTE — Therapy (Addendum)
[place], [place], [address] Phone: [phone]   Fax:  [phone]  Physical Therapy Treatment  Patient Details  Name: Tiffany Sweeney MRN: [id number] Date of Birth: [birth year] Referring Provider:  Patrici Ranks, MD  Encounter Date: 07/08/2014      PT End of Session - 07/08/14 0942    Visit Number 27   Number of Visits 32   Date for PT Re-Evaluation 07/09/14   Authorization - Visit Number 74   Authorization - Number of Visits 30   PT Start Time 0809   PT Stop Time 0910   PT Time Calculation (min) 61 min      Past Medical History  Diagnosis Date  . High cholesterol   . Hypertension   . Anxiety   . Cancer     breast- right  . Arthritis   . Breast cancer   . Family history of ovarian cancer   . Family history of colon cancer   . Family history of pancreatic cancer     Past Surgical History  Procedure Laterality Date  . Abdominal hysterectomy    . Mastectomy Right   . Cataract extraction w/phaco Left 03/11/2013    Procedure: CATARACT EXTRACTION PHACO AND INTRAOCULAR LENS PLACEMENT (IOC);  Surgeon: Tonny Branch, MD;  Location: [facility];  Service: Ophthalmology;  Laterality: Left;  CDE:7.54  . Cataract extraction w/phaco Right 04/15/2013    Procedure: CATARACT EXTRACTION PHACO AND INTRAOCULAR LENS PLACEMENT (IOC);  Surgeon: Tonny Branch, MD;  Location: [facility];  Service: Ophthalmology;  Laterality: Right;  CDE 9.38  . Mastectomy modified radical Left 11/04/2013    Procedure: LEFT MASTECTOMY MODIFIED RADICAL;  Surgeon: Jamesetta So, MD;  Location: [facility];  Service: General;  Laterality: Left;  . Portacath placement Right 12/06/2013    Procedure: INSERTION PORT-A-CATH-Right subclavian;  Surgeon: Jamesetta So, MD;  Location: [facility];  Service: General;  Laterality: Right;    There were no vitals filed for this visit.  Visit Diagnosis:  Stiffness of hip joint, unspecified laterality  Midline low back pain  without sciatica  Fatigue due to treatment  Bilateral low back pain without sciatica      Subjective Assessment - 07/08/14 0832    Subjective Pt states that her shoulders are better, her Rt knee is better but her Lt knee is still sore. Main complaint is lack energy.    Currently in Pain? Yes   Pain Score 3    Pain Location Groin   Pain Orientation Left   Pain Type Chronic pain                [facility] Adult PT Treatment/Exercise - 07/08/14 0001    Lumbar Exercises: Supine   Other Supine Lumbar Exercises PROM for flexion, abduction, IR and ER with 5 minute holds to Rt LE followed by contract/relax to increase range.   HMP placed on Rt hip during stretching.    Knee/Hip Exercises: Aerobic   Stationary Bike nustep L4 x 15 "   Knee/Hip Exercises: Machines for Strengthening   Cybex Knee Extension 2.0  pl x 15   Cybex Knee Flexion 3.5pl x 15   Cybex Leg Press 4 Pl x 15    Hip Cybex arms press 2 Pl x 10                 PT Education - 07/08/14 0942    Education provided Yes   Education Details Encouraged to join  [employer]   Person(s) Educated Patient   Methods Explanation   Comprehension Verbalized understanding          PT Short Term Goals - 06/11/14 0831    PT SHORT TERM GOAL #1   Title Pt to be I in HEP   Time 1   Status Achieved   PT SHORT TERM GOAL #2   Title Pt to have stated that she has started a walking program at home   Baseline 5/13- has good days and bad days, but is walking regularly; not doing long distances right now due to pain in hips and feet    Time 2   Period Weeks   Status Achieved   PT SHORT TERM GOAL #3   Title Patient will demonstrate the efficiency of her car transfers, will be able to perform with independence and no fatigue, no difficulty with transfer    Baseline 6/8- still needs a little bit of help with this    Time 4   Period Weeks   Status On-going   PT SHORT TERM GOAL #4   Title Patient will demonstrate the ability to pick an  object up from the floor with more ease secondary to have gained at least 15 degrees lumbar flexion    Time 4   Period Weeks   Status On-going   PT SHORT TERM GOAL #5   Title Patient will demonstrate the ability to ambulate at least 1421ft during 6 minute walk and will be able to tolerate at least 15 minutes of exericse on Nustep secondary to improved functional activity tolerance    Baseline 6/8- 1069 ft during 6MWT   Time 4   Period Weeks   Status On-going           PT Long Term Goals - 06/11/14 6045    PT LONG TERM GOAL #1   Title I in advance HEP   Time 4   Period Weeks   Status On-going   PT LONG TERM GOAL #2   Title Pt to be walking for at lease 15 minutes a day 3x a week   Baseline 6/8- depends on how her day is going, good vs bad day, maybe averages 10 minutes    Time 4   Period Weeks   Status On-going   PT LONG TERM GOAL #3   Title Pt to be able to come sit to stand without use of UE assist, good eccentric contorl with descent    Time 4   Period Weeks   Status Revised   PT LONG TERM GOAL #4   Title Pt to be able to SLS for 10 seconds for reduce risk of falling   Time 4   Period Weeks   Status Achieved   PT LONG TERM GOAL #5   Title Pt TUG to be 12 or less to demonstrate decreased risk of falling    Time 4   Period Weeks   Status Achieved   PT LONG TERM GOAL #6   Title FACIT-F to have improved by 25 points   Time 4   Period Weeks   Status On-going               Plan - 07/08/14 4098    Clinical Impression Statement Pt treatment focused on stretching Rt hip as well as beginning overall strengthening to improve overall endurance.  Pt stated interest in JAS brace for hip.    PT Treatment/Interventions Therapeutic activities;Therapeutic exercise;Balance training;Functional mobility training;Patient/family education;Manual techniques  PT Next Visit Plan Began cybex machines to emphasis the benefit of a gym membership.  Continue with both stretching and  strengthening.  Give pt t-band for core strengtnening at home.         Problem List Patient Active Problem List   Diagnosis Date Noted  . Genetic testing 04/29/2014  . Family history of ovarian cancer   . Family history of colon cancer   . Family history of pancreatic cancer   . Breast cancer 11/04/2013  . Low back pain 11/12/2012   Rayetta Humphrey, PT CLT [phone] 07/08/2014, 9:46 AM  [place], [place], [address] Phone: [phone]   Fax:  [phone]

## 2014-07-09 ENCOUNTER — Ambulatory Visit (HOSPITAL_COMMUNITY): Payer: Medicare Other | Admitting: Physical Therapy

## 2014-07-09 DIAGNOSIS — M25659 Stiffness of unspecified hip, not elsewhere classified: Secondary | ICD-10-CM | POA: Diagnosis not present

## 2014-07-09 DIAGNOSIS — M5441 Lumbago with sciatica, right side: Secondary | ICD-10-CM

## 2014-07-09 DIAGNOSIS — M545 Low back pain, unspecified: Secondary | ICD-10-CM

## 2014-07-09 DIAGNOSIS — R5383 Other fatigue: Secondary | ICD-10-CM

## 2014-07-09 DIAGNOSIS — M5442 Lumbago with sciatica, left side: Secondary | ICD-10-CM

## 2014-07-09 NOTE — Therapy (Signed)
Tiffany Sweeney, [place], [address] Phone: [phone]   Fax:  [phone]  Physical Therapy Treatment  Patient Details  Name: Tiffany Sweeney MRN: [id number] Date of Birth: [birth year] Referring Provider:  Patrici Ranks, MD  Encounter Date: 07/09/2014      PT End of Session - 07/09/14 1428    Visit Number 28   Number of Visits 32   Date for PT Re-Evaluation 07/14/14   Authorization - Visit Number 38   Authorization - Number of Visits 30   PT Start Time 0805   PT Stop Time 7673   PT Time Calculation (min) 50 min   Activity Tolerance Patient tolerated treatment well   Behavior During Therapy [facility] for tasks assessed/performed      Past Medical History  Diagnosis Date  . High cholesterol   . Hypertension   . Anxiety   . Cancer     breast- right  . Arthritis   . Breast cancer   . Family history of ovarian cancer   . Family history of colon cancer   . Family history of pancreatic cancer     Past Surgical History  Procedure Laterality Date  . Abdominal hysterectomy    . Mastectomy Right   . Cataract extraction w/phaco Left 03/11/2013    Procedure: CATARACT EXTRACTION PHACO AND INTRAOCULAR LENS PLACEMENT (IOC);  Surgeon: Tonny Branch, MD;  Location: [facility];  Service: Ophthalmology;  Laterality: Left;  CDE:7.54  . Cataract extraction w/phaco Right 04/15/2013    Procedure: CATARACT EXTRACTION PHACO AND INTRAOCULAR LENS PLACEMENT (IOC);  Surgeon: Tonny Branch, MD;  Location: [facility];  Service: Ophthalmology;  Laterality: Right;  CDE 9.38  . Mastectomy modified radical Left 11/04/2013    Procedure: LEFT MASTECTOMY MODIFIED RADICAL;  Surgeon: Jamesetta So, MD;  Location: [facility];  Service: General;  Laterality: Left;  . Portacath placement Right 12/06/2013    Procedure: INSERTION PORT-A-CATH-Right subclavian;  Surgeon: Jamesetta So, MD;  Location: [facility];  Service: General;  Laterality: Right;    There were no vitals  filed for this visit.  Visit Diagnosis:  Stiffness of hip joint, unspecified laterality  Midline low back pain without sciatica  Fatigue due to treatment  Bilateral low back pain without sciatica  Midline low back pain with left-sided sciatica  Right-sided low back pain with right-sided sciatica      Subjective Assessment - 07/09/14 1427    Subjective pt states she was sore after last session.  States she is stil fatigued;   Currently in Pain? Yes   Pain Score 3    Pain Location Leg   Pain Orientation Left                         [facility] Adult PT Treatment/Exercise - 07/09/14 0812    Lumbar Exercises: Stretches   Active Hamstring Stretch 3 reps;20 seconds   Active Hamstring Stretch Limitations on last step Bilat LE   Hip Flexor Stretch 3 reps;20 seconds   Hip Flexor Stretch Limitations last step bilat LE   Piriformis Stretch 3 reps;20 seconds   Piriformis Stretch Limitations seated with leg on mat, not crossed   Lumbar Exercises: Aerobic   Stationary Bike nustep level 4 x10 minutes at end of session   Lumbar Exercises: Supine   Other Supine Lumbar Exercises PROM for flexion, abduction, IR and ER Rt LE   Knee/Hip Exercises: Aerobic   Stationary Bike nustep  L4 x 15 "   Knee/Hip Exercises: Machines for Strengthening   Cybex Knee Extension 2.0  pl x 20   Cybex Knee Flexion 3.5pl x 20   Cybex Leg Press 4 Pl x 20   Hip Cybex arms press 2 Pl x 20                 PT Education - 07/08/14 0942    Education provided Yes   Education Details Encouraged to join [employer]) Educated Patient   Methods Explanation   Comprehension Verbalized understanding          PT Short Term Goals - 06/11/14 0831    PT SHORT TERM GOAL #1   Title Pt to be I in HEP   Time 1   Status Achieved   PT SHORT TERM GOAL #2   Title Pt to have stated that she has started a walking program at home   Baseline 5/13- has good days and bad days, but is walking regularly;  not doing long distances right now due to pain in hips and feet    Time 2   Period Weeks   Status Achieved   PT SHORT TERM GOAL #3   Title Patient will demonstrate the efficiency of her car transfers, will be able to perform with independence and no fatigue, no difficulty with transfer    Baseline 6/8- still needs a little bit of help with this    Time 4   Period Weeks   Status On-going   PT SHORT TERM GOAL #4   Title Patient will demonstrate the ability to pick an object up from the floor with more ease secondary to have gained at least 15 degrees lumbar flexion    Time 4   Period Weeks   Status On-going   PT SHORT TERM GOAL #5   Title Patient will demonstrate the ability to ambulate at least 1440ft during 6 minute walk and will be able to tolerate at least 15 minutes of exericse on Nustep secondary to improved functional activity tolerance    Baseline 6/8- 1069 ft during 6MWT   Time 4   Period Weeks   Status On-going           PT Long Term Goals - 06/11/14 5631    PT LONG TERM GOAL #1   Title I in advance HEP   Time 4   Period Weeks   Status On-going   PT LONG TERM GOAL #2   Title Pt to be walking for at lease 15 minutes a day 3x a week   Baseline 6/8- depends on how her day is going, good vs bad day, maybe averages 10 minutes    Time 4   Period Weeks   Status On-going   PT LONG TERM GOAL #3   Title Pt to be able to come sit to stand without use of UE assist, good eccentric contorl with descent    Time 4   Period Weeks   Status Revised   PT LONG TERM GOAL #4   Title Pt to be able to SLS for 10 seconds for reduce risk of falling   Time 4   Period Weeks   Status Achieved   PT LONG TERM GOAL #5   Title Pt TUG to be 12 or less to demonstrate decreased risk of falling    Time 4   Period Weeks   Status Achieved   PT LONG TERM GOAL #6   Title  FACIT-F to have improved by 25 points   Time 4   Period Weeks   Status On-going               Plan - 07/09/14  1429    Clinical Impression Statement Continued with focus on educating with actvities to complete independently at home.  Instructed with self stretches and continued with strengthening exercises with cybex for carry over to [facility].  Pt able to complete all with some assistance with Rt LE and rests as needed.    PT Next Visit Plan Continue with both stretching and strengthening with emphasis on completing with HEP.  Re-evaluate X 2 more sessions.        Problem List Patient Active Problem List   Diagnosis Date Noted  . Genetic testing 04/29/2014  . Family history of ovarian cancer   . Family history of colon cancer   . Family history of pancreatic cancer   . Breast cancer 11/04/2013  . Low back pain 11/12/2012    Teena Irani, PTA/CLT [phone]  07/09/2014, 2:32 PM  [place] [address] [place], [place], [address] Phone: [phone]   Fax:  [phone]

## 2014-07-11 ENCOUNTER — Ambulatory Visit (HOSPITAL_COMMUNITY): Payer: Medicare Other

## 2014-07-11 DIAGNOSIS — M25659 Stiffness of unspecified hip, not elsewhere classified: Secondary | ICD-10-CM | POA: Diagnosis not present

## 2014-07-11 DIAGNOSIS — R5383 Other fatigue: Secondary | ICD-10-CM

## 2014-07-11 DIAGNOSIS — M545 Low back pain, unspecified: Secondary | ICD-10-CM

## 2014-07-11 DIAGNOSIS — M5442 Lumbago with sciatica, left side: Secondary | ICD-10-CM

## 2014-07-11 DIAGNOSIS — M5441 Lumbago with sciatica, right side: Secondary | ICD-10-CM

## 2014-07-11 NOTE — Therapy (Signed)
[place], [place], [address] Phone: [phone]   Fax:  [phone]  Physical Therapy Treatment  Patient Details  Name: Tiffany Sweeney MRN: [id number] Date of Birth: 1934/03/16 Referring Provider:  Patrici Ranks, MD  Encounter Date: 07/11/2014      PT End of Session - 07/11/14 0809    Visit Number 29   Number of Visits 32   Date for PT Re-Evaluation 07/14/14   Authorization Type medicare    Authorization Time Period Gcode complete 20th session    Authorization - Visit Number 29   Authorization - Number of Visits 30   PT Start Time 0805   PT Stop Time 0856   PT Time Calculation (min) 51 min   Activity Tolerance Patient tolerated treatment well   Behavior During Therapy [facility] for tasks assessed/performed      Past Medical History  Diagnosis Date  . High cholesterol   . Hypertension   . Anxiety   . Cancer     breast- right  . Arthritis   . Breast cancer   . Family history of ovarian cancer   . Family history of colon cancer   . Family history of pancreatic cancer     Past Surgical History  Procedure Laterality Date  . Abdominal hysterectomy    . Mastectomy Right   . Cataract extraction w/phaco Left 03/11/2013    Procedure: CATARACT EXTRACTION PHACO AND INTRAOCULAR LENS PLACEMENT (IOC);  Surgeon: Tonny Branch, MD;  [facility];  Service: Ophthalmology;  Laterality: Left;  CDE:7.54  . Cataract extraction w/phaco Right 04/15/2013    Procedure: CATARACT EXTRACTION PHACO AND INTRAOCULAR LENS PLACEMENT (IOC);  Surgeon: Tonny Branch, MD;  Location: [facility];  Service: Ophthalmology;  Laterality: Right;  CDE 9.38  . Mastectomy modified radical Left 11/04/2013    Procedure: LEFT MASTECTOMY MODIFIED RADICAL;  Surgeon: Jamesetta So, MD;  Location: [facility];  Service: General;  Laterality: Left;  . Portacath placement Right 12/06/2013    Procedure: INSERTION PORT-A-CATH-Right subclavian;  Surgeon: Jamesetta So, MD;  Location: [facility];  Service: General;  Laterality: Right;    There were no vitals filed for this visit.  Visit Diagnosis:  Stiffness of hip joint, unspecified laterality  Midline low back pain without sciatica  Fatigue due to treatment  Bilateral low back pain without sciatica  Midline low back pain with left-sided sciatica  Right-sided low back pain with right-sided sciatica      Subjective Assessment - 07/11/14 0807    Subjective No reports of pain today, just very tired.   Currently in Pain? No/denies             [facility] Adult PT Treatment/Exercise - 07/11/14 0001    Exercises   Exercises Knee/Hip   Lumbar Exercises: Aerobic   Stationary Bike nustep level 4 x10 minutes at end of session   Lumbar Exercises: Supine   Other Supine Lumbar Exercises PROM for flexion, abduction, IR and ER Rt LE   Knee/Hip Exercises: Machines for Strengthening   Cybex Knee Extension 2.0  pl x 20   Cybex Knee Flexion 3.5pl x 20   Cybex Leg Press 4 Pl x 20   Hip Cybex chest press 1 Pl x 20    Other Machine Row 2 PL x20             PT Short Term Goals - 07/11/14 0809    PT SHORT TERM GOAL #1  Title Pt to be I in HEP   Status Achieved   PT SHORT TERM GOAL #2   Title Pt to have stated that she has started a walking program at home   Status Achieved   PT SHORT TERM GOAL #3   Title Patient will demonstrate the efficiency of her car transfers, will be able to perform with independence and no fatigue, no difficulty with transfer    Status On-going   PT SHORT TERM GOAL #4   Title Patient will demonstrate the ability to pick an object up from the floor with more ease secondary to have gained at least 15 degrees lumbar flexion    Status On-going   PT SHORT TERM GOAL #5   Title Patient will demonstrate the ability to ambulate at least 1459ft during 6 minute walk and will be able to tolerate at least 15 minutes of exericse on Nustep secondary to improved functional activity  tolerance    Status On-going           PT Long Term Goals - 07/11/14 0813    PT LONG TERM GOAL #1   Title I in advance HEP   PT LONG TERM GOAL #2   Title Pt to be walking for at lease 15 minutes a day 3x a week   PT LONG TERM GOAL #3   Title Pt to be able to come sit to stand without use of UE assist, good eccentric contorl with descent    PT LONG TERM GOAL #4   Title Pt to be able to SLS for 10 seconds for reduce risk of falling   PT LONG TERM GOAL #5   Title Pt TUG to be 12 or less to demonstrate decreased risk of falling    Status Achieved   PT LONG TERM GOAL #6   Title FACIT-F to have improved by 25 points   Status On-going               Plan - 07/11/14 0813    Clinical Impression Statement Session focus on strengthening and stretches to improve overall mobility as well as education of exercises to complete at home and [employer] to encourage increased independence with exercises at home.  Pt continues to get fatigued through session requiring rest breaks as needed.  Noted Rt LE continues to have restrictions especialy IR/Er, manual PROM complete to improve mobility.  Pt does report increase ease with sit to stand and getting out of vehicle through slow.  No reports of increased pain through session.   PT Next Visit Plan Continue with both stretching and strengthening with emphasis on completing with HEP.  Re-evaluate next session.        Problem List Patient Active Problem List   Diagnosis Date Noted  . Genetic testing 04/29/2014  . Family history of ovarian cancer   . Family history of colon cancer   . Family history of pancreatic cancer   . Breast cancer 11/04/2013  . Low back pain 11/12/2012   Ihor Austin, [place]; [place]  Aldona Lento 07/11/2014, 9:00 AM  [place], [place], [address] Phone: [phone]   Fax:  [phone]

## 2014-07-14 ENCOUNTER — Ambulatory Visit (HOSPITAL_COMMUNITY): Payer: Medicare Other | Admitting: Physical Therapy

## 2014-07-14 DIAGNOSIS — M5442 Lumbago with sciatica, left side: Secondary | ICD-10-CM

## 2014-07-14 DIAGNOSIS — M25659 Stiffness of unspecified hip, not elsewhere classified: Secondary | ICD-10-CM | POA: Diagnosis not present

## 2014-07-14 DIAGNOSIS — R5383 Other fatigue: Secondary | ICD-10-CM

## 2014-07-14 DIAGNOSIS — M5441 Lumbago with sciatica, right side: Secondary | ICD-10-CM

## 2014-07-14 DIAGNOSIS — M545 Low back pain, unspecified: Secondary | ICD-10-CM

## 2014-07-14 NOTE — Therapy (Signed)
[place], [place], [address] Phone: [phone]   Fax:  [phone]  Physical Therapy Treatment  Patient Details  Name: Tiffany Sweeney MRN: [id number] Date of Birth: [birth year] Referring Provider:  Patrici Ranks, MD  Encounter Date: 07/14/2014      PT End of Session - 07/14/14 1220    Visit Number 30   Number of Visits 39   Date for PT Re-Evaluation 08/04/14   Authorization Type medicare    Authorization Time Period Gcode complete 20th session    Authorization - Visit Number 30   Authorization - Number of Visits 26   PT Start Time 0803   PT Stop Time 0903   PT Time Calculation (min) 60 min   Activity Tolerance Patient tolerated treatment well      Past Medical History  Diagnosis Date  . High cholesterol   . Hypertension   . Anxiety   . Cancer     breast- right  . Arthritis   . Breast cancer   . Family history of ovarian cancer   . Family history of colon cancer   . Family history of pancreatic cancer     Past Surgical History  Procedure Laterality Date  . Abdominal hysterectomy    . Mastectomy Right   . Cataract extraction w/phaco Left 03/11/2013    Procedure: CATARACT EXTRACTION PHACO AND INTRAOCULAR LENS PLACEMENT (IOC);  Surgeon: Tonny Branch, MD;  [facility]: [facility];  Service: Ophthalmology;  Laterality: Left;  CDE:7.54  . Cataract extraction w/phaco Right 04/15/2013    Procedure: CATARACT EXTRACTION PHACO AND INTRAOCULAR LENS PLACEMENT (IOC);  Surgeon: Tonny Branch, MD;  Location: [facility];  Service: Ophthalmology;  Laterality: Right;  CDE 9.38  . Mastectomy modified radical Left 11/04/2013    Procedure: LEFT MASTECTOMY MODIFIED RADICAL;  Surgeon: Jamesetta So, MD;  Location: [facility];  Service: General;  Laterality: Left;  . Portacath placement Right 12/06/2013    Procedure: INSERTION PORT-A-CATH-Right subclavian;  Surgeon: Jamesetta So, MD;  Location: [facility];  Service: General;  Laterality:  Right;    There were no vitals filed for this visit.  Visit Diagnosis:  Stiffness of hip joint, unspecified laterality  Midline low back pain without sciatica  Fatigue due to treatment  Bilateral low back pain without sciatica  Midline low back pain with left-sided sciatica  Right-sided low back pain with right-sided sciatica      Subjective Assessment - 07/14/14 0807    Subjective Tiffany Sweeney states that she is feeling better.    Pertinent History Breast cancer; Low back pain   How long can you sit comfortably? 6/8- no limits    How long can you stand comfortably? 6/8- no limits    How long can you walk comfortably? 7/11- able to walk for 45 minutes    Patient Stated Goals increased energy    Pain Score 2    Pain Location Back   Pain Orientation Lower;Left;Right   Pain Descriptors / Indicators Aching;Tightness            [facility] PT Assessment - 07/14/14 0001    Assessment   Medical Diagnosis fatigue/difficulty walking   Onset Date/Surgical Date 12/03/13   Next MD Visit [place] 07/01/2014   Prior Therapy none   [place] residence   Type of [place] to enter   Entrance [place] of [place] or  work area in basement   Prior Function   Vocation Retired   Leisure reading, cooking, gardening    Observation/Other Assessments   Observations --  VAS distress was 3 now 1    Other Surveys  Other Surveys  FACIT-F120 was 111; VASfatigut 6.3 was 6.3; pain 4.6 was 4.6   Functional Tests   Functional tests Sit to Stand   Sit to Stand   Comments 5x sit to stand 19 seconds  was 24 seconds on 06/11/2014; unable on 04/11/2014   AROM   Right Hip External Rotation  40   Right Hip Internal Rotation  12   Left Hip External Rotation  34   Left Hip Internal Rotation  0   Lumbar Flexion 50  was 45   Lumbar Extension 15  was 10   Lumbar - Right Side Bend 23   Lumbar - Left Side Bend 22   Lumbar  - Right Rotation 42   Lumbar - Left Rotation 30   Strength   Right Hip Flexion 3/5  was 2/5    Right Hip Extension 4-/5  was 3/5   Right Hip ABduction 5/5  was 4-/5   Left Hip Flexion 5/5  was 4+/5    Left Hip Extension 4-/5  was 35   Left Hip ABduction 4-/5  was 4-/5   Right Knee Flexion 5/5   Right Knee Extension 4+/5  was 4-/5    Left Knee Flexion 4+/5   Left Knee Extension 5/5  was 4/5    Right Ankle Dorsiflexion 4/5  was 4/5    Left Ankle Dorsiflexion 4+/5  was 4+/5    6 Minute Walk- Baseline   6 Minute Walk- Baseline --  1080f in 6 minutes not retested today    Timed Up and Go Test   TUG Normal TUG   Normal TUG (seconds) 12                     [facility] Adult PT Treatment/Exercise - 07/14/14 0001    Lumbar Exercises: Stretches   Prone on Elbows Stretch 1 rep;60 seconds   Lumbar Exercises: Supine   Other Supine Lumbar Exercises active IR on Lt to neutral hold 10 seconds x 10    Lumbar Exercises: Prone   Straight Leg Raise 10 reps   Knee/Hip Exercises: Aerobic   Stationary Bike nustep L4 x 15 "   Knee/Hip Exercises: Machines for Strengthening   Cybex Knee Extension 2.5pl x 15   Cybex Knee Flexion 4.0 pl x 15                PT Education - 07/14/14 1225    Education provided Yes   Education Details Working on Supine IR of Rt LE at home    [employer] Educated Patient   Methods Explanation   Comprehension Verbalized understanding;Returned demonstration          PT Short Term Goals - 07/14/14 1226    PT SHORT TERM GOAL #1   Title Pt to be I in HEP   Status Achieved   PT SHORT TERM GOAL #2   Title Pt to have stated that she has started a walking program at home   Status On-going   PT [place]#3   Title Patient will demonstrate the efficiency of her car transfers, will be able to perform with independence and no fatigue, no difficulty with transfer    Baseline 7/11- mod I with these activities.  Pt  is driving herself to  appointments now.   Status Partially Met   PT SHORT TERM GOAL #4   Title Patient will demonstrate the ability to pick an object up from the floor with more ease secondary to have gained at least 15 degrees lumbar flexion    Baseline can pick up an object of the floor with greater ease she has not gained 15 degrees in her lumbar flexion    Time 4   Period Weeks   Status Partially Met   PT SHORT TERM GOAL #5   Title Patient will demonstrate the ability to ambulate at least 1431f during 6 minute walk and will be able to tolerate at least 15 minutes of exericse on Nustep secondary to improved functional activity tolerance    Baseline on nustep on hills level 4 x 15 minutes 7August 10, 2016   Period Weeks   Status Achieved           PT Long Term Goals - 008-10-20161230    PT LONG TERM GOAL #1   Title I in advance HEP   Time 4   Period Weeks   Status Achieved   PT LONG TERM GOAL #2   Title Pt to be walking for at lease 15 minutes a day 3x a week   Baseline pt is not walking on a regular basis despite encouragement.  States that there is not a good place to walk.   Time 4   Period Weeks   Status On-going   PT LONG TERM GOAL #3   Title Pt to be able to come sit to stand without use of UE assist, good eccentric contorl with descent    Baseline able to come sit to stnd without her UE assist with effort but does not demonstrate good eccentric control with descent    Status Partially Met   PT LONG TERM GOAL #4   Title Pt to be able to SLS for 10 seconds for reduce risk of falling   Time 4   Status Achieved   PT LONG TERM GOAL #5   Title Pt TUG to be 12 or less to demonstrate decreased risk of falling    Baseline 5/13- at best 11.11 seconds    Status Achieved   PT LONG TERM GOAL #6   Title FACIT-F to have improved by 25 points   Baseline 5/13- FACIT-F total score 111 now 120    Status On-going               Plan - 008-10-161222    Clinical Impression Statement Pt reassessed this  treatment with gains shown in all mm strength and FACIT- F increasing significantly.   Pt feels she is continuing to improve from the program and requests to continue and therapist agrees. Pt still has decreased strength, balance and ROM which we will continue to focuse on.   Pt will benefit from skilled therapeutic intervention in order to improve on the following deficits Decreased activity tolerance;Decreased balance;Decreased endurance;Decreased strength;Difficulty walking;Decreased range of motion   Rehab Potential Good   PT Frequency 3x / week   PT Duration 3 weeks   PT Treatment/Interventions Therapeutic activities;Therapeutic exercise;Balance training;Functional mobility training;Patient/family education;Manual techniques   PT Next Visit Plan Encourage pt to join a fitness center.  Continue working on balance, strength and ROM           G-Codes - 0Aug 10, 2016103-25-32   Functional Assessment Tool Used skilled clinical assessment based on mobility, joint  stiffness, balance, functional endurance, functional mobility and FACIT-F   Functional Limitation Mobility: Walking and moving around   Mobility: Walking and Moving Around Current Status [phone]) At least 20 percent but less than 40 percent impaired, limited or restricted   Mobility: Walking and Moving Around Goal Status [phone]) At least 20 percent but less than 40 percent impaired, limited or restricted      Problem List Patient Active Problem List   Diagnosis Date Noted  . Genetic testing 04/29/2014  . Family history of ovarian cancer   . Family history of colon cancer   . Family history of pancreatic cancer   . Breast cancer 11/04/2013  . Low back pain 11/12/2012   Rayetta Humphrey, PT CLT [phone] 07/14/2014, 12:34 PM  [place] [address] [place], [place], [address] Phone: [phone]   Fax:  [phone]

## 2014-07-16 ENCOUNTER — Ambulatory Visit (HOSPITAL_COMMUNITY): Payer: Medicare Other | Admitting: Physical Therapy

## 2014-07-16 DIAGNOSIS — M25659 Stiffness of unspecified hip, not elsewhere classified: Secondary | ICD-10-CM

## 2014-07-16 DIAGNOSIS — R5383 Other fatigue: Secondary | ICD-10-CM

## 2014-07-16 DIAGNOSIS — M545 Low back pain, unspecified: Secondary | ICD-10-CM

## 2014-07-16 DIAGNOSIS — M5441 Lumbago with sciatica, right side: Secondary | ICD-10-CM

## 2014-07-16 NOTE — Therapy (Signed)
[facility] [place], [place], [address] Phone: [phone]   Fax:  [phone]  Physical Therapy Treatment  Patient Details  Name: Tiffany Sweeney MRN: [id number] Date of Birth: 1934/12/25 Referring Provider:  Patrici Ranks, MD  Encounter Date: 07/16/2014      PT End of Session - 07/16/14 0851    Visit Number 31   Number of Visits 40   Date for PT Re-Evaluation 08/04/14   Authorization Type medicare    Authorization Time Period g code completed 30th   Authorization - Visit Number 31   Authorization - Number of Visits 68   PT Start Time 0803   PT Stop Time 0910   PT Time Calculation (min) 67 min   Activity Tolerance Patient tolerated treatment well      Past Medical History  Diagnosis Date  . High cholesterol   . Hypertension   . Anxiety   . Cancer     breast- right  . Arthritis   . Breast cancer   . Family history of ovarian cancer   . Family history of colon cancer   . Family history of pancreatic cancer     Past Surgical History  Procedure Laterality Date  . Abdominal hysterectomy    . Mastectomy Right   . Cataract extraction w/phaco Left 03/11/2013    Procedure: CATARACT EXTRACTION PHACO AND INTRAOCULAR LENS PLACEMENT (IOC);  Surgeon: Tonny Branch, MD;  Location: [facility];  Service: Ophthalmology;  Laterality: Left;  CDE:7.54  . Cataract extraction w/phaco Right 04/15/2013    Procedure: CATARACT EXTRACTION PHACO AND INTRAOCULAR LENS PLACEMENT (IOC);  Surgeon: Tonny Branch, MD;  Location: [facility];  Service: Ophthalmology;  Laterality: Right;  CDE 9.38  . Mastectomy modified radical Left 11/04/2013    Procedure: LEFT MASTECTOMY MODIFIED RADICAL;  Surgeon: Jamesetta So, MD;  Location: [facility];  Service: General;  Laterality: Left;  . Portacath placement Right 12/06/2013    Procedure: INSERTION PORT-A-CATH-Right subclavian;  Surgeon: Jamesetta So, MD;  Location: [facility];  Service: General;  Laterality: Right;     There were no vitals filed for this visit.  Visit Diagnosis:  Stiffness of hip joint, unspecified laterality  Midline low back pain without sciatica  Fatigue due to treatment  Bilateral low back pain without sciatica  Right-sided low back pain with right-sided sciatica      Subjective Assessment - 07/16/14 0812    Subjective Pt has no complaints continues to do her stretching at home.   Currently in Pain? No/denies                [facility] Adult PT Treatment/Exercise - 07/16/14 0001    Lumbar Exercises: Standing   Forward Lunge --  3 x 30" on 12' step    Lumbar Exercises: Supine   Other Supine Lumbar Exercises PROM for flexion, abduction, IR and ER Rt LE   Knee/Hip Exercises: Stretches   Gastroc Stretch Both;1 rep;60 seconds   Gastroc Stretch Limitations slant board    Knee/Hip Exercises: Aerobic   Stationary Bike nustep L4 x 15 "   Knee/Hip Exercises: Machines for Strengthening   Cybex Knee Extension 2.5pl x 15   Cybex Knee Flexion 4.0 pl x 15   Cybex Leg Press 5 pl x 15   Hip Cybex chest press 1 Pl x 20    Other Machine Row 2 PL x20   Knee/Hip Exercises: Standing   Heel Raises 5 reps combined with functional squat  with yellow ball on 8" step    Rocker Board 2 minutes   Other Standing Knee Exercises Adductor stretch x5                PT Education - 07/16/14 0848    Education provided Yes   Education Details again supine IR pt had forgotten we had talked about this.   Person(s) Educated Patient   Methods Explanation   Comprehension Returned demonstration;Verbalized understanding          PT Short Term Goals - 07/14/14 1226    PT SHORT TERM GOAL #1   Title Pt to be I in HEP   Status Achieved   PT SHORT TERM GOAL #2   Title Pt to have stated that she has started a walking program at home   Status On-going   PT [place] #3   Title Patient will demonstrate the efficiency of her car transfers, will be able to perform with independence  and no fatigue, no difficulty with transfer    Baseline 7/11- mod I with these activities.  Pt is driving herself to appointments now.   Status Partially Met   PT SHORT TERM GOAL #4   Title Patient will demonstrate the ability to pick an object up from the floor with more ease secondary to have gained at least 15 degrees lumbar flexion    Baseline can pick up an object of the floor with greater ease she has not gained 15 degrees in her lumbar flexion    Time 4   Period Weeks   Status Partially Met   PT SHORT TERM GOAL #5   Title Patient will demonstrate the ability to ambulate at least 1415f during 6 minute walk and will be able to tolerate at least 15 minutes of exericse on Nustep secondary to improved functional activity tolerance    Baseline on nustep on hills level 4 x 15 minutes 07/14/2014    Period Weeks   Status Achieved           PT Long Term Goals - 07/14/14 1230    PT LONG TERM GOAL #1   Title I in advance HEP   Time 4   Period Weeks   Status Achieved   PT LONG TERM GOAL #2   Title Pt to be walking for at lease 15 minutes a day 3x a week   Baseline pt is not walking on a regular basis despite encouragement.  States that there is not a good place to walk.   Time 4   Period Weeks   Status On-going   PT LONG TERM GOAL #3   Title Pt to be able to come sit to stand without use of UE assist, good eccentric contorl with descent    Baseline able to come sit to stnd without her UE assist with effort but does not demonstrate good eccentric control with descent    Status Partially Met   PT LONG TERM GOAL #4   Title Pt to be able to SLS for 10 seconds for reduce risk of falling   Time 4   Status Achieved   PT LONG TERM GOAL #5   Title Pt TUG to be 12 or less to demonstrate decreased risk of falling    Baseline 5/13- at best 11.11 seconds    Status Achieved   PT LONG TERM GOAL #6   Title FACIT-F to have improved by 25 points   Baseline 5/13- FACIT-F total score 111 now  [place] - 07/16/14 1421    Clinical Impression Statement Began balance and more functional activity to increase strength and ROM today.  Pt continues to stretch I at home.  Pt needs therapist facilitation to complete exercises without substitutional patterns.    PT Next Visit Plan begin back extension cybex machine for core strengthening.   Continue to encourage pt to join a fitness center.         Problem List Patient Active Problem List   Diagnosis Date Noted  . Genetic testing 04/29/2014  . Family history of ovarian cancer   . Family history of colon cancer   . Family history of pancreatic cancer   . Breast cancer 11/04/2013  . Low back pain 11/12/2012    Rayetta Humphrey, PT CLT [phone] 07/16/2014, 2:24 PM  [facility] [address] [place], [place], [address] Phone: [phone]   Fax:  [phone]

## 2014-07-18 ENCOUNTER — Ambulatory Visit (HOSPITAL_COMMUNITY): Payer: Medicare Other | Admitting: Physical Therapy

## 2014-07-18 DIAGNOSIS — M5441 Lumbago with sciatica, right side: Secondary | ICD-10-CM

## 2014-07-18 DIAGNOSIS — M545 Low back pain, unspecified: Secondary | ICD-10-CM

## 2014-07-18 DIAGNOSIS — M25659 Stiffness of unspecified hip, not elsewhere classified: Secondary | ICD-10-CM | POA: Diagnosis not present

## 2014-07-18 DIAGNOSIS — R5383 Other fatigue: Secondary | ICD-10-CM

## 2014-07-18 NOTE — Therapy (Signed)
[place], [place], [address] Phone: [phone]   Fax:  [phone]  Physical Therapy Treatment  Patient Details  Name: Tiffany Sweeney MRN: [id number] Date of Birth: Feb 04, 1934 Referring Provider:  Patrici Ranks, MD  Encounter Date: 07/18/2014      PT End of Session - 07/18/14 1118    Visit Number 32   Number of Visits 40   Date for PT Re-Evaluation 08/04/14   Authorization Type medicare    Authorization Time Period g code completed 30th   Authorization - Visit Number 27   Authorization - Number of Visits 40   PT Start Time 4920   PT Stop Time 1155   PT Time Calculation (min) 100 min      Past Medical History  Diagnosis Date  . High cholesterol   . Hypertension   . Anxiety   . Cancer     breast- right  . Arthritis   . Breast cancer   . Family history of ovarian cancer   . Family history of colon cancer   . Family history of pancreatic cancer     Past Surgical History  Procedure Laterality Date  . Abdominal hysterectomy    . Mastectomy Right   . Cataract extraction w/phaco Left 03/11/2013    Procedure: CATARACT EXTRACTION PHACO AND INTRAOCULAR LENS PLACEMENT (IOC);  Surgeon: Tonny Branch, MD;  Location: [facility];  Service: Ophthalmology;  Laterality: Left;  CDE:7.54  . Cataract extraction w/phaco Right 04/15/2013    Procedure: CATARACT EXTRACTION PHACO AND INTRAOCULAR LENS PLACEMENT (IOC);  Surgeon: Tonny Branch, MD;  Location: [facility];  Service: Ophthalmology;  Laterality: Right;  CDE 9.38  . Mastectomy modified radical Left 11/04/2013    Procedure: LEFT MASTECTOMY MODIFIED RADICAL;  Surgeon: Jamesetta So, MD;  Location: [facility];  Service: General;  Laterality: Left;  . Portacath placement Right 12/06/2013    Procedure: INSERTION PORT-A-CATH-Right subclavian;  Surgeon: Jamesetta So, MD;  Location: [facility];  Service: General;  Laterality: Right;    There were no vitals filed for this  visit.  Visit Diagnosis:  Stiffness of hip joint, unspecified laterality  Midline low back pain without sciatica  Fatigue due to treatment  Bilateral low back pain without sciatica  Right-sided low back pain with right-sided sciatica      Subjective Assessment - 07/18/14 0955    Subjective Pt has no complaints continues to do her stretching at home.   Pertinent History Breast cancer; Low back pain   Currently in Pain? No/denies                         [facility] Adult PT Treatment/Exercise - 07/18/14 0001    Lumbar Exercises: Stretches   Quad Stretch --  4' stretch Rt LE off the bed.    Lumbar Exercises: Aerobic   Stationary Bike nustep level 4 x10 minutes at end of session   Lumbar Exercises: Supine   Other Supine Lumbar Exercises active IR on Lt to neutral hold 10 seconds x 10    Knee/Hip Exercises: Stretches   Gastroc Stretch Both;1 rep;60 seconds   Gastroc Stretch Limitations slant board    Knee/Hip Exercises: Aerobic   Stationary Bike nustep L4 x 15 "   Knee/Hip Exercises: Machines for Strengthening   Cybex Knee Extension 2.5pl x 15   Cybex Knee Flexion 4.0 pl x 15   Cybex Leg Press 5 pl x 20  Total Gym Leg Press --  back extension  Level 4 x 10    Hip Cybex chest press 1 Pl x 10    Other Machine Row 2 PL x10   Knee/Hip Exercises: Standing   Heel Raises 10 reps  combined with functional squat placing yellow ball on 8" ste   Other Standing Knee Exercises tandem stance x one minute B                   PT Short Term Goals - 07/14/14 1226    PT SHORT TERM GOAL #1   Title Pt to be I in HEP   Status Achieved   PT SHORT TERM GOAL #2   Title Pt to have stated that she has started a walking program at home   Status On-going   PT [place] #3   Title Patient will demonstrate the efficiency of her car transfers, will be able to perform with independence and no fatigue, no difficulty with transfer    Baseline 7/11- mod I with these  activities.  Pt is driving herself to appointments now.   Status Partially Met   PT SHORT TERM GOAL #4   Title Patient will demonstrate the ability to pick an object up from the floor with more ease secondary to have gained at least 15 degrees lumbar flexion    Baseline can pick up an object of the floor with greater ease she has not gained 15 degrees in her lumbar flexion    Time 4   Period Weeks   Status Partially Met   PT SHORT TERM GOAL #5   Title Patient will demonstrate the ability to ambulate at least 1470f during 6 minute walk and will be able to tolerate at least 15 minutes of exericse on Nustep secondary to improved functional activity tolerance    Baseline on nustep on hills level 4 x 15 minutes 07/14/2014    Period Weeks   Status Achieved           PT Long Term Goals - 07/14/14 1230    PT LONG TERM GOAL #1   Title I in advance HEP   Time 4   Period Weeks   Status Achieved   PT LONG TERM GOAL #2   Title Pt to be walking for at lease 15 minutes a day 3x a week   Baseline pt is not walking on a regular basis despite encouragement.  States that there is not a good place to walk.   Time 4   Period Weeks   Status On-going   PT LONG TERM GOAL #3   Title Pt to be able to come sit to stand without use of UE assist, good eccentric contorl with descent    Baseline able to come sit to stnd without her UE assist with effort but does not demonstrate good eccentric control with descent    Status Partially Met   PT LONG TERM GOAL #4   Title Pt to be able to SLS for 10 seconds for reduce risk of falling   Time 4   Status Achieved   PT LONG TERM GOAL #5   Title Pt TUG to be 12 or less to demonstrate decreased risk of falling    Baseline 5/13- at best 11.11 seconds    Status Achieved   PT LONG TERM GOAL #6   Title FACIT-F to have improved by 25 points   Baseline 5/13- FACIT-F total score 111 now 120  Status On-going               Plan - 07/18/14 1516    Clinical  Impression Statement Added balance and back extension machine to program with no difficulty.     PT Next Visit Plan continue to encourage pt to join a fitness center        Problem List Patient Active Problem List   Diagnosis Date Noted  . Genetic testing 04/29/2014  . Family history of ovarian cancer   . Family history of colon cancer   . Family history of pancreatic cancer   . Breast cancer 11/04/2013  . Low back pain 11/12/2012   Rayetta Humphrey, PT CLT [phone] 07/18/2014, 3:19 PM  [facility] [address] [place], [place], [address] Phone: [phone]   Fax:  [phone]

## 2014-07-29 ENCOUNTER — Ambulatory Visit (HOSPITAL_COMMUNITY): Payer: Medicare Other | Admitting: Physical Therapy

## 2014-07-29 DIAGNOSIS — M545 Low back pain, unspecified: Secondary | ICD-10-CM

## 2014-07-29 DIAGNOSIS — R5383 Other fatigue: Secondary | ICD-10-CM

## 2014-07-29 DIAGNOSIS — M25659 Stiffness of unspecified hip, not elsewhere classified: Secondary | ICD-10-CM | POA: Diagnosis not present

## 2014-07-29 NOTE — Therapy (Signed)
[place], [place], [address] Phone: [phone]   Fax:  [phone]  Physical Therapy Treatment  Patient Details  Name: Tiffany Sweeney MRN: [id number] Date of Birth: July 09, 1934 Referring Provider:  Patrici Ranks, MD  Encounter Date: 07/29/2014      PT End of Session - 07/29/14 1054    Visit Number 33   Number of Visits 40   Date for PT Re-Evaluation 08/04/14   Authorization - Visit Number 67   Authorization - Number of Visits 40   PT Start Time 0937   PT Stop Time 1026   PT Time Calculation (min) 49 min   Equipment Utilized During Treatment Gait belt      Past Medical History  Diagnosis Date  . High cholesterol   . Hypertension   . Anxiety   . Cancer     breast- right  . Arthritis   . Breast cancer   . Family history of ovarian cancer   . Family history of colon cancer   . Family history of pancreatic cancer     Past Surgical History  Procedure Laterality Date  . Abdominal hysterectomy    . Mastectomy Right   . Cataract extraction w/phaco Left 03/11/2013    Procedure: CATARACT EXTRACTION PHACO AND INTRAOCULAR LENS PLACEMENT (IOC);  Surgeon: Tonny Branch, MD;  Location: [facility];  Service: Ophthalmology;  Laterality: Left;  CDE:7.54  . Cataract extraction w/phaco Right 04/15/2013    Procedure: CATARACT EXTRACTION PHACO AND INTRAOCULAR LENS PLACEMENT (IOC);  Surgeon: Tonny Branch, MD;  Location: [facility];  Service: Ophthalmology;  Laterality: Right;  CDE 9.38  . Mastectomy modified radical Left 11/04/2013    Procedure: LEFT MASTECTOMY MODIFIED RADICAL;  Surgeon: Jamesetta So, MD;  Location: [facility];  Service: General;  Laterality: Left;  . Portacath placement Right 12/06/2013    Procedure: INSERTION PORT-A-CATH-Right subclavian;  Surgeon: Jamesetta So, MD;  Location: [facility];  Service: General;  Laterality: Right;    There were no vitals filed for this visit.  Visit Diagnosis:  Stiffness of hip  joint, unspecified laterality  Midline low back pain without sciatica  Fatigue due to treatment  Bilateral low back pain without sciatica      Subjective Assessment - 07/29/14 1006    Subjective Pt states she has no complaints.  She has been in [place] with family having to do steps which irritated her back    Currently in Pain? Yes   Pain Score 2    Pain Location Back   Pain Orientation Lower                         [facility] Adult PT Treatment/Exercise - 07/29/14 0001    Balance Poses: Yoga   Warrior II 2 reps;60 seconds   Lumbar Exercises: Public affairs consultant --  4' stretch Rt LE off the bed.    Lumbar Exercises: Aerobic   Stationary Bike nustep level 4 x10 minutes at end of session   Lumbar Exercises: Machines for Strengthening   Cybex Lumbar Extension 4.5 x 15    Lumbar Exercises: Standing   Heel Raises 15 reps   Heel Raises Limitations with functional squat with yellow ball    Functional Squats 15 reps   Lumbar Exercises: Supine   Other Supine Lumbar Exercises active IR on Lt to neutral hold 10 seconds x 10    Knee/Hip Exercises: Aerobic   Stationary  Bike nustep L4 x 15 "   Knee/Hip Exercises: Machines for Strengthening   Cybex Knee Extension 2.5pl x 15   Cybex Knee Flexion 4.0 pl x 15   Cybex Leg Press 6 pl x 10   Total Gym Leg Press --  back extension  Level 4 x 10    Hip Cybex chest press 1 Pl x 10                   PT Short Term Goals - 07/14/14 1226    PT SHORT TERM GOAL #1   Title Pt to be I in HEP   Status Achieved   PT SHORT TERM GOAL #2   Title Pt to have stated that she has started a walking program at home   Status On-going   PT [place] #3   Title Patient will demonstrate the efficiency of her car transfers, will be able to perform with independence and no fatigue, no difficulty with transfer    Baseline 7/11- mod I with these activities.  Pt is driving herself to appointments now.   Status Partially Met    PT SHORT TERM GOAL #4   Title Patient will demonstrate the ability to pick an object up from the floor with more ease secondary to have gained at least 15 degrees lumbar flexion    Baseline can pick up an object of the floor with greater ease she has not gained 15 degrees in her lumbar flexion    Time 4   Period Weeks   Status Partially Met   PT SHORT TERM GOAL #5   Title Patient will demonstrate the ability to ambulate at least 1485f during 6 minute walk and will be able to tolerate at least 15 minutes of exericse on Nustep secondary to improved functional activity tolerance    Baseline on nustep on hills level 4 x 15 minutes 07/14/2014    Period Weeks   Status Achieved           PT Long Term Goals - 07/14/14 1230    PT LONG TERM GOAL #1   Title I in advance HEP   Time 4   Period Weeks   Status Achieved   PT LONG TERM GOAL #2   Title Pt to be walking for at lease 15 minutes a day 3x a week   Baseline pt is not walking on a regular basis despite encouragement.  States that there is not a good place to walk.   Time 4   Period Weeks   Status On-going   PT LONG TERM GOAL #3   Title Pt to be able to come sit to stand without use of UE assist, good eccentric contorl with descent    Baseline able to come sit to stnd without her UE assist with effort but does not demonstrate good eccentric control with descent    Status Partially Met   PT LONG TERM GOAL #4   Title Pt to be able to SLS for 10 seconds for reduce risk of falling   Time 4   Status Achieved   PT LONG TERM GOAL #5   Title Pt TUG to be 12 or less to demonstrate decreased risk of falling    Baseline 5/13- at best 11.11 seconds    Status Achieved   PT LONG TERM GOAL #6   Title FACIT-F to have improved by 25 points   Baseline 5/13- FACIT-F total score 111 now 120  Status On-going               Plan - 07/29/14 1055    Clinical Impression Statement Added Warrior II pose for core strengthening and balance.  Pt  continue to needs multimodal cuing for proper technique of exercises.  Continued to encourage goinging a f[facility]ness center but pt is resistant.    PT Next Visit Plan continue to emphasis balance and strength.         Problem List Patient Active Problem List   Diagnosis Date Noted  . Genetic testing 04/29/2014  . Family history of ovarian cancer   . Family history of colon cancer   . Family history of pancreatic cancer   . Breast cancer 11/04/2013  . Low back pain 11/12/2012    Rayetta Humphrey, PT CLT [phone] 07/29/2014, 10:59 AM  [facility] [place], [place], [address] Phone: [phone]   Fax:  [phone]

## 2014-07-31 ENCOUNTER — Ambulatory Visit (HOSPITAL_COMMUNITY): Payer: Medicare Other | Admitting: Physical Therapy

## 2014-07-31 ENCOUNTER — Ambulatory Visit (HOSPITAL_COMMUNITY): Payer: Medicare Other

## 2014-07-31 DIAGNOSIS — M545 Low back pain, unspecified: Secondary | ICD-10-CM

## 2014-07-31 DIAGNOSIS — M5441 Lumbago with sciatica, right side: Secondary | ICD-10-CM

## 2014-07-31 DIAGNOSIS — M25659 Stiffness of unspecified hip, not elsewhere classified: Secondary | ICD-10-CM

## 2014-07-31 DIAGNOSIS — R5383 Other fatigue: Secondary | ICD-10-CM

## 2014-07-31 DIAGNOSIS — M5442 Lumbago with sciatica, left side: Secondary | ICD-10-CM

## 2014-07-31 NOTE — Therapy (Signed)
[place], [place], [address] Phone: [phone]   Fax:  [phone]  Physical Therapy Treatment  Patient Details  Name: Tiffany Sweeney MRN: [id number] Date of Birth: 05/08/1934 Referring Provider:  Patrici Ranks, MD  Encounter Date: 07/31/2014      PT End of Session - 07/31/14 1253    Visit Number 34   Number of Visits 40   Date for PT Re-Evaluation 08/04/14   Authorization Type medicare    Authorization Time Period g code completed 30th   Authorization - Visit Number 34   Authorization - Number of Visits 40   PT Start Time 1025   PT Stop Time 1125   PT Time Calculation (min) 60 min   Equipment Utilized During Treatment Gait belt   Activity Tolerance Patient tolerated treatment well   Behavior During Therapy WFL for tasks assessed/performed      Past Medical History  Diagnosis Date  . High cholesterol   . Hypertension   . Anxiety   . Cancer     breast- right  . Arthritis   . Breast cancer   . Family history of ovarian cancer   . Family history of colon cancer   . Family history of pancreatic cancer     Past Surgical History  Procedure Laterality Date  . Abdominal hysterectomy    . Mastectomy Right   . Cataract extraction w/phaco Left 03/11/2013    Procedure: CATARACT EXTRACTION PHACO AND INTRAOCULAR LENS PLACEMENT (IOC);  Surgeon: Tonny Branch, MD;  Location: [facility];  Service: Ophthalmology;  Laterality: Left;  CDE:7.54  . Cataract extraction w/phaco Right 04/15/2013    Procedure: CATARACT EXTRACTION PHACO AND INTRAOCULAR LENS PLACEMENT (IOC);  Surgeon: Tonny Branch, MD;  Location: [facility];  Service: Ophthalmology;  Laterality: Right;  CDE 9.38  . Mastectomy modified radical Left 11/04/2013    Procedure: LEFT MASTECTOMY MODIFIED RADICAL;  Surgeon: Jamesetta So, MD;  Location: [facility];  Service: General;  Laterality: Left;  . Portacath placement Right 12/06/2013    Procedure: INSERTION  PORT-A-CATH-Right subclavian;  Surgeon: Jamesetta So, MD;  Location: [facility];  Service: General;  Laterality: Right;    There were no vitals filed for this visit.  Visit Diagnosis:  Stiffness of hip joint, unspecified laterality  Midline low back pain without sciatica  Fatigue due to treatment  Bilateral low back pain without sciatica  Right-sided low back pain with right-sided sciatica  Midline low back pain with left-sided sciatica      Subjective Assessment - 07/31/14 1252    Subjective Pt states she is doing well today.  Less back irritation but still limited in her groin.   Currently in Pain? No/denies                         [facility] Adult PT Treatment/Exercise - 07/31/14 1106    Balance Poses: Yoga   Warrior I 2 reps;3 reps   Lumbar Exercises: Aerobic   Stationary Bike nustep level 4 x10 minutes at end of session   Lumbar Exercises: Machines for Strengthening   Cybex Lumbar Extension 2.5pl x 15    Cybex Knee Extension 2.5pl X 15   Cybex Knee Flexion 4pl x 15   Leg Press 6pl X 15   Other Lumbar Machine Exercise rows 1pl X 15   Lumbar Exercises: Standing   Heel Raises 15 reps   Heel Raises Limitations with functional squat with  yellow ball                   PT Short Term Goals - 07/14/14 1226    PT SHORT TERM GOAL #1   Title Pt to be I in HEP   Status Achieved   PT SHORT TERM GOAL #2   Title Pt to have stated that she has started a walking program at home   Status On-going   PT [place] #3   Title Patient will demonstrate the efficiency of her car transfers, will be able to perform with independence and no fatigue, no difficulty with transfer    Baseline 7/11- mod I with these activities.  Pt is driving herself to appointments now.   Status Partially Met   PT SHORT TERM GOAL #4   Title Patient will demonstrate the ability to pick an object up from the floor with more ease secondary to have gained at least 15 degrees lumbar  flexion    Baseline can pick up an object of the floor with greater ease she has not gained 15 degrees in her lumbar flexion    Time 4   Period Weeks   Status Partially Met   PT SHORT TERM GOAL #5   Title Patient will demonstrate the ability to ambulate at least 149f during 6 minute walk and will be able to tolerate at least 15 minutes of exericse on Nustep secondary to improved functional activity tolerance    Baseline on nustep on hills level 4 x 15 minutes 07/14/2014    Period Weeks   Status Achieved           PT Long Term Goals - 07/14/14 1230    PT LONG TERM GOAL #1   Title I in advance HEP   Time 4   Period Weeks   Status Achieved   PT LONG TERM GOAL #2   Title Pt to be walking for at lease 15 minutes a day 3x a week   Baseline pt is not walking on a regular basis despite encouragement.  States that there is not a good place to walk.   Time 4   Period Weeks   Status On-going   PT LONG TERM GOAL #3   Title Pt to be able to come sit to stand without use of UE assist, good eccentric contorl with descent    Baseline able to come sit to stnd without her UE assist with effort but does not demonstrate good eccentric control with descent    Status Partially Met   PT LONG TERM GOAL #4   Title Pt to be able to SLS for 10 seconds for reduce risk of falling   Time 4   Status Achieved   PT LONG TERM GOAL #5   Title Pt TUG to be 12 or less to demonstrate decreased risk of falling    Baseline 5/13- at best 11.11 seconds    Status Achieved   PT LONG TERM GOAL #6   Title FACIT-F to have improved by 25 points   Baseline 5/13- FACIT-F total score 111 now 120    Status On-going               Plan - 07/31/14 1248    Clinical Impression Statement Noted improvement with mobiltiy through ability to get on/off cybex strengthening machines.  Added warrior I yoga pose to increase stability.  Pt only able to maintain 30" before having to rest.  Completed 15 minutes on  nustep at end  of session.     PT Next Visit Plan continue to emphasis balance and strength. PRogress with Warrior II pose and additional dynamic stability tasks as able.         Problem List Patient Active Problem List   Diagnosis Date Noted  . Genetic testing 04/29/2014  . Family history of ovarian cancer   . Family history of colon cancer   . Family history of pancreatic cancer   . Breast cancer 11/04/2013  . Low back pain 11/12/2012    Teena Irani, PTA/CLT [phone]  07/31/2014, 12:54 PM  [place] [address] [place], [place], [address] Phone: [phone]   Fax:  [phone]

## 2014-08-05 ENCOUNTER — Ambulatory Visit (HOSPITAL_COMMUNITY): Payer: Medicare Other | Attending: Hematology & Oncology | Admitting: Physical Therapy

## 2014-08-05 ENCOUNTER — Ambulatory Visit (HOSPITAL_COMMUNITY): Payer: Medicare Other | Admitting: Physical Therapy

## 2014-08-05 DIAGNOSIS — M25659 Stiffness of unspecified hip, not elsewhere classified: Secondary | ICD-10-CM | POA: Diagnosis not present

## 2014-08-05 DIAGNOSIS — M5442 Lumbago with sciatica, left side: Secondary | ICD-10-CM | POA: Insufficient documentation

## 2014-08-05 DIAGNOSIS — M545 Low back pain, unspecified: Secondary | ICD-10-CM

## 2014-08-05 DIAGNOSIS — R5383 Other fatigue: Secondary | ICD-10-CM | POA: Insufficient documentation

## 2014-08-05 DIAGNOSIS — M5441 Lumbago with sciatica, right side: Secondary | ICD-10-CM | POA: Diagnosis present

## 2014-08-05 NOTE — Therapy (Signed)
[place], [place], [address] Phone: [phone]   Fax:  [phone]  Physical Therapy Treatment  Patient Details  Name: Tiffany Sweeney MRN: [id number] Date of Birth: [birth year] Referring Provider:  Patrici Ranks, MD  Encounter Date: 08/05/2014      PT End of Session - 08/05/14 0902    Visit Number 35   Number of Visits 40   Authorization Type medicare    Authorization Time Period g code completed 30th   Authorization - Visit Number 35   Authorization - Number of Visits 40   PT Start Time 0803   PT Stop Time 0905   PT Time Calculation (min) 62 min   Activity Tolerance Patient tolerated treatment well      Past Medical History  Diagnosis Date  . High cholesterol   . Hypertension   . Anxiety   . Cancer     breast- right  . Arthritis   . Breast cancer   . Family history of ovarian cancer   . Family history of colon cancer   . Family history of pancreatic cancer     Past Surgical History  Procedure Laterality Date  . Abdominal hysterectomy    . Mastectomy Right   . Cataract extraction w/phaco Left 03/11/2013    Procedure: CATARACT EXTRACTION PHACO AND INTRAOCULAR LENS PLACEMENT (IOC);  Surgeon: Tonny Branch, MD;  Location: [facility];  Service: Ophthalmology;  Laterality: Left;  CDE:7.54  . Cataract extraction w/phaco Right 04/15/2013    Procedure: CATARACT EXTRACTION PHACO AND INTRAOCULAR LENS PLACEMENT (IOC);  Surgeon: Tonny Branch, MD;  Location: [facility];  Service: Ophthalmology;  Laterality: Right;  CDE 9.38  . Mastectomy modified radical Left 11/04/2013    Procedure: LEFT MASTECTOMY MODIFIED RADICAL;  Surgeon: Jamesetta So, MD;  Location: [facility];  Service: General;  Laterality: Left;  . Portacath placement Right 12/06/2013    Procedure: INSERTION PORT-A-CATH-Right subclavian;  Surgeon: Jamesetta So, MD;  Location: [facility];  Service: General;  Laterality: Right;    There were no vitals filed for this  visit.  Visit Diagnosis:  Stiffness of hip joint, unspecified laterality  Midline low back pain without sciatica  Fatigue due to treatment  Bilateral low back pain without sciatica  Right-sided low back pain with right-sided sciatica      Subjective Assessment - 08/05/14 0800    Subjective Pt states that she walked quite a bit more than she has been over the weekend                 [facility] Adult PT Treatment/Exercise - 08/05/14 0001    Balance Poses: Yoga   Warrior I 60 seconds   Warrior II 60 seconds   Lumbar Exercises: Machines for Strengthening   Cybex Lumbar Extension 3 pl x 15    Cybex Knee Extension 2.5pl x 15    Leg Press 6.5 x 15   Lumbar Exercises: Standing   Heel Raises 15 reps   Heel Raises Limitations with functional squat with yellow ball    Functional Squats 15 reps   Forward Lunge 10 reps   Forward Lunge Limitations 2# UE goes into flexion    Side Lunge 10 reps   Side Lunge Limitations 2 # UE goes into abduction at 90 degrees.    Other Standing Lumbar Exercises 3 D hip excusion x 3      Nustep Level 5 x 15' hills level 3  Balance Exercises - 08/05/14 0823    Balance Exercises: Standing   Tandem Stance Eyes open;5 reps  with head turns    SLS Eyes open;3 reps             PT Short Term Goals - 07/14/14 1226    PT SHORT TERM GOAL #1   Title Pt to be I in HEP   Status Achieved   PT SHORT TERM GOAL #2   Title Pt to have stated that she has started a walking program at home   Status On-going   PT [place] #3   Title Patient will demonstrate the efficiency of her car transfers, will be able to perform with independence and no fatigue, no difficulty with transfer    Baseline 7/11- mod I with these activities.  Pt is driving herself to appointments now.   Status Partially Met   PT SHORT TERM GOAL #4   Title Patient will demonstrate the ability to pick an object up from the floor with more ease secondary to have gained at least 15  degrees lumbar flexion    Baseline can pick up an object of the floor with greater ease she has not gained 15 degrees in her lumbar flexion    Time 4   Period Weeks   Status Partially Met   PT SHORT TERM GOAL #5   Title Patient will demonstrate the ability to ambulate at least 1427f during 6 minute walk and will be able to tolerate at least 15 minutes of exericse on Nustep secondary to improved functional activity tolerance    Baseline on nustep on hills level 4 x 15 minutes 07/14/2014    Period Weeks   Status Achieved           PT Long Term Goals - 07/14/14 1230    PT LONG TERM GOAL #1   Title I in advance HEP   Time 4   Period Weeks   Status Achieved   PT LONG TERM GOAL #2   Title Pt to be walking for at lease 15 minutes a day 3x a week   Baseline pt is not walking on a regular basis despite encouragement.  States that there is not a good place to walk.   Time 4   Period Weeks   Status On-going   PT LONG TERM GOAL #3   Title Pt to be able to come sit to stand without use of UE assist, good eccentric contorl with descent    Baseline able to come sit to stnd without her UE assist with effort but does not demonstrate good eccentric control with descent    Status Partially Met   PT LONG TERM GOAL #4   Title Pt to be able to SLS for 10 seconds for reduce risk of falling   Time 4   Status Achieved   PT LONG TERM GOAL #5   Title Pt TUG to be 12 or less to demonstrate decreased risk of falling    Baseline 5/13- at best 11.11 seconds    Status Achieved   PT LONG TERM GOAL #6   Title FACIT-F to have improved by 25 points   Baseline 5/13- FACIT-F total score 111 now 120    Status On-going               Plan - 08/05/14 0841    Clinical Impression Statement Pt balance has increased and is walking more outside of therapy.  Pt able to do  more without having to take rest breaks.  Hip ROM remains limited but pt is working on self stretching at home.  Skilled therapy to  continue to emphasis functional balance activities and functional strengthening.    Pt will benefit from skilled therapeutic intervention in order to improve on the following deficits Decreased activity tolerance;Decreased balance;Decreased endurance;Decreased strength;Difficulty walking;Decreased range of motion   PT Treatment/Interventions Therapeutic activities;Therapeutic exercise;Balance training;Functional mobility training;Patient/family education;Manual techniques   PT Next Visit Plan continue to emphasis balance and strength.  additional dynamic stability tasks as able.         Problem List Patient Active Problem List   Diagnosis Date Noted  . Genetic testing 04/29/2014  . Family history of ovarian cancer   . Family history of colon cancer   . Family history of pancreatic cancer   . Breast cancer 11/04/2013  . Low back pain 11/12/2012   Rayetta Humphrey, PT CLT [phone] 08/05/2014, 9:03 AM  [place] [address] [place], [place], [address] Phone: [phone]   Fax:  [phone]

## 2014-08-07 ENCOUNTER — Ambulatory Visit (HOSPITAL_COMMUNITY): Payer: Medicare Other | Admitting: Physical Therapy

## 2014-08-07 DIAGNOSIS — M545 Low back pain, unspecified: Secondary | ICD-10-CM

## 2014-08-07 DIAGNOSIS — M25659 Stiffness of unspecified hip, not elsewhere classified: Secondary | ICD-10-CM | POA: Diagnosis not present

## 2014-08-07 DIAGNOSIS — R5383 Other fatigue: Secondary | ICD-10-CM

## 2014-08-07 NOTE — Therapy (Signed)
[place], [place], [address] Phone: [phone]   Fax:  [phone]  Physical Therapy Treatment (Re-Assessment)  Patient Details  Name: Tiffany Sweeney MRN: [id number] Date of Birth: 1934-12-03 Referring Provider:  Patrici Ranks, MD  Encounter Date: 08/07/2014      PT End of Session - 08/07/14 0847    Visit Number 36   Number of Visits 42   Date for PT Re-Evaluation 09/04/14   Authorization Type medicare    Authorization Time Period G-code completed 36th session    Authorization - Visit Number 36   Authorization - Number of Visits 81   PT Start Time 0803   PT Stop Time 0845   PT Time Calculation (min) 42 min   Activity Tolerance Patient tolerated treatment well   Behavior During Therapy [facility] for tasks assessed/performed      Past Medical History  Diagnosis Date  . High cholesterol   . Hypertension   . Anxiety   . Cancer     breast- right  . Arthritis   . Breast cancer   . Family history of ovarian cancer   . Family history of colon cancer   . Family history of pancreatic cancer     Past Surgical History  Procedure Laterality Date  . Abdominal hysterectomy    . Mastectomy Right   . Cataract extraction w/phaco Left 03/11/2013    Procedure: CATARACT EXTRACTION PHACO AND INTRAOCULAR LENS PLACEMENT (IOC);  Surgeon: Tonny Branch, MD;  Location: [facility];  Service: Ophthalmology;  Laterality: Left;  CDE:7.54  . Cataract extraction w/phaco Right 04/15/2013    Procedure: CATARACT EXTRACTION PHACO AND INTRAOCULAR LENS PLACEMENT (IOC);  Surgeon: Tonny Branch, MD;  Location: [facility];  Service: Ophthalmology;  Laterality: Right;  CDE 9.38  . Mastectomy modified radical Left 11/04/2013    Procedure: LEFT MASTECTOMY MODIFIED RADICAL;  Surgeon: Jamesetta So, MD;  Location: [facility];  Service: General;  Laterality: Left;  . Portacath placement Right 12/06/2013    Procedure: INSERTION PORT-A-CATH-Right subclavian;   Surgeon: Jamesetta So, MD;  Location: [facility];  Service: General;  Laterality: Right;    There were no vitals filed for this visit.  Visit Diagnosis:  Stiffness of hip joint, unspecified laterality - Plan: PT plan of care cert/re-cert  Midline low back pain without sciatica - Plan: PT plan of care cert/re-cert  Fatigue due to treatment - Plan: PT plan of care cert/re-cert      Subjective Assessment - 08/07/14 0806    Subjective Patient reports that she is  not having any pain this morning, just feeling a bit stiff    Pertinent History Breast cancer; Low back pain   How long can you sit comfortably? 8/4- no limits    How long can you stand comfortably? 8/4- no limits    How long can you walk comfortably? 8/4- 45 minutes is still pretty much her max distance    Currently in Pain? No/denies            [facility] PT Assessment - 08/07/14 0001    Assessment   Medical Diagnosis fatigue/difficulty walking   Onset Date/Surgical Date 12/03/13   Next MD Visit [place] this fall    Prior Function   Vocation Retired   Leisure reading, cooking, gardening    Observation/Other Assessments   Observations TUG 9.75, 9.78, 10.75   Sit to Stand   Comments 5x sit to stand 15 seconds    AROM  Right Hip External Rotation  40   Right Hip Internal Rotation  11   Left Hip External Rotation  36   Left Hip Internal Rotation  26   Lumbar Flexion 45   Lumbar Extension 12   Lumbar - Right Side Bend 25   Lumbar - Left Side Bend 24   Strength   Right Hip Flexion 2/5  painful    Right Hip Extension 3+/5   Right Hip ABduction 5/5  within available range    Left Hip Flexion 5/5   Left Hip Extension 3+/5   Left Hip ABduction 3+/5   Right Knee Flexion 4+/5   Right Knee Extension 4/5  knee painful this morning, couldn't tolerate MMT well    Left Knee Flexion 4+/5   Left Knee Extension 5/5   Right Ankle Dorsiflexion 4+/5   Left Ankle Dorsiflexion 5/5                     [facility] Adult  PT Treatment/Exercise - 08/07/14 0001    Knee/Hip Exercises: Stretches   Active Hamstring Stretch Both;2 reps;30 seconds   Active Hamstring Stretch Limitations 12 inch box    Gastroc Stretch Both;2 reps;30 seconds   Gastroc Stretch Limitations slantboard                 PT Education - 08/07/14 0846    Education provided Yes   Education Details progress with skilled PT services, plan of care moving forward    Person(s) Educated Patient   Methods Explanation   Comprehension Verbalized understanding          PT Short Term Goals - 08/07/14 1173    PT SHORT TERM GOAL #1   Title Pt to be I in HEP   Time 1   Status Achieved   PT SHORT TERM GOAL #2   Title Pt to have stated that she has started a walking program at home   Baseline 8/4- coming along slowly, it is a work in progress    Time 2   Period Weeks   Status On-going   PT [place] #3   Title Patient will demonstrate the efficiency of her car transfers, will be able to perform with independence and no fatigue, no difficulty with transfer    Baseline 8/4- patient feels like she is still slow with this but it is getting better    Time 4   Period Weeks   Status Partially Met   PT SHORT TERM GOAL #4   Title Patient will demonstrate the ability to pick an object up from the floor with more ease secondary to have gained at least 15 degrees lumbar flexion    Baseline can pick up an object of the floor with greater ease she has not gained 15 degrees in her lumbar flexion    Time 4   Period Weeks   Status Partially Met   PT SHORT TERM GOAL #5   Title Patient will demonstrate the ability to ambulate at least 1437f during 6 minute walk and will be able to tolerate at least 15 minutes of exericse on Nustep secondary to improved functional activity tolerance    Time 4   Period Weeks   Status Achieved           PT Long Term Goals - 08/07/14 05670   PT LONG TERM GOAL #1   Title I in advance HEP   Time 4    Period Weeks  Status Achieved   PT LONG TERM GOAL #2   Title Pt to be walking for at lease 15 minutes a day 3x a week   Baseline 2022/09/01- patient reports that she is not walking every day but does definitely meet the 3x/week goal    Time 4   Period Weeks   Status Achieved   PT LONG TERM GOAL #3   Title Pt to be able to come sit to stand without use of UE assist, good eccentric contorl with descent    Baseline 2022/09/01- still struggling with descent, states that she has a harder time at home on different surfaces and heights    Time 4   Period Weeks   Status Partially Met   PT LONG TERM GOAL #4   Title Pt to be able to SLS for 10 seconds for reduce risk of falling   Time 4   Period Weeks   Status Achieved   PT LONG TERM GOAL #5   Title Pt TUG to be 12 or less to demonstrate decreased risk of falling    Baseline 09/01/22- worst time 10.75 seconds, best 9.75 seconds    Time 4   Period Weeks   Status Achieved   PT LONG TERM GOAL #6   Title FACIT-F to have improved by 25 points   Time 4   Period Weeks   Status On-going               Plan - Sep 01, 2014 0848    Clinical Impression Statement Re-assessment performed today. Patient does not demonstrate any significant gains in general ROM or reductions in stiffness but does appear to have improved in both dynamic balance and functional activity tolerance. Patient reports she has noticed she is feeling better but is still concerned about her ability to get in/out of car, pick things up from floor; she also reports that even though she is continuing to be limtied by her R hip she is not interested in a second opinion regarding the hip at this time.Patient also reports she is doing better with walking program; discussed [employer] however patient reports she already has membership and does not use it much at all partially due to concern about edema on her feet. At this time patient will benefit from 6 more session of skilled PT services, to focus on functional  items such as  picking things up from floor safely, continuation of balance, functional activity tolerance, etc, in order to attempt to address patient's remaining functional concerns and assist her in reaching an optimal level of function.     Pt will benefit from skilled therapeutic intervention in order to improve on the following deficits Decreased activity tolerance;Decreased balance;Decreased endurance;Decreased strength;Difficulty walking;Decreased range of motion   Rehab Potential Good   PT Frequency Other (comment)  6 more sessions    PT Duration Other (comment)  6 more sessions    PT Treatment/Interventions Therapeutic activities;Therapeutic exercise;Balance training;Functional mobility training;Patient/family education;Manual techniques   PT Next Visit Plan continue to emphasis balance and strength.  additional dynamic stability tasks as able. DC after 6 more sessions.    PT Home Exercise Plan no change today   Consulted and Agree with Plan of Care Patient          G-Codes - 2014/09/01 0853    Functional Assessment Tool Used skilled clinical assessment based on mobility, joint stiffness, balance, functional endurance, functional mobility   Functional Limitation Mobility: Walking and moving around   Mobility: Walking and Moving  Around Current Status ([id number]) At least 20 percent but less than 40 percent impaired, limited or restricted   Mobility: Walking and Moving Around Goal Status [phone]) At least 1 percent but less than 20 percent impaired, limited or restricted      Problem List Patient Active Problem List   Diagnosis Date Noted  . Genetic testing 04/29/2014  . Family history of ovarian cancer   . Family history of colon cancer   . Family history of pancreatic cancer   . Breast cancer 11/04/2013  . Low back pain 11/12/2012    Physical Therapy Progress Note  Dates of Reporting Period: 07/14/14 to 08/07/14  Objective Reports of Subjective Statement: patient reports she  is feeling better but still has concerns about balance, getting items off floor, getting out of car, etc   Objective Measurements: see above   Goal Update: see above   Plan: 6 more sessions focusing on functional tasks and balance/stability before discharge to appropriate HEP   Reason Skilled Services are Required: impaired mobility, balance/stability, sit to stand safely from surfaces of various heights and textures, functional activity tolerance, development of appropriate HEP for home management after [facility], DPT [place], [place], [address] Phone: [phone]   Fax:  [phone]

## 2014-08-11 ENCOUNTER — Ambulatory Visit (HOSPITAL_COMMUNITY): Payer: Medicare Other | Admitting: Physical Therapy

## 2014-08-11 DIAGNOSIS — M545 Low back pain, unspecified: Secondary | ICD-10-CM

## 2014-08-11 DIAGNOSIS — M25659 Stiffness of unspecified hip, not elsewhere classified: Secondary | ICD-10-CM | POA: Diagnosis not present

## 2014-08-11 DIAGNOSIS — M5441 Lumbago with sciatica, right side: Secondary | ICD-10-CM

## 2014-08-11 DIAGNOSIS — R5383 Other fatigue: Secondary | ICD-10-CM

## 2014-08-11 DIAGNOSIS — M5442 Lumbago with sciatica, left side: Secondary | ICD-10-CM

## 2014-08-11 NOTE — Therapy (Signed)
[place], [place], [address] Phone: [phone]   Fax:  [phone]  Physical Therapy Treatment  Patient Details  Name: Tiffany Sweeney MRN: [id number] Date of Birth: 01-22-1934 Referring Provider:  Patrici Ranks, MD  Encounter Date: 08/11/2014      PT End of Session - 08/11/14 1107    Visit Number 37   Number of Visits 42   Date for PT Re-Evaluation 09/04/14   Authorization Type medicare    Authorization Time Period G-code completed 36th session    Authorization - Visit Number [id number]   Authorization - Number of Visits 44   PT Start Time 0804   PT Stop Time 9937   PT Time Calculation (min) 50 min   Activity Tolerance Patient tolerated treatment well   Behavior During Therapy [facility] for tasks assessed/performed      Past Medical History  Diagnosis Date  . High cholesterol   . Hypertension   . Anxiety   . Cancer     breast- right  . Arthritis   . Breast cancer   . Family history of ovarian cancer   . Family history of colon cancer   . Family history of pancreatic cancer     Past Surgical History  Procedure Laterality Date  . Abdominal hysterectomy    . Mastectomy Right   . Cataract extraction w/phaco Left 03/11/2013    Procedure: CATARACT EXTRACTION PHACO AND INTRAOCULAR LENS PLACEMENT (IOC);  Surgeon: Tonny Branch, MD;  Location: [facility];  Service: Ophthalmology;  Laterality: Left;  CDE:7.54  . Cataract extraction w/phaco Right 04/15/2013    Procedure: CATARACT EXTRACTION PHACO AND INTRAOCULAR LENS PLACEMENT (IOC);  Surgeon: Tonny Branch, MD;  Location: [facility];  Service: Ophthalmology;  Laterality: Right;  CDE 9.38  . Mastectomy modified radical Left 11/04/2013    Procedure: LEFT MASTECTOMY MODIFIED RADICAL;  Surgeon: Jamesetta So, MD;  Location: [facility];  Service: General;  Laterality: Left;  . Portacath placement Right 12/06/2013    Procedure: INSERTION PORT-A-CATH-Right subclavian;  Surgeon: Jamesetta So, MD;  Location: [facility];  Service: General;  Laterality: Right;    There were no vitals filed for this visit.  Visit Diagnosis:  Stiffness of hip joint, unspecified laterality  Midline low back pain without sciatica  Fatigue due to treatment  Bilateral low back pain without sciatica  Right-sided low back pain with right-sided sciatica  Midline low back pain with left-sided sciatica      Subjective Assessment - 08/11/14 1104    Subjective PT states she is doing well.  No pain just constant stiffness   Currently in Pain? No/denies                         [facility] Adult PT Treatment/Exercise - 08/11/14 0810    Lumbar Exercises: Machines for Strengthening   Cybex Lumbar Extension 3 pl x 15    Cybex Knee Extension 2.5pl x 15    Cybex Knee Flexion 4pl x 15   Leg Press 6.5 x 15   Other Lumbar Machine Exercise rows 1pl X 15   Lumbar Exercises: Standing   Heel Raises 15 reps   Heel Raises Limitations with functional squat 12" box with yellow ball    Forward Lunge 10 reps   Forward Lunge Limitations 2# UE goes into flexion    Side Lunge 10 reps   Side Lunge Limitations 2 # UE goes into abduction at  90 degrees.    Knee/Hip Exercises: Stretches   Active Hamstring Stretch Both;2 reps;30 seconds   Active Hamstring Stretch Limitations 12 inch box    Hip Flexor Stretch Right;2 reps;30 seconds   Gastroc Stretch Both;2 reps;30 seconds   Gastroc Stretch Limitations slantboard    Knee/Hip Exercises: Aerobic   Stationary Bike nustep hills 3 L4 x 15'                   PT Short Term Goals - 08/07/14 1607    PT SHORT TERM GOAL #1   Title Pt to be I in HEP   Time 1   Status Achieved   PT SHORT TERM GOAL #2   Title Pt to have stated that she has started a walking program at home   Baseline 8/4- coming along slowly, it is a work in progress    Time 2   Period Weeks   Status On-going   PT [place] #3   Title Patient will demonstrate the  efficiency of her car transfers, will be able to perform with independence and no fatigue, no difficulty with transfer    Baseline 8/4- patient feels like she is still slow with this but it is getting better    Time 4   Period Weeks   Status Partially Met   PT SHORT TERM GOAL #4   Title Patient will demonstrate the ability to pick an object up from the floor with more ease secondary to have gained at least 15 degrees lumbar flexion    Baseline can pick up an object of the floor with greater ease she has not gained 15 degrees in her lumbar flexion    Time 4   Period Weeks   Status Partially Met   PT SHORT TERM GOAL #5   Title Patient will demonstrate the ability to ambulate at least 1467f during 6 minute walk and will be able to tolerate at least 15 minutes of exericse on Nustep secondary to improved functional activity tolerance    Time 4   Period Weeks   Status Achieved           PT Long Term Goals - 08/07/14 03710   PT LONG TERM GOAL #1   Title I in advance HEP   Time 4   Period Weeks   Status Achieved   PT LONG TERM GOAL #2   Title Pt to be walking for at lease 15 minutes a day 3x a week   Baseline 8/4- patient reports that she is not walking every day but does definitely meet the 3x/week goal    Time 4   Period Weeks   Status Achieved   PT LONG TERM GOAL #3   Title Pt to be able to come sit to stand without use of UE assist, good eccentric contorl with descent    Baseline 8/4- still struggling with descent, states that she has a harder time at home on different surfaces and heights    Time 4   Period Weeks   Status Partially Met   PT LONG TERM GOAL #4   Title Pt to be able to SLS for 10 seconds for reduce risk of falling   Time 4   Period Weeks   Status Achieved   PT LONG TERM GOAL #5   Title Pt TUG to be 12 or less to demonstrate decreased risk of falling    Baseline 8/4- worst time 10.75 seconds, best 9.75 seconds  Time 4   Period Weeks   Status Achieved    PT LONG TERM GOAL #6   Title FACIT-F to have improved by 25 points   Time 4   Period Weeks   Status On-going               Plan - 08/11/14 1108    Clinical Impression Statement continued with focus on improving activity tolerance and functional strength.  Pt with minimal LOB today with forward and lateral lunge actvitiy using 3# weights and increased ease getting on/off cybex equipment.    PT Frequency --  6 more sessions    PT Duration --  6 more sessions    PT Treatment/Interventions Therapeutic activities;Therapeutic exercise;Balance training;Functional mobility training;Patient/family education;Manual techniques   PT Next Visit Plan continue to emphasis balance and strength.  additional dynamic stability tasks as able. DC after 5 more sessions.    Consulted and Agree with Plan of Care Patient        Problem List Patient Active Problem List   Diagnosis Date Noted  . Genetic testing 04/29/2014  . Family history of ovarian cancer   . Family history of colon cancer   . Family history of pancreatic cancer   . Breast cancer 11/04/2013  . Low back pain 11/12/2012    Teena Irani, PTA/CLT [id number]  08/11/2014, 11:14 AM  [place], [place], [address] Phone: [phone]   Fax:  [phone]

## 2014-08-13 ENCOUNTER — Ambulatory Visit (HOSPITAL_COMMUNITY): Payer: Medicare Other

## 2014-08-13 DIAGNOSIS — M25659 Stiffness of unspecified hip, not elsewhere classified: Secondary | ICD-10-CM | POA: Diagnosis not present

## 2014-08-13 DIAGNOSIS — M545 Low back pain, unspecified: Secondary | ICD-10-CM

## 2014-08-13 DIAGNOSIS — M5442 Lumbago with sciatica, left side: Secondary | ICD-10-CM

## 2014-08-13 DIAGNOSIS — M5441 Lumbago with sciatica, right side: Secondary | ICD-10-CM

## 2014-08-13 DIAGNOSIS — R5383 Other fatigue: Secondary | ICD-10-CM

## 2014-08-13 NOTE — Therapy (Signed)
[place], [place], [address] Phone: [phone]   Fax:  [phone]  Physical Therapy Treatment  Patient Details  Name: Tiffany Sweeney MRN: [id number] Date of Birth: [birth year] Referring Provider:  Patrici Ranks, MD  Encounter Date: 08/13/2014      PT End of Session - 08/13/14 0808    Visit Number 38   Number of Visits 42   Date for PT Re-Evaluation 09/04/14   Authorization Type medicare    Authorization Time Period G-code completed 36th session    Authorization - Visit Number 38   Authorization - Number of Visits 53   PT Start Time 0804   PT Stop Time 5993   PT Time Calculation (min) 53 min   Activity Tolerance Patient tolerated treatment well   Behavior During Therapy [facility] for tasks assessed/performed      Past Medical History  Diagnosis Date  . High cholesterol   . Hypertension   . Anxiety   . Cancer     breast- right  . Arthritis   . Breast cancer   . Family history of ovarian cancer   . Family history of colon cancer   . Family history of pancreatic cancer     Past Surgical History  Procedure Laterality Date  . Abdominal hysterectomy    . Mastectomy Right   . Cataract extraction w/phaco Left 03/11/2013    Procedure: CATARACT EXTRACTION PHACO AND INTRAOCULAR LENS PLACEMENT (IOC);  Surgeon: Tonny Branch, MD;  [facility]: [facility];  Service: Ophthalmology;  Laterality: Left;  CDE:7.54  . Cataract extraction w/phaco Right 04/15/2013    Procedure: CATARACT EXTRACTION PHACO AND INTRAOCULAR LENS PLACEMENT (IOC);  Surgeon: Tonny Branch, MD;  Location: [facility];  Service: Ophthalmology;  Laterality: Right;  CDE 9.38  . Mastectomy modified radical Left 11/04/2013    Procedure: LEFT MASTECTOMY MODIFIED RADICAL;  Surgeon: Jamesetta So, MD;  Location: [facility];  Service: General;  Laterality: Left;  . Portacath placement Right 12/06/2013    Procedure: INSERTION PORT-A-CATH-Right subclavian;  Surgeon: Jamesetta So, MD;  Location: [facility];  Service: General;  Laterality: Right;    There were no vitals filed for this visit.  Visit Diagnosis:  Stiffness of hip joint, unspecified laterality  Midline low back pain without sciatica  Fatigue due to treatment  Bilateral low back pain without sciatica  Midline low back pain with left-sided sciatica  Right-sided low back pain with right-sided sciatica      Subjective Assessment - 08/13/14 0807    Subjective Pt stated she is doing okay today, no reports of pain.     Currently in Pain? No/denies                         [facility] Adult PT Treatment/Exercise - 08/13/14 0001    Lumbar Exercises: Machines for Strengthening   Cybex Lumbar Extension 3 pl x 20   Cybex Knee Extension 2.5pl x 15    Cybex Knee Flexion 4pl x 15   Leg Press 6.5 x 15   Other Lumbar Machine Exercise rows 1pl X 15   Other Lumbar Machine Exercise Chest press 2PL x 15   Lumbar Exercises: Standing   Heel Raises 15 reps   Heel Raises Limitations with functional squat 12" box with yellow ball    Forward Lunge 10 reps   Forward Lunge Limitations 2# UE goes into flexion    Side Lunge 10 reps  Side Lunge Limitations 2 # UE goes into abduction at 90 degrees.    Other Standing Lumbar Exercises warrior pose I and II 2x30"   Other Standing Lumbar Exercises --   Knee/Hip Exercises: Aerobic   Stationary Bike nustep hills 3 L4 x 15'                   PT Short Term Goals - 08/13/14 1062    PT SHORT TERM GOAL #1   Title Pt to be I in HEP   Status Achieved   PT SHORT TERM GOAL #2   Title Pt to have stated that she has started a walking program at home   Status On-going   PT [place] #3   Title Patient will demonstrate the efficiency of her car transfers, will be able to perform with independence and no fatigue, no difficulty with transfer    Status On-going   PT SHORT TERM GOAL #4   Title Patient will demonstrate the ability to pick an object  up from the floor with more ease secondary to have gained at least 15 degrees lumbar flexion    Status On-going   PT SHORT TERM GOAL #5   Title Patient will demonstrate the ability to ambulate at least 1445f during 6 minute walk and will be able to tolerate at least 15 minutes of exericse on Nustep secondary to improved functional activity tolerance    Status Achieved           PT Long Term Goals - 08/07/14 0837    PT LONG TERM GOAL #1   Title I in advance HEP   Time 4   Period Weeks   Status Achieved   PT LONG TERM GOAL #2   Title Pt to be walking for at lease 15 minutes a day 3x a week   Baseline 8/4- patient reports that she is not walking every day but does definitely meet the 3x/week goal    Time 4   Period Weeks   Status Achieved   PT LONG TERM GOAL #3   Title Pt to be able to come sit to stand without use of UE assist, good eccentric contorl with descent    Baseline 8/4- still struggling with descent, states that she has a harder time at home on different surfaces and heights    Time 4   Period Weeks   Status Partially Met   PT LONG TERM GOAL #4   Title Pt to be able to SLS for 10 seconds for reduce risk of falling   Time 4   Period Weeks   Status Achieved   PT LONG TERM GOAL #5   Title Pt TUG to be 12 or less to demonstrate decreased risk of falling    Baseline 8/4- worst time 10.75 seconds, best 9.75 seconds    Time 4   Period Weeks   Status Achieved   PT LONG TERM GOAL #6   Title FACIT-F to have improved by 25 points   Time 4   Period Weeks   Status On-going               Plan - 08/13/14 0810    Clinical Impression Statement Session focus on functional strengthening and improving activity tolerance.  With with 2 LOB episodes while positioning into warrior poses, able to regain balance iindpendendlty wth min guard. Pt improving but does continue to have limited mobiility getting on and off the cybex machines.   PT  Next Visit Plan continue to emphasis  balance and strength.  additional dynamic stability tasks as able. DC after 4 more sessions.         Problem List Patient Active Problem List   Diagnosis Date Noted  . Genetic testing 04/29/2014  . Family history of ovarian cancer   . Family history of colon cancer   . Family history of pancreatic cancer   . Breast cancer 11/04/2013  . Low back pain 11/12/2012   Ihor Austin, [place]; [place]  Aldona Lento 08/13/2014, 8:57 AM  [place], [place], [address] Phone: [phone]   Fax:  [phone]

## 2014-08-15 ENCOUNTER — Ambulatory Visit (HOSPITAL_COMMUNITY): Payer: Medicare Other

## 2014-08-15 DIAGNOSIS — M5442 Lumbago with sciatica, left side: Secondary | ICD-10-CM

## 2014-08-15 DIAGNOSIS — M545 Low back pain, unspecified: Secondary | ICD-10-CM

## 2014-08-15 DIAGNOSIS — M25659 Stiffness of unspecified hip, not elsewhere classified: Secondary | ICD-10-CM

## 2014-08-15 DIAGNOSIS — M5441 Lumbago with sciatica, right side: Secondary | ICD-10-CM

## 2014-08-15 DIAGNOSIS — R5383 Other fatigue: Secondary | ICD-10-CM

## 2014-08-15 NOTE — Therapy (Signed)
[place], [place], [address] Phone: [phone]   Fax:  [phone]  Physical Therapy Treatment  Patient Details  Name: Tiffany Sweeney MRN: [id number] Date of Birth: June 13, 1934 Referring Provider:  Lemmie Evens, MD  Encounter Date: 08/15/2014      PT End of Session - 08/15/14 0811    Visit Number 39   Number of Visits 42   Date for PT Re-Evaluation 09/04/14   Authorization Type medicare    Authorization Time Period G-code completed 36th session    Authorization - Visit Number 39   Authorization - Number of Visits 70   PT Start Time 0801   PT Stop Time 0905   PT Time Calculation (min) 64 min   Activity Tolerance Patient tolerated treatment well   Behavior During Therapy [facility] for tasks assessed/performed      Past Medical History  Diagnosis Date  . High cholesterol   . Hypertension   . Anxiety   . Cancer     breast- right  . Arthritis   . Breast cancer   . Family history of ovarian cancer   . Family history of colon cancer   . Family history of pancreatic cancer     Past Surgical History  Procedure Laterality Date  . Abdominal hysterectomy    . Mastectomy Right   . Cataract extraction w/phaco Left 03/11/2013    Procedure: CATARACT EXTRACTION PHACO AND INTRAOCULAR LENS PLACEMENT (IOC);  Surgeon: Tonny Branch, MD;  Location: [facility];  Service: Ophthalmology;  Laterality: Left;  CDE:7.54  . Cataract extraction w/phaco Right 04/15/2013    Procedure: CATARACT EXTRACTION PHACO AND INTRAOCULAR LENS PLACEMENT (IOC);  Surgeon: Tonny Branch, MD;  Location: [facility];  Service: Ophthalmology;  Laterality: Right;  CDE 9.38  . Mastectomy modified radical Left 11/04/2013    Procedure: LEFT MASTECTOMY MODIFIED RADICAL;  Surgeon: Jamesetta So, MD;  Location: [facility];  Service: General;  Laterality: Left;  . Portacath placement Right 12/06/2013    Procedure: INSERTION PORT-A-CATH-Right subclavian;  Surgeon: Jamesetta So, MD;  Location: [facility];  Service: General;  Laterality: Right;    There were no vitals filed for this visit.  Visit Diagnosis:  Stiffness of hip joint, unspecified laterality  Midline low back pain without sciatica  Fatigue due to treatment  Bilateral low back pain without sciatica  Midline low back pain with left-sided sciatica  Right-sided low back pain with right-sided sciatica      Subjective Assessment - 08/15/14 0807    Subjective Pt stated back is stiff this morning, no real pain   Currently in Pain? No/denies   Pain Score 0-No pain   Pain Location Back   Pain Descriptors / Indicators Tightness             [facility] Adult PT Treatment/Exercise - 08/15/14 0001    Exercises   Exercises Knee/Hip   Lumbar Exercises: Stretches   Hip Flexor Stretch 2 reps;30 seconds   Hip Flexor Stretch Limitations 12in step   Lumbar Exercises: Aerobic   Stationary Bike nustep level 4 x15 minutes at end of session   Lumbar Exercises: Machines for Strengthening   Cybex Lumbar Extension 3 pl x 20   Cybex Knee Extension 2.5pl x 20   Cybex Knee Flexion 4pl x 20   Leg Press 6.5 x 20   Other Lumbar Machine Exercise rows 1.5 pl X 20   Other Lumbar Machine Exercise Chest press 2PL x 20  Lumbar Exercises: Standing   Heel Raises 15 reps   Heel Raises Limitations with functional squat 12" box with yellow ball    Forward Lunge 10 reps   Forward Lunge Limitations 2# UE goes into flexion    Other Standing Lumbar Exercises warrior pose I and II 2x30"   Other Standing Lumbar Exercises 3D hip excursion 10x                  PT Short Term Goals - 08/13/14 5621    PT SHORT TERM GOAL #1   Title Pt to be I in HEP   Status Achieved   PT SHORT TERM GOAL #2   Title Pt to have stated that she has started a walking program at home   Status On-going   PT [place] #3   Title Patient will demonstrate the efficiency of her car transfers, will be able to perform with  independence and no fatigue, no difficulty with transfer    Status On-going   PT SHORT TERM GOAL #4   Title Patient will demonstrate the ability to pick an object up from the floor with more ease secondary to have gained at least 15 degrees lumbar flexion    Status On-going   PT SHORT TERM GOAL #5   Title Patient will demonstrate the ability to ambulate at least 1435f during 6 minute walk and will be able to tolerate at least 15 minutes of exericse on Nustep secondary to improved functional activity tolerance    Status Achieved           PT Long Term Goals - 08/07/14 0837    PT LONG TERM GOAL #1   Title I in advance HEP   Time 4   Period Weeks   Status Achieved   PT LONG TERM GOAL #2   Title Pt to be walking for at lease 15 minutes a day 3x a week   Baseline 8/4- patient reports that she is not walking every day but does definitely meet the 3x/week goal    Time 4   Period Weeks   Status Achieved   PT LONG TERM GOAL #3   Title Pt to be able to come sit to stand without use of UE assist, good eccentric contorl with descent    Baseline 8/4- still struggling with descent, states that she has a harder time at home on different surfaces and heights    Time 4   Period Weeks   Status Partially Met   PT LONG TERM GOAL #4   Title Pt to be able to SLS for 10 seconds for reduce risk of falling   Time 4   Period Weeks   Status Achieved   PT LONG TERM GOAL #5   Title Pt TUG to be 12 or less to demonstrate decreased risk of falling    Baseline 8/4- worst time 10.75 seconds, best 9.75 seconds    Time 4   Period Weeks   Status Achieved   PT LONG TERM GOAL #6   Title FACIT-F to have improved by 25 points   Time 4   Period Weeks   Status On-going               Plan - 08/15/14 0827    Clinical Impression Statement Session focus on improving activity tolerance and functional strengthening.  Began session with 3D hip excursion and hip flexor stretches to improve mobility and  reduce stiffness.  Pt improving mobility getting on  and off the cybex machines, reports family members notice improved mobility though she stated still feels ackward.  Pt continues to have significant difficulty with proper lifting techniques with min assistance and verbal cueing for proper form.  Pt c/o heel cramping during forward lunges this session.  No reports of increased pain throuhg session.   PT Next Visit Plan continue to emphasis balance and strength.  additional dynamic stability tasks as able. DC after 3 more sessions.         Problem List Patient Active Problem List   Diagnosis Date Noted  . Genetic testing 04/29/2014  . Family history of ovarian cancer   . Family history of colon cancer   . Family history of pancreatic cancer   . Breast cancer 11/04/2013  . Low back pain 11/12/2012   Ihor Austin, [place]; [place]  Aldona Lento 08/15/2014, 9:01 AM  [place], [place], [address] Phone: [phone]   Fax:  [phone]

## 2014-08-18 ENCOUNTER — Ambulatory Visit (HOSPITAL_COMMUNITY): Payer: Medicare Other | Admitting: Physical Therapy

## 2014-08-18 DIAGNOSIS — M5441 Lumbago with sciatica, right side: Secondary | ICD-10-CM

## 2014-08-18 DIAGNOSIS — M545 Low back pain, unspecified: Secondary | ICD-10-CM

## 2014-08-18 DIAGNOSIS — M5442 Lumbago with sciatica, left side: Secondary | ICD-10-CM

## 2014-08-18 DIAGNOSIS — M25659 Stiffness of unspecified hip, not elsewhere classified: Secondary | ICD-10-CM

## 2014-08-18 DIAGNOSIS — R5383 Other fatigue: Secondary | ICD-10-CM

## 2014-08-18 NOTE — Therapy (Signed)
[facility], [place], [address] Phone: [phone]   Fax:  [phone]  Physical Therapy Treatment  Patient Details  Name: Tiffany Sweeney MRN: [id number] Date of Birth: 1934-08-07 Referring Provider:  Patrici Ranks, MD  Encounter Date: 08/18/2014      PT End of Session - 08/18/14 0928    Visit Number 40   Number of Visits 42   Date for PT Re-Evaluation 09/04/14   Authorization Type medicare    Authorization Time Period G-code completed 36th session    Authorization - Visit Number 52   Authorization - Number of Visits 35   PT Start Time 0850   PT Stop Time 0945   PT Time Calculation (min) 55 min   Activity Tolerance Patient tolerated treatment well   Behavior During Therapy [facility] for tasks assessed/performed      Past Medical History  Diagnosis Date  . High cholesterol   . Hypertension   . Anxiety   . Cancer     breast- right  . Arthritis   . Breast cancer   . Family history of ovarian cancer   . Family history of colon cancer   . Family history of pancreatic cancer     Past Surgical History  Procedure Laterality Date  . Abdominal hysterectomy    . Mastectomy Right   . Cataract extraction w/phaco Left 03/11/2013    Procedure: CATARACT EXTRACTION PHACO AND INTRAOCULAR LENS PLACEMENT (IOC);  Surgeon: Tonny Branch, MD;  Location: [facility];  Service: Ophthalmology;  Laterality: Left;  CDE:7.54  . Cataract extraction w/phaco Right 04/15/2013    Procedure: CATARACT EXTRACTION PHACO AND INTRAOCULAR LENS PLACEMENT (IOC);  Surgeon: Tonny Branch, MD;  Location: [facility];  Service: Ophthalmology;  Laterality: Right;  CDE 9.38  . Mastectomy modified radical Left 11/04/2013    Procedure: LEFT MASTECTOMY MODIFIED RADICAL;  Surgeon: Jamesetta So, MD;  Location: [facility];  Service: General;  Laterality: Left;  . Portacath placement Right 12/06/2013    Procedure: INSERTION PORT-A-CATH-Right subclavian;  Surgeon: Jamesetta So, MD;  Location: [facility];  Service: General;  Laterality: Right;    There were no vitals filed for this visit.  Visit Diagnosis:  Stiffness of hip joint, unspecified laterality  Midline low back pain without sciatica  Fatigue due to treatment  Bilateral low back pain without sciatica  Midline low back pain with left-sided sciatica  Right-sided low back pain with right-sided sciatica      Subjective Assessment - 08/18/14 0943    Subjective PT reports she was lazy over the weekend watching the olympics.  currently wtihout pain.   Currently in Pain? No/denies                         [facility] Adult PT Treatment/Exercise - 08/18/14 0850    Lumbar Exercises: Stretches   Hip Flexor Stretch 2 reps;30 seconds   Hip Flexor Stretch Limitations 12in step   Piriformis Stretch --   Piriformis Stretch Limitations --   Lumbar Exercises: Aerobic   Stationary Bike nustep hills 3, level 4 x15 minutes at end of session   Lumbar Exercises: Machines for Strengthening   Cybex Lumbar Extension 3 pl x 20   Cybex Knee Extension 2.5pl x 20   Cybex Knee Flexion 5pl x 20   Leg Press 6.5 x 20   Other Lumbar Machine Exercise rows 1.5 pl X 20   Other Lumbar Machine Exercise  Chest press 2PL x 20   Lumbar Exercises: Standing   Heel Raises 15 reps   Heel Raises Limitations with functional squat 12" box with yellow ball    Forward Lunge 10 reps   Forward Lunge Limitations 2# UE goes into flexion    Side Lunge 10 reps   Side Lunge Limitations 2 # UE goes into abduction at 90 degrees.    Knee/Hip Exercises: Stretches   Active Hamstring Stretch Both;2 reps;30 seconds   Active Hamstring Stretch Limitations 12 inch box    Gastroc Stretch Both;2 reps;30 seconds   Gastroc Stretch Limitations slantboard                   PT Short Term Goals - 08/13/14 2334    PT SHORT TERM GOAL #1   Title Pt to be I in HEP   Status Achieved   PT SHORT TERM GOAL #2   Title Pt to have  stated that she has started a walking program at home   Status On-going   PT [place] #3   Title Patient will demonstrate the efficiency of her car transfers, will be able to perform with independence and no fatigue, no difficulty with transfer    Status On-going   PT SHORT TERM GOAL #4   Title Patient will demonstrate the ability to pick an object up from the floor with more ease secondary to have gained at least 15 degrees lumbar flexion    Status On-going   PT SHORT TERM GOAL #5   Title Patient will demonstrate the ability to ambulate at least 1483f during 6 minute walk and will be able to tolerate at least 15 minutes of exericse on Nustep secondary to improved functional activity tolerance    Status Achieved           PT Long Term Goals - 08/07/14 0837    PT LONG TERM GOAL #1   Title I in advance HEP   Time 4   Period Weeks   Status Achieved   PT LONG TERM GOAL #2   Title Pt to be walking for at lease 15 minutes a day 3x a week   Baseline 8/4- patient reports that she is not walking every day but does definitely meet the 3x/week goal    Time 4   Period Weeks   Status Achieved   PT LONG TERM GOAL #3   Title Pt to be able to come sit to stand without use of UE assist, good eccentric contorl with descent    Baseline 8/4- still struggling with descent, states that she has a harder time at home on different surfaces and heights    Time 4   Period Weeks   Status Partially Met   PT LONG TERM GOAL #4   Title Pt to be able to SLS for 10 seconds for reduce risk of falling   Time 4   Period Weeks   Status Achieved   PT LONG TERM GOAL #5   Title Pt TUG to be 12 or less to demonstrate decreased risk of falling    Baseline 8/4- worst time 10.75 seconds, best 9.75 seconds    Time 4   Period Weeks   Status Achieved   PT LONG TERM GOAL #6   Title FACIT-F to have improved by 25 points   Time 4   Period Weeks   Status On-going  Plan - 08/18/14 8329     Clinical Impression Statement Emphasis on strength and stability today.  Noted difficulty with forward lunges requiring min assist from therapist for stability.  Attempted to increase weights on cybex machines, however unable to complete with exception of hamstring curls.  Continued with nustep at [facility] to work on actvitiy tolerance.    PT Next Visit Plan continue to emphasis balance and strength.  additional dynamic stability tasks as able. DC after 2 more sessions.         Problem List Patient Active Problem List   Diagnosis Date Noted  . Genetic testing 04/29/2014  . Family history of ovarian cancer   . Family history of colon cancer   . Family history of pancreatic cancer   . Breast cancer 11/04/2013  . Low back pain 11/12/2012    Teena Irani, PTA/CLT [phone]  08/18/2014, 9:54 AM  [facility] [address] [place], [place], [address] Phone: [phone]   Fax:  [phone]

## 2014-08-20 ENCOUNTER — Ambulatory Visit (HOSPITAL_COMMUNITY): Payer: Medicare Other

## 2014-08-20 ENCOUNTER — Ambulatory Visit (HOSPITAL_COMMUNITY): Payer: Medicare Other | Admitting: Physical Therapy

## 2014-08-20 DIAGNOSIS — M25659 Stiffness of unspecified hip, not elsewhere classified: Secondary | ICD-10-CM | POA: Diagnosis not present

## 2014-08-20 DIAGNOSIS — R5383 Other fatigue: Secondary | ICD-10-CM

## 2014-08-20 DIAGNOSIS — M5442 Lumbago with sciatica, left side: Secondary | ICD-10-CM

## 2014-08-20 DIAGNOSIS — M545 Low back pain, unspecified: Secondary | ICD-10-CM

## 2014-08-20 DIAGNOSIS — M5441 Lumbago with sciatica, right side: Secondary | ICD-10-CM

## 2014-08-20 NOTE — Therapy (Addendum)
[place] [place], [place], [address] Phone: [phone]   Fax:  [phone]  Physical Therapy Treatment  Patient Details  Name: Tiffany Sweeney MRN: [id number] Date of Birth: 06-Sep-1934 Referring Provider:  Patrici Ranks, MD  Encounter Date: 08/20/2014      PT End of Session - 08/20/14 0904    Visit Number 41   Number of Visits 42   Date for PT Re-Evaluation 09/04/14   Authorization Type medicare    Authorization Time Period G-code completed 36th session    Authorization - Visit Number 73   Authorization - Number of Visits 19   PT Start Time 0803   PT Stop Time 0846   PT Time Calculation (min) 43 min   Activity Tolerance Patient tolerated treatment well   Behavior During Therapy [facility] for tasks assessed/performed      Past Medical History  Diagnosis Date  . High cholesterol   . Hypertension   . Anxiety   . Cancer     breast- right  . Arthritis   . Breast cancer   . Family history of ovarian cancer   . Family history of colon cancer   . Family history of pancreatic cancer     Past Surgical History  Procedure Laterality Date  . Abdominal hysterectomy    . Mastectomy Right   . Cataract extraction w/phaco Left 03/11/2013    Procedure: CATARACT EXTRACTION PHACO AND INTRAOCULAR LENS PLACEMENT (IOC);  Surgeon: Tonny Branch, MD;  Location: [facility];  Service: Ophthalmology;  Laterality: Left;  CDE:7.54  . Cataract extraction w/phaco Right 04/15/2013    Procedure: CATARACT EXTRACTION PHACO AND INTRAOCULAR LENS PLACEMENT (IOC);  Surgeon: Tonny Branch, MD;  Location: [facility];  Service: Ophthalmology;  Laterality: Right;  CDE 9.38  . Mastectomy modified radical Left 11/04/2013    Procedure: LEFT MASTECTOMY MODIFIED RADICAL;  Surgeon: Jamesetta So, MD;  Location: [facility];  Service: General;  Laterality: Left;  . Portacath placement Right 12/06/2013    Procedure: INSERTION PORT-A-CATH-Right subclavian;  Surgeon: Jamesetta So, MD;  Location: [facility];  Service: General;  Laterality: Right;    There were no vitals filed for this visit.  Visit Diagnosis:  Stiffness of hip joint, unspecified laterality  Midline low back pain without sciatica  Fatigue due to treatment  Bilateral low back pain without sciatica  Midline low back pain with left-sided sciatica  Right-sided low back pain with right-sided sciatica      Subjective Assessment - 08/20/14 0807    Subjective Pain free just stiff today   Currently in Pain? No/denies   Pain Descriptors / Indicators Tightness            [facility] Adult PT Treatment/Exercise - 08/20/14 0001    Lumbar Exercises: Aerobic   Stationary Bike nustep hills 3, level 4 x15 minutes at end of session   Lumbar Exercises: Machines for Strengthening   Cybex Lumbar Extension 3 pl x 20   Cybex Knee Extension 2.5pl x 20   Cybex Knee Flexion 5pl x 20   Leg Press 6.5 x 20   Lumbar Exercises: Standing   Heel Raises 15 reps   Heel Raises Limitations with functional squat 12" box with yellow ball    Forward Lunge 10 reps   Forward Lunge Limitations 2# UE goes into flexion    Side Lunge 10 reps   Side Lunge Limitations 2 # UE goes into abduction at 90 degrees.  PT Short Term Goals - 08/13/14 7341    PT SHORT TERM GOAL #1   Title Pt to be I in HEP   Status Achieved   PT SHORT TERM GOAL #2   Title Pt to have stated that she has started a walking program at home   Status On-going   PT [place] #3   Title Patient will demonstrate the efficiency of her car transfers, will be able to perform with independence and no fatigue, no difficulty with transfer    Status On-going   PT SHORT TERM GOAL #4   Title Patient will demonstrate the ability to pick an object up from the floor with more ease secondary to have gained at least 15 degrees lumbar flexion    Status On-going   PT SHORT TERM GOAL #5   Title Patient will demonstrate the ability to ambulate at  least 1438f during 6 minute walk and will be able to tolerate at least 15 minutes of exericse on Nustep secondary to improved functional activity tolerance    Status Achieved           PT Long Term Goals - 08/07/14 0837    PT LONG TERM GOAL #1   Title I in advance HEP   Time 4   Period Weeks   Status Achieved   PT LONG TERM GOAL #2   Title Pt to be walking for at lease 15 minutes a day 3x a week   Baseline 8/4- patient reports that she is not walking every day but does definitely meet the 3x/week goal    Time 4   Period Weeks   Status Achieved   PT LONG TERM GOAL #3   Title Pt to be able to come sit to stand without use of UE assist, good eccentric contorl with descent    Baseline 8/4- still struggling with descent, states that she has a harder time at home on different surfaces and heights    Time 4   Period Weeks   Status Partially Met   PT LONG TERM GOAL #4   Title Pt to be able to SLS for 10 seconds for reduce risk of falling   Time 4   Period Weeks   Status Achieved   PT LONG TERM GOAL #5   Title Pt TUG to be 12 or less to demonstrate decreased risk of falling    Baseline 8/4- worst time 10.75 seconds, best 9.75 seconds    Time 4   Period Weeks   Status Achieved   PT LONG TERM GOAL #6   Title FACIT-F to have improved by 25 points   Time 4   Period Weeks   Status On-going               Plan - 08/20/14 0905    Clinical Impression Statement Session focus on strengthening and stabiltiy, therapist facilitation for proper form with proper lifting today and noted instabiility with forward lunges requiring min assist for LOB episodes.  Pt improving mobility with ability to get on and off of Cybex machines with increased ease.  Improving activity tolerance, no rest breaks required this session.   PT Next Visit Plan Reassessment next session for anticipated DC        Problem List Patient Active Problem List   Diagnosis Date Noted  . Genetic testing  04/29/2014  . Family history of ovarian cancer   . Family history of colon cancer   . Family history of pancreatic  cancer   . Breast cancer 11/04/2013  . Low back pain 11/12/2012   Ihor Austin, LPTA; [place]  Aldona Lento 08/20/2014, 9:32 AM  [facility] [address] [place], [place], [address] Phone: [phone]   Fax:  [phone]   PHYSICAL THERAPY DISCHARGE SUMMARY  Visits from Start of Care: 40  Current functional level related to goals / functional outcomes: 3/4 met   Remaining deficits: Difficult to put on shoes    Education / Equipment: HEP  Plan: Patient agrees to discharge.  Patient goals were partially met. Patient is being discharged due to being pleased with the current functional level.  ?????  02/18/2015     Rayetta Humphrey, [place] CLT [phone]

## 2014-08-22 ENCOUNTER — Ambulatory Visit (HOSPITAL_COMMUNITY): Payer: Medicare Other | Admitting: Physical Therapy

## 2014-08-26 ENCOUNTER — Ambulatory Visit (HOSPITAL_COMMUNITY)
Admission: RE | Admit: 2014-08-26 | Discharge: 2014-08-26 | Disposition: A | Discharge status: Home / Self Care | Payer: Medicare Other | Source: Ambulatory Visit | Attending: Hematology & Oncology | Admitting: Hematology & Oncology

## 2014-08-26 ENCOUNTER — Encounter (HOSPITAL_COMMUNITY): Payer: Medicare Other | Attending: Hematology and Oncology

## 2014-08-26 DIAGNOSIS — C50919 Malignant neoplasm of unspecified site of unspecified female breast: Secondary | ICD-10-CM | POA: Diagnosis not present

## 2014-08-26 DIAGNOSIS — M810 Age-related osteoporosis without current pathological fracture: Secondary | ICD-10-CM | POA: Insufficient documentation

## 2014-08-26 DIAGNOSIS — Z452 Encounter for adjustment and management of vascular access device: Secondary | ICD-10-CM

## 2014-08-26 DIAGNOSIS — C50911 Malignant neoplasm of unspecified site of right female breast: Secondary | ICD-10-CM | POA: Insufficient documentation

## 2014-08-26 DIAGNOSIS — I10 Essential (primary) hypertension: Secondary | ICD-10-CM | POA: Insufficient documentation

## 2014-08-26 DIAGNOSIS — C50912 Malignant neoplasm of unspecified site of left female breast: Secondary | ICD-10-CM | POA: Insufficient documentation

## 2014-08-26 DIAGNOSIS — Z170 Estrogen receptor positive status [ER+]: Secondary | ICD-10-CM | POA: Insufficient documentation

## 2014-08-26 DIAGNOSIS — Z780 Asymptomatic menopausal state: Secondary | ICD-10-CM

## 2014-08-26 DIAGNOSIS — C50412 Malignant neoplasm of upper-outer quadrant of left female breast: Secondary | ICD-10-CM | POA: Diagnosis not present

## 2014-08-26 MED ORDER — HEPARIN SOD (PORK) LOCK FLUSH 100 UNIT/ML IV SOLN
500.0000 [IU] | Freq: Once | INTRAVENOUS | Status: AC
  Administered 2014-08-26: 500 [IU] via INTRAVENOUS
  Filled 2014-08-26: qty 5

## 2014-08-26 MED ORDER — SODIUM CHLORIDE 0.9 % IJ SOLN
10.0000 mL | INTRAMUSCULAR | Status: AC | PRN
  Administered 2014-08-26: 10 mL via INTRAVENOUS
  Filled 2014-08-26: qty 10

## 2014-08-26 NOTE — Progress Notes (Unsigned)
..  Tiffany Sweeney presented for Portacath access and flush.  Proper placement of portacath confirmed by CXR.  Portacath located rt chest wall accessed with  H 20 needle.  Good blood return present. Portacath flushed with 67ml NS and 500U/49ml Heparin and needle removed intact.  Procedure tolerated well and without incident.

## 2014-09-23 ENCOUNTER — Encounter (HOSPITAL_COMMUNITY): Payer: Self-pay | Admitting: Hematology & Oncology

## 2014-09-23 ENCOUNTER — Encounter (HOSPITAL_COMMUNITY): Payer: Medicare Other | Attending: Hematology and Oncology | Admitting: Hematology & Oncology

## 2014-09-23 ENCOUNTER — Encounter (HOSPITAL_COMMUNITY): Payer: Medicare Other

## 2014-09-23 VITALS — BP 144/86 | HR 70 | Temp 98.6°F | Resp 70 | Wt 157.0 lb

## 2014-09-23 DIAGNOSIS — Z170 Estrogen receptor positive status [ER+]: Secondary | ICD-10-CM | POA: Diagnosis not present

## 2014-09-23 DIAGNOSIS — M8580 Other specified disorders of bone density and structure, unspecified site: Secondary | ICD-10-CM | POA: Diagnosis not present

## 2014-09-23 DIAGNOSIS — M810 Age-related osteoporosis without current pathological fracture: Secondary | ICD-10-CM | POA: Diagnosis not present

## 2014-09-23 DIAGNOSIS — C50911 Malignant neoplasm of unspecified site of right female breast: Secondary | ICD-10-CM | POA: Diagnosis present

## 2014-09-23 DIAGNOSIS — C50919 Malignant neoplasm of unspecified site of unspecified female breast: Secondary | ICD-10-CM | POA: Diagnosis present

## 2014-09-23 DIAGNOSIS — M25551 Pain in right hip: Secondary | ICD-10-CM | POA: Diagnosis not present

## 2014-09-23 DIAGNOSIS — C50912 Malignant neoplasm of unspecified site of left female breast: Secondary | ICD-10-CM | POA: Insufficient documentation

## 2014-09-23 DIAGNOSIS — I10 Essential (primary) hypertension: Secondary | ICD-10-CM | POA: Diagnosis not present

## 2014-09-23 LAB — CBC WITH DIFFERENTIAL/PLATELET
BASOS ABS: 0 10*3/uL (ref 0.0–0.1)
Basophils Relative: 1 %
EOS PCT: 3 %
Eosinophils Absolute: 0.2 10*3/uL (ref 0.0–0.7)
HEMATOCRIT: 40.6 % (ref 36.0–46.0)
Hemoglobin: 13.7 g/dL (ref 12.0–15.0)
LYMPHS ABS: 1.6 10*3/uL (ref 0.7–4.0)
LYMPHS PCT: 28 %
MCH: 30.2 pg (ref 26.0–34.0)
MCHC: 33.7 g/dL (ref 30.0–36.0)
MCV: 89.4 fL (ref 78.0–100.0)
MONOS PCT: 7 %
Monocytes Absolute: 0.4 10*3/uL (ref 0.1–1.0)
NEUTROS ABS: 3.5 10*3/uL (ref 1.7–7.7)
Neutrophils Relative %: 61 %
PLATELETS: 228 10*3/uL (ref 150–400)
RBC: 4.54 MIL/uL (ref 3.87–5.11)
RDW: 13.3 % (ref 11.5–15.5)
WBC: 5.7 10*3/uL (ref 4.0–10.5)

## 2014-09-23 LAB — COMPREHENSIVE METABOLIC PANEL
ALT: 15 U/L (ref 14–54)
AST: 19 U/L (ref 15–41)
Albumin: 3.7 g/dL (ref 3.5–5.0)
Alkaline Phosphatase: 66 U/L (ref 38–126)
Anion gap: 7 (ref 5–15)
BILIRUBIN TOTAL: 0.6 mg/dL (ref 0.3–1.2)
BUN: 12 mg/dL (ref 6–20)
CO2: 29 mmol/L (ref 22–32)
Calcium: 9 mg/dL (ref 8.9–10.3)
Chloride: 106 mmol/L (ref 101–111)
Creatinine, Ser: 0.79 mg/dL (ref 0.44–1.00)
Glucose, Bld: 109 mg/dL — ABNORMAL HIGH (ref 65–99)
POTASSIUM: 3.7 mmol/L (ref 3.5–5.1)
Sodium: 142 mmol/L (ref 135–145)
TOTAL PROTEIN: 6.6 g/dL (ref 6.5–8.1)

## 2014-09-23 MED ORDER — SODIUM CHLORIDE 0.9 % IJ SOLN
10.0000 mL | INTRAMUSCULAR | Status: DC | PRN
  Administered 2014-09-23: 10 mL via INTRAVENOUS
  Filled 2014-09-23: qty 10

## 2014-09-23 MED ORDER — HEPARIN SOD (PORK) LOCK FLUSH 100 UNIT/ML IV SOLN
500.0000 [IU] | Freq: Once | INTRAVENOUS | Status: AC
  Administered 2014-09-23: 500 [IU] via INTRAVENOUS

## 2014-09-23 MED ORDER — HEPARIN SOD (PORK) LOCK FLUSH 100 UNIT/ML IV SOLN
INTRAVENOUS | Status: AC
  Filled 2014-09-23: qty 5

## 2014-09-23 NOTE — Progress Notes (Signed)
Tiffany Sweeney presented for Portacath access and flush.  Portacath located right chest wall accessed with  H 20 needle.  Good blood return present. Portacath flushed with 88ml NS and 500U/10ml Heparin and needle removed intact.  Procedure tolerated well and without incident.

## 2014-09-23 NOTE — Progress Notes (Signed)
Tiffany Bellow, MD [address] [place] [address]  Stage I triple negative carcinoma of the left breast s/p mastectomy and axillary LN disssection. History of Carcinoma of the R breast  CURRENT THERAPY: Observation  INTERVAL HISTORY: Tiffany Sweeney 80 y.o. female returns for follow-up of her breast cancer. She continues to improve. She states she is much better but still has problems with neuropathy.  The patient is alone today.  Her family is on vacation at the [employer].  She did not attend this vacation because she feels incapable of walking in the 3 story house the family rented that has no elevator or walking in the "fine" sand.  She states that she has difficult walking "any sort of distance".   She accounts this to fatigue, chemotherapy, and an ongoing hip pain.  She has been to physical therapy for both her leg and hip in the past.  She now does rehabilitation exercises at home daily to attempt to improve her current pain and discomfort.  She notes that she has noticed small changes but no real difference from before.  She admits that her hip limits her greatly.  The patient's feet are still "dicey" when she first gets up in the morning.  This improves once she gets going.  She notes that it is difficult to sit down and get up from the couch and the toilet due to her hip. She complains of significant R groin pain. She has consulted other doctors who have advised the patient to visit an orthopedic surgeon.  She has no opinion about using a cane or walker. Her goals include being able to clean her house without using a "grabber."  She currently cannot get clothes out of her dryer nor bend down to to get anything secondary to persistent R hip pain.   The patient questions being able to feel bone versus muscle pain.   In the morning when she wakes up, she experiences a pain in her R arm ranging on a scale from 1 to 10 at about an "8".  This goes away with movement.  She  notes that this pain could be positional issues from while she is sleeping. It just started several days ago.   She states that she was more active before her breast cancer diagnosis.  She has complaints of a dizziness that occurs for a minimal time period if she lays flat.  She denies headaches or problems with vision.  She takes a stool softener daily.  She denies any change in appetite. She denies changes in her bowel habits.  She does not take calcium but takes vitamin D.   MEDICAL HISTORY: Past Medical History  Diagnosis Date  . High cholesterol   . Hypertension   . Anxiety   . Cancer     breast- right  . Arthritis   . Breast cancer   . Family history of ovarian cancer   . Family history of colon cancer   . Family history of pancreatic cancer     has Low back pain; Breast cancer; Family history of ovarian cancer; Family history of colon cancer; Family history of pancreatic cancer; and Genetic testing on her problem list.      Breast cancer   07/25/2013 Imaging Plain film of the lumbar spine, complete 4 view. Chronic lumbar degenerative change most prominent at L4-5. No acute abnormality and no change from prior study   10/21/2013 Initial Diagnosis Left breast cancer, core biopsy, ER  negative, PR negative, HER-2/neu negative, Ki-67 of 13%, infiltrating duct cell   11/04/2013 Surgery Left modified radical mastectomy, 1.8 cm infiltrating duct cell carcinoma, ER negative, PR negative, HER-2/neu not overexpressed, 9 lymph nodes negative. Stage TI cN0 M0.   12/06/2013 Imaging Portable chest x-ray showing no pneumothorax no lung edema or consolidation Port-A-Cath tip in the SVC.   12/23/2013 - 02/03/2014 Chemotherapy Cycle 1 of TC initiated on 12/23/2013, patient completed 3 cycles of prescirbed therapy     has No Known Allergies.  Tiffany Sweeney does not currently have medications on file.  SURGICAL HISTORY: Past Surgical History  Procedure Laterality Date  . Abdominal hysterectomy      . Mastectomy Right   . Cataract extraction w/phaco Left 03/11/2013    Procedure: CATARACT EXTRACTION PHACO AND INTRAOCULAR LENS PLACEMENT (IOC);  Surgeon: Tonny Branch, MD;  Location: [facility];  Service: Ophthalmology;  Laterality: Left;  CDE:7.54  . Cataract extraction w/phaco Right 04/15/2013    Procedure: CATARACT EXTRACTION PHACO AND INTRAOCULAR LENS PLACEMENT (IOC);  Surgeon: Tonny Branch, MD;  Location: [facility];  Service: Ophthalmology;  Laterality: Right;  CDE 9.38  . Mastectomy modified radical Left 11/04/2013    Procedure: LEFT MASTECTOMY MODIFIED RADICAL;  Surgeon: Jamesetta So, MD;  Location: [facility];  Service: General;  Laterality: Left;  . Portacath placement Right 12/06/2013    Procedure: INSERTION PORT-A-CATH-Right subclavian;  Surgeon: Jamesetta So, MD;  Location: [facility];  Service: General;  Laterality: Right;    SOCIAL HISTORY: Social History   Social History  . Marital Status: Widowed    Spouse Name: N/A  . Number of Children: 3  . Years of Education: N/A   Occupational History  . Not on file.   Social History Main Topics  . Smoking status: Former Smoker -- 0.25 packs/day for 30 years    Types: Cigarettes    Start date: 10/10/1956    Quit date: 03/08/1985  . Smokeless tobacco: Never Used     Comment: quit 25 years ago  . Alcohol Use: No  . Drug Use: No  . Sexual Activity: Yes    Birth Control/ Protection: Post-menopausal   Other Topics Concern  . Not on file   Social History Narrative    FAMILY HISTORY: Family History  Problem Relation Age of Onset  . Diabetes Mother   . Rectal cancer Mother 41    died from complications of DM  . Dementia Father     Died at 23 in an accident  . Pancreatic cancer Brother 20  . Lung cancer Sister 42    "small oat cell"  . Ovarian cancer Sister 83  . Cancer Cousin     maternal 1st cousin with either ovarian or uterine  . Colon cancer Cousin     paternal first cousin dx at unknown age    Review of Systems   Constitutional: Positive for malaise/fatigue. Negative for fever, chills and weight loss.  HENT: Negative for congestion, hearing loss, nosebleeds, sore throat and tinnitus.   Eyes: Negative for blurred vision, double vision, pain and discharge.  Respiratory: Negative for cough, hemoptysis, sputum production, shortness of breath and wheezing.   Cardiovascular: Negative for chest pain, palpitations, claudication, leg swelling and PND.  Gastrointestinal: Negative for heartburn, nausea, vomiting, abdominal pain, diarrhea, constipation, blood in stool and melena.  Genitourinary: Negative for dysuria, urgency, frequency and hematuria.  Musculoskeletal: Negative for myalgias and falls.       Chronic LE swelling , joint pain Skin:  Negative for itching and rash.  Neurological: Positive for sensory change and weakness,. Negative for tingling, tremors, speech change, focal weakness, seizures, loss of consciousness and headaches.  Endo/Heme/Allergies: Does not bruise/bleed easily.  Psychiatric/Behavioral: Negative for depression, suicidal ideas, memory loss and substance abuse. The patient is not nervous/anxious and does not have insomnia.   14 point review of systems was performed and is negative except as detailed under history of present illness and above  PHYSICAL EXAMINATION  ECOG PERFORMANCE STATUS: 1 - Symptomatic but completely ambulatory  Filed Vitals:   09/23/14 1115  BP: 144/86  Pulse: 70  Temp: 98.6 F (37 C)  Resp: 70    Physical Exam  Constitutional: She is oriented to person, place, and time and well-developed, well-nourished, and in no distress. hair regrowth. Well groomed. HENT:  Head: Normocephalic and atraumatic.  Nose: Nose normal.  Mouth/Throat: Oropharynx is clear and moist. No oropharyngeal exudate.  Eyes: Conjunctivae and EOM are normal. Pupils are equal, round, and reactive to light. Right eye exhibits no discharge. Left eye exhibits no discharge. No scleral icterus.   Neck: Normal range of motion. Neck supple. No tracheal deviation present. No thyromegaly present.  Cardiovascular: Normal rate, regular rhythm and normal heart sounds.  Exam reveals no gallop and no friction rub.   No murmur heard. Pulmonary/Chest: Effort normal and breath sounds normal. She has no wheezes. She has no rales.  Abdominal: Soft. Bowel sounds are normal. She exhibits no distension and no mass. There is no tenderness. There is no rebound and no guarding.  Musculoskeletal: Normal range of motion. She exhibits no edema. R leg with external rotation during gait Left lower extremity ankle swelling is chronic  Lymphadenopathy:    She has no cervical adenopathy.  Neurological: She is alert and oriented to person, place, and time. She has normal reflexes. No cranial nerve deficit. Coordination normal.  Skin: Skin is warm and dry. No rash noted.  Psychiatric: Mood, memory, affect and judgment normal.  Chest: Bilateral mastectomy sites are well healed without erythema or palpable abnormalities. Nursing note and vitals reviewed.  RADIOLOGY: I have reviewed the studies listed below:  EXAM: DUAL X-RAY ABSORPTIOMETRY (DXA) FOR BONE MINERAL DENSITY  IMPRESSION: Ordering Physician: Dr. Patrici Ranks,  Your patient Tiffany Sweeney completed a BMD test on 08/26/2014 using the [place] (software version: 14.10) manufactured by [employer]. The following summarizes the results of our evaluation. PATIENT BIOGRAPHICAL: Name: ADRIONNA, DELCID Patient ID: [id number] Birth Date: October 08, 1934 Height: 64.0 in. Gender: Female Exam Date: 08/26/2014 Weight: 158.0 lbs. Indications: Bilateral Oophrectomy, Caucasian, Hx Breast Ca, Post Menopausal Fractures: Treatments: Calcium, Vitamin D DENSITOMETRY RESULTS: Site Region Measured Date Measured Age WHO Classification Young Adult T-score BMD %Change vs. Previous Significant Change (*) [facility] Spine  L1-L3 10/26/2014 78.2 Normal 0.6 1.238 g/cm2  DualFemur Neck Left 08/26/2014 78.2 Osteopenia -1.6 0.813 g/cm2 ASSESSMENT: BMD as determined from Femur Neck Left is 0.813 g/cm2 with a T-Score of -1.6. This patient is considered osteopenic according to [facility]) criteria. Per official position of the ISCD, it is not possible to quantitatively compare BMD or calculate a LSC between different facilities. (L-4 was excluded due to advanced degenerative changes.)  World Health Organization [facility]) criteria for post-menopausal, Caucasian Women: Normal: T-score at or above -1 SD Osteopenia: T-score between -1 and -2.5 SD Osteoporosis: T-score at or below -2.5 SD  RECOMMENDATIONS: [place] recommends that FDA-approved medial therapies be considered in postmenopausal women and men  age 62 or older with a: 1. Hip or vertberal (clinical or morphometric) fracture. 2. T-Score of < -2.5 at the spine or hip. 3. Ten-year fracture probability by FRAX of 3% or greater for hip fracture or 20% or greater for major osteoporotic fracture.  All treatment decisions require clinical judgment and consideration of individual patient factors, including patient preferences, co-morbidities, previous drug use, risk factors not captured in the FRAX model (e.g. falls, vitamin D deficiency, increased bone turnover, interval significant decline in bone density) and possible under-or over-estimation of fracture risk by FRAX.  All patients should ensure an adequate intake of dietary calcium (1200 mg/d) and vitamin D (800 IU daily) unless contraindicated.  FOLLOW-UP: People with diagnosed cases of osteoporosis or osteopenia should be regularly tested for bone mineral density. For patients eligible for Medicare, routine testing is allowed once every 2 years. Testing frequency can be increased for patients who have rapidly progressing disease, or for those who  are receiving medical therapy to restore bone mass.  I have reviewed this report, and agree with the above findings.  [facility] Your patient Tiffany Sweeney completed a FRAX assessment on 08/26/2014 using the [place] (analysis version: 14.10) manufactured by [employer]. The following summarizes the results of our evaluation.  PATIENT BIOGRAPHICAL: Name: Tiffany Sweeney, Tiffany Sweeney Patient ID: [id number] Birth Date: 06-05-1934 Height: 64.0 in. Gender: Female [age] Weight: 158.0 lbs. Ethnicity: White Exam Date: 08/26/2014  FRAX* RESULTS: (version: 3.5) 10-year Probability of Fracture1 Major Osteoporotic Fracture2 Hip Fracture 13.3% 3.2% Population: [place] (Caucasian) Risk Factors: None  Based on Femur (Left) Neck BMD  1 -The 10-year probability of fracture may be lower than reported if the patient has received treatment. 2 -Major Osteoporotic Fracture: Clinical Spine, Forearm, Hip or Shoulder  *FRAX is a Materials engineer of the [employer] for Metabolic Bone Disease, a [place] (WHO) [facility].  ASSESSMENT: The probability of a major osteoporotic fracture is 13.3% within the next ten years.  The probability of a hip fracture is 3.2% within the next ten years.   Electronically Signed  By: David Martinique M.D.  On: 08/26/2014 16:21   LABORATORY DATA: I have reviewed the data below as listed.  CBC    Component Value Date/Time   WBC 5.7 09/23/2014 1125   WBC 6.7 10/10/2013 1010   WBC 7.9 07/06/2006 1141   RBC 4.54 09/23/2014 1125   RBC 4.70 10/10/2013 1010   RBC 4.45 07/06/2006 1141   HGB 13.7 09/23/2014 1125   HGB 14.0 10/10/2013 1010   HGB 13.0 07/06/2006 1141   HCT 40.6 09/23/2014 1125   HCT 41.8 10/10/2013 1010   HCT 38.2 07/06/2006 1141   PLT 228 09/23/2014 1125   PLT 220 10/10/2013 1010   PLT 272  07/06/2006 1141   MCV 89.4 09/23/2014 1125   MCV 89 10/10/2013 1010   MCV 85.8 07/06/2006 1141   MCH 30.2 09/23/2014 1125   MCH 29.8 10/10/2013 1010   MCH 29.3 07/06/2006 1141   MCHC 33.7 09/23/2014 1125   MCHC 33.5 10/10/2013 1010   MCHC 34.1 07/06/2006 1141   RDW 13.3 09/23/2014 1125   RDW 13.3 10/10/2013 1010   RDW 11.2* 07/06/2006 1141   LYMPHSABS 1.6 09/23/2014 1125   LYMPHSABS 2.0 10/10/2013 1010   LYMPHSABS 2.1 07/06/2006 1141   MONOABS 0.4 09/23/2014 1125   MONOABS 0.6 07/06/2006 1141   EOSABS 0.2 09/23/2014 1125   EOSABS 0.1 10/10/2013 1010   EOSABS 0.1  07/06/2006 1141   BASOSABS 0.0 09/23/2014 1125   BASOSABS 0.0 10/10/2013 1010   BASOSABS 0.1 07/06/2006 1141   CMP     Component Value Date/Time   NA 142 09/23/2014 1125   K 3.7 09/23/2014 1125   CL 106 09/23/2014 1125   CO2 29 09/23/2014 1125   GLUCOSE 109* 09/23/2014 1125   BUN 12 09/23/2014 1125   CREATININE 0.79 09/23/2014 1125   CALCIUM 9.0 09/23/2014 1125   PROT 6.6 09/23/2014 1125   ALBUMIN 3.7 09/23/2014 1125   AST 19 09/23/2014 1125   ALT 15 09/23/2014 1125   ALKPHOS 66 09/23/2014 1125   BILITOT 0.6 09/23/2014 1125   GFRNONAA >60 09/23/2014 1125   GFRAA >60 09/23/2014 1125    ASSESSMENT and THERAPY PLAN:   Stage I triple negative carcinoma of the left breast Mastectomy 3 cycles of Taxotere and Cytoxan, discontinued prior to cycle 4 secondary to excessive fatigue, hand/foot syndrome and worsening neuropathy History of breast cancer Family history of ovarian cancer Osteopenia   I reviewed her DEXA with her. Current recommendations are to include calcium daily with her vitamin D. I have also encouraged her to continue to stay as active as possible. On extensive questioning it appears that her hip pain is limiting her ADLs. I have encouraged her to consider a consultation with orthopedics. Although she states chemotherapy has limited her, realistically most of her ADL limitations revolve around her  hip.  She otherwise looks good. We discussed her arm pain and other issue she has been having. She is not interested in pursuing a bone scan or other imaging. She notes she will notify us if any of her symptoms worsen. She is open to an orthopedics consultation.  All questions were answered. The patient knows to call the clinic with any problems, questions or concerns. We can certainly see the patient much sooner if necessary.   Return in 3 months  This note was electronically signed.  This document serves as a record of services personally performed by Ancil Linsey, MD. It was created on her behalf by Janace Hoard, a trained medical scribe. The creation of this record is based on the scribe's personal observations and the provider's statements to them. This document has been checked and approved by the attending provider.  I have reviewed the above documentation for accuracy and completeness, and I agree with the above.  Kelby Fam. Whitney Muse, MD

## 2014-09-23 NOTE — Patient Instructions (Addendum)
[place] at [facility] Discharge Instructions  RECOMMENDATIONS MADE BY THE CONSULTANT AND ANY TEST RESULTS WILL BE SENT TO YOUR REFERRING PHYSICIAN. Exam and discussion by Dr. Whitney Muse Report any new lumps, bone pain, shortness of breath or other symptoms. The 2 orthopedists that she recommends are Dr. Gaynelle Arabian and Dr. Jonn Shingles.  Their number is [phone].  Follow-up: Port flushes as scheduled Office visit ibn 3 months.   Thank you for choosing [place] at [facility] to provide your oncology and hematology care.  To afford each patient quality time with our Diallo Ponder, please arrive at least 15 minutes before your scheduled appointment time.    You need to re-schedule your appointment should you arrive 10 or more minutes late.  We strive to give you quality time with our providers, and arriving late affects you and other patients whose appointments are after yours.  Also, if you no show three or more times for appointments you may be dismissed from the clinic at the providers discretion.     Again, thank you for choosing [facility].  Our hope is that these requests will decrease the amount of time that you wait before being seen by our physicians.       _____________________________________________________________  Should you have questions after your visit to [facility], please contact our office at [phone] between the hours of 8:30 a.m. and 4:30 p.m.  Voicemails left after 4:30 p.m. will not be returned until the following business day.  For prescription refill requests, have your pharmacy contact our office.

## 2014-09-23 NOTE — Patient Instructions (Signed)
Please see doctors encounter for more information 

## 2014-10-09 ENCOUNTER — Ambulatory Visit: Payer: Medicare Other | Admitting: Hematology & Oncology

## 2014-10-09 ENCOUNTER — Other Ambulatory Visit: Payer: Medicare Other | Admitting: Lab

## 2014-10-21 ENCOUNTER — Encounter (HOSPITAL_COMMUNITY): Payer: Medicare Other

## 2014-11-03 ENCOUNTER — Encounter (HOSPITAL_COMMUNITY): Payer: Medicare Other | Attending: Hematology and Oncology

## 2014-11-03 DIAGNOSIS — I10 Essential (primary) hypertension: Secondary | ICD-10-CM | POA: Insufficient documentation

## 2014-11-03 DIAGNOSIS — C50412 Malignant neoplasm of upper-outer quadrant of left female breast: Secondary | ICD-10-CM | POA: Diagnosis not present

## 2014-11-03 DIAGNOSIS — Z170 Estrogen receptor positive status [ER+]: Secondary | ICD-10-CM | POA: Insufficient documentation

## 2014-11-03 DIAGNOSIS — C50911 Malignant neoplasm of unspecified site of right female breast: Secondary | ICD-10-CM | POA: Insufficient documentation

## 2014-11-03 DIAGNOSIS — M810 Age-related osteoporosis without current pathological fracture: Secondary | ICD-10-CM | POA: Insufficient documentation

## 2014-11-03 DIAGNOSIS — Z452 Encounter for adjustment and management of vascular access device: Secondary | ICD-10-CM | POA: Diagnosis present

## 2014-11-03 DIAGNOSIS — Z95828 Presence of other vascular implants and grafts: Secondary | ICD-10-CM

## 2014-11-03 DIAGNOSIS — C50912 Malignant neoplasm of unspecified site of left female breast: Secondary | ICD-10-CM | POA: Insufficient documentation

## 2014-11-03 MED ORDER — SODIUM CHLORIDE 0.9 % IJ SOLN
10.0000 mL | Freq: Once | INTRAMUSCULAR | Status: AC
  Administered 2014-11-03: 10 mL via INTRAVENOUS

## 2014-11-03 MED ORDER — HEPARIN SOD (PORK) LOCK FLUSH 100 UNIT/ML IV SOLN
500.0000 [IU] | Freq: Once | INTRAVENOUS | Status: AC
  Administered 2014-11-03: 500 [IU] via INTRAVENOUS

## 2014-11-03 NOTE — Patient Instructions (Signed)
[place] at [facility] Discharge Instructions  RECOMMENDATIONS MADE BY THE CONSULTANT AND ANY TEST RESULTS WILL BE SENT TO YOUR REFERRING PHYSICIAN.  Port flush today as scheduled.  Return in 6-8 weeks for port flush.  Thank you for choosing [place] at [facility] to provide your oncology and hematology care.  To afford each patient quality time with our provider, please arrive at least 15 minutes before your scheduled appointment time.    You need to re-schedule your appointment should you arrive 10 or more minutes late.  We strive to give you quality time with our providers, and arriving late affects you and other patients whose appointments are after yours.  Also, if you no show three or more times for appointments you may be dismissed from the clinic at the providers discretion.     Again, thank you for choosing [facility].  Our hope is that these requests will decrease the amount of time that you wait before being seen by our physicians.       _____________________________________________________________  Should you have questions after your visit to [facility], please contact our office at [phone] between the hours of 8:30 a.m. and 4:30 p.m.  Voicemails left after 4:30 p.m. will not be returned until the following business day.  For prescription refill requests, have your pharmacy contact our office.

## 2014-11-03 NOTE — Progress Notes (Signed)
Tiffany Sweeney presented for Portacath access and flush. Proper placement of portacath confirmed by CXR. Portacath located right chest wall accessed with  H 20 needle. Good blood return present. Portacath flushed with 20ml NS and 500U/5ml Heparin and needle removed intact. Procedure without incident. Patient tolerated procedure well.   

## 2014-11-04 ENCOUNTER — Encounter (HOSPITAL_COMMUNITY): Payer: Medicare Other

## 2014-12-19 NOTE — Progress Notes (Signed)
Tiffany Bellow, MD [address] [place] [address]  Stage I triple negative carcinoma of the left breast s/p mastectomy and axillary LN disssection. History of Carcinoma of the R breast  CURRENT THERAPY: Observation  INTERVAL HISTORY: Tiffany Sweeney 81 y.o. female returns for follow-up of her breast cancer. She continues to improve. She states she is much better but still has problems with neuropathy.  She is overall doing well. Major problems revolve around her R hip.  We discussed this at her last visit and she notes she is going to finally get it addressed after the New Year.  She has been enjoying family. She cleans her house daily.   She notes recent onset of neck and shoulder pain that is worse at night.  Initially she though it was due to the way she was sleeping. However, the pain has become a little more constant and concerns her. She has no difficulty raising her arms over her head, she notes her ROM was better when she was doing therapy.   She denies any problems with her appetite. She denies any issues with her chest wall. She has not fallen any more.   She has an irritated area on the back of the left leg and would like this examined.   MEDICAL HISTORY: Past Medical History  Diagnosis Date  . High cholesterol   . Hypertension   . Anxiety   . Cancer [facility])     breast- right  . Arthritis   . Breast cancer ([place])   . Family history of ovarian cancer   . Family history of colon cancer   . Family history of pancreatic cancer     has Low back pain; Breast cancer ([place]); Family history of ovarian cancer; Family history of colon cancer; Family history of pancreatic cancer; and Genetic testing on her problem list.      Breast cancer ([place])   07/25/2013 Imaging Plain film of the lumbar spine, complete 4 view. Chronic lumbar degenerative change most prominent at L4-5. No acute abnormality and no change from prior study   10/21/2013 Initial Diagnosis Left  breast cancer, core biopsy, ER negative, PR negative, HER-2/neu negative, Ki-67 of 13%, infiltrating duct cell   11/04/2013 Surgery Left modified radical mastectomy, 1.8 cm infiltrating duct cell carcinoma, ER negative, PR negative, HER-2/neu not overexpressed, 9 lymph nodes negative. Stage TI cN0 M0.   12/06/2013 Imaging Portable chest x-ray showing no pneumothorax no lung edema or consolidation Port-A-Cath tip in the SVC.   12/23/2013 - 02/03/2014 Chemotherapy Cycle 1 of TC initiated on 12/23/2013, patient completed 3 cycles of prescirbed therapy     has No Known Allergies.  Tiffany Sweeney had no medications administered during this visit.  SURGICAL HISTORY: Past Surgical History  Procedure Laterality Date  . Abdominal hysterectomy    . Mastectomy Right   . Cataract extraction w/phaco Left 03/11/2013    Procedure: CATARACT EXTRACTION PHACO AND INTRAOCULAR LENS PLACEMENT (IOC);  Surgeon: Tonny Branch, MD;  Location: [facility];  Service: Ophthalmology;  Laterality: Left;  CDE:7.54  . Cataract extraction w/phaco Right 04/15/2013    Procedure: CATARACT EXTRACTION PHACO AND INTRAOCULAR LENS PLACEMENT (IOC);  Surgeon: Tonny Branch, MD;  Location: [facility];  Service: Ophthalmology;  Laterality: Right;  CDE 9.38  . Mastectomy modified radical Left 11/04/2013    Procedure: LEFT MASTECTOMY MODIFIED RADICAL;  Surgeon: Jamesetta So, MD;  Location: [facility];  Service: General;  Laterality: Left;  . Portacath placement Right 12/06/2013  Procedure: INSERTION PORT-A-CATH-Right subclavian;  Surgeon: Jamesetta So, MD;  Location: [facility];  Service: General;  Laterality: Right;    SOCIAL HISTORY: Social History   Social History  . Marital Status: Widowed    Spouse Name: N/A  . Number of Children: 3  . Years of Education: N/A   Occupational History  . Not on file.   Social History Main Topics  . Smoking status: Former Smoker -- 0.25 packs/day for 30 years    Types: Cigarettes    Start date: 10/10/1956    Quit  date: 03/08/1985  . Smokeless tobacco: Never Used     Comment: quit 25 years ago  . Alcohol Use: No  . Drug Use: No  . Sexual Activity: Yes    Birth Control/ Protection: Post-menopausal   Other Topics Concern  . Not on file   Social History Narrative    FAMILY HISTORY: Family History  Problem Relation Age of Onset  . Diabetes Mother   . Rectal cancer Mother 50    died from complications of DM  . Dementia Father     Died at 28 in an accident  . Pancreatic cancer Brother 75  . Lung cancer Sister 59    "small oat cell"  . Ovarian cancer Sister 39  . Cancer Cousin     maternal 1st cousin with either ovarian or uterine  . Colon cancer Cousin     paternal first cousin dx at unknown age    Review of Systems  Constitutional: Positive for malaise/fatigue. Negative for fever, chills and weight loss.  HENT: Negative for congestion, hearing loss, nosebleeds, sore throat and tinnitus.   Eyes: Negative for blurred vision, double vision, pain and discharge.  Respiratory: Negative for cough, hemoptysis, sputum production, shortness of breath and wheezing.   Cardiovascular: Negative for chest pain, palpitations, claudication, leg swelling and PND.  Gastrointestinal: Negative for heartburn, nausea, vomiting, abdominal pain, diarrhea, constipation, blood in stool and melena.  Genitourinary: Negative for dysuria, urgency, frequency and hematuria.  Musculoskeletal: Negative for myalgias and falls.       Chronic LE swelling , joint pain Skin: Negative for itching and rash.  Neurological: Positive for sensory change and weakness,. Negative for tingling, tremors, speech change, focal weakness, seizures, loss of consciousness and headaches.  Endo/Heme/Allergies: Does not bruise/bleed easily.  Psychiatric/Behavioral: Negative for depression, suicidal ideas, memory loss and substance abuse. The patient is not nervous/anxious and does not have insomnia.   14 point review of systems was performed  and is negative except as detailed under history of present illness and above  PHYSICAL EXAMINATION  ECOG PERFORMANCE STATUS: 1 - Symptomatic but completely ambulatory  Filed Vitals:   12/23/14 1128  BP: 160/58  Pulse: 58  Temp: 97.8 F (36.6 C)  Resp: 18    Physical Exam  Constitutional: She is oriented to person, place, and time and well-developed, well-nourished, and in no distress. hair regrowth. Well groomed. Walks to the exam table but definitely limps favoring R hip. HENT:  Head: Normocephalic and atraumatic.  Nose: Nose normal.  Mouth/Throat: Oropharynx is clear and moist. No oropharyngeal exudate.  Eyes: Conjunctivae and EOM are normal. Pupils are equal, round, and reactive to light. Right eye exhibits no discharge. Left eye exhibits no discharge. No scleral icterus.  Neck: Normal range of motion. Neck supple. No tracheal deviation present. No thyromegaly present.  Cardiovascular: Normal rate, regular rhythm and normal heart sounds.  Exam reveals no gallop and no friction rub.  No murmur heard. Pulmonary/Chest: Effort normal and breath sounds normal. She has no wheezes. She has no rales.  Abdominal: Soft. Bowel sounds are normal. She exhibits no distension and no mass. There is no tenderness. There is no rebound and no guarding.  Musculoskeletal: Normal range of motion. She exhibits no edema. R leg with external rotation during gait Left lower extremity ankle swelling is chronic  Lymphadenopathy:    She has no cervical adenopathy.  Neurological: She is alert and oriented to person, place, and time. She has normal reflexes. No cranial nerve deficit. Coordination normal.  Skin: Skin is warm and dry. No rash noted. Small area of "irritation" on LLE. Does not appear suspicious Psychiatric: Mood, memory, affect and judgment normal.  Chest: Bilateral mastectomy sites are well healed without erythema or palpable abnormalities. Nursing note and vitals reviewed.  RADIOLOGY: I  have reviewed the studies listed below:  EXAM: DUAL X-RAY ABSORPTIOMETRY (DXA) FOR BONE MINERAL DENSITY  IMPRESSION: Ordering Physician: Dr. Patrici Ranks,  Your patient Tiffany Sweeney completed a BMD test on 08/26/2014 using the [place] (software version: 14.10) manufactured by [employer]. The following summarizes the results of our evaluation. PATIENT BIOGRAPHICAL: Name: DELENN, AHN Patient ID: [id number] Birth Date: 1934-08-15 Height: 64.0 in. Gender: Female Exam Date: 08/26/2014 Weight: 158.0 lbs. Indications: Bilateral Oophrectomy, Caucasian, Hx Breast Ca, Post Menopausal Fractures: Treatments: Calcium, Vitamin D DENSITOMETRY RESULTS: Site Region Measured Date Measured Age WHO Classification Young Adult T-score BMD %Change vs. Previous Significant Change (*) [facility] Spine L1-L3 08/26/2014 78.2 Normal 0.6 1.238 g/cm2  DualFemur Neck Left 08/26/2014 78.2 Osteopenia -1.6 0.813 g/cm2 ASSESSMENT: BMD as determined from Femur Neck Left is 0.813 g/cm2 with a T-Score of -1.6. This patient is considered osteopenic according to [place] [facility]) criteria. Per official position of the ISCD, it is not possible to quantitatively compare BMD or calculate a LSC between different facilities. (L-4 was excluded due to advanced degenerative changes.)  World Health Organization [facility]) criteria for post-menopausal, Caucasian Women: Normal: T-score at or above -1 SD Osteopenia: T-score between -1 and -2.5 SD Osteoporosis: T-score at or below -2.5 SD  RECOMMENDATIONS: [place] recommends that FDA-approved medial therapies be considered in postmenopausal women and men age 64 or older with a: 1. Hip or vertberal (clinical or morphometric) fracture. 2. T-Score of < -2.5 at the spine or hip. 3. Ten-year fracture probability by FRAX of 3% or greater for hip fracture or 20% or greater for  major osteoporotic fracture.  All treatment decisions require clinical judgment and consideration of individual patient factors, including patient preferences, co-morbidities, previous drug use, risk factors not captured in the FRAX model (e.g. falls, vitamin D deficiency, increased bone turnover, interval significant decline in bone density) and possible under-or over-estimation of fracture risk by FRAX.  All patients should ensure an adequate intake of dietary calcium (1200 mg/d) and vitamin D (800 IU daily) unless contraindicated.  FOLLOW-UP: People with diagnosed cases of osteoporosis or osteopenia should be regularly tested for bone mineral density. For patients eligible for Medicare, routine testing is allowed once every 2 years. Testing frequency can be increased for patients who have rapidly progressing disease, or for those who are receiving medical therapy to restore bone mass.  I have reviewed this report, and agree with the above findings.  [facility] Your patient Tiffany Sweeney completed a FRAX assessment on 08/26/2014 using the [place] (analysis version: 14.10) manufactured by [employer]. The following  summarizes the results of our evaluation.  PATIENT BIOGRAPHICAL: Name: Tiffany Sweeney, Tiffany Sweeney Patient ID: [id number] Birth Date: [birth year] Height: 64.0 in. Gender: Female [age] Weight: 158.0 lbs. Ethnicity: White Exam Date: 08/26/2014  FRAX* RESULTS: (version: 3.5) 10-year Probability of Fracture1 Major Osteoporotic Fracture2 Hip Fracture 13.3% 3.2% Population: [place] (Caucasian) Risk Factors: None  Based on Femur (Left) Neck BMD  1 -The 10-year probability of fracture may be lower than reported if the patient has received treatment. 2 -Major Osteoporotic Fracture: Clinical Spine, Forearm, Hip or Shoulder  *FRAX is a Materials engineer of the [employer] of  [employer] for Metabolic Bone Disease, a [place] (WHO) [facility].  ASSESSMENT: The probability of a major osteoporotic fracture is 13.3% within the next ten years.  The probability of a hip fracture is 3.2% within the next ten years.   Electronically Signed  By: David Martinique M.D.  On: 08/26/2014 16:21   LABORATORY DATA: I have reviewed the data below as listed.  CBC    Component Value Date/Time   WBC 5.6 12/23/2014 1228   WBC 6.7 10/10/2013 1010   WBC 7.9 07/06/2006 1141   RBC 4.47 12/23/2014 1228   RBC 4.70 10/10/2013 1010   RBC 4.45 07/06/2006 1141   HGB 13.3 12/23/2014 1228   HGB 14.0 10/10/2013 1010   HGB 13.0 07/06/2006 1141   HCT 39.9 12/23/2014 1228   HCT 41.8 10/10/2013 1010   HCT 38.2 07/06/2006 1141   PLT 208 12/23/2014 1228   PLT 220 10/10/2013 1010   PLT 272 07/06/2006 1141   MCV 89.3 12/23/2014 1228   MCV 89 10/10/2013 1010   MCV 85.8 07/06/2006 1141   MCH 29.8 12/23/2014 1228   MCH 29.8 10/10/2013 1010   MCH 29.3 07/06/2006 1141   MCHC 33.3 12/23/2014 1228   MCHC 33.5 10/10/2013 1010   MCHC 34.1 07/06/2006 1141   RDW 13.2 12/23/2014 1228   RDW 13.3 10/10/2013 1010   RDW 11.2* 07/06/2006 1141   LYMPHSABS 1.5 12/23/2014 1228   LYMPHSABS 2.0 10/10/2013 1010   LYMPHSABS 2.1 07/06/2006 1141   MONOABS 0.5 12/23/2014 1228   MONOABS 0.6 07/06/2006 1141   EOSABS 0.1 12/23/2014 1228   EOSABS 0.1 10/10/2013 1010   EOSABS 0.1 07/06/2006 1141   BASOSABS 0.0 12/23/2014 1228   BASOSABS 0.0 10/10/2013 1010   BASOSABS 0.1 07/06/2006 1141   CMP     Component Value Date/Time   NA 140 12/23/2014 1228   K 3.9 12/23/2014 1228   CL 105 12/23/2014 1228   CO2 28 12/23/2014 1228   GLUCOSE 83 12/23/2014 1228   BUN 19 12/23/2014 1228   CREATININE 0.79 12/23/2014 1228   CALCIUM 9.3 12/23/2014 1228   PROT 6.5 12/23/2014 1228   ALBUMIN 3.8 12/23/2014 1228   AST 17 12/23/2014 1228   ALT 12* 12/23/2014  1228   ALKPHOS 67 12/23/2014 1228   BILITOT 0.6 12/23/2014 1228   GFRNONAA >60 12/23/2014 1228   GFRAA >60 12/23/2014 1228     ASSESSMENT and THERAPY PLAN:   Stage I triple negative carcinoma of the left breast Mastectomy 3 cycles of Taxotere and Cytoxan, discontinued prior to cycle 4 secondary to excessive fatigue, hand/foot syndrome and worsening neuropathy History of R breast cancer Family history of ovarian cancer Osteopenia  She has a history of bilateral breast cancers. Currently without obvious recurrence.  She is finally considering a hip replacement. Her hip is certainly limiting her ADL's and making things difficult for her.  In  regards to her neck and shoulder pain I have asked her to get XRAYS on her departure from the clinic today.  We will call her with results and the need for any additional imaging if required.  We have refilled her EMLA, ativan.  Return in 3 months with ongoing observation.   All questions were answered. The patient knows to call the clinic with any problems, questions or concerns. We can certainly see the patient much sooner if necessary.   This note was electronically signed.   Kelby Fam. Whitney Muse, MD

## 2014-12-23 ENCOUNTER — Ambulatory Visit (HOSPITAL_COMMUNITY)
Admission: RE | Admit: 2014-12-23 | Discharge: 2014-12-23 | Disposition: A | Discharge status: Home / Self Care | Payer: Medicare Other | Source: Ambulatory Visit | Attending: Hematology & Oncology | Admitting: Hematology & Oncology

## 2014-12-23 ENCOUNTER — Encounter (HOSPITAL_BASED_OUTPATIENT_CLINIC_OR_DEPARTMENT_OTHER): Payer: Medicare Other

## 2014-12-23 ENCOUNTER — Encounter (HOSPITAL_COMMUNITY): Payer: Self-pay | Admitting: Hematology & Oncology

## 2014-12-23 ENCOUNTER — Encounter (HOSPITAL_COMMUNITY): Payer: Medicare Other | Attending: Hematology and Oncology | Admitting: Hematology & Oncology

## 2014-12-23 ENCOUNTER — Other Ambulatory Visit (HOSPITAL_COMMUNITY): Payer: Self-pay | Admitting: Hematology & Oncology

## 2014-12-23 VITALS — BP 160/58 | HR 58 | Temp 97.8°F | Resp 18 | Wt 164.8 lb

## 2014-12-23 DIAGNOSIS — M25511 Pain in right shoulder: Secondary | ICD-10-CM | POA: Diagnosis not present

## 2014-12-23 DIAGNOSIS — E559 Vitamin D deficiency, unspecified: Secondary | ICD-10-CM

## 2014-12-23 DIAGNOSIS — M542 Cervicalgia: Secondary | ICD-10-CM

## 2014-12-23 DIAGNOSIS — M810 Age-related osteoporosis without current pathological fracture: Secondary | ICD-10-CM | POA: Diagnosis not present

## 2014-12-23 DIAGNOSIS — Z170 Estrogen receptor positive status [ER+]: Secondary | ICD-10-CM | POA: Insufficient documentation

## 2014-12-23 DIAGNOSIS — G8929 Other chronic pain: Secondary | ICD-10-CM

## 2014-12-23 DIAGNOSIS — C50912 Malignant neoplasm of unspecified site of left female breast: Secondary | ICD-10-CM | POA: Diagnosis not present

## 2014-12-23 DIAGNOSIS — C50911 Malignant neoplasm of unspecified site of right female breast: Secondary | ICD-10-CM

## 2014-12-23 DIAGNOSIS — I10 Essential (primary) hypertension: Secondary | ICD-10-CM | POA: Insufficient documentation

## 2014-12-23 DIAGNOSIS — Z95828 Presence of other vascular implants and grafts: Secondary | ICD-10-CM

## 2014-12-23 DIAGNOSIS — Z853 Personal history of malignant neoplasm of breast: Secondary | ICD-10-CM | POA: Diagnosis not present

## 2014-12-23 DIAGNOSIS — R937 Abnormal findings on diagnostic imaging of other parts of musculoskeletal system: Secondary | ICD-10-CM | POA: Diagnosis not present

## 2014-12-23 DIAGNOSIS — M50323 Other cervical disc degeneration at C6-C7 level: Secondary | ICD-10-CM | POA: Insufficient documentation

## 2014-12-23 DIAGNOSIS — C50412 Malignant neoplasm of upper-outer quadrant of left female breast: Secondary | ICD-10-CM

## 2014-12-23 DIAGNOSIS — R9389 Abnormal findings on diagnostic imaging of other specified body structures: Secondary | ICD-10-CM

## 2014-12-23 LAB — COMPREHENSIVE METABOLIC PANEL
ALBUMIN: 3.8 g/dL (ref 3.5–5.0)
ALK PHOS: 67 U/L (ref 38–126)
ALT: 12 U/L — ABNORMAL LOW (ref 14–54)
ANION GAP: 7 (ref 5–15)
AST: 17 U/L (ref 15–41)
BUN: 19 mg/dL (ref 6–20)
CALCIUM: 9.3 mg/dL (ref 8.9–10.3)
CO2: 28 mmol/L (ref 22–32)
Chloride: 105 mmol/L (ref 101–111)
Creatinine, Ser: 0.79 mg/dL (ref 0.44–1.00)
GFR calc Af Amer: >60 mL/min (ref >60)
GFR calc non Af Amer: >60 mL/min (ref >60)
GLUCOSE: 83 mg/dL (ref 65–99)
Potassium: 3.9 mmol/L (ref 3.5–5.1)
SODIUM: 140 mmol/L (ref 135–145)
Total Bilirubin: 0.6 mg/dL (ref 0.3–1.2)
Total Protein: 6.5 g/dL (ref 6.5–8.1)

## 2014-12-23 LAB — CBC WITH DIFFERENTIAL/PLATELET
BASOS ABS: 0 10*3/uL (ref 0.0–0.1)
BASOS PCT: 1 %
EOS ABS: 0.1 10*3/uL (ref 0.0–0.7)
Eosinophils Relative: 3 %
HCT: 39.9 % (ref 36.0–46.0)
HEMOGLOBIN: 13.3 g/dL (ref 12.0–15.0)
Lymphocytes Relative: 26 %
Lymphs Abs: 1.5 10*3/uL (ref 0.7–4.0)
MCH: 29.8 pg (ref 26.0–34.0)
MCHC: 33.3 g/dL (ref 30.0–36.0)
MCV: 89.3 fL (ref 78.0–100.0)
Monocytes Absolute: 0.5 10*3/uL (ref 0.1–1.0)
Monocytes Relative: 9 %
NEUTROS PCT: 61 %
Neutro Abs: 3.4 10*3/uL (ref 1.7–7.7)
Platelets: 208 10*3/uL (ref 150–400)
RBC: 4.47 MIL/uL (ref 3.87–5.11)
RDW: 13.2 % (ref 11.5–15.5)
WBC: 5.6 10*3/uL (ref 4.0–10.5)

## 2014-12-23 MED ORDER — HEPARIN SOD (PORK) LOCK FLUSH 100 UNIT/ML IV SOLN
500.0000 [IU] | Freq: Once | INTRAVENOUS | Status: AC
  Administered 2014-12-23: 500 [IU] via INTRAVENOUS

## 2014-12-23 MED ORDER — LIDOCAINE-PRILOCAINE 2.5-2.5 % EX CREA: TOPICAL_CREAM | CUTANEOUS | Status: DC

## 2014-12-23 MED ORDER — HEPARIN SOD (PORK) LOCK FLUSH 100 UNIT/ML IV SOLN
INTRAVENOUS | Status: AC
  Filled 2014-12-23: qty 5

## 2014-12-23 MED ORDER — LORAZEPAM 0.5 MG PO TABS: 0.5000 mg | ORAL_TABLET | ORAL | Status: DC | PRN

## 2014-12-23 MED ORDER — SODIUM CHLORIDE 0.9 % IJ SOLN
10.0000 mL | INTRAMUSCULAR | Status: DC | PRN
  Administered 2014-12-23: 10 mL via INTRAVENOUS
  Filled 2014-12-23: qty 10

## 2014-12-23 NOTE — Progress Notes (Signed)
..  Tiffany Sweeney presented for Portacath access and flush.   Portacath located rt chest wall accessed with  H 20 needle.  Good blood return present. Portacath flushed with 17ml NS and 500U/10ml Heparin and needle removed intact.  Procedure tolerated well and without incident.

## 2014-12-23 NOTE — Patient Instructions (Addendum)
[place] at [facility] Discharge Instructions  RECOMMENDATIONS MADE BY THE CONSULTANT AND ANY TEST RESULTS WILL BE SENT TO YOUR REFERRING PHYSICIAN.   Exam completed by Dr Whitney Muse today X-ray today of your shoulder and neck Just stop by radiology when you leave [place] cream sent to pharmacy  Ativan prescription given You can apply neosporin (you can get this over the counter) to spot on back of left leg for 4-6 weeks,  please let us know if it is not any better we can refer you to dermatologist Return to see the doctor in 3 months Port flush today with lab work Port flush every 8 weeks When you return we will do a clinical breast exam  Please call the clinic if you have any questions or concerns.    Thank you for choosing [place] at [facility] to provide your oncology and hematology care.  To afford each patient quality time with our provider, please arrive at least 15 minutes before your scheduled appointment time.    You need to re-schedule your appointment should you arrive 10 or more minutes late.  We strive to give you quality time with our providers, and arriving late affects you and other patients whose appointments are after yours.  Also, if you no show three or more times for appointments you may be dismissed from the clinic at the providers discretion.     Again, thank you for choosing [facility].  Our hope is that these requests will decrease the amount of time that you wait before being seen by our physicians.       _____________________________________________________________  Should you have questions after your visit to [facility], please contact our office at [phone] between the hours of 8:30 a.m. and 4:30 p.m.  Voicemails left after 4:30 p.m. will not be returned until the following business day.  For prescription refill requests, have your pharmacy contact our office.

## 2014-12-24 LAB — VITAMIN D 25 HYDROXY (VIT D DEFICIENCY, FRACTURES): Vit D, 25-Hydroxy: 33.2 ng/mL (ref 30.0–100.0)

## 2014-12-25 ENCOUNTER — Other Ambulatory Visit (HOSPITAL_COMMUNITY): Payer: Self-pay | Admitting: Hematology & Oncology

## 2014-12-31 ENCOUNTER — Other Ambulatory Visit (HOSPITAL_COMMUNITY): Payer: Self-pay | Admitting: Hematology & Oncology

## 2015-01-08 ENCOUNTER — Encounter (HOSPITAL_COMMUNITY): Payer: Medicare Other | Attending: Hematology and Oncology

## 2015-01-08 ENCOUNTER — Encounter (HOSPITAL_COMMUNITY): Payer: Self-pay

## 2015-01-08 ENCOUNTER — Encounter (HOSPITAL_COMMUNITY)
Admission: RE | Admit: 2015-01-08 | Discharge: 2015-01-08 | Disposition: A | Discharge status: Home / Self Care | Payer: Medicare Other | Source: Ambulatory Visit | Attending: Hematology & Oncology | Admitting: Hematology & Oncology

## 2015-01-08 DIAGNOSIS — M25511 Pain in right shoulder: Secondary | ICD-10-CM | POA: Diagnosis present

## 2015-01-08 DIAGNOSIS — R938 Abnormal findings on diagnostic imaging of other specified body structures: Secondary | ICD-10-CM | POA: Insufficient documentation

## 2015-01-08 DIAGNOSIS — Z170 Estrogen receptor positive status [ER+]: Secondary | ICD-10-CM | POA: Insufficient documentation

## 2015-01-08 DIAGNOSIS — C50412 Malignant neoplasm of upper-outer quadrant of left female breast: Secondary | ICD-10-CM

## 2015-01-08 DIAGNOSIS — Z452 Encounter for adjustment and management of vascular access device: Secondary | ICD-10-CM | POA: Diagnosis present

## 2015-01-08 DIAGNOSIS — I10 Essential (primary) hypertension: Secondary | ICD-10-CM | POA: Insufficient documentation

## 2015-01-08 DIAGNOSIS — C50911 Malignant neoplasm of unspecified site of right female breast: Secondary | ICD-10-CM | POA: Insufficient documentation

## 2015-01-08 DIAGNOSIS — R9389 Abnormal findings on diagnostic imaging of other specified body structures: Secondary | ICD-10-CM

## 2015-01-08 DIAGNOSIS — C50912 Malignant neoplasm of unspecified site of left female breast: Secondary | ICD-10-CM | POA: Insufficient documentation

## 2015-01-08 DIAGNOSIS — M810 Age-related osteoporosis without current pathological fracture: Secondary | ICD-10-CM | POA: Insufficient documentation

## 2015-01-08 MED ORDER — HEPARIN SOD (PORK) LOCK FLUSH 100 UNIT/ML IV SOLN
500.0000 [IU] | Freq: Once | INTRAVENOUS | Status: AC
  Administered 2015-01-08: 500 [IU] via INTRAVENOUS

## 2015-01-08 MED ORDER — TECHNETIUM TC 99M MEDRONATE IV KIT
25.0000 | PACK | Freq: Once | INTRAVENOUS | Status: AC | PRN
  Administered 2015-01-08: 25 via INTRAVENOUS

## 2015-01-08 MED ORDER — SODIUM CHLORIDE 0.9 % IJ SOLN
10.0000 mL | Freq: Once | INTRAMUSCULAR | Status: AC
  Administered 2015-01-08: 10 mL via INTRAVENOUS

## 2015-01-08 NOTE — Progress Notes (Signed)
Tiffany Sweeney presented for Portacath access and flush. Proper placement of portacath confirmed by CXR. Portacath located rt chest wall accessed with  H 20 needle. Good blood return present. Portacath flushed with 20ml NS and 500U/5ml Heparin and needle removed intact. Procedure without incident. Patient tolerated procedure well.   

## 2015-02-23 ENCOUNTER — Encounter (HOSPITAL_COMMUNITY): Payer: Self-pay

## 2015-02-23 ENCOUNTER — Encounter (HOSPITAL_COMMUNITY): Payer: Medicare Other | Attending: Hematology and Oncology

## 2015-02-23 VITALS — BP 147/73 | HR 64 | Temp 98.1°F | Resp 20

## 2015-02-23 DIAGNOSIS — Z95828 Presence of other vascular implants and grafts: Secondary | ICD-10-CM

## 2015-02-23 DIAGNOSIS — M810 Age-related osteoporosis without current pathological fracture: Secondary | ICD-10-CM | POA: Insufficient documentation

## 2015-02-23 DIAGNOSIS — Z170 Estrogen receptor positive status [ER+]: Secondary | ICD-10-CM | POA: Insufficient documentation

## 2015-02-23 DIAGNOSIS — C50911 Malignant neoplasm of unspecified site of right female breast: Secondary | ICD-10-CM | POA: Insufficient documentation

## 2015-02-23 DIAGNOSIS — C50412 Malignant neoplasm of upper-outer quadrant of left female breast: Secondary | ICD-10-CM

## 2015-02-23 DIAGNOSIS — Z452 Encounter for adjustment and management of vascular access device: Secondary | ICD-10-CM

## 2015-02-23 DIAGNOSIS — I10 Essential (primary) hypertension: Secondary | ICD-10-CM | POA: Insufficient documentation

## 2015-02-23 DIAGNOSIS — C50912 Malignant neoplasm of unspecified site of left female breast: Secondary | ICD-10-CM | POA: Insufficient documentation

## 2015-02-23 MED ORDER — SODIUM CHLORIDE 0.9% FLUSH
10.0000 mL | INTRAVENOUS | Status: DC | PRN
  Administered 2015-02-23: 10 mL via INTRAVENOUS
  Filled 2015-02-23: qty 10

## 2015-02-23 MED ORDER — HEPARIN SOD (PORK) LOCK FLUSH 100 UNIT/ML IV SOLN
INTRAVENOUS | Status: AC
  Filled 2015-02-23: qty 5

## 2015-02-23 MED ORDER — HEPARIN SOD (PORK) LOCK FLUSH 100 UNIT/ML IV SOLN
500.0000 [IU] | Freq: Once | INTRAVENOUS | Status: AC
  Administered 2015-02-23: 500 [IU] via INTRAVENOUS

## 2015-02-23 NOTE — Progress Notes (Signed)
Harriet Masson presented for Portacath access and flush. Portacath located right chest wall accessed with  H 20 needle. Good blood return present. Portacath flushed with 23ml NS and 500U/59ml Heparin and needle removed intact. Procedure without incident. Patient tolerated procedure well.

## 2015-02-23 NOTE — Patient Instructions (Signed)
[place] at [facility] Discharge Instructions  RECOMMENDATIONS MADE BY THE CONSULTANT AND ANY TEST RESULTS WILL BE SENT TO YOUR REFERRING PHYSICIAN.  You got your port flushed today. Follow up as scheduled.  Thank you for choosing [place] at [facility] to provide your oncology and hematology care.  To afford each patient quality time with our provider, please arrive at least 15 minutes before your scheduled appointment time.   Beginning January 23rd 2017 lab work for the [facility] will be done in the  Main lab at [facility] on 1st floor. If you have a lab appointment with the [facility] please come in thru the  Main Entrance and check in at the main information desk  You need to re-schedule your appointment should you arrive 10 or more minutes late.  We strive to give you quality time with our providers, and arriving late affects you and other patients whose appointments are after yours.  Also, if you no show three or more times for appointments you may be dismissed from the clinic at the providers discretion.     Again, thank you for choosing [facility].  Our hope is that these requests will decrease the amount of time that you wait before being seen by our physicians.       _____________________________________________________________  Should you have questions after your visit to [facility], please contact our office at [phone] between the hours of 8:30 a.m. and 4:30 p.m.  Voicemails left after 4:30 p.m. will not be returned until the following business day.  For prescription refill requests, have your pharmacy contact our office.

## 2015-03-23 ENCOUNTER — Encounter (HOSPITAL_COMMUNITY): Payer: Medicare Other | Attending: Hematology and Oncology | Admitting: Hematology & Oncology

## 2015-03-23 ENCOUNTER — Encounter (HOSPITAL_BASED_OUTPATIENT_CLINIC_OR_DEPARTMENT_OTHER): Payer: Medicare Other

## 2015-03-23 ENCOUNTER — Encounter (HOSPITAL_COMMUNITY): Payer: Self-pay | Admitting: Hematology & Oncology

## 2015-03-23 VITALS — BP 149/78 | HR 62 | Temp 98.2°F | Resp 18 | Wt 164.0 lb

## 2015-03-23 DIAGNOSIS — C50412 Malignant neoplasm of upper-outer quadrant of left female breast: Secondary | ICD-10-CM

## 2015-03-23 DIAGNOSIS — M810 Age-related osteoporosis without current pathological fracture: Secondary | ICD-10-CM | POA: Diagnosis not present

## 2015-03-23 DIAGNOSIS — Z170 Estrogen receptor positive status [ER+]: Secondary | ICD-10-CM | POA: Diagnosis not present

## 2015-03-23 DIAGNOSIS — E559 Vitamin D deficiency, unspecified: Secondary | ICD-10-CM

## 2015-03-23 DIAGNOSIS — Z803 Family history of malignant neoplasm of breast: Secondary | ICD-10-CM | POA: Diagnosis not present

## 2015-03-23 DIAGNOSIS — I10 Essential (primary) hypertension: Secondary | ICD-10-CM | POA: Diagnosis not present

## 2015-03-23 DIAGNOSIS — G629 Polyneuropathy, unspecified: Secondary | ICD-10-CM | POA: Diagnosis not present

## 2015-03-23 DIAGNOSIS — M25559 Pain in unspecified hip: Secondary | ICD-10-CM | POA: Diagnosis not present

## 2015-03-23 DIAGNOSIS — M542 Cervicalgia: Secondary | ICD-10-CM

## 2015-03-23 DIAGNOSIS — M8580 Other specified disorders of bone density and structure, unspecified site: Secondary | ICD-10-CM

## 2015-03-23 DIAGNOSIS — C50912 Malignant neoplasm of unspecified site of left female breast: Secondary | ICD-10-CM

## 2015-03-23 DIAGNOSIS — C50911 Malignant neoplasm of unspecified site of right female breast: Secondary | ICD-10-CM | POA: Insufficient documentation

## 2015-03-23 DIAGNOSIS — Z853 Personal history of malignant neoplasm of breast: Secondary | ICD-10-CM | POA: Diagnosis not present

## 2015-03-23 DIAGNOSIS — Z95828 Presence of other vascular implants and grafts: Secondary | ICD-10-CM

## 2015-03-23 LAB — COMPREHENSIVE METABOLIC PANEL
ALT: 15 U/L (ref 14–54)
AST: 20 U/L (ref 15–41)
Albumin: 3.8 g/dL (ref 3.5–5.0)
Alkaline Phosphatase: 66 U/L (ref 38–126)
Anion gap: 8 (ref 5–15)
BUN: 19 mg/dL (ref 6–20)
CHLORIDE: 107 mmol/L (ref 101–111)
CO2: 29 mmol/L (ref 22–32)
Calcium: 9.1 mg/dL (ref 8.9–10.3)
Creatinine, Ser: 0.74 mg/dL (ref 0.44–1.00)
Glucose, Bld: 114 mg/dL — ABNORMAL HIGH (ref 65–99)
POTASSIUM: 3.7 mmol/L (ref 3.5–5.1)
Sodium: 144 mmol/L (ref 135–145)
Total Bilirubin: 0.6 mg/dL (ref 0.3–1.2)
Total Protein: 6.6 g/dL (ref 6.5–8.1)

## 2015-03-23 LAB — CBC WITH DIFFERENTIAL/PLATELET
BASOS ABS: 0 10*3/uL (ref 0.0–0.1)
Basophils Relative: 1 %
EOS ABS: 0.1 10*3/uL (ref 0.0–0.7)
EOS PCT: 3 %
HCT: 40.5 % (ref 36.0–46.0)
Hemoglobin: 13.4 g/dL (ref 12.0–15.0)
LYMPHS PCT: 23 %
Lymphs Abs: 1.2 10*3/uL (ref 0.7–4.0)
MCH: 29.4 pg (ref 26.0–34.0)
MCHC: 33.1 g/dL (ref 30.0–36.0)
MCV: 88.8 fL (ref 78.0–100.0)
MONO ABS: 0.4 10*3/uL (ref 0.1–1.0)
Monocytes Relative: 9 %
Neutro Abs: 3.3 10*3/uL (ref 1.7–7.7)
Neutrophils Relative %: 64 %
PLATELETS: 204 10*3/uL (ref 150–400)
RBC: 4.56 MIL/uL (ref 3.87–5.11)
RDW: 13.3 % (ref 11.5–15.5)
WBC: 5 10*3/uL (ref 4.0–10.5)

## 2015-03-23 MED ORDER — SODIUM CHLORIDE 0.9% FLUSH
10.0000 mL | Freq: Once | INTRAVENOUS | Status: AC
  Administered 2015-03-23: 10 mL via INTRAVENOUS

## 2015-03-23 MED ORDER — HEPARIN SOD (PORK) LOCK FLUSH 100 UNIT/ML IV SOLN
500.0000 [IU] | Freq: Once | INTRAVENOUS | Status: AC
  Administered 2015-03-23: 500 [IU] via INTRAVENOUS

## 2015-03-23 NOTE — Progress Notes (Signed)
Tiffany Bellow, MD [address] 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 History of Carcinoma of Tiffany R breast  CURRENT THERAPY: Observation  INTERVAL HISTORY: Tiffany Sweeney 81 y.o. female returns for follow-up of Tiffany Sweeney breast cancer. Tiffany Sweeney is here alone.   Continuing to experience hip pain. Tiffany Sweeney plans on having Tiffany Sweeney hip replaced, however Tiffany surgeon Tiffany Sweeney would like to go to is four months behind schedule. Tiffany Sweeney is hoping to have Tiffany surgery earlier if someone else cancels.   Reports some right neck pain, attributing this to propping up Tiffany Sweeney leg while sleeping.   Tiffany Sweeney was given an "antibiotic" for a yeast infection. Tiffany Sweeney finished Tiffany antibiotic 4 days ago. Reports there is still some mild burning. Tiffany Sweeney purchased an anti-itching cream that is not helping. Tiffany Sweeney will follow up with Tiffany Sweeney PCP if this does not resolve.  Since our last visit, Tiffany Sweeney had a lesion of skin cancer removed from Tiffany Sweeney right hand. Tiffany Sweeney will follow up with Tiffany Sweeney dermatologist tomorrow.   Tiffany Sweeney has been eating and sleeping well. Without Tiffany hip pain and more energy, Tiffany Sweeney life would be good. Tiffany Sweeney denies issues with Tiffany Sweeney port. Denies any problems with Tiffany Sweeney chest wall.  MEDICAL HISTORY: Past Medical History  Diagnosis Date  . High cholesterol   . Hypertension   . Anxiety   . Cancer Tiffany Sweeney)     breast- right  . Arthritis   . Breast cancer ([place])   . Family history of ovarian cancer   . Family history of colon cancer   . Family history of pancreatic cancer   . Skin cancer     Sweeney stated  Tiffany Sweeney has low grade skin cancer in right hand    has Low back pain; Breast cancer ([place]); Family history of ovarian cancer; Family history of colon cancer; Family history of pancreatic cancer; and Genetic testing on Tiffany Sweeney problem list.      Breast cancer ([place])   07/25/2013 Imaging Plain film of Tiffany lumbar spine, complete 4 view. Chronic lumbar  degenerative change most prominent at L4-5. No acute abnormality and no change from prior study   10/21/2013 Initial Diagnosis Left breast cancer, core biopsy, ER negative, PR negative, Tiffany Sweeney-2/neu negative, Ki-67 of 13%, infiltrating duct cell   11/04/2013 Surgery Left modified radical mastectomy, 1.8 cm infiltrating duct cell carcinoma, ER negative, PR negative, Tiffany Sweeney-2/neu not overexpressed, 9 lymph nodes negative. Stage TI cN0 M0.   12/06/2013 Imaging Portable chest x-ray showing no pneumothorax no lung edema or consolidation Port-A-Cath tip in Tiffany SVC.   12/23/2013 - 02/03/2014 Chemotherapy Cycle 1 of TC initiated on 12/23/2013, Sweeney completed 3 cycles of prescirbed therapy     has No Known Allergies.  Tiffany Sweeney had no medications administered during this visit.  SURGICAL HISTORY: Past Surgical History  Procedure Laterality Date  . Abdominal hysterectomy    . Mastectomy Right   . Cataract extraction w/phaco Left 03/11/2013    Procedure: CATARACT EXTRACTION PHACO AND INTRAOCULAR LENS PLACEMENT (IOC);  Surgeon: Tonny Branch, MD;  Location: [facility];  Service: Ophthalmology;  Laterality: Left;  CDE:7.54  . Cataract extraction w/phaco Right 04/15/2013    Procedure: CATARACT EXTRACTION PHACO AND INTRAOCULAR LENS PLACEMENT (IOC);  Surgeon: Tonny Branch, MD;  Location: [facility];  Service: Ophthalmology;  Laterality: Right;  CDE 9.38  . Mastectomy modified radical Left 11/04/2013    Procedure: LEFT MASTECTOMY MODIFIED RADICAL;  Surgeon: Jeannette How  Arnoldo Morale, MD;  Location: [facility];  Service: General;  Laterality: Left;  . Portacath placement Right 12/06/2013    Procedure: INSERTION PORT-A-CATH-Right subclavian;  Surgeon: Jamesetta So, MD;  Location: [facility];  Service: General;  Laterality: Right;    SOCIAL HISTORY: Social History   Social History  . Marital Status: Widowed    Spouse Name: N/A  . Number of Children: 3  . Years of Education: N/A   Occupational History  . Not on file.   Social History Main  Topics  . Smoking status: Former Smoker -- 0.25 packs/day for 30 years    Types: Cigarettes    Start date: 10/10/1956    Quit date: 03/08/1985  . Smokeless tobacco: Never Used     Comment: quit 25 years ago  . Alcohol Use: No  . Drug Use: No  . Sexual Activity: Yes    Birth Control/ Protection: Post-menopausal   Other Topics Concern  . Not on file   Social History Narrative    FAMILY HISTORY: Family History  Problem Relation Age of Onset  . Diabetes Mother   . Rectal cancer Mother 51    died from complications of DM  . Dementia Father     Died at 84 in an accident  . Pancreatic cancer Brother 82  . Lung cancer Sister 70    "small oat cell"  . Ovarian cancer Sister 17  . Cancer Cousin     maternal 1st cousin with either ovarian or uterine  . Colon cancer Cousin     paternal first cousin dx at unknown age    Review of Systems  Constitutional: Positive for malaise/fatigue. Negative for fever, chills and weight loss.  HENT: Negative for congestion, hearing loss, nosebleeds, sore throat and tinnitus.   Eyes: Negative for blurred vision, double vision, pain and discharge.  Respiratory: Negative for cough, hemoptysis, sputum production, shortness of breath and wheezing.   Cardiovascular: Negative for chest pain, palpitations, claudication, leg swelling and PND.  Gastrointestinal: Negative for heartburn, nausea, vomiting, abdominal pain, diarrhea, constipation, blood in stool and melena.  Genitourinary: Negative for dysuria, urgency, frequency and hematuria.  Musculoskeletal: Positive for joint pain. Negative for myalgias and falls.       Chronic LE swelling , joint pain, hip pain Skin: Negative for itching and rash.  Neurological: Positive for sensory change and weakness,. Negative for tingling, tremors, speech change, focal weakness, seizures, loss of consciousness and headaches.  Endo/Heme/Allergies: Does not bruise/bleed easily.  Psychiatric/Behavioral: Negative for  depression, suicidal ideas, memory loss and substance abuse. Tiffany Sweeney is not nervous/anxious and does not have insomnia.   14 point review of systems was performed and is negative except as detailed under history of present illness and above  PHYSICAL EXAMINATION  ECOG PERFORMANCE STATUS: 1 - Symptomatic but completely ambulatory  Filed Vitals:   03/23/15 1000  BP: 149/78  Pulse: 62  Temp: 98.2 F (36.8 C)  Resp: 18    Physical Exam  Constitutional: Tiffany Sweeney is oriented to person, place, and time and well-developed, well-nourished, and in no distress. hair regrowth. Well groomed. Walks to Tiffany exam table but definitely limps favoring R hip. Able to get on exam table with assistance. HENT:  Head: Normocephalic and atraumatic.  Nose: Nose normal.  Mouth/Throat: Oropharynx is clear and moist. No oropharyngeal exudate.  Eyes: Conjunctivae and EOM are normal. Pupils are equal, round, and reactive to light. Right eye exhibits no discharge. Left eye exhibits no discharge. No scleral icterus.  Neck: Normal range of motion. Neck supple. No tracheal deviation present. No thyromegaly present.  Cardiovascular: Normal rate, regular rhythm and normal heart sounds.  Exam reveals no gallop and no friction rub.   No murmur heard. Pulmonary/Chest: Effort normal and breath sounds normal. Tiffany Sweeney has no wheezes. Tiffany Sweeney has no rales.  Abdominal: Soft. Bowel sounds are normal. Tiffany Sweeney exhibits no distension and no mass. There is no tenderness. There is no rebound and no guarding.  Musculoskeletal: Normal range of motion. Tiffany Sweeney exhibits no edema. R leg with external rotation during gait Left lower extremity ankle swelling is chronic  Lymphadenopathy:    Tiffany Sweeney has no cervical adenopathy.  Neurological: Tiffany Sweeney is alert and oriented to person, place, and time. Tiffany Sweeney has normal reflexes. No cranial nerve deficit. Coordination normal.  Skin: Skin is warm and dry. No rash noted. Small area of "irritation" on LLE. Does not appear  suspicious Psychiatric: Mood, memory, affect and judgment normal.  Chest: Bilateral mastectomy sites are well healed without erythema or palpable abnormalities. Nursing note and vitals reviewed.   LABORATORY DATA: I have reviewed Tiffany data below as listed. Results for MIRIAN, CASCO (MRN [id number])   Ref. Range 03/23/2015 10:50  Sodium Latest Ref Range: 135-145 mmol/L 144  Potassium Latest Ref Range: 3.5-5.1 mmol/L 3.7  Chloride Latest Ref Range: 101-111 mmol/L 107  CO2 Latest Ref Range: 22-32 mmol/L 29  BUN Latest Ref Range: 6-20 mg/dL 19  Creatinine Latest Ref Range: 0.44-1.00 mg/dL 0.74  Calcium Latest Ref Range: 8.9-10.3 mg/dL 9.1  EGFR (Non-African Amer.) Latest Ref Range: >60 mL/min >60  EGFR (African American) Latest Ref Range: >60 mL/min >60  Glucose Latest Ref Range: 65-99 mg/dL 114 (H)  Anion gap Latest Ref Range: 5-15  8  Alkaline Phosphatase Latest Ref Range: 38-126 U/L 66  Albumin Latest Ref Range: 3.5-5.0 g/dL 3.8  AST Latest Ref Range: 15-41 U/L 20  ALT Latest Ref Range: 14-54 U/L 15  Total Protein Latest Ref Range: 6.5-8.1 g/dL 6.6  Total Bilirubin Latest Ref Range: 0.3-1.2 mg/dL 0.6  WBC Latest Ref Range: 4.0-10.5 K/uL 5.0  RBC Latest Ref Range: 3.87-5.11 MIL/uL 4.56  Hemoglobin Latest Ref Range: 12.0-15.0 g/dL 13.4  HCT Latest Ref Range: 36.0-46.0 % 40.5  MCV Latest Ref Range: 78.0-100.0 fL 88.8  MCH Latest Ref Range: 26.0-34.0 pg 29.4  MCHC Latest Ref Range: 30.0-36.0 g/dL 33.1  RDW Latest Ref Range: 11.5-15.5 % 13.3  Platelets Latest Ref Range: 150-400 K/uL 204  Neutrophils Latest Units: % 64  Lymphocytes Latest Units: % 23  Monocytes Relative Latest Units: % 9  Eosinophil Latest Units: % 3  Basophil Latest Units: % 1  NEUT# Latest Ref Range: 1.7-7.7 K/uL 3.3  Lymphocyte # Latest Ref Range: 0.7-4.0 K/uL 1.2  Monocyte # Latest Ref Range: 0.1-1.0 K/uL 0.4  Eosinophils Absolute Latest Ref Range: 0.0-0.7 K/uL 0.1  Basophils Absolute Latest Ref Range:  0.0-0.1 K/uL 0.0     RADIOGRAPHIC STUDIES: I have personally reviewed Tiffany radiological images as listed and agreed with Tiffany findings in Tiffany report. Study Result     CLINICAL DATA: History of breast cancer in 1997 and in in 2015. Left mastectomy last year.  EXAM: NUCLEAR MEDICINE WHOLE BODY BONE SCAN  TECHNIQUE: Whole body anterior and posterior images were obtained approximately 3 hours after intravenous injection of radiopharmaceutical.  RADIOPHARMACEUTICALS: 27.0 mCi Technetium-8mMDP IV  COMPARISON: None.  FINDINGS: There are no foci of increased or decreased radiotracer uptake to suggest osseous metastatic disease.  Small focus of increased  radiotracer uptake in Tiffany right hindfoot likely degenerative.  Normal physiologic activity is identified within Tiffany kidneys and urinary bladder.  IMPRESSION: No scintigraphic evidence of osseous metastatic disease.   Electronically Signed  By: Kathreen Devoid  On: 01/08/2015 14:45    ASSESSMENT and THERAPY PLAN:   Stage I triple negative carcinoma of Tiffany left breast Mastectomy 3 cycles of Taxotere and Cytoxan, discontinued prior to cycle 4 secondary to excessive fatigue, hand/foot syndrome and worsening neuropathy History of R breast cancer Family history of ovarian cancer Osteopenia  Tiffany Sweeney has a history of bilateral breast cancers. Currently without obvious recurrence.  Tiffany Sweeney is finally considering a hip replacement. Tiffany Sweeney hip is certainly limiting Tiffany Sweeney ADL's and making things difficult for Tiffany Sweeney. Tiffany Sweeney is scheduled for a hip replacement in Tiffany next four months.  Tiffany Sweeney will return in 3 months for routine follow up. After Tiffany Sweeney next visit we will move Tiffany Sweeney out to 4 month intervals.   All questions were answered. Tiffany Sweeney knows to call Tiffany clinic with any problems, questions or concerns. We can certainly see Tiffany Sweeney much sooner if necessary.   This document serves as a record of services personally  performed by Ancil Linsey, MD. It was created on Tiffany Sweeney behalf by Arlyce Harman, a trained medical scribe. Tiffany creation of this record is based on Tiffany scribe's personal observations and Tiffany provider's statements to them. This document has been checked and approved by Tiffany attending provider.  I have reviewed Tiffany above documentation for accuracy and completeness, and I agree with Tiffany above.  This note was electronically signed.   Kelby Fam. Whitney Muse, MD

## 2015-03-23 NOTE — Patient Instructions (Addendum)
[place] at [facility] Discharge Instructions  RECOMMENDATIONS MADE BY THE CONSULTANT AND ANY TEST RESULTS WILL BE SENT TO YOUR REFERRING PHYSICIAN.    Exam and discussion by Dr Whitney Muse today Port flush with labs today, we will call you with those lab results If your yeast infection is not any better in a few days, please let your doctor know   Port flush every 6-8 weeks  Return to see the doctor in 3 months Please call the clinic if you have any questions or concerns      Thank you for choosing [place] at [facility] to provide your oncology and hematology care.  To afford each patient quality time with our provider, please arrive at least 15 minutes before your scheduled appointment time.   Beginning January 23rd 2017 lab work for the [facility] will be done in the  Main lab at [facility] on 1st floor. If you have a lab appointment with the [facility] please come in thru the  Main Entrance and check in at the main information desk  You need to re-schedule your appointment should you arrive 10 or more minutes late.  We strive to give you quality time with our providers, and arriving late affects you and other patients whose appointments are after yours.  Also, if you no show three or more times for appointments you may be dismissed from the clinic at the providers discretion.     Again, thank you for choosing [facility].  Our hope is that these requests will decrease the amount of time that you wait before being seen by our physicians.       _____________________________________________________________  Should you have questions after your visit to [facility], please contact our office at [phone] between the hours of 8:30 a.m. and 4:30 p.m.  Voicemails left after 4:30 p.m. will not be returned until the following business day.  For prescription refill requests, have your pharmacy contact our  office.         Resources For Cancer Patients and their Caregivers ? American Cancer Society: Can assist with transportation, wigs, general needs, runs Look Good Feel Better.        (337)500-6335 ? Cancer Care: Provides financial assistance, online support groups, medication/co-pay assistance.  1-800-813-HOPE 325 816 2487) ? [place] Assists [place] cancer patients and their families through emotional , educational and financial support.  330-721-0710 ? [place] DSS Where to apply for food stamps, Medicaid and utility assistance. (309)487-3414 ? [facility]: Transportation to medical appointments. 401-563-7895 ? Social Security Administration: May apply for disability if have a Stage IV cancer. (763)183-1506 (913) 454-6825 ? [employer], Disability and Transit Services: Assists with nutrition, care and transit needs. [phone]

## 2015-03-23 NOTE — Progress Notes (Signed)
Valerie Poe presented for Portacath access and flush. Proper placement of portacath confirmed by CXR. Portacath located right chest wall accessed with  H 20 needle. Good blood return present. Portacath flushed with 20ml NS and 500U/5ml Heparin and needle removed intact. Procedure without incident. Patient tolerated procedure well.   

## 2015-05-11 ENCOUNTER — Encounter (HOSPITAL_COMMUNITY): Payer: Self-pay

## 2015-05-11 ENCOUNTER — Encounter (HOSPITAL_COMMUNITY): Payer: Medicare Other | Attending: Hematology and Oncology

## 2015-05-11 DIAGNOSIS — Z170 Estrogen receptor positive status [ER+]: Secondary | ICD-10-CM | POA: Insufficient documentation

## 2015-05-11 DIAGNOSIS — Z452 Encounter for adjustment and management of vascular access device: Secondary | ICD-10-CM

## 2015-05-11 DIAGNOSIS — M810 Age-related osteoporosis without current pathological fracture: Secondary | ICD-10-CM | POA: Insufficient documentation

## 2015-05-11 DIAGNOSIS — C50911 Malignant neoplasm of unspecified site of right female breast: Secondary | ICD-10-CM | POA: Insufficient documentation

## 2015-05-11 DIAGNOSIS — C50912 Malignant neoplasm of unspecified site of left female breast: Secondary | ICD-10-CM | POA: Diagnosis not present

## 2015-05-11 DIAGNOSIS — I10 Essential (primary) hypertension: Secondary | ICD-10-CM | POA: Insufficient documentation

## 2015-05-11 MED ORDER — HEPARIN SOD (PORK) LOCK FLUSH 100 UNIT/ML IV SOLN
500.0000 [IU] | Freq: Once | INTRAVENOUS | Status: AC
  Administered 2015-05-11: 500 [IU] via INTRAVENOUS

## 2015-05-11 MED ORDER — HEPARIN SOD (PORK) LOCK FLUSH 100 UNIT/ML IV SOLN
INTRAVENOUS | Status: AC
  Filled 2015-05-11: qty 5

## 2015-05-11 MED ORDER — SODIUM CHLORIDE 0.9% FLUSH
10.0000 mL | INTRAVENOUS | Status: DC | PRN
  Administered 2015-05-11: 10 mL via INTRAVENOUS
  Filled 2015-05-11: qty 10

## 2015-05-11 NOTE — Patient Instructions (Signed)
[facility] at [facility] Discharge Instructions  RECOMMENDATIONS MADE BY THE CONSULTANT AND ANY TEST RESULTS WILL BE SENT TO YOUR REFERRING PHYSICIAN.  Port flush today. Return as scheduled for port flushes. Return as scheduled for office visit.   Thank you for choosing [facility] at [facility] to provide your oncology and hematology care.  To afford each patient quality time with our provider, please arrive at least 15 minutes before your scheduled appointment time.   Beginning January 23rd 2017 lab work for the Cancer Center will be done in the  Main lab at [place] [facility] on 1st floor. If you have a lab appointment with the Cancer Center please come in thru the  Main Entrance and check in at the main information desk  You need to re-schedule your appointment should you arrive 10 or more minutes late.  We strive to give you quality time with our providers, and arriving late affects you and other patients whose appointments are after yours.  Also, if you no show three or more times for appointments you may be dismissed from the clinic at the providers discretion.     Again, thank you for choosing [facility].  Our hope is that these requests will decrease the amount of time that you wait before being seen by our physicians.       _____________________________________________________________  Should you have questions after your visit to [facility], please contact our office at [phone] between the hours of 8:30 a.m. and 4:30 p.m.  Voicemails left after 4:30 p.m. will not be returned until the following business day.  For prescription refill requests, have your pharmacy contact our office.         Resources For Cancer Patients and their Caregivers ? American Cancer Society: Can assist with transportation, wigs, general needs, runs Look Good Feel Better.        1-888-227-6333 ? Cancer Care: Provides financial  assistance, online support groups, medication/co-pay assistance.  1-800-813-HOPE (4673) ? [facility] Assists [place] cancer patients and their families through emotional , educational and financial support.  336-427-4357 ? [place] DSS Where to apply for food stamps, Medicaid and utility assistance. 336-342-1394 ? [facility]: Transportation to medical appointments. 336-347-2287 ? Social Security Administration: May apply for disability if have a Stage IV cancer. 336-342-7796 1-800-772-1213 ? [place] [facility], Disability and Transit Services: Assists with nutrition, care and transit needs. 336-349-2343  Cancer Center Support Programs: [email or web address]@ > Cancer Support Group  2nd Tuesday of the month 1pm-2pm, Journey Room  > Creative Journey  3rd Tuesday of the month 1130am-1pm, Journey Room  > Look Good Feel Better  1st Wednesday of the month 10am-12 noon, Journey Room (Call American Cancer Society to register 1-800-395-5775)   

## 2015-05-11 NOTE — Progress Notes (Signed)
Tiffany Sweeney presented for Portacath access and flush.  Portacath located right chest wall accessed with H 20 needle.  Good blood return present. Portacath flushed with 60ml NS and 500U/51ml Heparin and needle removed intact.  Procedure tolerated well and without incident.

## 2015-06-16 ENCOUNTER — Telehealth (HOSPITAL_COMMUNITY): Payer: Self-pay | Admitting: *Deleted

## 2015-06-16 NOTE — Telephone Encounter (Signed)
Ready and in basket in front

## 2015-06-23 ENCOUNTER — Ambulatory Visit (HOSPITAL_COMMUNITY): Payer: Medicare Other | Admitting: Oncology

## 2015-06-23 ENCOUNTER — Encounter (HOSPITAL_COMMUNITY): Payer: Medicare Other

## 2015-07-02 ENCOUNTER — Encounter (HOSPITAL_COMMUNITY): Payer: Medicare Other | Attending: Hematology and Oncology | Admitting: Hematology & Oncology

## 2015-07-02 ENCOUNTER — Encounter (HOSPITAL_COMMUNITY): Payer: Self-pay | Admitting: Lab

## 2015-07-02 ENCOUNTER — Encounter (HOSPITAL_COMMUNITY): Payer: Self-pay | Admitting: Hematology & Oncology

## 2015-07-02 ENCOUNTER — Encounter (HOSPITAL_BASED_OUTPATIENT_CLINIC_OR_DEPARTMENT_OTHER): Payer: Medicare Other

## 2015-07-02 VITALS — BP 159/85 | HR 59 | Temp 98.1°F | Resp 18 | Wt 164.0 lb

## 2015-07-02 DIAGNOSIS — M810 Age-related osteoporosis without current pathological fracture: Secondary | ICD-10-CM | POA: Diagnosis not present

## 2015-07-02 DIAGNOSIS — I10 Essential (primary) hypertension: Secondary | ICD-10-CM | POA: Insufficient documentation

## 2015-07-02 DIAGNOSIS — C50911 Malignant neoplasm of unspecified site of right female breast: Secondary | ICD-10-CM | POA: Diagnosis present

## 2015-07-02 DIAGNOSIS — Z853 Personal history of malignant neoplasm of breast: Secondary | ICD-10-CM

## 2015-07-02 DIAGNOSIS — M25551 Pain in right hip: Secondary | ICD-10-CM

## 2015-07-02 DIAGNOSIS — M8580 Other specified disorders of bone density and structure, unspecified site: Secondary | ICD-10-CM | POA: Diagnosis not present

## 2015-07-02 DIAGNOSIS — Z170 Estrogen receptor positive status [ER+]: Secondary | ICD-10-CM | POA: Diagnosis not present

## 2015-07-02 DIAGNOSIS — Z95828 Presence of other vascular implants and grafts: Secondary | ICD-10-CM

## 2015-07-02 DIAGNOSIS — C50912 Malignant neoplasm of unspecified site of left female breast: Secondary | ICD-10-CM | POA: Diagnosis present

## 2015-07-02 DIAGNOSIS — C50412 Malignant neoplasm of upper-outer quadrant of left female breast: Secondary | ICD-10-CM

## 2015-07-02 DIAGNOSIS — N9089 Other specified noninflammatory disorders of vulva and perineum: Secondary | ICD-10-CM

## 2015-07-02 LAB — CBC WITH DIFFERENTIAL/PLATELET
Basophils Absolute: 0 10*3/uL (ref 0.0–0.1)
Basophils Relative: 0 %
EOS ABS: 0.1 10*3/uL (ref 0.0–0.7)
Eosinophils Relative: 2 %
HCT: 39.1 % (ref 36.0–46.0)
HEMOGLOBIN: 13.1 g/dL (ref 12.0–15.0)
LYMPHS ABS: 1.4 10*3/uL (ref 0.7–4.0)
Lymphocytes Relative: 26 %
MCH: 30.1 pg (ref 26.0–34.0)
MCHC: 33.5 g/dL (ref 30.0–36.0)
MCV: 89.9 fL (ref 78.0–100.0)
MONO ABS: 0.6 10*3/uL (ref 0.1–1.0)
MONOS PCT: 11 %
NEUTROS PCT: 61 %
Neutro Abs: 3.4 10*3/uL (ref 1.7–7.7)
Platelets: 202 10*3/uL (ref 150–400)
RBC: 4.35 MIL/uL (ref 3.87–5.11)
RDW: 13.5 % (ref 11.5–15.5)
WBC: 5.5 10*3/uL (ref 4.0–10.5)

## 2015-07-02 LAB — COMPREHENSIVE METABOLIC PANEL
ALBUMIN: 3.8 g/dL (ref 3.5–5.0)
ALK PHOS: 62 U/L (ref 38–126)
ALT: 14 U/L (ref 14–54)
AST: 18 U/L (ref 15–41)
Anion gap: 6 (ref 5–15)
BUN: 17 mg/dL (ref 6–20)
CHLORIDE: 105 mmol/L (ref 101–111)
CO2: 27 mmol/L (ref 22–32)
CREATININE: 0.67 mg/dL (ref 0.44–1.00)
Calcium: 8.8 mg/dL — ABNORMAL LOW (ref 8.9–10.3)
GFR calc non Af Amer: >60 mL/min (ref >60)
GLUCOSE: 73 mg/dL (ref 65–99)
Potassium: 3.8 mmol/L (ref 3.5–5.1)
SODIUM: 138 mmol/L (ref 135–145)
Total Bilirubin: 0.4 mg/dL (ref 0.3–1.2)
Total Protein: 6.5 g/dL (ref 6.5–8.1)

## 2015-07-02 MED ORDER — SODIUM CHLORIDE 0.9% FLUSH
10.0000 mL | INTRAVENOUS | Status: DC | PRN
  Administered 2015-07-02: 10 mL via INTRAVENOUS
  Filled 2015-07-02: qty 10

## 2015-07-02 MED ORDER — HEPARIN SOD (PORK) LOCK FLUSH 100 UNIT/ML IV SOLN
500.0000 [IU] | Freq: Once | INTRAVENOUS | Status: AC
  Administered 2015-07-02: 500 [IU] via INTRAVENOUS

## 2015-07-02 NOTE — Progress Notes (Signed)
[name], MD [address] [place] [address]  Stage I triple negative carcinoma of the left breast s/p mastectomy and axillary LN disssection. History of Carcinoma of the R breast    Breast cancer ([place])   07/25/2013 Imaging Plain film of the lumbar spine, complete 4 view. Chronic lumbar degenerative change most prominent at L4-5. No acute abnormality and no change from prior study   10/21/2013 Initial Diagnosis Left breast cancer, core biopsy, ER negative, PR negative, HER-2/neu negative, Ki-67 of 13%, infiltrating duct cell   11/04/2013 Surgery Left modified radical mastectomy, 1.8 cm infiltrating duct cell carcinoma, ER negative, PR negative, HER-2/neu not overexpressed, 9 lymph nodes negative. Stage TI cN0 M0.   12/06/2013 Imaging Portable chest x-ray showing no pneumothorax no lung edema or consolidation Port-A-Cath tip in the SVC.   12/23/2013 - 02/03/2014 Chemotherapy Cycle 1 of TC initiated on 12/23/2013, patient completed 3 cycles of prescirbed therapy     CURRENT THERAPY: Observation  INTERVAL HISTORY: Tiffany Sweeney 81 y.o. female returns for follow-up of her breast cancer.   Tiffany Sweeney returns to the [place] today accompanied by her daughter.  She notes that she is continuing to have vaginal burning. She is putting on benadryl anti-itch ointment, but once she stops using that, she says there seems to be an irritation that persists. When asked when the last time was she's seen a gynecologist, she says "I have no idea." She would say that the whole area is red and inflamed. She says it will go away for a week or something, then all of a sudden, she will feel it return. She says that the outside isn't cleared up, and that it definitely feels external as opposed to internal.  Her hip replacement is coming up on August 2. Her daughter is relieved that she is pursuing this.   Her daughter says that she needs to be a little more positive, and that her  attitude isn't always great about her medical appointments.  In general, she says her feet are exactly the same; no change there. She notes that she doesn't have lots of energy, but remarks that she washed the curtains recently.  When asked about her appetite, she says it's good. At night, she's getting up to pee a lot.  Other than that, she doesn't have anything she's worried or concerned about. Her daughter thinks she's doing okay, just very nervous about her operation, and coming in for her appointments.  Her daughter asks if she can go swimming in the water with her port and was advised that this should be fine.  She is able to ambulate to the exam table with the assistance of her daughter.  The patient notes that her left axillary region is tender, during the physical exam. She confirms that her bowels are doing okay. She takes a pill every day to assist with her bowels and wonders if she can take milk of magnesia instead, and was advised that yes, she can. She confirms that her shoulder pain has resolved.  With regards to a referral to gyn/onc, the patient notes that it doesn't matter where she goes.  She may need another prescription for a bra prosthesis. She would like to get two prostheses at once so she can get two of the same, and indicates that she has to wait until December for this.   MEDICAL HISTORY: Past Medical History  Diagnosis Date  . High cholesterol   . Hypertension   .  Anxiety   . Cancer [facility])     breast- right  . Arthritis   . Breast cancer ([place])   . Family history of ovarian cancer   . Family history of colon cancer   . Family history of pancreatic cancer   . Skin cancer     Patient stated  she has low grade skin cancer in right hand    has Low back pain; Breast cancer ([place]); Family history of ovarian cancer; Family history of colon cancer; Family history of pancreatic cancer; and Genetic testing on her problem list.     has No Known Allergies.  Ms.  Sweeney had no medications administered during this visit.  SURGICAL HISTORY: Past Surgical History  Procedure Laterality Date  . Abdominal hysterectomy    . Mastectomy Right   . Cataract extraction w/phaco Left 03/11/2013    Procedure: CATARACT EXTRACTION PHACO AND INTRAOCULAR LENS PLACEMENT (IOC);  Surgeon: Tonny Branch, MD;  Location: [facility];  Service: Ophthalmology;  Laterality: Left;  CDE:7.54  . Cataract extraction w/phaco Right 04/15/2013    Procedure: CATARACT EXTRACTION PHACO AND INTRAOCULAR LENS PLACEMENT (IOC);  Surgeon: Tonny Branch, MD;  Location: [facility];  Service: Ophthalmology;  Laterality: Right;  CDE 9.38  . Mastectomy modified radical Left 11/04/2013    Procedure: LEFT MASTECTOMY MODIFIED RADICAL;  Surgeon: Jamesetta So, MD;  Location: [facility];  Service: General;  Laterality: Left;  . Portacath placement Right 12/06/2013    Procedure: INSERTION PORT-A-CATH-Right subclavian;  Surgeon: Jamesetta So, MD;  Location: [facility];  Service: General;  Laterality: Right;    SOCIAL HISTORY: Social History   Social History  . Marital Status: Widowed    Spouse Name: N/A  . Number of Children: 3  . Years of Education: N/A   Occupational History  . Not on file.   Social History Main Topics  . Smoking status: Former Smoker -- 0.25 packs/day for 30 years    Types: Cigarettes    Start date: 10/10/1956    Quit date: 03/08/1985  . Smokeless tobacco: Never Used     Comment: quit 25 years ago  . Alcohol Use: No  . Drug Use: No  . Sexual Activity: Yes    Birth Control/ Protection: Post-menopausal   Other Topics Concern  . Not on file   Social History Narrative    FAMILY HISTORY: Family History  Problem Relation Age of Onset  . Diabetes Mother   . Rectal cancer Mother 25    died from complications of DM  . Dementia Father     Died at 50 in an accident  . Pancreatic cancer Brother 53  . Lung cancer Sister 28    "small oat cell"  . Ovarian cancer Sister 71  . Cancer Cousin       maternal 1st cousin with either ovarian or uterine  . Colon cancer Cousin     paternal first cousin dx at unknown age    Review of Systems  Constitutional: Positive for malaise/fatigue. Negative for fever, chills and weight loss.  HENT: Negative for congestion, hearing loss, nosebleeds, sore throat and tinnitus.   Eyes: Negative for blurred vision, double vision, pain and discharge.  Respiratory: Negative for cough, hemoptysis, sputum production, shortness of breath and wheezing.   Cardiovascular: Negative for chest pain, palpitations, claudication, leg swelling and PND.  Gastrointestinal: Negative for heartburn, nausea, vomiting, abdominal pain, diarrhea, constipation, blood in stool and melena.  Genitourinary: Negative for dysuria, urgency, frequency and hematuria.  Musculoskeletal:  Positive for joint pain. Negative for myalgias and falls.       Chronic LE swelling , joint pain, hip pain Skin: Negative for itching and rash.  Neurological: Positive for sensory change and weakness,. Negative for tingling, tremors, speech change, focal weakness, seizures, loss of consciousness and headaches.  Endo/Heme/Allergies: Does not bruise/bleed easily.  Psychiatric/Behavioral: Negative for depression, suicidal ideas, memory loss and substance abuse. The patient is not nervous/anxious and does not have insomnia.   14 point review of systems was performed and is negative except as detailed under history of present illness and above   PHYSICAL EXAMINATION  ECOG PERFORMANCE STATUS: 1 - Symptomatic but completely ambulatory  Filed Vitals:   07/02/15 1000  BP: 159/85  Pulse: 59  Temp: 98.1 F (36.7 C)  Resp: 18    Physical Exam  Constitutional: She is oriented to person, place, and time and well-developed, well-nourished, and in no distress. hair regrowth. Well groomed. Walks to the exam table but definitely limps favoring R hip. Able to get on exam table with assistance. HENT:  Head:  Normocephalic and atraumatic.  Nose: Nose normal.  Mouth/Throat: Oropharynx is clear and moist. No oropharyngeal exudate.  Eyes: Conjunctivae and EOM are normal. Pupils are equal, round, and reactive to light. Right eye exhibits no discharge. Left eye exhibits no discharge. No scleral icterus.  Neck: Normal range of motion. Neck supple. No tracheal deviation present. No thyromegaly present.  Cardiovascular: Normal rate, regular rhythm and normal heart sounds.  Exam reveals no gallop and no friction rub.   No murmur heard. Pulmonary/Chest: Effort normal and breath sounds normal. She has no wheezes. She has no rales.  Abdominal: Soft. Bowel sounds are normal. She exhibits no distension and no mass. There is no tenderness. There is no rebound and no guarding.  Musculoskeletal: Normal range of motion. She exhibits no edema. R leg with external rotation during gait Left lower extremity ankle swelling is chronic  Lymphadenopathy:    She has no cervical adenopathy.  Neurological: She is alert and oriented to person, place, and time. She has normal reflexes. No cranial nerve deficit. Coordination normal.  Skin: Skin is warm and dry. No rash noted. Small area of "irritation" on LLE. Does not appear suspicious Psychiatric: Mood, memory, affect and judgment normal.  Chest: Bilateral mastectomy sites are well healed without erythema or palpable abnormalities. Nursing note and vitals reviewed.   LABORATORY DATA: I have reviewed the data below as listed.  Results for TAMECKA, MILHAM (MRN [id number]) as of 07/19/2015 15:20  Ref. Range 07/02/2015 11:10  Sodium Latest Ref Range: 135-145 mmol/L 138  Potassium Latest Ref Range: 3.5-5.1 mmol/L 3.8  Chloride Latest Ref Range: 101-111 mmol/L 105  CO2 Latest Ref Range: 22-32 mmol/L 27  BUN Latest Ref Range: 6-20 mg/dL 17  Creatinine Latest Ref Range: 0.44-1.00 mg/dL 0.67  Calcium Latest Ref Range: 8.9-10.3 mg/dL 8.8 (L)  EGFR (Non-African Amer.) Latest Ref  Range: >60 mL/min >60  EGFR (African American) Latest Ref Range: >60 mL/min >60  Glucose Latest Ref Range: 65-99 mg/dL 73  Anion gap Latest Ref Range: 5-15  6  Alkaline Phosphatase Latest Ref Range: 38-126 U/L 62  Albumin Latest Ref Range: 3.5-5.0 g/dL 3.8  AST Latest Ref Range: 15-41 U/L 18  ALT Latest Ref Range: 14-54 U/L 14  Total Protein Latest Ref Range: 6.5-8.1 g/dL 6.5  Total Bilirubin Latest Ref Range: 0.3-1.2 mg/dL 0.4  WBC Latest Ref Range: 4.0-10.5 K/uL 5.5  RBC Latest Ref Range: 3.87-5.11 MIL/uL  4.35  Hemoglobin Latest Ref Range: 12.0-15.0 g/dL 13.1  HCT Latest Ref Range: 36.0-46.0 % 39.1  MCV Latest Ref Range: 78.0-100.0 fL 89.9  MCH Latest Ref Range: 26.0-34.0 pg 30.1  MCHC Latest Ref Range: 30.0-36.0 g/dL 33.5  RDW Latest Ref Range: 11.5-15.5 % 13.5  Platelets Latest Ref Range: 150-400 K/uL 202  Neutrophils Latest Units: % 61  Lymphocytes Latest Units: % 26  Monocytes Relative Latest Units: % 11  Eosinophil Latest Units: % 2  Basophil Latest Units: % 0  NEUT# Latest Ref Range: 1.7-7.7 K/uL 3.4  Lymphocyte # Latest Ref Range: 0.7-4.0 K/uL 1.4  Monocyte # Latest Ref Range: 0.1-1.0 K/uL 0.6  Eosinophils Absolute Latest Ref Range: 0.0-0.7 K/uL 0.1  Basophils Absolute Latest Ref Range: 0.0-0.1 K/uL 0.0    RADIOGRAPHIC STUDIES: I have personally reviewed the radiological images as listed and agreed with the findings in the report. Study Result     CLINICAL DATA: History of breast cancer in 1997 and in in 2015. Left mastectomy last year.  EXAM: NUCLEAR MEDICINE WHOLE BODY BONE SCAN  TECHNIQUE: Whole body anterior and posterior images were obtained approximately 3 hours after intravenous injection of radiopharmaceutical.  RADIOPHARMACEUTICALS: 27.0 mCi Technetium-41mMDP IV  COMPARISON: None.  FINDINGS: There are no foci of increased or decreased radiotracer uptake to suggest osseous metastatic disease.  Small focus of increased radiotracer uptake  in the right hindfoot likely degenerative.  Normal physiologic activity is identified within the kidneys and urinary bladder.  IMPRESSION: No scintigraphic evidence of osseous metastatic disease.   Electronically Signed  By: HKathreen Devoid On: 01/08/2015 14:45    ASSESSMENT and THERAPY PLAN:   Stage I triple negative carcinoma of the left breast Mastectomy 3 cycles of Taxotere and Cytoxan, discontinued prior to cycle 4 secondary to excessive fatigue, hand/foot syndrome and worsening neuropathy History of R breast cancer Family history of ovarian cancer Osteopenia Vulvar irritation Genetic Testing Negative 04/2014  She has a history of bilateral breast cancers. Currently without obvious recurrence.  She is scheduled for her hip replacement coming up. I have referred her to a gynecologist for further evaluation of her ongoing vulvar/vaginal irritation.   She will return in 3 months for routine follow up. After her next visit we will move her out to 4 month intervals.   I have written her for a bra and 2 prosthesis as she has had bilateral mastectomies.   All questions were answered. The patient knows to call the clinic with any problems, questions or concerns. We can certainly see the patient much sooner if necessary.   This document serves as a record of services personally performed by SAncil Linsey MD. It was created on her behalf by KToni Amend a trained medical scribe. The creation of this record is based on the scribe's personal observations and the provider's statements to them. This document has been checked and approved by the attending provider.  I have reviewed the above documentation for accuracy and completeness, and I agree with the above.  This note was electronically signed.  SKelby Fam PWhitney Muse MD

## 2015-07-02 NOTE — Progress Notes (Signed)
Tiffany Sweeney presented for Portacath access and flush. Portacath located left chest wall accessed with  H 20 needle.  Good blood return present. Portacath flushed with 10ml NS and 500U/48ml Heparin and needle removed intact.  Procedure tolerated well and without incident.

## 2015-07-02 NOTE — Addendum Note (Signed)
Addended by: Joie Bimler on: 07/02/2015 11:22 AM   Modules accepted: Orders

## 2015-07-02 NOTE — Patient Instructions (Signed)
[place] at [facility] Discharge Instructions  RECOMMENDATIONS MADE BY THE CONSULTANT AND ANY TEST RESULTS WILL BE SENT TO YOUR REFERRING PHYSICIAN.  Exam done and seen today by Dr. Whitney Muse Labs done today with port flush. Referred to [place] for irritation Return to see the doctor in 4 months Please call the clinic if you have any questions or concerns   Thank you for choosing [place] at [facility] to provide your oncology and hematology care.  To afford each patient quality time with our provider, please arrive at least 15 minutes before your scheduled appointment time.   Beginning January 23rd 2017 lab work for the [facility] will be done in the  Main lab at [facility] on 1st floor. If you have a lab appointment with the [facility] please come in thru the  Main Entrance and check in at the main information desk  You need to re-schedule your appointment should you arrive 10 or more minutes late.  We strive to give you quality time with our providers, and arriving late affects you and other patients whose appointments are after yours.  Also, if you no show three or more times for appointments you may be dismissed from the clinic at the providers discretion.     Again, thank you for choosing [facility].  Our hope is that these requests will decrease the amount of time that you wait before being seen by our physicians.       _____________________________________________________________  Should you have questions after your visit to [facility], please contact our office at [phone] between the hours of 8:30 a.m. and 4:30 p.m.  Voicemails left after 4:30 p.m. will not be returned until the following business day.  For prescription refill requests, have your pharmacy contact our office.         Resources For Cancer Patients and their Caregivers ? American Cancer Society: Can assist with  transportation, wigs, general needs, runs Look Good Feel Better.        910-416-0357 ? Cancer Care: Provides financial assistance, online support groups, medication/co-pay assistance.  1-800-813-HOPE (279) 020-6762) ? [place] Assists [place] cancer patients and their families through emotional , educational and financial support.  (413)529-6457 ? [place] DSS Where to apply for food stamps, Medicaid and utility assistance. 719 027 5643 ? [facility]: Transportation to medical appointments. 671-712-6729 ? Social Security Administration: May apply for disability if have a Stage IV cancer. 2760815483 559-804-5351 ? [employer], Disability and Transit Services: Assists with nutrition, care and transit needs. [place] Support Programs: @10RELATIVEDAYS @ > Cancer Support Group  2nd Tuesday of the month 1pm-2pm, Journey Room  > Creative Journey  3rd Tuesday of the month 1130am-1pm, Journey Room  > Look Good Feel Better  1st Wednesday of the month 10am-12 noon, Journey Room (Call [place] to register [phone])

## 2015-07-02 NOTE — Progress Notes (Signed)
Referral sent to [facility].  Appt 7/12 @330 . Patient aware of appt

## 2015-07-10 ENCOUNTER — Encounter (HOSPITAL_COMMUNITY)
Admission: RE | Admit: 2015-07-10 | Discharge: 2015-07-10 | Disposition: A | Discharge status: Home / Self Care | Payer: Medicare Other | Source: Ambulatory Visit | Attending: Family Medicine | Admitting: Family Medicine

## 2015-07-10 DIAGNOSIS — H1013 Acute atopic conjunctivitis, bilateral: Secondary | ICD-10-CM | POA: Diagnosis not present

## 2015-07-10 LAB — BASIC METABOLIC PANEL
ANION GAP: 6 (ref 5–15)
BUN: 14 mg/dL (ref 6–20)
CALCIUM: 8.8 mg/dL — AB (ref 8.9–10.3)
CO2: 28 mmol/L (ref 22–32)
Chloride: 107 mmol/L (ref 101–111)
Creatinine, Ser: 0.64 mg/dL (ref 0.44–1.00)
GFR calc Af Amer: >60 mL/min (ref >60)
GFR calc non Af Amer: >60 mL/min (ref >60)
GLUCOSE: 93 mg/dL (ref 65–99)
Potassium: 3.6 mmol/L (ref 3.5–5.1)
Sodium: 141 mmol/L (ref 135–145)

## 2015-07-10 MED ORDER — HEPARIN SOD (PORK) LOCK FLUSH 100 UNIT/ML IV SOLN
INTRAVENOUS | Status: AC
  Filled 2015-07-10: qty 5

## 2015-07-10 MED ORDER — HEPARIN SODIUM (PORCINE) 5000 UNIT/ML IJ SOLN
5000.0000 [IU] | Freq: Once | INTRAMUSCULAR | Status: DC
  Filled 2015-07-10: qty 1

## 2015-07-10 MED ORDER — HEPARIN SOD (PORK) LOCK FLUSH 100 UNIT/ML IV SOLN
500.0000 [IU] | INTRAVENOUS | Status: AC | PRN
Start: 2015-07-10 — End: 2015-07-10
  Administered 2015-07-10: 500 [IU]

## 2015-07-10 NOTE — OR Nursing (Signed)
Patient arrived to have blood work done per order from Dr. Karie Kirks.  bmet drawn and sent to lab.  Drawn from port site , accessed with power loc access kit size 19g. X 1inch.  Blood return noted .  waste 10 cc  Then drew bmet.  Area flushed with saline then flushed with 5 cc heparin per order. Sterile dressing applied.  Procedure performed under sterile technique.

## 2015-07-19 ENCOUNTER — Encounter (HOSPITAL_COMMUNITY): Payer: Self-pay | Admitting: Hematology & Oncology

## 2015-07-27 ENCOUNTER — Ambulatory Visit: Payer: Self-pay | Admitting: Orthopedic Surgery

## 2015-07-30 NOTE — Patient Instructions (Signed)
Tiffany Sweeney  07/30/2015   Your procedure is scheduled on: 08/05/15  Report to [facility] Main  Entrance take [place]  elevators to 3rd floor to  [place] at [place]  AM.  Call this number if you have problems the morning of surgery [phone]   Remember: ONLY 1 PERSON MAY GO WITH YOU TO SHORT STAY TO GET  READY MORNING OF [place].  Do not eat food or drink liquids :After Midnight.     Take these medicines the morning of surgery with A SIP OF WATER: ATENOLOL DO NOT TAKE ANY DIABETIC MEDICATIONS DAY OF YOUR SURGERY                               You may not have any metal on your body including hair pins and              piercings  Do not wear jewelry, make-up, lotions, powders or perfumes, deodorant             Do not wear nail polish.  Do not shave  48 hours prior to surgery.              Men may shave face and neck.   Do not bring valuables to the hospital. [place].  Contacts, dentures or bridgework may not be worn into surgery.  Leave suitcase in the car. After surgery it may be brought to your room.                [facility] - Preparing for Surgery Before surgery, you can play an important role.  Because skin is not sterile, your skin needs to be as free of germs as possible.  You can reduce the number of germs on your skin by washing with CHG (chlorahexidine gluconate) soap before surgery.  CHG is an antiseptic cleaner which kills germs and bonds with the skin to continue killing germs even after washing. Please DO NOT use if you have an allergy to CHG or antibacterial soaps.  If your skin becomes reddened/irritated stop using the CHG and inform your nurse when you arrive at Short Stay. Do not shave (including legs and underarms) for at least 48 hours prior to the first CHG shower.  You may shave your face/neck. Please follow these instructions carefully:  1.  Shower with CHG Soap the night  before surgery and the  morning of Surgery.  2.  If you choose to wash your hair, wash your hair first as usual with your  normal  shampoo.  3.  After you shampoo, rinse your hair and body thoroughly to remove the  shampoo.                           4.  Use CHG as you would any other liquid soap.  You can apply chg directly  to the skin and wash                       Gently with a scrungie or clean washcloth.  5.  Apply the CHG Soap to your body ONLY FROM THE NECK DOWN.   Do not use on face/ open  Wound or open sores. Avoid contact with eyes, ears mouth and genitals (private parts).                       Wash face,  Genitals (private parts) with your normal soap.             6.  Wash thoroughly, paying special attention to the area where your surgery  will be performed.  7.  Thoroughly rinse your body with warm water from the neck down.  8.  DO NOT shower/wash with your normal soap after using and rinsing off  the CHG Soap.                9.  Pat yourself dry with a clean towel.            10.  Wear clean pajamas.            11.  Place clean sheets on your bed the night of your first shower and do not  sleep with pets. Day of Surgery : Do not apply any lotions/deodorants the morning of surgery.  Please wear clean clothes to the hospital/surgery center.  FAILURE TO FOLLOW THESE INSTRUCTIONS MAY RESULT IN THE CANCELLATION OF YOUR SURGERY PATIENT SIGNATURE_________________________________  NURSE SIGNATURE__________________________________  ________________________________________________________________________  WHAT IS A BLOOD TRANSFUSION? Blood Transfusion Information  A transfusion is the replacement of blood or some of its parts. Blood is made up of multiple cells which provide different functions.  Red blood cells carry oxygen and are used for blood loss replacement.  White blood cells fight against infection.  Platelets control bleeding.  Plasma helps clot  blood.  Other blood products are available for specialized needs, such as hemophilia or other clotting disorders. BEFORE THE TRANSFUSION  Who gives blood for transfusions?   Healthy volunteers who are fully evaluated to make sure their blood is safe. This is blood bank blood. Transfusion therapy is the safest it has ever been in the practice of medicine. Before blood is taken from a donor, a complete history is taken to make sure that person has no history of diseases nor engages in risky social behavior (examples are intravenous drug use or sexual activity with multiple partners). The donor's travel history is screened to minimize risk of transmitting infections, such as malaria. The donated blood is tested for signs of infectious diseases, such as HIV and hepatitis. The blood is then tested to be sure it is compatible with you in order to minimize the chance of a transfusion reaction. If you or a relative donates blood, this is often done in anticipation of surgery and is not appropriate for emergency situations. It takes many days to process the donated blood. RISKS AND COMPLICATIONS Although transfusion therapy is very safe and saves many lives, the main dangers of transfusion include:   Getting an infectious disease.  Developing a transfusion reaction. This is an allergic reaction to something in the blood you were given. Every precaution is taken to prevent this. The decision to have a blood transfusion has been considered carefully by your caregiver before blood is given. Blood is not given unless the benefits outweigh the risks. AFTER THE TRANSFUSION  Right after receiving a blood transfusion, you will usually feel much better and more energetic. This is especially true if your red blood cells have gotten low (anemic). The transfusion raises the level of the red blood cells which carry oxygen, and this usually causes an energy increase.  The  nurse administering the transfusion will monitor  you carefully for complications. HOME CARE INSTRUCTIONS  No special instructions are needed after a transfusion. You may find your energy is better. Speak with your caregiver about any limitations on activity for underlying diseases you may have. SEEK MEDICAL CARE IF:   Your condition is not improving after your transfusion.  You develop redness or irritation at the intravenous (IV) site. SEEK IMMEDIATE MEDICAL CARE IF:  Any of the following symptoms occur over the next 12 hours:  Shaking chills.  You have a temperature by mouth above 102 F (38.9 C), not controlled by medicine.  Chest, back, or muscle pain.  People around you feel you are not acting correctly or are confused.  Shortness of breath or difficulty breathing.  Dizziness and fainting.  You get a rash or develop hives.  You have a decrease in urine output.  Your urine turns a dark color or changes to pink, red, or brown. Any of the following symptoms occur over the next 10 days:  You have a temperature by mouth above 102 F (38.9 C), not controlled by medicine.  Shortness of breath.  Weakness after normal activity.  The white part of the eye turns yellow (jaundice).  You have a decrease in the amount of urine or are urinating less often.  Your urine turns a dark color or changes to pink, red, or brown. Document Released: 12/18/1999 Document Revised: 03/14/2011 Document Reviewed: 08/06/2007 ExitCare Patient Information 2014 [place].  _______________________________________________________________________  Incentive Spirometer  An incentive spirometer is a tool that can help keep your lungs clear and active. This tool measures how well you are filling your lungs with each breath. Taking long deep breaths may help reverse or decrease the chance of developing breathing (pulmonary) problems (especially infection) following:  A long period of time when you are unable to move or be active. BEFORE THE  PROCEDURE   If the spirometer includes an indicator to show your best effort, your nurse or respiratory therapist will set it to a desired goal.  If possible, sit up straight or lean slightly forward. Try not to slouch.  Hold the incentive spirometer in an upright position. INSTRUCTIONS FOR USE  1. Sit on the edge of your bed if possible, or sit up as far as you can in bed or on a chair. 2. Hold the incentive spirometer in an upright position. 3. Breathe out normally. 4. Place the mouthpiece in your mouth and seal your lips tightly around it. 5. Breathe in slowly and as deeply as possible, raising the piston or the ball toward the top of the column. 6. Hold your breath for 3-5 seconds or for as long as possible. Allow the piston or ball to fall to the bottom of the column. 7. Remove the mouthpiece from your mouth and breathe out normally. 8. Rest for a few seconds and repeat Steps 1 through 7 at least 10 times every 1-2 hours when you are awake. Take your time and take a few normal breaths between deep breaths. 9. The spirometer may include an indicator to show your best effort. Use the indicator as a goal to work toward during each repetition. 10. After each set of 10 deep breaths, practice coughing to be sure your lungs are clear. If you have an incision (the cut made at the time of surgery), support your incision when coughing by placing a pillow or rolled up towels firmly against it. Once you are able to get  out of bed, walk around indoors and cough well. You may stop using the incentive spirometer when instructed by your caregiver.  RISKS AND COMPLICATIONS  Take your time so you do not get dizzy or light-headed.  If you are in pain, you may need to take or ask for pain medication before doing incentive spirometry. It is harder to take a deep breath if you are having pain. AFTER USE  Rest and breathe slowly and easily.  It can be helpful to keep track of a log of your progress. Your  caregiver can provide you with a simple table to help with this. If you are using the spirometer at home, follow these instructions: [place] IF:   You are having difficultly using the spirometer.  You have trouble using the spirometer as often as instructed.  Your pain medication is not giving enough relief while using the spirometer.  You develop fever of 100.5 F (38.1 C) or higher. SEEK IMMEDIATE MEDICAL CARE IF:   You cough up bloody sputum that had not been present before.  You develop fever of 102 F (38.9 C) or greater.  You develop worsening pain at or near the incision site. MAKE SURE YOU:   Understand these instructions.  Will watch your condition.  Will get help right away if you are not doing well or get worse. Document Released: 05/02/2006 Document Revised: 03/14/2011 Document Reviewed: 07/03/2006 [employer] Patient Information 2014 [place], [place].   ________________________________________________________________________

## 2015-07-30 NOTE — Progress Notes (Signed)
Patient made preop appointment on 07/29/2015 for preop on 07/31/2015 with surgery scheduled on 08/05/2015.  Patient stated she has a port and is adamant about labs being drawn thru port for preop labs.  I called patient on 07/29/2015 to inform her no one here could do labs via port but I would call [place] on 7/27 to see if they could draw labs thru port on 7/27 or 07/31/2015.  I called [place] on 07/30/15 and they have no lab appointments for 7/27 or 7/28.  Patient was called and informed of this.  I then called the [place] here at [facility] and spoke with [place] then Maudie Mercury in lab .  Maggie , Nurse Manager from [facility] spoke with me and stated they would draw preop labs via port on 07/31/2015 after preop appointment with time of lab draw being approximately 0900am. We are to bring patient to [place].  Patient will check in at [facility] .  We are to bring lab labels over to [place] then I told Burman Nieves we would take labs to Main Lab to be processed.

## 2015-07-30 NOTE — Progress Notes (Signed)
TEE 3/16

## 2015-07-30 NOTE — Progress Notes (Signed)
Clearance dr Rosine Door chart

## 2015-07-31 ENCOUNTER — Encounter (HOSPITAL_COMMUNITY)
Admission: RE | Admit: 2015-07-31 | Discharge: 2015-07-31 | Disposition: A | Discharge status: Home / Self Care | Payer: Medicare Other | Source: Ambulatory Visit | Attending: Orthopedic Surgery | Admitting: Orthopedic Surgery

## 2015-07-31 ENCOUNTER — Encounter (HOSPITAL_COMMUNITY): Payer: Self-pay

## 2015-07-31 ENCOUNTER — Other Ambulatory Visit: Payer: Medicare Other

## 2015-07-31 ENCOUNTER — Ambulatory Visit (HOSPITAL_BASED_OUTPATIENT_CLINIC_OR_DEPARTMENT_OTHER): Payer: Medicare Other

## 2015-07-31 DIAGNOSIS — Z01818 Encounter for other preprocedural examination: Secondary | ICD-10-CM | POA: Diagnosis not present

## 2015-07-31 DIAGNOSIS — I10 Essential (primary) hypertension: Secondary | ICD-10-CM | POA: Diagnosis not present

## 2015-07-31 DIAGNOSIS — M1611 Unilateral primary osteoarthritis, right hip: Secondary | ICD-10-CM | POA: Insufficient documentation

## 2015-07-31 DIAGNOSIS — Z0183 Encounter for blood typing: Secondary | ICD-10-CM | POA: Insufficient documentation

## 2015-07-31 DIAGNOSIS — Z95828 Presence of other vascular implants and grafts: Secondary | ICD-10-CM

## 2015-07-31 DIAGNOSIS — Z853 Personal history of malignant neoplasm of breast: Secondary | ICD-10-CM | POA: Insufficient documentation

## 2015-07-31 DIAGNOSIS — Z79899 Other long term (current) drug therapy: Secondary | ICD-10-CM | POA: Insufficient documentation

## 2015-07-31 DIAGNOSIS — E7800 Pure hypercholesterolemia, unspecified: Secondary | ICD-10-CM | POA: Insufficient documentation

## 2015-07-31 DIAGNOSIS — Z87891 Personal history of nicotine dependence: Secondary | ICD-10-CM | POA: Diagnosis not present

## 2015-07-31 DIAGNOSIS — Z01812 Encounter for preprocedural laboratory examination: Secondary | ICD-10-CM | POA: Diagnosis not present

## 2015-07-31 DIAGNOSIS — R001 Bradycardia, unspecified: Secondary | ICD-10-CM | POA: Diagnosis not present

## 2015-07-31 LAB — COMPREHENSIVE METABOLIC PANEL
ALBUMIN: 3.9 g/dL (ref 3.5–5.0)
ALK PHOS: 64 U/L (ref 38–126)
ALT: 13 U/L — AB (ref 14–54)
ANION GAP: 6 (ref 5–15)
AST: 16 U/L (ref 15–41)
BILIRUBIN TOTAL: 0.8 mg/dL (ref 0.3–1.2)
BUN: 16 mg/dL (ref 6–20)
CALCIUM: 9 mg/dL (ref 8.9–10.3)
CO2: 27 mmol/L (ref 22–32)
CREATININE: 0.59 mg/dL (ref 0.44–1.00)
Chloride: 105 mmol/L (ref 101–111)
GFR calc Af Amer: >60 mL/min (ref >60)
GFR calc non Af Amer: >60 mL/min (ref >60)
GLUCOSE: 84 mg/dL (ref 65–99)
Potassium: 3.9 mmol/L (ref 3.5–5.1)
SODIUM: 138 mmol/L (ref 135–145)
TOTAL PROTEIN: 6.7 g/dL (ref 6.5–8.1)

## 2015-07-31 LAB — CBC
HEMATOCRIT: 39.1 % (ref 36.0–46.0)
HEMOGLOBIN: 13 g/dL (ref 12.0–15.0)
MCH: 29.7 pg (ref 26.0–34.0)
MCHC: 33.2 g/dL (ref 30.0–36.0)
MCV: 89.3 fL (ref 78.0–100.0)
Platelets: 217 10*3/uL (ref 150–400)
RBC: 4.38 MIL/uL (ref 3.87–5.11)
RDW: 13.6 % (ref 11.5–15.5)
WBC: 6.2 10*3/uL (ref 4.0–10.5)

## 2015-07-31 LAB — PROTIME-INR
INR: 1.06
Prothrombin Time: 13.8 seconds (ref 11.4–15.2)

## 2015-07-31 LAB — APTT: APTT: 74 s — AB (ref 24–36)

## 2015-07-31 LAB — URINALYSIS, ROUTINE W REFLEX MICROSCOPIC
Bilirubin Urine: NEGATIVE
Glucose, UA: NEGATIVE mg/dL
HGB URINE DIPSTICK: NEGATIVE
Ketones, ur: NEGATIVE mg/dL
LEUKOCYTES UA: NEGATIVE
NITRITE: NEGATIVE
PROTEIN: NEGATIVE mg/dL
SPECIFIC GRAVITY, URINE: 1.013 (ref 1.005–1.030)
pH: 6 (ref 5.0–8.0)

## 2015-07-31 LAB — SURGICAL PCR SCREEN
MRSA, PCR: NEGATIVE
Staphylococcus aureus: NEGATIVE

## 2015-07-31 MED ORDER — HEPARIN SOD (PORK) LOCK FLUSH 100 UNIT/ML IV SOLN
500.0000 [IU] | Freq: Once | INTRAVENOUS | Status: AC
  Administered 2015-07-31: 500 [IU] via INTRAVENOUS
  Filled 2015-07-31: qty 5

## 2015-07-31 MED ORDER — SODIUM CHLORIDE 0.9% FLUSH
10.0000 mL | INTRAVENOUS | Status: DC | PRN
  Administered 2015-07-31: 10 mL via INTRAVENOUS
  Filled 2015-07-31: qty 10

## 2015-07-31 NOTE — Progress Notes (Signed)
PAT NOTE Pt had just voided prior to appt.  Attempted to get urine without success.  Gave lg glass water, taken to x ray. Returned and at 1000 daughter stated still no urge in spite of 4 glasses of water, and coffee this am, and currently drinking coffee.  Asked daughter to give it one more hour.  At 1100 they both were gone from waiting room

## 2015-08-02 ENCOUNTER — Ambulatory Visit: Payer: Self-pay | Admitting: Orthopedic Surgery

## 2015-08-02 NOTE — H&P (Signed)
Tiffany Sweeney DOB: 05/17/1934 Widowed / Language: Cleophus Molt / Race: White Female Date of Admission:  08/05/2015 CC:  Right Hip Pain History of Present Illness The patient is a 81 year old female who comes in for a preoperative History and Physical. The patient is scheduled for a right total hip arthroplasty (anterior) to be performed by Dr. Dione Plover. Aluisio, MD at [facility] on 08/05/2015. The patient is a 81 year old female who presented with a hip problem. The patient reports right hip problems including pain (Pt. right leg can not get straight/it turns outwards), weakness, giving way, catching and pt. has a hard time rising from a chair/going up stairs symptoms that have been present for 2 year(s). The symptoms began without any known injury. Symptoms reported include hip pain, pain with weightbearing and difficulty bearing weight The patient reports symptoms radiating to the: right groin. The patient describes the hip problem as aching.The symptoms are described as moderate in severity.The patient feels as if their symptoms are does feel they are worsening. Current treatment includes application of heat. Previous workup for this problem has included bone scan (bone sacn 2017). Previous treatment for this problem has included physical therapy (for back issues/caused right hip pain after PT 2 year ago). Right hip is getting progressively worse over time. She is having pain, but worse than the pain are the functional deficits that she is having. She has lost a lot of motion in that hip over time. She can no longer bend down to tie her shoes or cannot get in and out of a chair or car easily. She is having pain associated with this, but the pain is not constant or intolerable at this time. The functional difficulties are more intolerable for her. Left side bothers her a little bit, nowhere near as bad as the right. Her hip got worse and that she was doing physical therapy for her back.  AP pelvis  and lateral of the right hip severe end stage arthritis right hip, bone on bone throughout, large subchondral cystic formation. Left hip has mild changes, but nowhere near as bad as the right. At this point, the most predictable means of improving pain and function is total hip arthroplasty. The procedure, risks, potential complications and rehab course are discussed in detail and the patient elects to proceed. She has got advanced end stage arthritis right hip with progressively worsening dysfunction as well as pain associated with it. Function is her big problem and the most predictable means of improving her function at this point would be a total hip arthroplasty. We did discuss that in detail and she would like to go ahead and proceed. They have been treated conservatively in the past for the above stated problem and despite conservative measures, they continue to have progressive pain and severe functional limitations and dysfunction. They have failed non-operative management including home exercise, medications. It is felt that they would benefit from undergoing total joint replacement. Risks and benefits of the procedure have been discussed with the patient and they elect to proceed with surgery. There are no active contraindications to surgery such as ongoing infection or rapidly progressive neurological disease.   Problem List/Past Medical Primary osteoarthritis of right hip (M16.11)  Anxiety Disorder  Breast Cancer  Bilateral Skin Cancer  Hypertension  Hypercholesterolemia  Jaundice  Childhood Years  Allergies  No Known Drug Allergies  Family History  Cancer  Brother, Mother, Sister. Diabetes Mellitus  Mother. Heart Disease  Father. Hypertension  Mother.  Social History Children  2 Current work status  retired Living situation  live alone Number of flights of stairs before winded  2-3 Tobacco / smoke exposure  03/17/2015: no Tobacco use  Former smoker.  03/17/2015  Medication History Atenolol (50MG  Tablet, Oral) Active. LORazepam (0.5MG  Tablet, Oral) Active.  Past Surgical History Breast Biopsy  bilateral Cataract Surgery  bilateral Colon Polyp Removal - Colonoscopy  Colon Polyp Removal - Open  Hysterectomy  complete (cancerous) Mastectomy - Bilateral  bilateral  Review of Systems  General Not Present- Chills, Fatigue, Fever, Memory Loss, Night Sweats, Weight Gain and Weight Loss. Skin Not Present- Eczema, Hives, Itching, Lesions and Rash. HEENT Not Present- Dentures, Double Vision, Headache, Hearing Loss, Tinnitus and Visual Loss. Respiratory Not Present- Allergies, Chronic Cough, Coughing up blood, Shortness of breath at rest and Shortness of breath with exertion. Cardiovascular Not Present- Chest Pain, Difficulty Breathing Lying Down, Murmur, Palpitations, Racing/skipping heartbeats and Swelling. Gastrointestinal Present- Constipation and Heartburn. Not Present- Abdominal Pain, Bloody Stool, Diarrhea, Difficulty Swallowing, Jaundice, Loss of appetitie, Nausea and Vomiting. Female Genitourinary Not Present- Blood in Urine, Discharge, Flank Pain, Incontinence, Painful Urination, Urgency, Urinary frequency, Urinary Retention, Urinating at Night and Weak urinary stream. Musculoskeletal Present- Back Pain and Joint Pain. Not Present- Joint Swelling, Morning Stiffness, Muscle Pain, Muscle Weakness and Spasms. Neurological Not Present- Blackout spells, Difficulty with balance, Dizziness, Paralysis, Tremor and Weakness. Psychiatric Not Present- Insomnia.  Vitals  Weight: 164 lb Height: 62in Body Surface Area: 1.76 m Body Mass Index: 30 kg/m  Pulse: 60 (Regular)   Physical Exam  General Mental Status -Alert, cooperative and good historian. General Appearance-pleasant, Not in acute distress. Orientation-Oriented X3. Build & Nutrition-Well nourished and Well developed.  Head and Neck Head-normocephalic,  atraumatic . Neck Global Assessment - supple, no bruit auscultated on the right, no bruit auscultated on the left.  Eye Pupil - Bilateral-Regular and Round. Motion - Bilateral-EOMI.  ENMT Note: upper and lower denture plates   Chest and Lung Exam Auscultation Breath sounds - clear at anterior chest wall and clear at posterior chest wall. Adventitious sounds - No Adventitious sounds.  Cardiovascular Auscultation Rhythm - Regular rate and rhythm. Heart Sounds - S1 WNL and S2 WNL. Murmurs & Other Heart Sounds - Auscultation of the heart reveals - No Murmurs.  Abdomen Palpation/Percussion Tenderness - Abdomen is non-tender to palpation. Rigidity (guarding) - Abdomen is soft. Auscultation Auscultation of the abdomen reveals - Bowel sounds normal.  Female Genitourinary Note: Not done, not pertinent to present illness   Musculoskeletal Note: Well-developed female, alert and oriented, in no apparent distress. Evaluation of her left hip flexion about 110, minimal internal rotation, about 20 to 30 of external rotation, 30 of abduction. Right hip flexion 90, no internal or external rotation, only about 5 degrees of abduction. Her gait pattern is significantly antalgic on the right side.  RADIOGRAPHS AP pelvis and lateral of the right hip severe end stage arthritis right hip, bone on bone throughout, large subchondral cystic formation. Left hip has mild changes, but nowhere near as bad as the right.   Assessment & Plan Primary osteoarthritis of right hip (M16.11)  Note:Surgical Plans: Right Total Hip Replacement - Anterior Approach  Disposition: Home versus SNF. Will try and arrange for 24/7 care at home.  PCP: Dr. Karie Kirks - Patient has been seen preoperatively and felt to be stable for surgery.  Topical TXA - Breast Cancer  Anesthesia Issues: None   AVOID BOTH ARMS FOR IV'S AND BP'S  BUT MAY DO WRIST CUFF ON THE RIGHT WRIST SHE DOES HAVE PORT ON UPPER RIGHT CHEST  WALL  Signed electronically by Ok Edwards, III PA-C

## 2015-08-04 NOTE — Anesthesia Preprocedure Evaluation (Addendum)
Anesthesia Evaluation  Patient identified by MRN, date of birth, ID band Patient awake    Reviewed: Allergy & Precautions, H&P , NPO status , Patient's Chart, lab work & pertinent test results, reviewed documented beta blocker date and time   Airway Mallampati: II  TM Distance: >3 FB Neck ROM: full    Dental  (+) Dental Advisory Given, Edentulous Upper, Missing   Pulmonary neg pulmonary ROS, former smoker,    Pulmonary exam normal breath sounds clear to auscultation       Cardiovascular Exercise Tolerance: Good hypertension, negative cardio ROS Normal cardiovascular exam Rhythm:regular Rate:Normal     Neuro/Psych negative neurological ROS  negative psych ROS   GI/Hepatic negative GI ROS, Neg liver ROS,   Endo/Other  negative endocrine ROS  Renal/GU negative Renal ROS  negative genitourinary   Musculoskeletal   Abdominal   Peds  Hematology negative hematology ROS (+)   Anesthesia Other Findings Bilateral breast cancer  Reproductive/Obstetrics negative OB ROS                            Anesthesia Physical Anesthesia Plan  ASA: III  Anesthesia Plan: General   Post-op Pain Management:    Induction: Intravenous  Airway Management Planned: Oral ETT  Additional Equipment:   Intra-op Plan:   Post-operative Plan: Extubation in OR  Informed Consent: I have reviewed the patients History and Physical, chart, labs and discussed the procedure including the risks, benefits and alternatives for the proposed anesthesia with the patient or authorized representative who has indicated his/her understanding and acceptance.   Dental Advisory Given  Plan Discussed with: CRNA  Anesthesia Plan Comments:        Anesthesia Quick Evaluation

## 2015-08-05 ENCOUNTER — Inpatient Hospital Stay (HOSPITAL_COMMUNITY): Payer: Medicare Other

## 2015-08-05 ENCOUNTER — Encounter (HOSPITAL_COMMUNITY)
Admission: RE | Disposition: A | Discharge status: Home Health | Payer: Self-pay | Source: Ambulatory Visit | Attending: Orthopedic Surgery

## 2015-08-05 ENCOUNTER — Inpatient Hospital Stay (HOSPITAL_COMMUNITY): Payer: Medicare Other | Admitting: Anesthesiology

## 2015-08-05 ENCOUNTER — Inpatient Hospital Stay (HOSPITAL_COMMUNITY)
Admission: RE | Admit: 2015-08-05 | Discharge: 2015-08-07 | DRG: 470 | Disposition: A | Discharge status: Home Health | Payer: Medicare Other | Source: Ambulatory Visit | Attending: Orthopedic Surgery | Admitting: Orthopedic Surgery

## 2015-08-05 ENCOUNTER — Encounter (HOSPITAL_COMMUNITY): Payer: Self-pay

## 2015-08-05 DIAGNOSIS — Z85828 Personal history of other malignant neoplasm of skin: Secondary | ICD-10-CM | POA: Diagnosis not present

## 2015-08-05 DIAGNOSIS — Z853 Personal history of malignant neoplasm of breast: Secondary | ICD-10-CM | POA: Diagnosis not present

## 2015-08-05 DIAGNOSIS — E7800 Pure hypercholesterolemia, unspecified: Secondary | ICD-10-CM | POA: Diagnosis present

## 2015-08-05 DIAGNOSIS — I10 Essential (primary) hypertension: Secondary | ICD-10-CM | POA: Diagnosis present

## 2015-08-05 DIAGNOSIS — M169 Osteoarthritis of hip, unspecified: Secondary | ICD-10-CM

## 2015-08-05 DIAGNOSIS — Z96649 Presence of unspecified artificial hip joint: Secondary | ICD-10-CM

## 2015-08-05 DIAGNOSIS — M1611 Unilateral primary osteoarthritis, right hip: Principal | ICD-10-CM | POA: Diagnosis present

## 2015-08-05 HISTORY — PX: TOTAL HIP ARTHROPLASTY: SHX124

## 2015-08-05 LAB — URINALYSIS, ROUTINE W REFLEX MICROSCOPIC
BILIRUBIN URINE: NEGATIVE
Glucose, UA: NEGATIVE mg/dL
HGB URINE DIPSTICK: NEGATIVE
Ketones, ur: NEGATIVE mg/dL
Leukocytes, UA: NEGATIVE
NITRITE: NEGATIVE
PH: 6.5 (ref 5.0–8.0)
Protein, ur: NEGATIVE mg/dL
SPECIFIC GRAVITY, URINE: 1.019 (ref 1.005–1.030)

## 2015-08-05 LAB — TYPE AND SCREEN
ABO/RH(D): A POS
Antibody Screen: NEGATIVE

## 2015-08-05 SURGERY — ARTHROPLASTY, HIP, TOTAL, ANTERIOR APPROACH
Anesthesia: General | Site: Hip | Laterality: Right

## 2015-08-05 MED ORDER — MIDAZOLAM HCL 2 MG/2ML IJ SOLN
INTRAMUSCULAR | Status: AC
  Filled 2015-08-05: qty 2

## 2015-08-05 MED ORDER — POLYETHYLENE GLYCOL 3350 17 G PO PACK: 17.0000 g | PACK | Freq: Every day | ORAL | Status: DC | PRN

## 2015-08-05 MED ORDER — TRANEXAMIC ACID 1000 MG/10ML IV SOLN
2000.0000 mg | Freq: Once | INTRAVENOUS | Status: DC
  Filled 2015-08-05: qty 20

## 2015-08-05 MED ORDER — CHLORHEXIDINE GLUCONATE 4 % EX LIQD: 60.0000 mL | Freq: Once | CUTANEOUS | Status: DC

## 2015-08-05 MED ORDER — FENTANYL CITRATE (PF) 250 MCG/5ML IJ SOLN
INTRAMUSCULAR | Status: AC
  Filled 2015-08-05: qty 5

## 2015-08-05 MED ORDER — METOCLOPRAMIDE HCL 5 MG/ML IJ SOLN: 5.0000 mg | Freq: Three times a day (TID) | INTRAMUSCULAR | Status: DC | PRN

## 2015-08-05 MED ORDER — HYDROMORPHONE HCL 2 MG/ML IJ SOLN
INTRAMUSCULAR | Status: AC
  Filled 2015-08-05: qty 1

## 2015-08-05 MED ORDER — LACTATED RINGERS IV SOLN: INTRAVENOUS | Status: DC

## 2015-08-05 MED ORDER — OXYCODONE HCL 5 MG PO TABS
5.0000 mg | ORAL_TABLET | ORAL | Status: DC | PRN
  Administered 2015-08-05: 5 mg via ORAL
  Administered 2015-08-05 – 2015-08-07 (×7): 10 mg via ORAL
  Filled 2015-08-05 (×7): qty 2
  Filled 2015-08-05: qty 1

## 2015-08-05 MED ORDER — DEXAMETHASONE SODIUM PHOSPHATE 10 MG/ML IJ SOLN
INTRAMUSCULAR | Status: AC
  Filled 2015-08-05: qty 1

## 2015-08-05 MED ORDER — SODIUM CHLORIDE 0.9% FLUSH: 10.0000 mL | INTRAVENOUS | Status: DC | PRN

## 2015-08-05 MED ORDER — ACETAMINOPHEN 10 MG/ML IV SOLN
INTRAVENOUS | Status: AC
Start: 2015-08-05 — End: 2015-08-05
  Filled 2015-08-05: qty 100

## 2015-08-05 MED ORDER — DOCUSATE SODIUM 100 MG PO CAPS
100.0000 mg | ORAL_CAPSULE | Freq: Two times a day (BID) | ORAL | Status: DC
  Administered 2015-08-05 – 2015-08-07 (×5): 100 mg via ORAL
  Filled 2015-08-05 (×5): qty 1

## 2015-08-05 MED ORDER — METOCLOPRAMIDE HCL 5 MG PO TABS: 5.0000 mg | ORAL_TABLET | Freq: Three times a day (TID) | ORAL | Status: DC | PRN

## 2015-08-05 MED ORDER — 0.9 % SODIUM CHLORIDE (POUR BTL) OPTIME
TOPICAL | Status: DC | PRN
  Administered 2015-08-05: 1000 mL

## 2015-08-05 MED ORDER — CEFAZOLIN SODIUM-DEXTROSE 2-4 GM/100ML-% IV SOLN
INTRAVENOUS | Status: AC
  Filled 2015-08-05: qty 100

## 2015-08-05 MED ORDER — ACETAMINOPHEN 10 MG/ML IV SOLN
1000.0000 mg | Freq: Once | INTRAVENOUS | Status: AC
  Administered 2015-08-05: 1000 mg via INTRAVENOUS

## 2015-08-05 MED ORDER — PROPOFOL 10 MG/ML IV BOLUS
INTRAVENOUS | Status: AC
  Filled 2015-08-05: qty 20

## 2015-08-05 MED ORDER — FLEET ENEMA 7-19 GM/118ML RE ENEM: 1.0000 | ENEMA | Freq: Once | RECTAL | Status: DC | PRN

## 2015-08-05 MED ORDER — DEXAMETHASONE SODIUM PHOSPHATE 10 MG/ML IJ SOLN
10.0000 mg | Freq: Once | INTRAMUSCULAR | Status: AC
  Administered 2015-08-06: 10 mg via INTRAVENOUS
  Filled 2015-08-05: qty 1

## 2015-08-05 MED ORDER — FENTANYL CITRATE (PF) 100 MCG/2ML IJ SOLN
INTRAMUSCULAR | Status: DC | PRN
  Administered 2015-08-05: 100 ug via INTRAVENOUS
  Administered 2015-08-05 (×3): 50 ug via INTRAVENOUS

## 2015-08-05 MED ORDER — METHOCARBAMOL 1000 MG/10ML IJ SOLN
500.0000 mg | Freq: Four times a day (QID) | INTRAVENOUS | Status: DC | PRN
  Administered 2015-08-05 (×2): 500 mg via INTRAVENOUS
  Filled 2015-08-05: qty 550
  Filled 2015-08-05 (×2): qty 5
  Filled 2015-08-05: qty 550

## 2015-08-05 MED ORDER — RIVAROXABAN 10 MG PO TABS
10.0000 mg | ORAL_TABLET | Freq: Every day | ORAL | Status: DC
  Administered 2015-08-06 – 2015-08-07 (×2): 10 mg via ORAL
  Filled 2015-08-05 (×2): qty 1

## 2015-08-05 MED ORDER — HYDRALAZINE HCL 20 MG/ML IJ SOLN
INTRAMUSCULAR | Status: DC | PRN
  Administered 2015-08-05: 5 mg via INTRAVENOUS

## 2015-08-05 MED ORDER — PHENOL 1.4 % MT LIQD: 1.0000 | OROMUCOSAL | Status: DC | PRN

## 2015-08-05 MED ORDER — TRANEXAMIC ACID 1000 MG/10ML IV SOLN
INTRAVENOUS | Status: DC | PRN
  Administered 2015-08-05: 2000 mg via TOPICAL

## 2015-08-05 MED ORDER — SODIUM CHLORIDE 0.9 % IJ SOLN
INTRAMUSCULAR | Status: AC
  Filled 2015-08-05: qty 10

## 2015-08-05 MED ORDER — ONDANSETRON HCL 4 MG/2ML IJ SOLN
INTRAMUSCULAR | Status: DC | PRN
  Administered 2015-08-05: 4 mg via INTRAVENOUS

## 2015-08-05 MED ORDER — ATENOLOL 25 MG PO TABS
50.0000 mg | ORAL_TABLET | Freq: Every day | ORAL | Status: DC
  Administered 2015-08-06 – 2015-08-07 (×2): 50 mg via ORAL
  Filled 2015-08-05 (×3): qty 2

## 2015-08-05 MED ORDER — DIPHENHYDRAMINE HCL 12.5 MG/5ML PO ELIX: 12.5000 mg | ORAL_SOLUTION | ORAL | Status: DC | PRN

## 2015-08-05 MED ORDER — HYDROMORPHONE HCL 1 MG/ML IJ SOLN
0.2500 mg | INTRAMUSCULAR | Status: DC | PRN
  Administered 2015-08-05: 0.5 mg via INTRAVENOUS

## 2015-08-05 MED ORDER — MORPHINE SULFATE (PF) 2 MG/ML IV SOLN: 1.0000 mg | INTRAVENOUS | Status: DC | PRN

## 2015-08-05 MED ORDER — ACETAMINOPHEN 500 MG PO TABS
1000.0000 mg | ORAL_TABLET | Freq: Four times a day (QID) | ORAL | Status: DC
  Administered 2015-08-05 – 2015-08-06 (×3): 1000 mg via ORAL
  Filled 2015-08-05 (×3): qty 2

## 2015-08-05 MED ORDER — ACETAMINOPHEN 325 MG PO TABS: 650.0000 mg | ORAL_TABLET | Freq: Four times a day (QID) | ORAL | Status: DC | PRN

## 2015-08-05 MED ORDER — DEXAMETHASONE SODIUM PHOSPHATE 10 MG/ML IJ SOLN
10.0000 mg | Freq: Once | INTRAMUSCULAR | Status: AC
  Administered 2015-08-05: 10 mg via INTRAVENOUS

## 2015-08-05 MED ORDER — ONDANSETRON HCL 4 MG/2ML IJ SOLN: 4.0000 mg | Freq: Four times a day (QID) | INTRAMUSCULAR | Status: DC | PRN

## 2015-08-05 MED ORDER — MENTHOL 3 MG MT LOZG
1.0000 | LOZENGE | OROMUCOSAL | Status: DC | PRN
Start: 2015-08-05 — End: 2015-08-07
  Administered 2015-08-06: 3 mg via ORAL
  Filled 2015-08-05: qty 9

## 2015-08-05 MED ORDER — ONDANSETRON HCL 4 MG/2ML IJ SOLN
INTRAMUSCULAR | Status: AC
  Filled 2015-08-05: qty 2

## 2015-08-05 MED ORDER — LACTATED RINGERS IV SOLN
INTRAVENOUS | Status: DC | PRN
  Administered 2015-08-05 (×2): via INTRAVENOUS

## 2015-08-05 MED ORDER — BUPIVACAINE HCL (PF) 0.25 % IJ SOLN
INTRAMUSCULAR | Status: DC | PRN
  Administered 2015-08-05: 30 mL

## 2015-08-05 MED ORDER — SODIUM CHLORIDE 0.9 % IV SOLN
INTRAVENOUS | Status: DC
  Administered 2015-08-05 – 2015-08-06 (×2): via INTRAVENOUS

## 2015-08-05 MED ORDER — TRAMADOL HCL 50 MG PO TABS
50.0000 mg | ORAL_TABLET | Freq: Four times a day (QID) | ORAL | Status: DC | PRN
Start: 2015-08-05 — End: 2015-08-07
  Administered 2015-08-06: 50 mg via ORAL
  Filled 2015-08-05: qty 1

## 2015-08-05 MED ORDER — HYDROMORPHONE HCL 1 MG/ML IJ SOLN
INTRAMUSCULAR | Status: AC
  Filled 2015-08-05: qty 1

## 2015-08-05 MED ORDER — CEFAZOLIN SODIUM-DEXTROSE 2-4 GM/100ML-% IV SOLN
2.0000 g | Freq: Four times a day (QID) | INTRAVENOUS | Status: AC
  Administered 2015-08-05 (×2): 2 g via INTRAVENOUS
  Filled 2015-08-05: qty 100

## 2015-08-05 MED ORDER — HYDROMORPHONE HCL 1 MG/ML IJ SOLN
INTRAMUSCULAR | Status: DC | PRN
  Administered 2015-08-05: 0.5 mg via INTRAVENOUS
  Administered 2015-08-05: 1 mg via INTRAVENOUS
  Administered 2015-08-05: 0.5 mg via INTRAVENOUS

## 2015-08-05 MED ORDER — BUPIVACAINE HCL (PF) 0.25 % IJ SOLN
INTRAMUSCULAR | Status: AC
Start: 2015-08-05 — End: 2015-08-05
  Filled 2015-08-05: qty 30

## 2015-08-05 MED ORDER — SUCCINYLCHOLINE CHLORIDE 20 MG/ML IJ SOLN
INTRAMUSCULAR | Status: DC | PRN
  Administered 2015-08-05: 100 mg via INTRAVENOUS

## 2015-08-05 MED ORDER — METHOCARBAMOL 500 MG PO TABS
500.0000 mg | ORAL_TABLET | Freq: Four times a day (QID) | ORAL | Status: DC | PRN
  Administered 2015-08-06 (×2): 500 mg via ORAL
  Filled 2015-08-05 (×2): qty 1

## 2015-08-05 MED ORDER — ACETAMINOPHEN 650 MG RE SUPP: 650.0000 mg | Freq: Four times a day (QID) | RECTAL | Status: DC | PRN

## 2015-08-05 MED ORDER — PROPOFOL 10 MG/ML IV BOLUS
INTRAVENOUS | Status: DC | PRN
  Administered 2015-08-05: 120 mg via INTRAVENOUS

## 2015-08-05 MED ORDER — HYDRALAZINE HCL 20 MG/ML IJ SOLN
INTRAMUSCULAR | Status: AC
  Filled 2015-08-05: qty 1

## 2015-08-05 MED ORDER — MIDAZOLAM HCL 5 MG/5ML IJ SOLN
INTRAMUSCULAR | Status: DC | PRN
  Administered 2015-08-05: 2 mg via INTRAVENOUS

## 2015-08-05 MED ORDER — CEFAZOLIN SODIUM-DEXTROSE 2-4 GM/100ML-% IV SOLN
2.0000 g | INTRAVENOUS | Status: AC
  Administered 2015-08-05: 2 g via INTRAVENOUS

## 2015-08-05 MED ORDER — ONDANSETRON HCL 4 MG PO TABS: 4.0000 mg | ORAL_TABLET | Freq: Four times a day (QID) | ORAL | Status: DC | PRN

## 2015-08-05 MED ORDER — BISACODYL 10 MG RE SUPP: 10.0000 mg | Freq: Every day | RECTAL | Status: DC | PRN

## 2015-08-05 MED ORDER — STERILE WATER FOR IRRIGATION IR SOLN
Status: DC | PRN
  Administered 2015-08-05: 3000 mL

## 2015-08-05 SURGICAL SUPPLY — 34 items
BAG DECANTER FOR FLEXI CONT (MISCELLANEOUS) ×2 IMPLANT
BAG SPEC THK2 15X12 ZIP CLS (MISCELLANEOUS)
BAG ZIPLOCK 12X15 (MISCELLANEOUS) IMPLANT
BLADE SAG 18X100X1.27 (BLADE) ×2 IMPLANT
CAPT HIP TOTAL 2 ×1 IMPLANT
CLOTH BEACON ORANGE TIMEOUT ST (SAFETY) ×2 IMPLANT
COVER PERINEAL POST (MISCELLANEOUS) ×2 IMPLANT
DECANTER SPIKE VIAL GLASS SM (MISCELLANEOUS) ×2 IMPLANT
DRAPE STERI IOBAN 125X83 (DRAPES) ×2 IMPLANT
DRAPE U-SHAPE 47X51 STRL (DRAPES) ×4 IMPLANT
DRSG ADAPTIC 3X8 NADH LF (GAUZE/BANDAGES/DRESSINGS) ×2 IMPLANT
DRSG MEPILEX BORDER 4X4 (GAUZE/BANDAGES/DRESSINGS) ×2 IMPLANT
DRSG MEPILEX BORDER 4X8 (GAUZE/BANDAGES/DRESSINGS) ×2 IMPLANT
DURAPREP 26ML APPLICATOR (WOUND CARE) ×2 IMPLANT
ELECT REM PT RETURN 9FT ADLT (ELECTROSURGICAL) ×2
ELECTRODE REM PT RTRN 9FT ADLT (ELECTROSURGICAL) ×1 IMPLANT
EVACUATOR 1/8 PVC DRAIN (DRAIN) ×2 IMPLANT
GLOVE BIO SURGEON STRL SZ7.5 (GLOVE) ×2 IMPLANT
GLOVE BIO SURGEON STRL SZ8 (GLOVE) ×4 IMPLANT
GLOVE BIOGEL PI IND STRL 8 (GLOVE) ×2 IMPLANT
GLOVE BIOGEL PI INDICATOR 8 (GLOVE) ×6
GOWN STRL REUS W/TWL LRG LVL3 (GOWN DISPOSABLE) ×4 IMPLANT
GOWN STRL REUS W/TWL XL LVL3 (GOWN DISPOSABLE) ×2 IMPLANT
PACK ANTERIOR HIP CUSTOM (KITS) ×2 IMPLANT
STRIP CLOSURE SKIN 1/2X4 (GAUZE/BANDAGES/DRESSINGS) ×2 IMPLANT
SUT ETHIBOND NAB CT1 #1 30IN (SUTURE) ×2 IMPLANT
SUT MNCRL AB 4-0 PS2 18 (SUTURE) ×2 IMPLANT
SUT VIC AB 2-0 CT1 27 (SUTURE) ×4
SUT VIC AB 2-0 CT1 TAPERPNT 27 (SUTURE) ×2 IMPLANT
SUT VLOC 180 0 24IN GS25 (SUTURE) ×2 IMPLANT
SYR 50ML LL SCALE MARK (SYRINGE) ×1 IMPLANT
TRAY FOLEY W/METER SILVER 14FR (SET/KITS/TRAYS/PACK) ×2 IMPLANT
TRAY FOLEY W/METER SILVER 16FR (SET/KITS/TRAYS/PACK) ×1 IMPLANT
YANKAUER SUCT BULB TIP 10FT TU (MISCELLANEOUS) ×2 IMPLANT

## 2015-08-05 NOTE — Anesthesia Postprocedure Evaluation (Signed)
Anesthesia Post Note  Patient: Tiffany Sweeney  Procedure(s) Performed: Procedure(s) (LRB): RIGHT TOTAL HIP ARTHROPLASTY ANTERIOR APPROACH (Right)  Patient location during evaluation: PACU Anesthesia Type: General Level of consciousness: awake and alert Pain management: pain level controlled Vital Signs Assessment: post-procedure vital signs reviewed and stable Respiratory status: spontaneous breathing, nonlabored ventilation, respiratory function stable and patient connected to nasal cannula oxygen Cardiovascular status: blood pressure returned to baseline and stable Postop Assessment: no signs of nausea or vomiting Anesthetic complications: no    Last Vitals:  Vitals:   08/05/15 1129 08/05/15 1300  BP: 140/81 (!) 146/73  Pulse: (!) 58 (!) 50  Resp: 12   Temp:      Last Pain:  Vitals:   08/05/15 1130  TempSrc:   PainSc: 0-No pain                 Donelle Baba L

## 2015-08-05 NOTE — Interval H&P Note (Signed)
History and Physical Interval Note:  08/05/2015 7:49 AM  Tiffany Sweeney  has presented today for surgery, with the diagnosis of right hip osteoarthritis  The various methods of treatment have been discussed with the patient and family. After consideration of risks, benefits and other options for treatment, the patient has consented to  Procedure(s): RIGHT TOTAL HIP ARTHROPLASTY ANTERIOR APPROACH (Right) as a surgical intervention .  The patient's history has been reviewed, patient examined, no change in status, stable for surgery.  I have reviewed the patient's chart and labs.  Questions were answered to the patient's satisfaction.     Gearlean Alf

## 2015-08-05 NOTE — Addendum Note (Signed)
Addendum  created 08/05/15 1434 by Anne Fu, CRNA   Anesthesia Intra Meds edited

## 2015-08-05 NOTE — H&P (View-Only) (Signed)
Tiffany Sweeney DOB: [birth year] Widowed / Language: Cleophus Molt / Race: White Female Date of Admission:  08/05/2015 CC:  Right Hip Pain History of Present Illness The patient is a 81 year old female who comes in for a preoperative History and Physical. The patient is scheduled for a right total hip arthroplasty (anterior) to be performed by Dr. Dione Plover. Aluisio, MD at [facility] on 08/05/2015. The patient is a 81 year old female who presented with a hip problem. The patient reports right hip problems including pain (Pt. right leg can not get straight/it turns outwards), weakness, giving way, catching and pt. has a hard time rising from a chair/going up stairs symptoms that have been present for 2 year(s). The symptoms began without any known injury. Symptoms reported include hip pain, pain with weightbearing and difficulty bearing weight The patient reports symptoms radiating to the: right groin. The patient describes the hip problem as aching.The symptoms are described as moderate in severity.The patient feels as if their symptoms are does feel they are worsening. Current treatment includes application of heat. Previous workup for this problem has included bone scan (bone sacn 2017). Previous treatment for this problem has included physical therapy (for back issues/caused right hip pain after PT 2 year ago). Right hip is getting progressively worse over time. She is having pain, but worse than the pain are the functional deficits that she is having. She has lost a lot of motion in that hip over time. She can no longer bend down to tie her shoes or cannot get in and out of a chair or car easily. She is having pain associated with this, but the pain is not constant or intolerable at this time. The functional difficulties are more intolerable for her. Left side bothers her a little bit, nowhere near as bad as the right. Her hip got worse and that she was doing physical therapy for her back.  AP pelvis  and lateral of the right hip severe end stage arthritis right hip, bone on bone throughout, large subchondral cystic formation. Left hip has mild changes, but nowhere near as bad as the right. At this point, the most predictable means of improving pain and function is total hip arthroplasty. The procedure, risks, potential complications and rehab course are discussed in detail and the patient elects to proceed. She has got advanced end stage arthritis right hip with progressively worsening dysfunction as well as pain associated with it. Function is her big problem and the most predictable means of improving her function at this point would be a total hip arthroplasty. We did discuss that in detail and she would like to go ahead and proceed. They have been treated conservatively in the past for the above stated problem and despite conservative measures, they continue to have progressive pain and severe functional limitations and dysfunction. They have failed non-operative management including home exercise, medications. It is felt that they would benefit from undergoing total joint replacement. Risks and benefits of the procedure have been discussed with the patient and they elect to proceed with surgery. There are no active contraindications to surgery such as ongoing infection or rapidly progressive neurological disease.   Problem List/Past Medical Primary osteoarthritis of right hip (M16.11)  Anxiety Disorder  Breast Cancer  Bilateral Skin Cancer  Hypertension  Hypercholesterolemia  Jaundice  Childhood Years  Allergies  No Known Drug Allergies  Family History  Cancer  Brother, Mother, Sister. Diabetes Mellitus  Mother. Heart Disease  Father. Hypertension  Mother.  Social History Children  2 Current work status  retired Living situation  live alone Number of flights of stairs before winded  2-3 Tobacco / smoke exposure  03/17/2015: no Tobacco use  Former smoker.  03/17/2015  Medication History Atenolol (50MG  Tablet, Oral) Active. LORazepam (0.5MG  Tablet, Oral) Active.  Past Surgical History Breast Biopsy  bilateral Cataract Surgery  bilateral Colon Polyp Removal - Colonoscopy  Colon Polyp Removal - Open  Hysterectomy  complete (cancerous) Mastectomy - Bilateral  bilateral  Review of Systems  General Not Present- Chills, Fatigue, Fever, Memory Loss, Night Sweats, Weight Gain and Weight Loss. Skin Not Present- Eczema, Hives, Itching, Lesions and Rash. HEENT Not Present- Dentures, Double Vision, Headache, Hearing Loss, Tinnitus and Visual Loss. Respiratory Not Present- Allergies, Chronic Cough, Coughing up blood, Shortness of breath at rest and Shortness of breath with exertion. Cardiovascular Not Present- Chest Pain, Difficulty Breathing Lying Down, Murmur, Palpitations, Racing/skipping heartbeats and Swelling. Gastrointestinal Present- Constipation and Heartburn. Not Present- Abdominal Pain, Bloody Stool, Diarrhea, Difficulty Swallowing, Jaundice, Loss of appetitie, Nausea and Vomiting. Female Genitourinary Not Present- Blood in Urine, Discharge, Flank Pain, Incontinence, Painful Urination, Urgency, Urinary frequency, Urinary Retention, Urinating at Night and Weak urinary stream. Musculoskeletal Present- Back Pain and Joint Pain. Not Present- Joint Swelling, Morning Stiffness, Muscle Pain, Muscle Weakness and Spasms. Neurological Not Present- Blackout spells, Difficulty with balance, Dizziness, Paralysis, Tremor and Weakness. Psychiatric Not Present- Insomnia.  Vitals  Weight: 164 lb Height: 62in Body Surface Area: 1.76 m Body Mass Index: 30 kg/m  Pulse: 60 (Regular)   Physical Exam  General Mental Status -Alert, cooperative and good historian. General Appearance-pleasant, Not in acute distress. Orientation-Oriented X3. Build & Nutrition-Well nourished and Well developed.  Head and Neck Head-normocephalic,  atraumatic . Neck Global Assessment - supple, no bruit auscultated on the right, no bruit auscultated on the left.  Eye Pupil - Bilateral-Regular and Round. Motion - Bilateral-EOMI.  ENMT Note: upper and lower denture plates   Chest and Lung Exam Auscultation Breath sounds - clear at anterior chest wall and clear at posterior chest wall. Adventitious sounds - No Adventitious sounds.  Cardiovascular Auscultation Rhythm - Regular rate and rhythm. Heart Sounds - S1 WNL and S2 WNL. Murmurs & Other Heart Sounds - Auscultation of the heart reveals - No Murmurs.  Abdomen Palpation/Percussion Tenderness - Abdomen is non-tender to palpation. Rigidity (guarding) - Abdomen is soft. Auscultation Auscultation of the abdomen reveals - Bowel sounds normal.  Female Genitourinary Note: Not done, not pertinent to present illness   Musculoskeletal Note: Well-developed female, alert and oriented, in no apparent distress. Evaluation of her left hip flexion about 110, minimal internal rotation, about 20 to 30 of external rotation, 30 of abduction. Right hip flexion 90, no internal or external rotation, only about 5 degrees of abduction. Her gait pattern is significantly antalgic on the right side.  RADIOGRAPHS AP pelvis and lateral of the right hip severe end stage arthritis right hip, bone on bone throughout, large subchondral cystic formation. Left hip has mild changes, but nowhere near as bad as the right.   Assessment & Plan Primary osteoarthritis of right hip (M16.11)  Note:Surgical Plans: Right Total Hip Replacement - Anterior Approach  Disposition: Home versus SNF. Will try and arrange for 24/7 care at home.  PCP: Dr. Karie Kirks - Patient has been seen preoperatively and felt to be stable for surgery.  Topical TXA - Breast Cancer  Anesthesia Issues: None   AVOID BOTH ARMS FOR IV'S AND BP'S  BUT MAY DO WRIST CUFF ON THE RIGHT WRIST SHE DOES HAVE PORT ON UPPER RIGHT CHEST  WALL  Signed electronically by Ok Edwards, III PA-C

## 2015-08-05 NOTE — Transfer of Care (Signed)
Immediate Anesthesia Transfer of Care Note  Patient: Tiffany Sweeney  Procedure(s) Performed: Procedure(s): RIGHT TOTAL HIP ARTHROPLASTY ANTERIOR APPROACH (Right)  Patient Location: PACU  Anesthesia Type:General  Level of Consciousness:  sedated, patient cooperative and responds to stimulation  Airway & Oxygen Therapy:Patient Spontanous Breathing and Patient connected to face mask oxgen  Post-op Assessment:  Report given to PACU RN and Post -op Vital signs reviewed and stable  Post vital signs:  Reviewed and stable  Last Vitals:  Vitals:   08/05/15 0629  BP: (!) 181/82  Pulse: (!) 53  Resp: 18  Temp: 123XX123 C    Complications: No apparent anesthesia complications

## 2015-08-05 NOTE — Op Note (Signed)
OPERATIVE REPORT  PREOPERATIVE DIAGNOSIS: Osteoarthritis of the Right hip.   POSTOPERATIVE DIAGNOSIS: Osteoarthritis of the Right  hip.   PROCEDURE: Right total hip arthroplasty, anterior approach.   SURGEON: Gaynelle Arabian, MD   ASSISTANT: Claudell Kyle, PA-C  ANESTHESIA:  General  ESTIMATED BLOOD LOSS:-300 ml   DRAINS: Hemovac x1.   COMPLICATIONS: None   CONDITION: PACU - hemodynamically stable.   BRIEF CLINICAL NOTE: Tiffany Sweeney is a 81 y.o. female who has advanced end-  stage arthritis of their Right  hip with progressively worsening pain and  dysfunction.The patient has failed nonoperative management and presents for  total hip arthroplasty.   PROCEDURE IN DETAIL: After successful administration of spinal  anesthetic, the traction boots for the [facility] bed were placed on both  feet and the patient was placed onto the [facility] bed, boots placed into the leg  holders. The Right hip was then isolated from the perineum with plastic  drapes and prepped and draped in the usual sterile fashion. ASIS and  greater trochanter were marked and a oblique incision was made, starting  at about 1 cm lateral and 2 cm distal to the ASIS and coursing towards  the anterior cortex of the femur. The skin was cut with a 10 blade  through subcutaneous tissue to the level of the fascia overlying the  tensor fascia lata muscle. The fascia was then incised in line with the  incision at the junction of the anterior third and posterior 2/3rd. The  muscle was teased off the fascia and then the interval between the TFL  and the rectus was developed. The Hohmann retractor was then placed at  the top of the femoral neck over the capsule. The vessels overlying the  capsule were cauterized and the fat on top of the capsule was removed.  A Hohmann retractor was then placed anterior underneath the rectus  femoris to give exposure to the entire anterior capsule. A T-shaped  capsulotomy was  performed. The edges were tagged and the femoral head  was identified.       Osteophytes are removed off the superior acetabulum.  The femoral neck was then cut in situ with an oscillating saw. Traction  was then applied to the left lower extremity utilizing the [facility]  traction. The femoral head was then removed. Retractors were placed  around the acetabulum and then circumferential removal of the labrum was  performed. Osteophytes were also removed. Reaming starts at 45 mm to  medialize and  Increased in 2 mm increments to 47 mm. We reamed in  approximately 40 degrees of abduction, 20 degrees anteversion. A 48 mm  pinnacle acetabular shell was then impacted in anatomic position under  fluoroscopic guidance with excellent purchase. We did not need to place  any additional dome screws. A 28 mm neutral + 4 marathon liner was then  placed into the acetabular shell.       The femoral lift was then placed along the lateral aspect of the femur  just distal to the vastus ridge. The leg was  externally rotated and capsule  was stripped off the inferior aspect of the femoral neck down to the  level of the lesser trochanter, this was done with electrocautery. The femur was lifted after this was performed. The  leg was then placed in an extended and adducted position essentially delivering the femur. We also removed the capsule superiorly and the piriformis from the piriformis fossa to  gain excellent exposure of the  proximal femur. Rongeur was used to remove some cancellous bone to get  into the lateral portion of the proximal femur for placement of the  initial starter reamer. The starter broaches was placed  the starter broach  and was shown to go down the center of the canal. Broaching  with the  Corail system was then performed starting at size 8, coursing  Up to size 12. A size 12 had excellent torsional and rotational  and axial stability. The trial high offset neck was then placed  with a 28 +  1.5 trial head. The hip was then reduced. We confirmed that  the stem was in the canal both on AP and lateral x-rays. It also has excellent sizing. The hip was reduced with outstanding stability through full extension and full external rotation.. AP pelvis was taken and the leg lengths were measured and found to be equal. Hip was then dislocated again and the femoral head and neck removed. The  femoral broach was removed. Size 12 Corail stem with a high offset  neck was then impacted into the femur following native anteversion. Has  excellent purchase in the canal. Excellent torsional and rotational and  axial stability. It is confirmed to be in the canal on AP and lateral  fluoroscopic views. The 28 + 1.5 ceramic head was placed and the hip  reduced with outstanding stability. Again AP pelvis was taken and it  confirmed that the leg lengths were equal. The wound was then copiously  irrigated with saline solution and the capsule reattached and repaired  with Ethibond suture. 30 ml of .25% Bupivicaine was  injected into the capsule and into the edge of the tensor fascia lata as well as subcutaneous tissue. The fascia overlying the tensor fascia lata was then closed with a running #1 V-Loc. Subcu was closed with interrupted 2-0 Vicryl and subcuticular running 4-0 Monocryl. Incision was cleaned  and dried. Steri-Strips and a bulky sterile dressing applied. Hemovac  drain was hooked to suction and then the patient was awakened and transported to  recovery in stable condition.        Please note that a surgical assistant was a medical necessity for this procedure to perform it in a safe and expeditious manner. Assistant was necessary to provide appropriate retraction of vital neurovascular structures and to prevent femoral fracture and allow for anatomic placement of the prosthesis.  Gaynelle Arabian, M.D.

## 2015-08-05 NOTE — Anesthesia Procedure Notes (Signed)
Procedure Name: Intubation Date/Time: 08/05/2015 8:38 AM Performed by: Anne Fu Pre-anesthesia Checklist: Patient identified, Emergency Drugs available, Suction available, Patient being monitored and Timeout performed Patient Re-evaluated:Patient Re-evaluated prior to inductionOxygen Delivery Method: Circle system utilized Preoxygenation: Pre-oxygenation with 100% oxygen Intubation Type: IV induction Ventilation: Mask ventilation without difficulty Laryngoscope Size: Mac and 3 Grade View: Grade I Tube type: Oral Tube size: 7.5 mm Number of attempts: 1 Airway Equipment and Method: Stylet Placement Confirmation: ETT inserted through vocal cords under direct vision,  positive ETCO2,  CO2 detector and breath sounds checked- equal and bilateral Secured at: 21 cm Tube secured with: Tape Dental Injury: Teeth and Oropharynx as per pre-operative assessment

## 2015-08-06 LAB — BASIC METABOLIC PANEL WITH GFR
Anion gap: 6 (ref 5–15)
BUN: 11 mg/dL (ref 6–20)
CO2: 26 mmol/L (ref 22–32)
Calcium: 8.5 mg/dL — ABNORMAL LOW (ref 8.9–10.3)
Chloride: 108 mmol/L (ref 101–111)
Creatinine, Ser: 0.66 mg/dL (ref 0.44–1.00)
GFR calc Af Amer: >60 mL/min
GFR calc non Af Amer: >60 mL/min
Glucose, Bld: 166 mg/dL — ABNORMAL HIGH (ref 65–99)
Potassium: 3.4 mmol/L — ABNORMAL LOW (ref 3.5–5.1)
Sodium: 140 mmol/L (ref 135–145)

## 2015-08-06 LAB — CBC
HEMATOCRIT: 34.6 % — AB (ref 36.0–46.0)
HEMOGLOBIN: 11.8 g/dL — AB (ref 12.0–15.0)
MCH: 30.2 pg (ref 26.0–34.0)
MCHC: 34.1 g/dL (ref 30.0–36.0)
MCV: 88.5 fL (ref 78.0–100.0)
Platelets: 177 10*3/uL (ref 150–400)
RBC: 3.91 MIL/uL (ref 3.87–5.11)
RDW: 13.7 % (ref 11.5–15.5)
WBC: 11.5 10*3/uL — ABNORMAL HIGH (ref 4.0–10.5)

## 2015-08-06 MED ORDER — POTASSIUM CHLORIDE CRYS ER 20 MEQ PO TBCR
40.0000 meq | EXTENDED_RELEASE_TABLET | Freq: Once | ORAL | Status: AC
  Administered 2015-08-06: 40 meq via ORAL
  Filled 2015-08-06: qty 2

## 2015-08-06 NOTE — Evaluation (Signed)
Physical Therapy Evaluation Patient Details Name: Tiffany Sweeney MRN: [id number] DOB: 26-Jun-1934 Today's Date: 08/06/2015   History of Present Illness  s/p R DA THA; h/o bil breast CA and has port a cath  Clinical Impression  Pt s/p R THR presents with decreased R LE strength/ROM, deconditioned state and post op pain limiting functional mobility.  Pt hopes to progress to dc home with assist of dtr and follow up HHPT    Follow Up Recommendations Home health PT    Equipment Recommendations  None recommended by PT    Recommendations for Other Services OT consult     Precautions / Restrictions Precautions Precautions: Fall Precaution Comments: Pt has portacath Restrictions Weight Bearing Restrictions: No Other Position/Activity Restrictions: WBAT      Mobility  Bed Mobility Overal bed mobility: Needs Assistance;+2 for physical assistance Bed Mobility: Supine to Sit     Supine to sit: Mod assist;+2 for physical assistance;+2 for safety/equipment     General bed mobility comments: cues for sequence with assist to manage LEs and to bring trunk to upright .  Pt demonstrating significant posterior lean 2* limited flexion at hips  Transfers Overall transfer level: Needs assistance Equipment used: Rolling walker (2 wheeled) Transfers: Sit to/from Stand Sit to Stand: Mod assist;+2 physical assistance;+2 safety/equipment;From elevated surface         General transfer comment: cues for LE management and use of UEs to self assist.  Physical assist to bring weight up and fwd and to balance in initial standing  Ambulation/Gait Ambulation/Gait assistance: Min assist;Mod assist;+2 safety/equipment Ambulation Distance (Feet): 29 Feet Assistive device: Rolling walker (2 wheeled) Gait Pattern/deviations: Step-to pattern;Decreased step length - right;Decreased step length - left;Shuffle;Trunk flexed Gait velocity: decr Gait velocity interpretation: Below normal speed for  age/gender General Gait Details: cues for posture, sequence and position from [employer]            Wheelchair Mobility    Modified Rankin (Stroke Patients Only)       Balance Overall balance assessment: Needs assistance Sitting-balance support: Bilateral upper extremity supported;Feet supported Sitting balance-Leahy Scale: Fair     Standing balance support: Bilateral upper extremity supported Standing balance-Leahy Scale: Poor                               Pertinent Vitals/Pain Pain Assessment: 0-10 Pain Score: 6  Pain Location: R hip Pain Descriptors / Indicators: Aching;Sore Pain Intervention(s): Limited activity within patient's tolerance;Monitored during session;Repositioned;Ice applied    Home Living Family/patient expects to be discharged to:: Private residence Living Arrangements: Alone Available Help at Discharge: Family Type of Home: House Home Access: Stairs to enter Entrance Stairs-Rails: Right;Left;Can reach both Entrance Stairs-Number of Steps: 5 Home Layout: One level Home Equipment: Grab bars - toilet;Grab bars - tub/shower;Shower seat;Walker - 2 wheels Additional Comments: daughter will spend the night then friend will assist (in and out)    Prior Function Level of Independence: Independent               Hand Dominance        Extremity/Trunk Assessment   Upper Extremity Assessment: Generalized weakness (ROM WFLs)           Lower Extremity Assessment: Generalized weakness;RLE deficits/detail;LLE deficits/detail RLE Deficits / Details: Strengtha t hip 2+/5 with AAROM at hip to ~75 flex and 15 abd LLE Deficits / Details: AROM at hip to ~75 flex and 20 abd with strength  ~4-/5  Cervical / Trunk Assessment: Kyphotic  Communication   Communication: No difficulties  Cognition Arousal/Alertness: Awake/alert Behavior During Therapy: WFL for tasks assessed/performed Overall Cognitive Status: Within Functional Limits for  tasks assessed                      General Comments      Exercises Total Joint Exercises Ankle Circles/Pumps: AROM;Both;15 reps;Supine Quad Sets: AROM;Both;10 reps;Supine Heel Slides: AAROM;Right;20 reps;Supine Hip ABduction/ADduction: AAROM;Right;15 reps;Supine      Assessment/Plan    PT Assessment Patient needs continued PT services  PT Diagnosis Difficulty walking   PT Problem List Decreased strength;Decreased range of motion;Decreased activity tolerance;Decreased balance;Decreased mobility;Decreased knowledge of use of DME;Pain  PT Treatment Interventions DME instruction;Gait training;Stair training;Functional mobility training;Therapeutic activities;Therapeutic exercise;Patient/family education   PT Goals (Current goals can be found in the Care Plan section) Acute Rehab PT Goals Patient Stated Goal: Regain IND to return home PT Goal Formulation: With patient Time For Goal Achievement: 08/15/15 Potential to Achieve Goals: Fair    Frequency 7X/week   Barriers to discharge        Co-evaluation               End of Session Equipment Utilized During Treatment: Gait belt Activity Tolerance: Patient limited by fatigue;Patient limited by pain Patient left: in chair;with call bell/phone within reach;with chair alarm set Nurse Communication: Mobility status         Time: [id number] PT Time Calculation (min) (ACUTE ONLY): 47 min   Charges:   PT Evaluation $PT Eval Low Complexity: 1 Procedure PT Treatments $Gait Training: 8-22 mins $Therapeutic Exercise: 8-22 mins   PT G Codes:        Selden Noteboom 08/25/15, 12:49 PM

## 2015-08-06 NOTE — Progress Notes (Signed)
Physical Therapy Treatment Patient Details Name: Tiffany Sweeney MRN: [id number] DOB: 10-22-1934 Today's Date: 08/06/2015    History of Present Illness s/p R DA THA; h/o bil breast CA and has port a cath    PT Comments    Pt progressing with mobility but continues to experience difficulty with transitions.    Follow Up Recommendations  Home health PT     Equipment Recommendations  None recommended by PT    Recommendations for Other Services OT consult     Precautions / Restrictions Precautions Precautions: Fall Precaution Comments: Pt has portacath Restrictions Weight Bearing Restrictions: No Other Position/Activity Restrictions: WBAT    Mobility  Bed Mobility Overal bed mobility: Needs Assistance;+2 for physical assistance Bed Mobility: Supine to Sit     Supine to sit: Mod assist;+2 for physical assistance;+2 for safety/equipment     General bed mobility comments: Pt OOB and declines back to bed  Transfers Overall transfer level: Needs assistance Equipment used: Rolling walker (2 wheeled) Transfers: Sit to/from Stand Sit to Stand: Min assist;Mod assist;+2 physical assistance         General transfer comment: cues for LE management and use of UEs to self assist.  Physical assist to bring weight up and fwd and to balance in initial standing  Ambulation/Gait Ambulation/Gait assistance: Min assist Ambulation Distance (Feet): 98 Feet Assistive device: Rolling walker (2 wheeled) Gait Pattern/deviations: Step-to pattern;Step-through pattern;Decreased step length - right;Decreased step length - left;Shuffle;Trunk flexed Gait velocity: decr Gait velocity interpretation: Below normal speed for age/gender General Gait Details: cues for posture, sequence and position from RW.  Pt progressed to recip gait   Stairs            Wheelchair Mobility    Modified Rankin (Stroke Patients Only)       Balance Overall balance assessment: Needs  assistance Sitting-balance support: Bilateral upper extremity supported;Feet supported Sitting balance-Leahy Scale: Fair     Standing balance support: Bilateral upper extremity supported Standing balance-Leahy Scale: Poor                      Cognition Arousal/Alertness: Awake/alert Behavior During Therapy: WFL for tasks assessed/performed Overall Cognitive Status: Within Functional Limits for tasks assessed                      Exercises Total Joint Exercises Ankle Circles/Pumps: AROM;Both;15 reps;Supine Quad Sets: AROM;Both;10 reps;Supine Heel Slides: AAROM;Right;20 reps;Supine Hip ABduction/ADduction: AAROM;Right;15 reps;Supine    General Comments        Pertinent Vitals/Pain Pain Assessment: 0-10 Pain Score: 4  Pain Location: R hip Pain Descriptors / Indicators: Aching;Sore Pain Intervention(s): Limited activity within patient's tolerance;Monitored during session;Premedicated before session;Ice applied    Home Living Family/patient expects to be discharged to:: Private residence Living Arrangements: Alone Available Help at Discharge: Family Type of Home: House Home Access: Stairs to enter Entrance Stairs-Rails: Right;Left;Can reach both Home Layout: One level Home Equipment: Grab bars - toilet;Grab bars - tub/shower;Shower seat;Walker - 2 wheels Additional Comments: daughter will spend the night then friend will assist (in and out)    Prior Function Level of Independence: Independent          PT Goals (current goals can now be found in the care plan section) Acute Rehab PT Goals Patient Stated Goal: Regain IND to return home PT Goal Formulation: With patient Time For Goal Achievement: 08/15/15 Potential to Achieve Goals: Fair Progress towards PT goals: Progressing toward goals    Frequency  7X/week    PT Plan Current plan remains appropriate    Co-evaluation PT/OT/SLP Co-Evaluation/Treatment: Yes Reason for Co-Treatment: For  patient/therapist safety PT goals addressed during session: Mobility/safety with mobility OT goals addressed during session: ADL's and self-care     End of Session Equipment Utilized During Treatment: Gait belt Activity Tolerance: Patient tolerated treatment well Patient left: in chair;with call bell/phone within reach;with chair alarm set     Time: [id number] PT Time Calculation (min) (ACUTE ONLY): 29 min  Charges:  $Gait Training: 8-22 mins $Therapeutic Exercise: 8-22 mins                    G Codes:      Judene Logue 08/12/2015, 2:07 PM

## 2015-08-06 NOTE — Evaluation (Signed)
Occupational Therapy Evaluation Patient Details Name: Tiffany Sweeney MRN: [id number] DOB: Dec 12, 1934 Today's Date: 08/06/2015    History of Present Illness s/p R DA THA; h/o bil breast CA and has port a cath   Clinical Impression   This 81 year old female was admitted for the above sx. She will benefit from continued OT in acute setting to increase safety and independence with adls. Goals in acute are for min A. She currently needs min/mod +2 for sit to stand for transfers and adls    Follow Up Recommendations  Supervision/Assistance - 24 hour    Equipment Recommendations  None recommended by OT (has 19" toilet with grab bar/sink. Doesn't want 3:1)    Recommendations for Other Services       Precautions / Restrictions Precautions Precautions: Fall Restrictions Weight Bearing Restrictions: No Other Position/Activity Restrictions: WBAT      Mobility Bed Mobility               General bed mobility comments: Pt OOB and declines back to bed  Transfers Overall transfer level: Needs assistance Equipment used: Rolling walker (2 wheeled) Transfers: Sit to/from Stand Sit to Stand: Min assist;Mod assist;+2 physical assistance         General transfer comment: cues for LE management and use of UEs to self assist.  Physical assist to bring weight up and fwd and to balance in initial standing    Balance                                            ADL Overall ADL's : Needs assistance/impaired     Grooming: Set up;Sitting   Upper Body Bathing: Set up;Sitting   Lower Body Bathing: Moderate assistance;+2 for physical assistance;Sit to/from stand;With adaptive equipment   Upper Body Dressing : Set up;Sitting   Lower Body Dressing: Moderate assistance;Sit to/from stand;With adaptive equipment   Toilet Transfer: Moderate assistance;+2 for physical assistance;RW (back to chair with mod A +_2 to stand and min A for steps)             General  ADL Comments: Pt has been using AE prior to sx     Vision     Perception     Praxis      Pertinent Vitals/Pain Pain Assessment: 0-10 Pain Score: 4  Pain Location: R hip Pain Descriptors / Indicators: Aching;Sore Pain Intervention(s): Limited activity within patient's tolerance;Monitored during session;Premedicated before session;Ice applied     Hand Dominance     Extremity/Trunk Assessment Upper Extremity Assessment Upper Extremity Assessment: Generalized weakness (ROM WFLs)           Communication Communication Communication: No difficulties   Cognition Arousal/Alertness: Awake/alert Behavior During Therapy: WFL for tasks assessed/performed Overall Cognitive Status: Within Functional Limits for tasks assessed                     General Comments       Exercises       Shoulder Instructions      Home Living Family/patient expects to be discharged to:: Private residence Living Arrangements: Alone                 Bathroom Shower/Tub: Hospital doctor Toilet: Handicapped height     Home Equipment: Grab bars - toilet;Grab bars - tub/shower;Shower seat;Walker - 2 wheels   Additional Comments: daughter will  spend the night then friend will assist (in and out)      Prior Functioning/Environment Level of Independence: Independent             OT Diagnosis: Acute pain;Generalized weakness   OT Problem List: Decreased strength;Decreased activity tolerance;Pain;Decreased knowledge of use of DME or AE   OT Treatment/Interventions: Self-care/ADL training;DME and/or AE instruction;Patient/family education    OT Goals(Current goals can be found in the care plan section) Acute Rehab OT Goals Patient Stated Goal: Regain IND to return home OT Goal Formulation: With patient Time For Goal Achievement: 08/13/15 Potential to Achieve Goals: Good ADL Goals Pt Will Perform Lower Body Bathing: with min assist;with adaptive equipment;sit  to/from stand Pt Will Perform Lower Body Dressing: with min assist;with adaptive equipment;sit to/from stand Pt Will Transfer to Toilet: with min assist;ambulating;grab bars (19" commode) Pt Will Perform Toileting - Clothing Manipulation and hygiene: with min assist;sit to/from stand  OT Frequency: Min 2X/week   Barriers to D/C:            Co-evaluation   Reason for Co-Treatment: For patient/therapist safety PT goals addressed during session: Mobility/safety with mobility OT goals addressed during session: ADL's and self-care      End of Session    Activity Tolerance: Patient tolerated treatment well Patient left: in chair;with call bell/phone within reach   Time: [id number] (cotx) and [id number] OT Time Calculation (min): 31 min Charges:  OT General Charges $OT Visit: 1 Procedure OT Evaluation $OT Eval Low Complexity: 1 Procedure G-Codes:    Keddrick Wyne 08-30-15, 2:49 PM  Lesle Chris, OTR/L [phone] 08-30-2015

## 2015-08-06 NOTE — Progress Notes (Signed)
   Subjective: 1 Day Post-Op Procedure(s) (LRB): RIGHT TOTAL HIP ARTHROPLASTY ANTERIOR APPROACH (Right) Patient reports pain as mild.   Patient seen in rounds BY Dr. Wynelle Link. Patient is well, but has had some minor complaints of pain in the hip and thigh, requiring pain medications We will start therapy today.  Plan is to go Home after hospital stay.  Objective: Vital signs in last 24 hours: Temp:  [97.5 F (36.4 C)-98.4 F (36.9 C)] 97.8 F (36.6 C) (08/03 0454) Pulse Rate:  [50-67] 61 (08/03 0454) Resp:  [11-18] 18 (08/03 0454) BP: (140-166)/(72-86) 149/74 (08/03 0454) SpO2:  [98 %-100 %] 99 % (08/03 0454)  Intake/Output from previous day:  Intake/Output Summary (Last 24 hours) at 08/06/15 0712 Last data filed at 08/06/15 0455  Gross per 24 hour  Intake             2285 ml  Output             2535 ml  Net             -250 ml    Intake/Output this shift: No intake/output data recorded.  Labs:  Recent Labs  08/06/15 0442  HGB 11.8*    Recent Labs  08/06/15 0442  WBC 11.5*  RBC 3.91  HCT 34.6*  PLT 177    Recent Labs  08/06/15 0442  NA 140  K 3.4*  CL 108  CO2 26  BUN 11  CREATININE 0.66  GLUCOSE 166*  CALCIUM 8.5*   No results for input(s): LABPT, INR in the last 72 hours.  EXAM General - Patient is Alert, Appropriate and Oriented Extremity - Neurovascular intact Sensation intact distally Dorsiflexion/Plantar flexion intact Dressing - dressing C/D/I Motor Function - intact, moving foot and toes well on exam.  Hemovac pulled without difficulty.  Past Medical History:  Diagnosis Date  . Anxiety   . Arthritis   . Breast cancer (May)   . Cancer [facility])    breast- right  . Family history of colon cancer   . Family history of ovarian cancer   . Family history of pancreatic cancer   . High cholesterol   . Hypertension   . Skin cancer    Patient stated  she has low grade skin cancer in right hand    Assessment/Plan: 1 Day Post-Op  Procedure(s) (LRB): RIGHT TOTAL HIP ARTHROPLASTY ANTERIOR APPROACH (Right) Principal Problem:   OA (osteoarthritis) of hip  Estimated body mass index is 27.98 kg/m as calculated from the following:   Height as of this encounter: 5\' 4"  (1.626 m).   Weight as of this encounter: 73.9 kg (163 lb). Advance diet Up with therapy Plan for discharge tomorrow Discharge home with home health  DVT Prophylaxis - Xarelto Weight Bearing As Tolerated right Leg Hemovac Pulled Begin Therapy  Arlee Muslim, PA-C Orthopaedic Surgery 08/06/2015, 7:12 AM

## 2015-08-06 NOTE — Care Management Note (Signed)
Case Management Note  Patient Details  Name: Tiffany Sweeney MRN: [id number] Date of Birth: Apr 16, 1934  Subjective/Objective:                  RIGHT TOTAL HIP ARTHROPLASTY ANTERIOR APPROACH (Right) Action/Plan: Discharge planning Expected Discharge Date:  08/07/15               Expected Discharge Plan:  [place]  In-House Referral:     Discharge planning Services  CM Consult  Post Acute Care Choice:  Home Health Choice offered to:  Patient  DME Arranged:  N/A DME Agency:  NA  HH Arranged:  PT [place] Agency:  [facility] (formerly [employer])  Status of Service:  Completed, signed off  If discussed at [employer], dates discussed:    Additional Comments: CM met with pt in room to offer choice of home health agency.  Pt has family member who works for [employer] in [place] and chooses [facility] to render [place].  Referral given to [facility] rep, Tim.  Pt states she has all DME needed at home and denies need for additional DME.  No other CM needs were communicated. Dellie Catholic, RN 08/06/2015, 2:04 PM

## 2015-08-06 NOTE — Discharge Instructions (Addendum)
° °Dr. Frank Aluisio °Total Joint Specialist °[facility] °[address] °[place], [place] [address] °[phone] ° °ANTERIOR APPROACH TOTAL HIP REPLACEMENT POSTOPERATIVE DIRECTIONS ° ° °Hip Rehabilitation, Guidelines Following Surgery  °The results of a hip operation are greatly improved after range of motion and muscle strengthening exercises. Follow all safety measures which are given to protect your hip. If any of these exercises cause increased pain or swelling in your joint, decrease the amount until you are comfortable again. Then slowly increase the exercises. Call your caregiver if you have problems or questions.  ° °HOME CARE INSTRUCTIONS  °Remove items at home which could result in a fall. This includes throw rugs or furniture in walking pathways.  °· ICE to the affected hip every three hours for 30 minutes at a time and then as needed for pain and swelling.  Continue to use ice on the hip for pain and swelling from surgery. You may notice swelling that will progress down to the foot and ankle.  This is normal after surgery.  Elevate the leg when you are not up walking on it.   °· Continue to use the breathing machine which will help keep your temperature down.  It is common for your temperature to cycle up and down following surgery, especially at night when you are not up moving around and exerting yourself.  The breathing machine keeps your lungs expanded and your temperature down. ° ° °DIET °You may resume your previous home diet once your are discharged from the hospital. ° °DRESSING / WOUND CARE / SHOWERING °You may shower 3 days after surgery, but keep the wounds dry during showering.  You may use an occlusive plastic wrap (Press'n Seal for example), NO SOAKING/SUBMERGING IN THE BATHTUB.  If the bandage gets wet, change with a clean dry gauze.  If the incision gets wet, pat the wound dry with a clean towel. °You may start showering once you are discharged home but do not  submerge the incision under water. Just pat the incision dry and apply a dry gauze dressing on daily. °Change the surgical dressing daily and reapply a dry dressing each time. ° °ACTIVITY °Walk with your walker as instructed. °Use walker as long as suggested by your caregivers. °Avoid periods of inactivity such as sitting longer than an hour when not asleep. This helps prevent blood clots.  °You may resume a sexual relationship in one month or when given the OK by your doctor.  °You may return to work once you are cleared by your doctor.  °Do not drive a car for 6 weeks or until released by you surgeon.  °Do not drive while taking narcotics. ° °WEIGHT BEARING °Weight bearing as tolerated with assist device (walker, cane, etc) as directed, use it as long as suggested by your surgeon or therapist, typically at least 4-6 weeks. ° °POSTOPERATIVE CONSTIPATION PROTOCOL °Constipation - defined medically as fewer than three stools per week and severe constipation as less than one stool per week. ° °One of the most common issues patients have following surgery is constipation.  Even if you have a regular bowel pattern at home, your normal regimen is likely to be disrupted due to multiple reasons following surgery.  Combination of anesthesia, postoperative narcotics, change in appetite and fluid intake all can affect your bowels.  In order to avoid complications following surgery, here are some recommendations in order to help you during your recovery period. ° °Colace (docusate) - Pick up an over-the-counter   form of Colace or another stool softener and take twice a day as long as you are requiring postoperative pain medications.  Take with a full glass of water daily.  If you experience loose stools or diarrhea, hold the colace until you stool forms back up.  If your symptoms do not get better within 1 week or if they get worse, check with your doctor. ° °Dulcolax (bisacodyl) - Pick up over-the-counter and take as directed  by the product packaging as needed to assist with the movement of your bowels.  Take with a full glass of water.  Use this product as needed if not relieved by Colace only.  ° °MiraLax (polyethylene glycol) - Pick up over-the-counter to have on hand.  MiraLax is a solution that will increase the amount of water in your bowels to assist with bowel movements.  Take as directed and can mix with a glass of water, juice, soda, coffee, or tea.  Take if you go more than two days without a movement. °Do not use MiraLax more than once per day. Call your doctor if you are still constipated or irregular after using this medication for 7 days in a row. ° °If you continue to have problems with postoperative constipation, please contact the office for further assistance and recommendations.  If you experience "the worst abdominal pain ever" or develop nausea or vomiting, please contact the office immediatly for further recommendations for treatment. ° °ITCHING ° If you experience itching with your medications, try taking only a single pain pill, or even half a pain pill at a time.  You can also use Benadryl over the counter for itching or also to help with sleep.  ° °TED HOSE STOCKINGS °Wear the elastic stockings on both legs for three weeks following surgery during the day but you may remove then at night for sleeping. ° °MEDICATIONS °See your medication summary on the “After Visit Summary” that the nursing staff will review with you prior to discharge.  You may have some home medications which will be placed on hold until you complete the course of blood thinner medication.  It is important for you to complete the blood thinner medication as prescribed by your surgeon.  Continue your approved medications as instructed at time of discharge. ° °PRECAUTIONS °If you experience chest pain or shortness of breath - call 911 immediately for transfer to the hospital emergency department.  °If you develop a fever greater that 101 F,  purulent drainage from wound, increased redness or drainage from wound, foul odor from the wound/dressing, or calf pain - CONTACT YOUR SURGEON.   °                                                °FOLLOW-UP APPOINTMENTS °Make sure you keep all of your appointments after your operation with your surgeon and caregivers. You should call the office at the above phone number and make an appointment for approximately two weeks after the date of your surgery or on the date instructed by your surgeon outlined in the "After Visit Summary". ° °RANGE OF MOTION AND STRENGTHENING EXERCISES  °These exercises are designed to help you keep full movement of your hip joint. Follow your caregiver's or physical therapist's instructions. Perform all exercises about fifteen times, three times per day or as directed. Exercise both hips, even if you   have had only one joint replacement. These exercises can be done on a training (exercise) mat, on the floor, on a table or on a bed. Use whatever works the best and is most comfortable for you. Use music or television while you are exercising so that the exercises are a pleasant break in your day. This will make your life better with the exercises acting as a break in routine you can look forward to.  Lying on your back, slowly slide your foot toward your buttocks, raising your knee up off the floor. Then slowly slide your foot back down until your leg is straight again.  Lying on your back spread your legs as far apart as you can without causing discomfort.  Lying on your side, raise your upper leg and foot straight up from the floor as far as is comfortable. Slowly lower the leg and repeat.  Lying on your back, tighten up the muscle in the front of your thigh (quadriceps muscles). You can do this by keeping your leg straight and trying to raise your heel off the floor. This helps strengthen the largest muscle supporting your knee.  Lying on your back, tighten up the muscles of your  buttocks both with the legs straight and with the knee bent at a comfortable angle while keeping your heel on the floor.   IF YOU ARE TRANSFERRED TO A SKILLED REHAB FACILITY If the patient is transferred to a skilled rehab facility following release from the hospital, a list of the current medications will be sent to the facility for the patient to continue.  When discharged from the skilled rehab facility, please have the facility set up the patient's [place] prior to being released. Also, the skilled facility will be responsible for providing the patient with their medications at time of release from the facility to include their pain medication, the muscle relaxants, and their blood thinner medication. If the patient is still at the rehab facility at time of the two week follow up appointment, the skilled rehab facility will also need to assist the patient in arranging follow up appointment in our office and any transportation needs.  MAKE SURE YOU:  Understand these instructions.  Get help right away if you are not doing well or get worse.    Pick up stool softner and laxative for home use following surgery while on pain medications. Do not submerge incision under water. Please use good hand washing techniques while changing dressing each day. May shower starting three days after surgery. Please use a clean towel to pat the incision dry following showers. Continue to use ice for pain and swelling after surgery. Do not use any lotions or creams on the incision until instructed by your surgeon.  Take Xarelto for two and a half more weeks, then discontinue Xarelto. Once the patient has completed the blood thinner regimen, then take a Baby 81 mg Aspirin daily for three more weeks.    Information on my medicine - XARELTO (Rivaroxaban)  This medication education was reviewed with me or my healthcare representative as part of my discharge preparation.  The pharmacist that  spoke with me during my hospital stay was:  Rudean Haskell, [facility]  Why was Xarelto prescribed for you? Xarelto was prescribed for you to reduce the risk of blood clots forming after orthopedic surgery. The medical term for these abnormal blood clots is venous thromboembolism (VTE).  What do you need to know about xarelto ? Take your  Xarelto ONCE DAILY at the same time every day. You may take it either with or without food.  If you have difficulty swallowing the tablet whole, you may crush it and mix in applesauce just prior to taking your dose.  Take Xarelto exactly as prescribed by your doctor and DO NOT stop taking Xarelto without talking to the doctor who prescribed the medication.  Stopping without other VTE prevention medication to take the place of Xarelto may increase your risk of developing a clot.  After discharge, you should have regular check-up appointments with your healthcare provider that is prescribing your Xarelto.    What do you do if you miss a dose? If you miss a dose, take it as soon as you remember on the same day then continue your regularly scheduled once daily regimen the next day. Do not take two doses of Xarelto on the same day.   Important Safety Information A possible side effect of Xarelto is bleeding. You should call your healthcare provider right away if you experience any of the following: ? Bleeding from an injury or your nose that does not stop. ? Unusual colored urine (red or dark brown) or unusual colored stools (red or black). ? Unusual bruising for unknown reasons. ? A serious fall or if you hit your head (even if there is no bleeding).  Some medicines may interact with Xarelto and might increase your risk of bleeding while on Xarelto. To help avoid this, consult your healthcare provider or pharmacist prior to using any new prescription or non-prescription medications, including herbals, vitamins, non-steroidal anti-inflammatory drugs  (NSAIDs) and supplements.  This website has more information on Xarelto: [email or web address]

## 2015-08-07 LAB — CBC
HCT: 32.2 % — ABNORMAL LOW (ref 36.0–46.0)
Hemoglobin: 10.8 g/dL — ABNORMAL LOW (ref 12.0–15.0)
MCH: 30.1 pg (ref 26.0–34.0)
MCHC: 33.5 g/dL (ref 30.0–36.0)
MCV: 89.7 fL (ref 78.0–100.0)
PLATELETS: 172 10*3/uL (ref 150–400)
RBC: 3.59 MIL/uL — AB (ref 3.87–5.11)
RDW: 13.8 % (ref 11.5–15.5)
WBC: 9.7 10*3/uL (ref 4.0–10.5)

## 2015-08-07 LAB — BASIC METABOLIC PANEL
Anion gap: 4 — ABNORMAL LOW (ref 5–15)
BUN: 18 mg/dL (ref 6–20)
CHLORIDE: 109 mmol/L (ref 101–111)
CO2: 29 mmol/L (ref 22–32)
Calcium: 8.6 mg/dL — ABNORMAL LOW (ref 8.9–10.3)
Creatinine, Ser: 0.69 mg/dL (ref 0.44–1.00)
GFR calc Af Amer: >60 mL/min (ref >60)
GFR calc non Af Amer: >60 mL/min (ref >60)
GLUCOSE: 105 mg/dL — AB (ref 65–99)
POTASSIUM: 3.9 mmol/L (ref 3.5–5.1)
Sodium: 142 mmol/L (ref 135–145)

## 2015-08-07 MED ORDER — HEPARIN SOD (PORK) LOCK FLUSH 100 UNIT/ML IV SOLN
500.0000 [IU] | INTRAVENOUS | Status: AC | PRN
  Administered 2015-08-07: 500 [IU]

## 2015-08-07 MED ORDER — METHOCARBAMOL 500 MG PO TABS: 500.0000 mg | ORAL_TABLET | Freq: Four times a day (QID) | ORAL | 0 refills | Status: DC | PRN

## 2015-08-07 MED ORDER — OXYCODONE HCL 5 MG PO TABS
5.0000 mg | ORAL_TABLET | ORAL | 0 refills | Status: DC | PRN
Start: 2015-08-07 — End: 2015-11-02

## 2015-08-07 MED ORDER — TRAMADOL HCL 50 MG PO TABS: 50.0000 mg | ORAL_TABLET | Freq: Four times a day (QID) | ORAL | 1 refills | Status: DC | PRN

## 2015-08-07 MED ORDER — RIVAROXABAN 10 MG PO TABS: 10.0000 mg | ORAL_TABLET | Freq: Every day | ORAL | 0 refills | Status: DC

## 2015-08-07 NOTE — Progress Notes (Signed)
Occupational Therapy Treatment Patient Details Name: Tiffany Sweeney MRN: [id number] DOB: 1934-11-29 Today's Date: 08/07/2015    History of present illness s/p R DA THA; h/o bil breast CA and has port a cath   OT comments  All education completed.  Pt needs 24/7 assistance for adls and mobility  Follow Up Recommendations  Supervision/Assistance - 24 hour    Equipment Recommendations  None recommended by OT (pt does not want 3:1)    Recommendations for Other Services      Precautions / Restrictions Precautions Precautions: Fall Restrictions Weight Bearing Restrictions: No       Mobility Bed Mobility                  Transfers   Equipment used: Rolling walker (2 wheeled)   Sit to Stand: Min assist;Mod assist         General transfer comment: dependent upon surface height. Pt is correcly placing UEs and LEs without cues    Balance                                   ADL               Lower Body Bathing: Minimal assistance;Sit to/from stand       Lower Body Dressing: Minimal assistance;Sit to/from stand;With adaptive equipment (reacher:  slip and skirt)   Toilet Transfer: Moderate assistance;Ambulation;Comfort height toilet;Grab bars;RW (mod A from comfort height )       Tub/ Shower Transfer: Walk-in shower;Minimal assistance;Ambulation     General ADL Comments: needs more assistance getting up from comfort height commode than chair.  Performed ADL prior to walking into bathroom. Pt did not have underwear here:  used reacher for skirt and slip.  Pt needed assistance to step out of shower with RLE.  Had a LOB when managing clothing during dressing: recommended 24/7 assistance at home      [place] During Therapy: [facility] for tasks assessed/performed Overall Cognitive Status: Within Functional Limits for tasks assessed                        Extremity/Trunk Assessment               Exercises     Shoulder Instructions       General Comments      Pertinent Vitals/ Pain       Pain Assessment: 0-10 Pain Score: 2  Pain Location: R hip/thigh Pain Descriptors / Indicators: Sore Pain Intervention(s): Limited activity within patient's tolerance;Monitored during session;Repositioned;Premedicated before session  Home Living                                          Prior Functioning/Environment              Frequency       Progress Toward Goals  OT Goals(current goals can now be found in the care plan section)  Progress towards OT goals: Goals met/education completed, patient discharged from [place]  End of Session     Activity Tolerance Patient tolerated treatment well   Patient Left in chair;with call bell/phone within reach   Nurse Communication          Time: [phone] OT Time Calculation (min): 40 min  Charges: OT General Charges $OT Visit: 1 Procedure OT Treatments $Self Care/Home Management : 38-52 mins  Naryah Clenney 08/07/2015, 10:26 AM  Lesle Chris, OTR/L [phone] 08/07/2015

## 2015-08-07 NOTE — Progress Notes (Signed)
   Subjective: 2 Days Post-Op Procedure(s) (LRB): RIGHT TOTAL HIP ARTHROPLASTY ANTERIOR APPROACH (Right) Patient reports pain as mild.   Patient seen in rounds with Dr. Wynelle Link. Patient is well, and has had no acute complaints or problems Patient is ready to go home today after morning therapy.  Objective: Vital signs in last 24 hours: Temp:  [97.7 F (36.5 C)-98.9 F (37.2 C)] 98.9 F (37.2 C) (08/04 0540) Pulse Rate:  [61-78] 77 (08/04 0540) Resp:  [16-17] 16 (08/04 0540) BP: (158-179)/(76-88) 158/79 (08/04 0540) SpO2:  [95 %-96 %] 96 % (08/04 0540)  Intake/Output from previous day:  Intake/Output Summary (Last 24 hours) at 08/07/15 0746 Last data filed at 08/07/15 0745  Gross per 24 hour  Intake          2069.67 ml  Output              925 ml  Net          1144.67 ml    Intake/Output this shift: Total I/O In: -  Out: 300 [Urine:300]  Labs:  Recent Labs  08/06/15 0442 08/07/15 0405  HGB 11.8* 10.8*    Recent Labs  08/06/15 0442 08/07/15 0405  WBC 11.5* 9.7  RBC 3.91 3.59*  HCT 34.6* 32.2*  PLT 177 172    Recent Labs  08/06/15 0442 08/07/15 0405  NA 140 142  K 3.4* 3.9  CL 108 109  CO2 26 29  BUN 11 18  CREATININE 0.66 0.69  GLUCOSE 166* 105*  CALCIUM 8.5* 8.6*   No results for input(s): LABPT, INR in the last 72 hours.  EXAM: General - Patient is Alert, Appropriate and Oriented Extremity - Neurovascular intact Sensation intact distally Dorsiflexion/Plantar flexion intact Incision - clean, dry, no drainage Motor Function - intact, moving foot and toes well on exam.   Assessment/Plan: 2 Days Post-Op Procedure(s) (LRB): RIGHT TOTAL HIP ARTHROPLASTY ANTERIOR APPROACH (Right) Procedure(s) (LRB): RIGHT TOTAL HIP ARTHROPLASTY ANTERIOR APPROACH (Right) Past Medical History:  Diagnosis Date  . Anxiety   . Arthritis   . Breast cancer ([place])   . Cancer [facility])    breast- right  . Family history of colon cancer   . Family history of ovarian  cancer   . Family history of pancreatic cancer   . High cholesterol   . Hypertension   . Skin cancer    Patient stated  she has low grade skin cancer in right hand   Principal Problem:   OA (osteoarthritis) of hip  Estimated body mass index is 27.98 kg/m as calculated from the following:   Height as of this encounter: 5\' 4"  (1.626 m).   Weight as of this encounter: 73.9 kg (163 lb). Up with therapy Discharge home with home health Diet - Cardiac diet Follow up - in 2 weeks Activity - WBAT Disposition - Home Condition Upon Discharge - Good D/C Meds - See DC Summary DVT Prophylaxis - [place], PA-C Orthopaedic Surgery 08/07/2015, 7:46 AM

## 2015-08-07 NOTE — Discharge Summary (Signed)
Physician Discharge Summary   Patient ID: Tiffany Sweeney MRN: [id number] DOB/AGE: [birth year] 81 y.o.  Admit date: 08/05/2015 Discharge date: 08/07/2015  Primary Diagnosis:  Osteoarthritis of the Right hip.   Admission Diagnoses:  Past Medical History:  Diagnosis Date  . Anxiety   . Arthritis   . Breast cancer ([place])   . Cancer [facility])    breast- right  . Family history of colon cancer   . Family history of ovarian cancer   . Family history of pancreatic cancer   . High cholesterol   . Hypertension   . Skin cancer    Patient stated  she has low grade skin cancer in right hand   Discharge Diagnoses:   Principal Problem:   OA (osteoarthritis) of hip  Estimated body mass index is 27.98 kg/m as calculated from the following:   Height as of this encounter: _0  (1.626 m).   Weight as of this encounter: 73.9 kg (163 lb).  Procedure(s) (LRB): RIGHT TOTAL HIP ARTHROPLASTY ANTERIOR APPROACH (Right)   Consults: None  HPI: Tiffany Sweeney is a 81 y.o. female who has advanced end-  stage arthritis of their Right  hip with progressively worsening pain and  dysfunction.The patient has failed nonoperative management and presents for  total hip arthroplasty.   Laboratory Data: Admission on 08/05/2015  Component Date Value Ref Range Status  . Color, Urine 08/05/2015 YELLOW  YELLOW Final  . APPearance 08/05/2015 CLEAR  CLEAR Final  . Specific Gravity, Urine 08/05/2015 1.019  1.005 - 1.030 Final  . pH 08/05/2015 6.5  5.0 - 8.0 Final  . Glucose, UA 08/05/2015 NEGATIVE  NEGATIVE mg/dL Final  . Hgb urine dipstick 08/05/2015 NEGATIVE  NEGATIVE Final  . Bilirubin Urine 08/05/2015 NEGATIVE  NEGATIVE Final  . Ketones, ur 08/05/2015 NEGATIVE  NEGATIVE mg/dL Final  . Protein, ur 08/05/2015 NEGATIVE  NEGATIVE mg/dL Final  . Nitrite 08/05/2015 NEGATIVE  NEGATIVE Final  . Leukocytes, UA 08/05/2015 NEGATIVE  NEGATIVE Final  . WBC 08/06/2015 11.5* 4.0 - 10.5 K/uL Final  . RBC 08/06/2015 3.91   3.87 - 5.11 MIL/uL Final  . Hemoglobin 08/06/2015 11.8* 12.0 - 15.0 g/dL Final  . HCT 08/06/2015 34.6* 36.0 - 46.0 % Final  . MCV 08/06/2015 88.5  78.0 - 100.0 fL Final  . MCH 08/06/2015 30.2  26.0 - 34.0 pg Final  . MCHC 08/06/2015 34.1  30.0 - 36.0 g/dL Final  . RDW 08/06/2015 13.7  11.5 - 15.5 % Final  . Platelets 08/06/2015 177  150 - 400 K/uL Final  . Sodium 08/06/2015 140  135 - 145 mmol/L Final  . Potassium 08/06/2015 3.4* 3.5 - 5.1 mmol/L Final  . Chloride 08/06/2015 108  101 - 111 mmol/L Final  . CO2 08/06/2015 26  22 - 32 mmol/L Final  . Glucose, Bld 08/06/2015 166* 65 - 99 mg/dL Final  . BUN 08/06/2015 11  6 - 20 mg/dL Final  . Creatinine, Ser 08/06/2015 0.66  0.44 - 1.00 mg/dL Final  . Calcium 08/06/2015 8.5* 8.9 - 10.3 mg/dL Final  . GFR calc non Af Amer 08/06/2015 >60  >60 mL/min Final  . GFR calc Af Amer 08/06/2015 >60  >60 mL/min Final   Comment: (NOTE) The eGFR has been calculated using the CKD EPI equation. This calculation has not been validated in all clinical situations. eGFR's persistently <60 mL/min signify possible Chronic Kidney Disease.   . Anion gap 08/06/2015 6  5 - 15 Final  . WBC 08/07/2015 9.7  4.0 -  10.5 K/uL Final  . RBC 08/07/2015 3.59* 3.87 - 5.11 MIL/uL Final  . Hemoglobin 08/07/2015 10.8* 12.0 - 15.0 g/dL Final  . HCT 08/07/2015 32.2* 36.0 - 46.0 % Final  . MCV 08/07/2015 89.7  78.0 - 100.0 fL Final  . MCH 08/07/2015 30.1  26.0 - 34.0 pg Final  . MCHC 08/07/2015 33.5  30.0 - 36.0 g/dL Final  . RDW 08/07/2015 13.8  11.5 - 15.5 % Final  . Platelets 08/07/2015 172  150 - 400 K/uL Final  . Sodium 08/07/2015 142  135 - 145 mmol/L Final  . Potassium 08/07/2015 3.9  3.5 - 5.1 mmol/L Final  . Chloride 08/07/2015 109  101 - 111 mmol/L Final  . CO2 08/07/2015 29  22 - 32 mmol/L Final  . Glucose, Bld 08/07/2015 105* 65 - 99 mg/dL Final  . BUN 08/07/2015 18  6 - 20 mg/dL Final  . Creatinine, Ser 08/07/2015 0.69  0.44 - 1.00 mg/dL Final  . Calcium  08/07/2015 8.6* 8.9 - 10.3 mg/dL Final  . GFR calc non Af Amer 08/07/2015 >60  >60 mL/min Final  . GFR calc Af Amer 08/07/2015 >60  >60 mL/min Final   Comment: (NOTE) The eGFR has been calculated using the CKD EPI equation. This calculation has not been validated in all clinical situations. eGFR's persistently <60 mL/min signify possible Chronic Kidney Disease.   Georgiann Hahn gap 08/07/2015 4* 5 - 15 Final  Hospital Outpatient Visit on 07/31/2015  Component Date Value Ref Range Status  . MRSA, PCR 07/31/2015 NEGATIVE  NEGATIVE Final  . Staphylococcus aureus 07/31/2015 NEGATIVE  NEGATIVE Final   Comment:        The Xpert SA Assay (FDA approved for NASAL specimens in patients over 42 years of age), is one component of a comprehensive surveillance program.  Test performance has been validated by [facility] for patients greater than or equal to 57 year old. It is not intended to diagnose infection nor to guide or monitor treatment.   Marland Kitchen aPTT 07/31/2015 74* 24 - 36 seconds Final   Comment:        IF BASELINE aPTT IS ELEVATED, SUGGEST PATIENT RISK ASSESSMENT BE USED TO DETERMINE APPROPRIATE ANTICOAGULANT THERAPY.   . WBC 07/31/2015 6.2  4.0 - 10.5 K/uL Final  . RBC 07/31/2015 4.38  3.87 - 5.11 MIL/uL Final  . Hemoglobin 07/31/2015 13.0  12.0 - 15.0 g/dL Final  . HCT 07/31/2015 39.1  36.0 - 46.0 % Final  . MCV 07/31/2015 89.3  78.0 - 100.0 fL Final  . MCH 07/31/2015 29.7  26.0 - 34.0 pg Final  . MCHC 07/31/2015 33.2  30.0 - 36.0 g/dL Final  . RDW 07/31/2015 13.6  11.5 - 15.5 % Final  . Platelets 07/31/2015 217  150 - 400 K/uL Final  . Sodium 07/31/2015 138  135 - 145 mmol/L Final  . Potassium 07/31/2015 3.9  3.5 - 5.1 mmol/L Final  . Chloride 07/31/2015 105  101 - 111 mmol/L Final  . CO2 07/31/2015 27  22 - 32 mmol/L Final  . Glucose, Bld 07/31/2015 84  65 - 99 mg/dL Final  . BUN 07/31/2015 16  6 - 20 mg/dL Final  . Creatinine, Ser 07/31/2015 0.59  0.44 - 1.00 mg/dL Final  .  Calcium 07/31/2015 9.0  8.9 - 10.3 mg/dL Final  . Total Protein 07/31/2015 6.7  6.5 - 8.1 g/dL Final  . Albumin 07/31/2015 3.9  3.5 - 5.0 g/dL Final  . AST 07/31/2015 16  15 - 41 U/L Final  . ALT 07/31/2015 13* 14 - 54 U/L Final  . Alkaline Phosphatase 07/31/2015 64  38 - 126 U/L Final  . Total Bilirubin 07/31/2015 0.8  0.3 - 1.2 mg/dL Final  . GFR calc non Af Amer 07/31/2015 >60  >60 mL/min Final  . GFR calc Af Amer 07/31/2015 >60  >60 mL/min Final   Comment: (NOTE) The eGFR has been calculated using the CKD EPI equation. This calculation has not been validated in all clinical situations. eGFR's persistently <60 mL/min signify possible Chronic Kidney Disease.   . Anion gap 07/31/2015 6  5 - 15 Final  . Prothrombin Time 07/31/2015 13.8  11.4 - 15.2 seconds Final  . INR 07/31/2015 1.06   Final  . ABO/RH(D) 08/05/2015 A POS   Final  . Antibody Screen 08/05/2015 NEG   Final  . Sample Expiration 08/05/2015 08/08/2015   Final  . Extend sample reason 08/05/2015 NO TRANSFUSIONS OR PREGNANCY IN THE PAST 3 MONTHS   Final  . Color, Urine 07/31/2015 YELLOW  YELLOW Final  . APPearance 07/31/2015 CLEAR  CLEAR Final  . Specific Gravity, Urine 07/31/2015 1.013  1.005 - 1.030 Final  . pH 07/31/2015 6.0  5.0 - 8.0 Final  . Glucose, UA 07/31/2015 NEGATIVE  NEGATIVE mg/dL Final  . Hgb urine dipstick 07/31/2015 NEGATIVE  NEGATIVE Final  . Bilirubin Urine 07/31/2015 NEGATIVE  NEGATIVE Final  . Ketones, ur 07/31/2015 NEGATIVE  NEGATIVE mg/dL Final  . Protein, ur 07/31/2015 NEGATIVE  NEGATIVE mg/dL Final  . Nitrite 07/31/2015 NEGATIVE  NEGATIVE Final  . Leukocytes, UA 07/31/2015 NEGATIVE  NEGATIVE Final  Hospital Outpatient Visit on 07/10/2015  Component Date Value Ref Range Status  . Sodium 07/10/2015 141  135 - 145 mmol/L Final  . Potassium 07/10/2015 3.6  3.5 - 5.1 mmol/L Final  . Chloride 07/10/2015 107  101 - 111 mmol/L Final  . CO2 07/10/2015 28  22 - 32 mmol/L Final  . Glucose, Bld 07/10/2015  93  65 - 99 mg/dL Final  . BUN 07/10/2015 14  6 - 20 mg/dL Final  . Creatinine, Ser 07/10/2015 0.64  0.44 - 1.00 mg/dL Final  . Calcium 07/10/2015 8.8* 8.9 - 10.3 mg/dL Final  . GFR calc non Af Amer 07/10/2015 >60  >60 mL/min Final  . GFR calc Af Amer 07/10/2015 >60  >60 mL/min Final   Comment: (NOTE) The eGFR has been calculated using the CKD EPI equation. This calculation has not been validated in all clinical situations. eGFR's persistently <60 mL/min signify possible Chronic Kidney Disease.   . Anion gap 07/10/2015 6  5 - 15 Final  Infusion on 07/02/2015  Component Date Value Ref Range Status  . Sodium 07/02/2015 138  135 - 145 mmol/L Final  . Potassium 07/02/2015 3.8  3.5 - 5.1 mmol/L Final  . Chloride 07/02/2015 105  101 - 111 mmol/L Final  . CO2 07/02/2015 27  22 - 32 mmol/L Final  . Glucose, Bld 07/02/2015 73  65 - 99 mg/dL Final  . BUN 07/02/2015 17  6 - 20 mg/dL Final  . Creatinine, Ser 07/02/2015 0.67  0.44 - 1.00 mg/dL Final  . Calcium 07/02/2015 8.8* 8.9 - 10.3 mg/dL Final  . Total Protein 07/02/2015 6.5  6.5 - 8.1 g/dL Final  . Albumin 07/02/2015 3.8  3.5 - 5.0 g/dL Final  . AST 07/02/2015 18  15 - 41 U/L Final  . ALT 07/02/2015 14  14 - 54 U/L Final  . Alkaline  Phosphatase 07/02/2015 62  38 - 126 U/L Final  . Total Bilirubin 07/02/2015 0.4  0.3 - 1.2 mg/dL Final  . GFR calc non Af Amer 07/02/2015 >60  >60 mL/min Final  . GFR calc Af Amer 07/02/2015 >60  >60 mL/min Final   Comment: (NOTE) The eGFR has been calculated using the CKD EPI equation. This calculation has not been validated in all clinical situations. eGFR's persistently <60 mL/min signify possible Chronic Kidney Disease.   . Anion gap 07/02/2015 6  5 - 15 Final  . WBC 07/02/2015 5.5  4.0 - 10.5 K/uL Final  . RBC 07/02/2015 4.35  3.87 - 5.11 MIL/uL Final  . Hemoglobin 07/02/2015 13.1  12.0 - 15.0 g/dL Final  . HCT 07/02/2015 39.1  36.0 - 46.0 % Final  . MCV 07/02/2015 89.9  78.0 - 100.0 fL Final  .  MCH 07/02/2015 30.1  26.0 - 34.0 pg Final  . MCHC 07/02/2015 33.5  30.0 - 36.0 g/dL Final  . RDW 07/02/2015 13.5  11.5 - 15.5 % Final  . Platelets 07/02/2015 202  150 - 400 K/uL Final  . Neutrophils Relative % 07/02/2015 61  % Final  . Neutro Abs 07/02/2015 3.4  1.7 - 7.7 K/uL Final  . Lymphocytes Relative 07/02/2015 26  % Final  . Lymphs Abs 07/02/2015 1.4  0.7 - 4.0 K/uL Final  . Monocytes Relative 07/02/2015 11  % Final  . Monocytes Absolute 07/02/2015 0.6  0.1 - 1.0 K/uL Final  . Eosinophils Relative 07/02/2015 2  % Final  . Eosinophils Absolute 07/02/2015 0.1  0.0 - 0.7 K/uL Final  . Basophils Relative 07/02/2015 0  % Final  . Basophils Absolute 07/02/2015 0.0  0.0 - 0.1 K/uL Final     X-Rays:Dg Pelvis Portable  Result Date: 08/05/2015 CLINICAL DATA:  Postop right total hip replacement EXAM: PORTABLE PELVIS 1-2 VIEWS COMPARISON:  None. FINDINGS: Changes of right hip replacement. Normal AP alignment. No hardware or bony complicating feature noted. Soft tissue drain in place. Moderate degenerative changes in the left hip. IMPRESSION: Right hip replacement.  No visible complicating feature. Electronically Signed   By: Rolm Baptise M.D.   On: 08/05/2015 10:39   Dg C-arm 1-60 Min-no Report  Result Date: 08/05/2015 CLINICAL DATA: surgery C-ARM 1-60 MINUTES Fluoroscopy was utilized by the requesting physician.  No radiographic interpretation.    EKG: Orders placed or performed during the hospital encounter of 07/31/15  . EKG 12-Lead  . EKG 12-Lead     Hospital Course: Patient was admitted to [facility] and taken to the OR and underwent the above state procedure without complications.  Patient tolerated the procedure well and was later transferred to the recovery room and then to the orthopaedic floor for postoperative care.  They were given PO and IV analgesics for pain control following their surgery.  They were given 24 hours of postoperative antibiotics of  Anti-infectives      Start     Dose/Rate Route Frequency Ordered Stop   08/05/15 1500  ceFAZolin (ANCEF) IVPB 2g/100 mL premix     2 g 200 mL/hr over 30 Minutes Intravenous Every 6 hours 08/05/15 1134 08/05/15 2148   08/05/15 0620  ceFAZolin (ANCEF) IVPB 2g/100 mL premix     2 g 200 mL/hr over 30 Minutes Intravenous On call to O.R. 08/05/15 [id number] 08/05/15 0845     and started on DVT prophylaxis in the form of Xarelto.   PT and OT were ordered for total hip  protocol.  The patient was allowed to be WBAT with therapy. Discharge planning was consulted to help with postop disposition and equipment needs.  Patient had a decent night on the evening of surgery.  They started to get up OOB with therapy on day one and walked 29 feet and 98 feet.  Hemovac drain was pulled without difficulty.  Continued to work with therapy into day two.  Dressing was changed on day two and the incision was healing well.  Patient was seen in rounds and was ready to go home.  Discharge home with home health Diet - Cardiac diet Follow up - in 2 weeks Activity - WBAT Disposition - Home Condition Upon Discharge - Good D/C Meds - See DC Summary DVT Prophylaxis - Xarelto  Discharge Instructions    Call MD / Call 911    Complete by:  As directed   If you experience chest pain or shortness of breath, CALL 911 and be transported to the hospital emergency room.  If you develope a fever above 101 F, pus (white drainage) or increased drainage or redness at the wound, or calf pain, call your surgeon's office.   Change dressing    Complete by:  As directed   You may change your dressing dressing daily with sterile 4 x 4 inch gauze dressing and paper tape.  Do not submerge the incision under water.   Constipation Prevention    Complete by:  As directed   Drink plenty of fluids.  Prune juice may be helpful.  You may use a stool softener, such as Colace (over the counter) 100 mg twice a day.  Use MiraLax (over the counter) for constipation as needed.    Diet - low sodium heart healthy    Complete by:  As directed   Discharge instructions    Complete by:  As directed   Pick up stool softner and laxative for home use following surgery while on pain medications. Do not submerge incision under water. Please use good hand washing techniques while changing dressing each day. May shower starting three days after surgery. Please use a clean towel to pat the incision dry following showers. Continue to use ice for pain and swelling after surgery. Do not use any lotions or creams on the incision until instructed by your surgeon.   Postoperative Constipation Protocol  Constipation - defined medically as fewer than three stools per week and severe constipation as less than one stool per week.  One of the most common issues patients have following surgery is constipation.  Even if you have a regular bowel pattern at home, your normal regimen is likely to be disrupted due to multiple reasons following surgery.  Combination of anesthesia, postoperative narcotics, change in appetite and fluid intake all can affect your bowels.  In order to avoid complications following surgery, here are some recommendations in order to help you during your recovery period.  Colace (docusate) - Pick up an over-the-counter form of Colace or another stool softener and take twice a day as long as you are requiring postoperative pain medications.  Take with a full glass of water daily.  If you experience loose stools or diarrhea, hold the colace until you stool forms back up.  If your symptoms do not get better within 1 week or if they get worse, check with your doctor.  Dulcolax (bisacodyl) - Pick up over-the-counter and take as directed by the product packaging as needed to assist with the movement of your bowels.  Take with a full glass of water.  Use this product as needed if not relieved by Colace only.   MiraLax (polyethylene glycol) - Pick up over-the-counter to have on hand.   MiraLax is a solution that will increase the amount of water in your bowels to assist with bowel movements.  Take as directed and can mix with a glass of water, juice, soda, coffee, or tea.  Take if you go more than two days without a movement. Do not use MiraLax more than once per day. Call your doctor if you are still constipated or irregular after using this medication for 7 days in a row.  If you continue to have problems with postoperative constipation, please contact the office for further assistance and recommendations.  If you experience "the worst abdominal pain ever" or develop nausea or vomiting, please contact the office immediatly for further recommendations for treatment.   Take Xarelto for two and a half more weeks, then discontinue Xarelto. Once the patient has completed the blood thinner regimen, then take a Baby 81 mg Aspirin daily for three more weeks.   Do not sit on low chairs, stoools or toilet seats, as it may be difficult to get up from low surfaces    Complete by:  As directed   Driving restrictions    Complete by:  As directed   No driving until released by the physician.   Increase activity slowly as tolerated    Complete by:  As directed   Lifting restrictions    Complete by:  As directed   No lifting until released by the physician.   Patient may shower    Complete by:  As directed   You may shower without a dressing once there is no drainage.  Do not wash over the wound.  If drainage remains, do not shower until drainage stops.   TED hose    Complete by:  As directed   Use stockings (TED hose) for 3 weeks on both leg(s).  You may remove them at night for sleeping.   Weight bearing as tolerated    Complete by:  As directed   Laterality:  right   Extremity:  Lower       Medication List    TAKE these medications   atenolol 50 MG tablet Commonly known as:  TENORMIN Take 50 mg by mouth daily.   docusate sodium 100 MG capsule Commonly known as:  COLACE Take  100 mg by mouth daily as needed for mild constipation.   methocarbamol 500 MG tablet Commonly known as:  ROBAXIN Take 1 tablet (500 mg total) by mouth every 6 (six) hours as needed for muscle spasms.   oxyCODONE 5 MG immediate release tablet Commonly known as:  Oxy IR/ROXICODONE Take 1-2 tablets (5-10 mg total) by mouth every 3 (three) hours as needed for moderate pain or severe pain.   rivaroxaban 10 MG Tabs tablet Commonly known as:  XARELTO Take 1 tablet (10 mg total) by mouth daily with breakfast. Take Xarelto for two and a half more weeks, then discontinue Xarelto. Once the patient has completed the blood thinner regimen, then take a Baby 81 mg Aspirin daily for three more weeks.   traMADol 50 MG tablet Commonly known as:  ULTRAM Take 1-2 tablets (50-100 mg total) by mouth every 6 (six) hours as needed (mild pain).      Follow-up Information    [facility] .   Why:  [facility] will provide your home health  physical therapy Contact information: [address] [place] [address] [phone]        Gearlean Alf, MD. Schedule an appointment as soon as possible for a visit on 08/18/2015.   Specialty:  Orthopedic Surgery Why:  Call office at [phone] to setup appointment on Tuesday 08/18/2015 with Dr. Wynelle Link. Contact information: [address] [place] [address] [phone]           Signed: Arlee Muslim, PA-C Orthopaedic Surgery 08/07/2015, 7:51 AM

## 2015-08-07 NOTE — Progress Notes (Signed)
Physical Therapy Treatment Patient Details Name: Tiffany Sweeney MRN: [id number] DOB: [birth year] Today's Date: 08/07/2015    History of Present Illness s/p R DA THA; h/o bil breast CA and has port a cath    PT Comments    Pt progressing well with mobility and eager for return home.  Reviewed stairs and car transfers.  Follow Up Recommendations  Home health PT     Equipment Recommendations  None recommended by PT    Recommendations for Other Services OT consult     Precautions / Restrictions Precautions Precautions: Fall Precaution Comments: Pt has portacath Restrictions Weight Bearing Restrictions: No Other Position/Activity Restrictions: WBAT    Mobility  Bed Mobility               General bed mobility comments: Pt OOB and declines back to bed  Transfers Overall transfer level: Needs assistance Equipment used: Rolling walker (2 wheeled) Transfers: Sit to/from Stand Sit to Stand: Min guard         General transfer comment: dependent upon surface height. Pt is correcly placing UEs and LEs without cues.  Min guard and increased time to rise and sit from recliner  Ambulation/Gait Ambulation/Gait assistance: Min guard;Supervision Ambulation Distance (Feet): 85 Feet Assistive device: Rolling walker (2 wheeled) Gait Pattern/deviations: Step-to pattern;Step-through pattern;Decreased step length - right;Decreased step length - left;Shuffle;Trunk flexed     General Gait Details: min cues for posture, sequence and position from RW.  Pt progressed to recip gait   Stairs Stairs: Yes Stairs assistance: Min assist Stair Management: Two rails;Step to pattern;Forwards Number of Stairs: 3 General stair comments: cues for sequence and foot placement  Wheelchair Mobility    Modified Rankin (Stroke Patients Only)       Balance                                    Cognition Arousal/Alertness: Awake/alert Behavior During Therapy: WFL for tasks  assessed/performed Overall Cognitive Status: Within Functional Limits for tasks assessed                      Exercises      General Comments        Pertinent Vitals/Pain Pain Assessment: 0-10 Pain Score: 2  Pain Location: R hip/thigh Pain Descriptors / Indicators: Aching;Sore Pain Intervention(s): Limited activity within patient's tolerance;Monitored during session;Premedicated before session;Ice applied    Home Living                      Prior Function            PT Goals (current goals can now be found in the care plan section) Acute Rehab PT Goals Patient Stated Goal: Regain IND to return home PT Goal Formulation: With patient Time For Goal Achievement: 08/15/15 Potential to Achieve Goals: Fair Progress towards PT goals: Progressing toward goals    Frequency  7X/week    PT Plan Current plan remains appropriate    Co-evaluation             End of Session Equipment Utilized During Treatment: Gait belt Activity Tolerance: Patient tolerated treatment well Patient left: in chair;with call bell/phone within reach;with chair alarm set     Time: 0940-1005 PT Time Calculation (min) (ACUTE ONLY): 25 min  Charges:  $Gait Training: 8-22 mins $Therapeutic Activity: 8-22 mins  G Codes:      Tiffany Sweeney 2015-08-18, 12:38 PM

## 2015-08-27 ENCOUNTER — Encounter (HOSPITAL_COMMUNITY): Payer: Medicare Other

## 2015-09-30 ENCOUNTER — Encounter (HOSPITAL_COMMUNITY): Payer: Self-pay

## 2015-09-30 ENCOUNTER — Encounter (HOSPITAL_COMMUNITY): Payer: Medicare Other | Attending: Family Medicine

## 2015-09-30 DIAGNOSIS — Z853 Personal history of malignant neoplasm of breast: Secondary | ICD-10-CM

## 2015-09-30 DIAGNOSIS — Z452 Encounter for adjustment and management of vascular access device: Secondary | ICD-10-CM | POA: Diagnosis present

## 2015-09-30 DIAGNOSIS — H1013 Acute atopic conjunctivitis, bilateral: Secondary | ICD-10-CM | POA: Insufficient documentation

## 2015-09-30 MED ORDER — HEPARIN SOD (PORK) LOCK FLUSH 100 UNIT/ML IV SOLN
500.0000 [IU] | Freq: Once | INTRAVENOUS | Status: AC
  Administered 2015-09-30: 500 [IU] via INTRAVENOUS

## 2015-09-30 MED ORDER — HEPARIN SOD (PORK) LOCK FLUSH 100 UNIT/ML IV SOLN
INTRAVENOUS | Status: AC
  Filled 2015-09-30: qty 5

## 2015-09-30 MED ORDER — SODIUM CHLORIDE 0.9% FLUSH
10.0000 mL | INTRAVENOUS | Status: DC | PRN
  Administered 2015-09-30: 10 mL via INTRAVENOUS
  Filled 2015-09-30: qty 10

## 2015-09-30 NOTE — Patient Instructions (Signed)
[facility] at [facility] Discharge Instructions  RECOMMENDATIONS MADE BY THE CONSULTANT AND ANY TEST RESULTS WILL BE SENT TO YOUR REFERRING PHYSICIAN.  Port flush today. Return as scheduled for port flushes. Return as scheduled for office visit.   Thank you for choosing [facility] at [facility] to provide your oncology and hematology care.  To afford each patient quality time with our provider, please arrive at least 15 minutes before your scheduled appointment time.   Beginning January 23rd 2017 lab work for the Cancer Center will be done in the  Main lab at [place] on 1st floor. If you have a lab appointment with the Cancer Center please come in thru the  Main Entrance and check in at the main information desk  You need to re-schedule your appointment should you arrive 10 or more minutes late.  We strive to give you quality time with our providers, and arriving late affects you and other patients whose appointments are after yours.  Also, if you no show three or more times for appointments you may be dismissed from the clinic at the providers discretion.     Again, thank you for choosing [facility].  Our hope is that these requests will decrease the amount of time that you wait before being seen by our physicians.       _____________________________________________________________  Should you have questions after your visit to  Cancer Center, please contact our office at [phone] between the hours of 8:30 a.m. and 4:30 p.m.  Voicemails left after 4:30 p.m. will not be returned until the following business day.  For prescription refill requests, have your pharmacy contact our office.         Resources For Cancer Patients and their Caregivers ? American Cancer Society: Can assist with transportation, wigs, general needs, runs Look Good Feel Better.        1-888-227-6333 ? Cancer Care: Provides financial  assistance, online support groups, medication/co-pay assistance.  1-800-813-HOPE (4673) ? [facility] Assists [place] cancer patients and their families through emotional , educational and financial support.  336-427-4357 ? [place] DSS Where to apply for food stamps, Medicaid and utility assistance. 336-342-1394 ? RCATS: Transportation to medical appointments. 336-347-2287 ? Social Security Administration: May apply for disability if have a Stage IV cancer. 336-342-7796 1-800-772-1213 ? [place] Aging, Disability and Transit Services: Assists with nutrition, care and transit needs. 336-349-2343  Cancer Center Support Programs: @10RELATIVEDAYS@ > Cancer Support Group  2nd Tuesday of the month 1pm-2pm, Journey Room  > Creative Journey  3rd Tuesday of the month 1130am-1pm, Journey Room  > Look Good Feel Better  1st Wednesday of the month 10am-12 noon, Journey Room (Call American Cancer Society to register 1-800-395-5775)   

## 2015-09-30 NOTE — Progress Notes (Signed)
Harriet Masson presented for Portacath access and flush.  Portacath located right chest wall accessed with  H 20 needle.  Good blood return present. Portacath flushed with 37ml NS and 500U/22ml Heparin and needle removed intact.  Procedure tolerated well and without incident.

## 2015-11-02 ENCOUNTER — Encounter (HOSPITAL_BASED_OUTPATIENT_CLINIC_OR_DEPARTMENT_OTHER): Payer: Medicare Other

## 2015-11-02 ENCOUNTER — Encounter (HOSPITAL_COMMUNITY): Payer: Medicare Other | Attending: Family Medicine | Admitting: Hematology & Oncology

## 2015-11-02 VITALS — BP 157/74 | HR 68 | Temp 97.9°F | Resp 20 | Wt 164.8 lb

## 2015-11-02 DIAGNOSIS — C50412 Malignant neoplasm of upper-outer quadrant of left female breast: Secondary | ICD-10-CM

## 2015-11-02 DIAGNOSIS — Z853 Personal history of malignant neoplasm of breast: Secondary | ICD-10-CM

## 2015-11-02 DIAGNOSIS — R739 Hyperglycemia, unspecified: Secondary | ICD-10-CM

## 2015-11-02 DIAGNOSIS — Z95828 Presence of other vascular implants and grafts: Secondary | ICD-10-CM

## 2015-11-02 DIAGNOSIS — M8580 Other specified disorders of bone density and structure, unspecified site: Secondary | ICD-10-CM

## 2015-11-02 DIAGNOSIS — Z170 Estrogen receptor positive status [ER+]: Secondary | ICD-10-CM

## 2015-11-02 DIAGNOSIS — G620 Drug-induced polyneuropathy: Secondary | ICD-10-CM

## 2015-11-02 DIAGNOSIS — H1013 Acute atopic conjunctivitis, bilateral: Secondary | ICD-10-CM | POA: Diagnosis present

## 2015-11-02 DIAGNOSIS — Z803 Family history of malignant neoplasm of breast: Secondary | ICD-10-CM | POA: Diagnosis not present

## 2015-11-02 DIAGNOSIS — Z0000 Encounter for general adult medical examination without abnormal findings: Secondary | ICD-10-CM | POA: Insufficient documentation

## 2015-11-02 DIAGNOSIS — C50911 Malignant neoplasm of unspecified site of right female breast: Secondary | ICD-10-CM

## 2015-11-02 LAB — COMPREHENSIVE METABOLIC PANEL
ALBUMIN: 3.6 g/dL (ref 3.5–5.0)
ALK PHOS: 75 U/L (ref 38–126)
ALT: 14 U/L (ref 14–54)
ANION GAP: 6 (ref 5–15)
AST: 18 U/L (ref 15–41)
BILIRUBIN TOTAL: 0.6 mg/dL (ref 0.3–1.2)
BUN: 18 mg/dL (ref 6–20)
CALCIUM: 9 mg/dL (ref 8.9–10.3)
CO2: 29 mmol/L (ref 22–32)
CREATININE: 0.75 mg/dL (ref 0.44–1.00)
Chloride: 106 mmol/L (ref 101–111)
GFR calc Af Amer: >60 mL/min (ref >60)
GFR calc non Af Amer: >60 mL/min (ref >60)
GLUCOSE: 134 mg/dL — AB (ref 65–99)
Potassium: 3.5 mmol/L (ref 3.5–5.1)
Sodium: 141 mmol/L (ref 135–145)
TOTAL PROTEIN: 6.4 g/dL — AB (ref 6.5–8.1)

## 2015-11-02 LAB — CBC WITH DIFFERENTIAL/PLATELET
Basophils Absolute: 0 10*3/uL (ref 0.0–0.1)
Basophils Relative: 1 %
Eosinophils Absolute: 0.2 10*3/uL (ref 0.0–0.7)
Eosinophils Relative: 4 %
HEMATOCRIT: 40.6 % (ref 36.0–46.0)
HEMOGLOBIN: 13.1 g/dL (ref 12.0–15.0)
LYMPHS ABS: 1.6 10*3/uL (ref 0.7–4.0)
Lymphocytes Relative: 27 %
MCH: 28.4 pg (ref 26.0–34.0)
MCHC: 32.3 g/dL (ref 30.0–36.0)
MCV: 88.1 fL (ref 78.0–100.0)
MONOS PCT: 8 %
Monocytes Absolute: 0.5 10*3/uL (ref 0.1–1.0)
NEUTROS ABS: 3.7 10*3/uL (ref 1.7–7.7)
NEUTROS PCT: 60 %
Platelets: 213 10*3/uL (ref 150–400)
RBC: 4.61 MIL/uL (ref 3.87–5.11)
RDW: 13.6 % (ref 11.5–15.5)
WBC: 6 10*3/uL (ref 4.0–10.5)

## 2015-11-02 MED ORDER — SODIUM CHLORIDE 0.9% FLUSH
10.0000 mL | INTRAVENOUS | Status: AC | PRN
  Administered 2015-11-02: 10 mL via INTRAVENOUS
  Filled 2015-11-02: qty 10

## 2015-11-02 MED ORDER — HEPARIN SOD (PORK) LOCK FLUSH 100 UNIT/ML IV SOLN
500.0000 [IU] | Freq: Once | INTRAVENOUS | Status: AC
  Administered 2015-11-02: 500 [IU] via INTRAVENOUS

## 2015-11-02 NOTE — Progress Notes (Unsigned)
Tiffany Sweeney presented for Portacath access and flush. Proper placement of portacath confirmed by CXR. Portacath located rt chest wall accessed with  H 20 needle. Good blood return present. Portacath flushed with 31ml NS and 500U/70ml Heparin and needle removed intact. Procedure without incident. Patient tolerated procedure well.

## 2015-11-02 NOTE — Patient Instructions (Signed)
[place] at [facility] Discharge Instructions  RECOMMENDATIONS MADE BY THE CONSULTANT AND ANY TEST RESULTS WILL BE SENT TO YOUR REFERRING PHYSICIAN.  Return every 8 weeks for port flush  Return in 4 months to see Dr. Whitney Muse (labs via port in 4 months)   Thank you for choosing [place] at [facility] to provide your oncology and hematology care.  To afford each patient quality time with our provider, please arrive at least 15 minutes before your scheduled appointment time.   Beginning January 23rd 2017 lab work for the [facility] will be done in the  Main lab at [facility] on 1st floor. If you have a lab appointment with the [facility] please come in thru the  Main Entrance and check in at the main information desk  You need to re-schedule your appointment should you arrive 10 or more minutes late.  We strive to give you quality time with our providers, and arriving late affects you and other patients whose appointments are after yours.  Also, if you no show three or more times for appointments you may be dismissed from the clinic at the providers discretion.     Again, thank you for choosing [facility].  Our hope is that these requests will decrease the amount of time that you wait before being seen by our physicians.       _____________________________________________________________  Should you have questions after your visit to [facility], please contact our office at [phone] between the hours of 8:30 a.m. and 4:30 p.m.  Voicemails left after 4:30 p.m. will not be returned until the following business day.  For prescription refill requests, have your pharmacy contact our office.         Resources For Cancer Patients and their Caregivers ? American Cancer Society: Can assist with transportation, wigs, general needs, runs Look Good Feel Better.        (641) 424-4711 ? Cancer Care: Provides  financial assistance, online support groups, medication/co-pay assistance.  1-800-813-HOPE (437) 440-9006) ? [facility] Assists [place] cancer patients and their families through emotional , educational and financial support.  (306) 789-1113 ? [place] DSS Where to apply for food stamps, Medicaid and utility assistance. 629-312-3741 ? RCATS: Transportation to medical appointments. 4244655084 ? Social Security Administration: May apply for disability if have a Stage IV cancer. (818)737-2891 581-394-9951 ? [employer], Disability and Transit Services: Assists with nutrition, care and transit needs. [place] Support Programs: @10RELATIVEDAYS @ > Cancer Support Group  2nd Tuesday of the month 1pm-2pm, Journey Room  > Creative Journey  3rd Tuesday of the month 1130am-1pm, Journey Room  > Look Good Feel Better  1st Wednesday of the month 10am-12 noon, Journey Room (Call [place] to register [phone])

## 2015-11-02 NOTE — Progress Notes (Signed)
[name], MD [address] [place] [address]  Stage I triple negative carcinoma of the left breast s/p mastectomy and axillary LN disssection. History of Carcinoma of the R breast    Breast cancer ([place])   07/25/2013 Imaging    Plain film of the lumbar spine, complete 4 view. Chronic lumbar degenerative change most prominent at L4-5. No acute abnormality and no change from prior study      10/21/2013 Initial Diagnosis    Left breast cancer, core biopsy, ER negative, PR negative, HER-2/neu negative, Ki-67 of 13%, infiltrating duct cell      11/04/2013 Surgery    Left modified radical mastectomy, 1.8 cm infiltrating duct cell carcinoma, ER negative, PR negative, HER-2/neu not overexpressed, 9 lymph nodes negative. Stage TI cN0 M0.      12/06/2013 Imaging    Portable chest x-ray showing no pneumothorax no lung edema or consolidation Port-A-Cath tip in the SVC.      12/23/2013 - 02/03/2014 Chemotherapy    Cycle 1 of TC initiated on 12/23/2013, patient completed 3 cycles of prescirbed therapy        CURRENT THERAPY: Observation  INTERVAL HISTORY: Ellah Otte 81 y.o. female returns for follow-up of her triple negative breast cancer of the L breast. She is doing quite well.   Ms. Yassin returns to the [place] today unaccompanied.  She underwent a right total hip replacement on 08/05/2015. She hasn't had any hip pain since surgery. She can walk, but can't pick anything up off of the floor. The patient is no longer in physical therapy. She is really pleased with how well she is doing. She is finally beginning to feel like her old self again.   She was given fluid pills for leg swelling after surgery. She was told to go back in 2 weeks if they did not work, but she believes that they have helped.   Today she reports having no energy. She has a normal appetite. The patient reports normal bowels, except for constipation, which she is taking Colace for.    She is experiencing mild neuropathy in her feet from the chemotherapy. "no pain, but it's there". She denies stumbling or falling.  She denies any chest wall pain or lumps.  No other complaints or concerns. She is looking forward to the holidays.    MEDICAL HISTORY: Past Medical History:  Diagnosis Date  . Anxiety   . Arthritis   . Breast cancer ([place])   . Cancer [facility])    breast- right  . Family history of colon cancer   . Family history of ovarian cancer   . Family history of pancreatic cancer   . High cholesterol   . Hypertension   . Skin cancer    Patient stated  she has low grade skin cancer in right hand    has Low back pain; Breast cancer ([place]); Family history of ovarian cancer; Family history of colon cancer; Family history of pancreatic cancer; Genetic testing; OA (osteoarthritis) of hip; Health care maintenance; and Blood glucose elevated on her problem list.     has No Known Allergies.  Ms. Biasi had no medications administered during this visit.  SURGICAL HISTORY: Past Surgical History:  Procedure Laterality Date  . ABDOMINAL HYSTERECTOMY    . CATARACT EXTRACTION W/PHACO Left 03/11/2013   Procedure: CATARACT EXTRACTION PHACO AND INTRAOCULAR LENS PLACEMENT (IOC);  Surgeon: Tonny Branch, MD;  Location: [facility];  Service: Ophthalmology;  Laterality: Left;  CDE:7.54  .  CATARACT EXTRACTION W/PHACO Right 04/15/2013   Procedure: CATARACT EXTRACTION PHACO AND INTRAOCULAR LENS PLACEMENT (IOC);  Surgeon: Tonny Branch, MD;  Location: [facility];  Service: Ophthalmology;  Laterality: Right;  CDE 9.38  . MASTECTOMY Right   . MASTECTOMY MODIFIED RADICAL Left 11/04/2013   Procedure: LEFT MASTECTOMY MODIFIED RADICAL;  Surgeon: Jamesetta So, MD;  Location: [facility] ORS;  Service: General;  Laterality: Left;  . PORTACATH PLACEMENT Right 12/06/2013   Procedure: INSERTION PORT-A-CATH-Right subclavian;  Surgeon: Jamesetta So, MD;  Location: [facility] ORS;  Service: General;  Laterality: Right;  .  TOTAL HIP ARTHROPLASTY Right 08/05/2015   Procedure: RIGHT TOTAL HIP ARTHROPLASTY ANTERIOR APPROACH;  Surgeon: Gaynelle Arabian, MD;  Location: [facility] ORS;  Service: Orthopedics;  Laterality: Right;    SOCIAL HISTORY: Social History   Social History  . Marital status: Widowed    Spouse name: N/A  . Number of children: 3  . Years of education: N/A   Occupational History  . Not on file.   Social History Main Topics  . Smoking status: Former Smoker    Packs/day: 0.25    Years: 30.00    Types: Cigarettes    Start date: 10/10/1956    Quit date: 03/08/1985  . Smokeless tobacco: Never Used     Comment: quit 25 years ago  . Alcohol use No  . Drug use: No  . Sexual activity: Yes    Birth control/ protection: Post-menopausal   Other Topics Concern  . Not on file   Social History Narrative  . No narrative on file    FAMILY HISTORY: Family History  Problem Relation Age of Onset  . Diabetes Mother   . Rectal cancer Mother 46    died from complications of DM  . Dementia Father     Died at 60 in an accident  . Pancreatic cancer Brother 106  . Lung cancer Sister 51    "small oat cell"  . Ovarian cancer Sister 11  . Cancer Cousin     maternal 1st cousin with either ovarian or uterine  . Colon cancer Cousin     paternal first cousin dx at unknown age    Review of Systems  Constitutional: Positive for malaise/fatigue.  HENT: Negative.   Eyes: Negative.   Respiratory: Negative.   Cardiovascular: Negative.   Gastrointestinal: Positive for constipation.       Constipation managed with Colace  Genitourinary: Negative.   Musculoskeletal: Negative.  Negative for falls.       Chronic LE swelling  Skin: Negative.   Neurological: Positive for tingling.  Endo/Heme/Allergies: Negative.   Psychiatric/Behavioral: Negative.   All other systems reviewed and are negative. 14 point review of systems was performed and is negative except as detailed under history of present illness and  above    PHYSICAL EXAMINATION  ECOG PERFORMANCE STATUS: 1 - Symptomatic but completely ambulatory  Vitals:   11/02/15 1037  BP: (!) 157/74  Pulse: 68  Resp: 20  Temp: 97.9 F (36.6 C)     Physical Exam  Constitutional: She is oriented to person, place, and time and well-developed, well-nourished, and in no distress.  Was able to get on exam table today without assistance. Gait is markedly improved.  HENT:  Head: Normocephalic and atraumatic.  Nose: Nose normal.  Mouth/Throat: Oropharynx is clear and moist. No oropharyngeal exudate.  Eyes: Conjunctivae and EOM are normal. Pupils are equal, round, and reactive to light. Right eye exhibits no discharge. Left eye exhibits no discharge.  No scleral icterus.  Neck: Normal range of motion. Neck supple. No tracheal deviation present. No thyromegaly present.  Cardiovascular: Normal rate, regular rhythm and normal heart sounds.  Exam reveals no gallop and no friction rub.   No murmur heard. Pulmonary/Chest: Effort normal and breath sounds normal. She has no wheezes. She has no rales.  Abdominal: Soft. Bowel sounds are normal. She exhibits no distension and no mass. There is no tenderness. There is no rebound and no guarding.  Musculoskeletal: Normal range of motion. She exhibits edema.  Mild Left lower extremity ankle swelling is chronic   Lymphadenopathy:    She has no cervical adenopathy.  Neurological: She is alert and oriented to person, place, and time. She has normal reflexes. No cranial nerve deficit. Gait normal. Coordination normal.  Skin: Skin is warm and dry. No rash noted.  Psychiatric: Mood, memory, affect and judgment normal.  Nursing note and vitals reviewed.    LABORATORY DATA: I have reviewed the data below as listed.  Results for TREASA, BRADSHAW (MRN [id number])   Ref. Range 11/02/2015 10:47 11/02/2015 11:30  Sodium Latest Ref Range: 135 - 145 mmol/L 141   Potassium Latest Ref Range: 3.5 - 5.1 mmol/L 3.5    Chloride Latest Ref Range: 101 - 111 mmol/L 106   CO2 Latest Ref Range: 22 - 32 mmol/L 29   Mean Plasma Glucose Latest Units: mg/dL  111  BUN Latest Ref Range: 6 - 20 mg/dL 18   Creatinine Latest Ref Range: 0.44 - 1.00 mg/dL 0.75   Calcium Latest Ref Range: 8.9 - 10.3 mg/dL 9.0   EGFR (Non-African Amer.) Latest Ref Range: >60 mL/min >60   EGFR (African American) Latest Ref Range: >60 mL/min >60   Glucose Latest Ref Range: 65 - 99 mg/dL 134 (H)   Anion gap Latest Ref Range: 5 - 15  6   Alkaline Phosphatase Latest Ref Range: 38 - 126 U/L 75   Albumin Latest Ref Range: 3.5 - 5.0 g/dL 3.6   AST Latest Ref Range: 15 - 41 U/L 18   ALT Latest Ref Range: 14 - 54 U/L 14   Total Protein Latest Ref Range: 6.5 - 8.1 g/dL 6.4 (L)   Total Bilirubin Latest Ref Range: 0.3 - 1.2 mg/dL 0.6   WBC Latest Ref Range: 4.0 - 10.5 K/uL 6.0   RBC Latest Ref Range: 3.87 - 5.11 MIL/uL 4.61   Hemoglobin Latest Ref Range: 12.0 - 15.0 g/dL 13.1   HCT Latest Ref Range: 36.0 - 46.0 % 40.6   MCV Latest Ref Range: 78.0 - 100.0 fL 88.1   MCH Latest Ref Range: 26.0 - 34.0 pg 28.4   MCHC Latest Ref Range: 30.0 - 36.0 g/dL 32.3   RDW Latest Ref Range: 11.5 - 15.5 % 13.6   Platelets Latest Ref Range: 150 - 400 K/uL 213   Neutrophils Latest Units: % 60   Lymphocytes Latest Units: % 27   Monocytes Relative Latest Units: % 8   Eosinophil Latest Units: % 4   Basophil Latest Units: % 1   NEUT# Latest Ref Range: 1.7 - 7.7 K/uL 3.7   Lymphocyte # Latest Ref Range: 0.7 - 4.0 K/uL 1.6   Monocyte # Latest Ref Range: 0.1 - 1.0 K/uL 0.5   Eosinophils Absolute Latest Ref Range: 0.0 - 0.7 K/uL 0.2   Basophils Absolute Latest Ref Range: 0.0 - 0.1 K/uL 0.0      RADIOGRAPHIC STUDIES: I have personally reviewed the radiological images as listed and  agreed with the findings in the report. CLINICAL DATA:  Postop right total hip replacement  EXAM: PORTABLE PELVIS 1-2 VIEWS  COMPARISON:  None.  FINDINGS: Changes of right hip  replacement. Normal AP alignment. No hardware or bony complicating feature noted. Soft tissue drain in place. Moderate degenerative changes in the left hip.  IMPRESSION: Right hip replacement.  No visible complicating feature.   Electronically Signed   By: Rolm Baptise M.D.   On: 08/05/2015 10:39  ASSESSMENT and THERAPY PLAN:   Stage I triple negative carcinoma of the left breast Mastectomy 3 cycles of Taxotere and Cytoxan, discontinued prior to cycle 4 secondary to excessive fatigue, hand/foot syndrome and worsening neuropathy History of R breast cancer Family history of ovarian cancer Osteopenia Vulvar irritation Genetic Testing Negative 04/2014  She has a history of bilateral breast cancers. Currently without obvious recurrence.  She is to continue on calcium and vitamin D for her osteopenia. I have encouraged ongoing physical activity.   She has undergone a total hip and is doing great.    Labs were drawn during her visit today. A1C was added. She will return for follow up in 4 months with labs.  Orders Placed This Encounter  Procedures  . CBC with Differential    Standing Status:   Future    Standing Expiration Date:   11/01/2016  . Comprehensive metabolic panel    Standing Status:   Future    Standing Expiration Date:   11/01/2016    All questions were answered. The patient knows to call the clinic with any problems, questions or concerns. We can certainly see the patient much sooner if necessary.   This document serves as a record of services personally performed by Ancil Linsey, MD. It was created on her behalf by Martinique Casey, a trained medical scribe. The creation of this record is based on the scribe's personal observations and the provider's statements to them. This document has been checked and approved by the attending provider.  I have reviewed the above documentation for accuracy and completeness, and I agree with the above.  This note was  electronically signed.  Kelby Fam. Whitney Muse, MD

## 2015-11-03 LAB — HEMOGLOBIN A1C
HEMOGLOBIN A1C: 5.5 % (ref 4.8–5.6)
MEAN PLASMA GLUCOSE: 111 mg/dL

## 2015-11-05 ENCOUNTER — Encounter (HOSPITAL_COMMUNITY): Payer: Self-pay | Admitting: Hematology & Oncology

## 2015-12-29 ENCOUNTER — Encounter (HOSPITAL_COMMUNITY): Payer: Medicare Other

## 2015-12-31 ENCOUNTER — Other Ambulatory Visit: Payer: Self-pay | Admitting: Nurse Practitioner

## 2016-01-07 ENCOUNTER — Encounter (HOSPITAL_COMMUNITY): Payer: Medicare Other | Attending: Family Medicine

## 2016-01-07 ENCOUNTER — Encounter (HOSPITAL_COMMUNITY): Payer: Self-pay

## 2016-01-07 DIAGNOSIS — H1013 Acute atopic conjunctivitis, bilateral: Secondary | ICD-10-CM | POA: Insufficient documentation

## 2016-01-07 DIAGNOSIS — Z853 Personal history of malignant neoplasm of breast: Secondary | ICD-10-CM | POA: Diagnosis not present

## 2016-01-07 DIAGNOSIS — Z452 Encounter for adjustment and management of vascular access device: Secondary | ICD-10-CM | POA: Diagnosis present

## 2016-01-07 MED ORDER — HEPARIN SOD (PORK) LOCK FLUSH 100 UNIT/ML IV SOLN
500.0000 [IU] | Freq: Once | INTRAVENOUS | Status: AC
  Administered 2016-01-07: 500 [IU] via INTRAVENOUS
  Filled 2016-01-07: qty 5

## 2016-01-07 MED ORDER — SODIUM CHLORIDE 0.9% FLUSH
10.0000 mL | INTRAVENOUS | Status: DC | PRN
  Administered 2016-01-07: 10 mL via INTRAVENOUS
  Filled 2016-01-07: qty 10

## 2016-01-07 NOTE — Progress Notes (Signed)
Tiffany Sweeney presented for Portacath access and flush. Portacath located right chest wall accessed with  H 20 needle. Good blood return present. Portacath flushed with 51ml NS and 500U/70ml Heparin and needle removed intact. Procedure without incident. Patient tolerated procedure well.  Vitals stable and discharged from clinic ambulatory.

## 2016-01-07 NOTE — Patient Instructions (Signed)
[facility] at [facility] Discharge Instructions  RECOMMENDATIONS MADE BY THE CONSULTANT AND ANY TEST RESULTS WILL BE SENT TO YOUR REFERRING PHYSICIAN.  Port flush today  Follow up as scheduled.  Thank you for choosing [facility] at [facility] to provide your oncology and hematology care.  To afford each patient quality time with our provider, please arrive at least 15 minutes before your scheduled appointment time.    If you have a lab appointment with the Cancer Center please come in thru the  Main Entrance and check in at the main information desk  You need to re-schedule your appointment should you arrive 10 or more minutes late.  We strive to give you quality time with our providers, and arriving late affects you and other patients whose appointments are after yours.  Also, if you no show three or more times for appointments you may be dismissed from the clinic at the providers discretion.     Again, thank you for choosing [facility].  Our hope is that these requests will decrease the amount of time that you wait before being seen by our physicians.       _____________________________________________________________  Should you have questions after your visit to  Cancer Center, please contact our office at [phone] between the hours of 8:30 a.m. and 4:30 p.m.  Voicemails left after 4:30 p.m. will not be returned until the following business day.  For prescription refill requests, have your pharmacy contact our office.       Resources For Cancer Patients and their Caregivers ? American Cancer Society: Can assist with transportation, wigs, general needs, runs Look Good Feel Better.        1-888-227-6333 ? Cancer Care: Provides financial assistance, online support groups, medication/co-pay assistance.  1-800-813-HOPE (4673) ? [facility] Assists [place] cancer patients and their  families through emotional , educational and financial support.  336-427-4357 ? [place] DSS Where to apply for food stamps, Medicaid and utility assistance. 336-342-1394 ? [employer]: Transportation to medical appointments. 336-347-2287 ? Social Security Administration: May apply for disability if have a Stage IV cancer. 336-342-7796 1-800-772-1213 ? [place] Aging, Disability and Transit Services: Assists with nutrition, care and transit needs. 336-349-2343  Cancer Center Support Programs: @10RELATIVEDAYS@ > Cancer Support Group  2nd Tuesday of the month 1pm-2pm, Journey Room  > Creative Journey  3rd Tuesday of the month 1130am-1pm, Journey Room  > Look Good Feel Better  1st Wednesday of the month 10am-12 noon, Journey Room (Call American Cancer Society to register 1-800-395-5775)   

## 2016-02-22 ENCOUNTER — Other Ambulatory Visit (HOSPITAL_COMMUNITY): Payer: Medicare Other

## 2016-02-22 ENCOUNTER — Ambulatory Visit (HOSPITAL_COMMUNITY): Payer: Medicare Other

## 2016-03-01 ENCOUNTER — Encounter (HOSPITAL_COMMUNITY): Payer: Self-pay

## 2016-03-01 ENCOUNTER — Encounter (HOSPITAL_BASED_OUTPATIENT_CLINIC_OR_DEPARTMENT_OTHER): Payer: Medicare Other

## 2016-03-01 ENCOUNTER — Encounter (HOSPITAL_COMMUNITY): Payer: Medicare Other | Attending: Family Medicine | Admitting: Oncology

## 2016-03-01 VITALS — BP 172/94 | HR 63 | Temp 97.9°F | Resp 16 | Wt 171.2 lb

## 2016-03-01 DIAGNOSIS — M8580 Other specified disorders of bone density and structure, unspecified site: Secondary | ICD-10-CM | POA: Diagnosis not present

## 2016-03-01 DIAGNOSIS — H1013 Acute atopic conjunctivitis, bilateral: Secondary | ICD-10-CM | POA: Diagnosis present

## 2016-03-01 DIAGNOSIS — Z853 Personal history of malignant neoplasm of breast: Secondary | ICD-10-CM | POA: Diagnosis present

## 2016-03-01 DIAGNOSIS — C50412 Malignant neoplasm of upper-outer quadrant of left female breast: Secondary | ICD-10-CM

## 2016-03-01 DIAGNOSIS — R600 Localized edema: Secondary | ICD-10-CM | POA: Diagnosis not present

## 2016-03-01 DIAGNOSIS — Z8041 Family history of malignant neoplasm of ovary: Secondary | ICD-10-CM

## 2016-03-01 LAB — CBC WITH DIFFERENTIAL/PLATELET
Basophils Absolute: 0 10*3/uL (ref 0.0–0.1)
Basophils Relative: 1 %
EOS PCT: 2 %
Eosinophils Absolute: 0.1 10*3/uL (ref 0.0–0.7)
HCT: 40.6 % (ref 36.0–46.0)
Hemoglobin: 13.8 g/dL (ref 12.0–15.0)
LYMPHS ABS: 1.5 10*3/uL (ref 0.7–4.0)
Lymphocytes Relative: 24 %
MCH: 29.9 pg (ref 26.0–34.0)
MCHC: 34 g/dL (ref 30.0–36.0)
MCV: 87.9 fL (ref 78.0–100.0)
MONOS PCT: 8 %
Monocytes Absolute: 0.5 10*3/uL (ref 0.1–1.0)
NEUTROS PCT: 65 %
Neutro Abs: 4 10*3/uL (ref 1.7–7.7)
Platelets: 198 10*3/uL (ref 150–400)
RBC: 4.62 MIL/uL (ref 3.87–5.11)
RDW: 13.7 % (ref 11.5–15.5)
WBC: 6 10*3/uL (ref 4.0–10.5)

## 2016-03-01 LAB — COMPREHENSIVE METABOLIC PANEL
ALK PHOS: 66 U/L (ref 38–126)
ALT: 16 U/L (ref 14–54)
AST: 21 U/L (ref 15–41)
Albumin: 3.8 g/dL (ref 3.5–5.0)
Anion gap: 5 (ref 5–15)
BILIRUBIN TOTAL: 0.6 mg/dL (ref 0.3–1.2)
BUN: 16 mg/dL (ref 6–20)
CALCIUM: 9.3 mg/dL (ref 8.9–10.3)
CO2: 30 mmol/L (ref 22–32)
CREATININE: 0.68 mg/dL (ref 0.44–1.00)
Chloride: 104 mmol/L (ref 101–111)
GFR calc non Af Amer: >60 mL/min (ref >60)
Glucose, Bld: 94 mg/dL (ref 65–99)
Potassium: 3.8 mmol/L (ref 3.5–5.1)
Sodium: 139 mmol/L (ref 135–145)
TOTAL PROTEIN: 6.6 g/dL (ref 6.5–8.1)

## 2016-03-01 MED ORDER — HEPARIN SOD (PORK) LOCK FLUSH 100 UNIT/ML IV SOLN
INTRAVENOUS | Status: AC
  Filled 2016-03-01: qty 5

## 2016-03-01 MED ORDER — SODIUM CHLORIDE 0.9% FLUSH
20.0000 mL | INTRAVENOUS | Status: DC | PRN
  Administered 2016-03-01: 20 mL via INTRAVENOUS
  Filled 2016-03-01: qty 20

## 2016-03-01 MED ORDER — HEPARIN SOD (PORK) LOCK FLUSH 100 UNIT/ML IV SOLN
500.0000 [IU] | Freq: Once | INTRAVENOUS | Status: AC
  Administered 2016-03-01: 500 [IU] via INTRAVENOUS

## 2016-03-01 NOTE — Progress Notes (Signed)
Tiffany Bellow, MD [address] [place] [address]  Progress Note  Stage I triple negative carcinoma of the left breast s/p mastectomy and axillary LN disssection. History of Carcinoma of the R breast    Breast cancer ([place])   07/25/2013 Imaging    Plain film of the lumbar spine, complete 4 view. Chronic lumbar degenerative change most prominent at L4-5. No acute abnormality and no change from prior study      10/21/2013 Initial Diagnosis    Left breast cancer, core biopsy, ER negative, PR negative, HER-2/neu negative, Ki-67 of 13%, infiltrating duct cell      11/04/2013 Surgery    Left modified radical mastectomy, 1.8 cm infiltrating duct cell carcinoma, ER negative, PR negative, HER-2/neu not overexpressed, 9 lymph nodes negative. Stage TI cN0 M0.      12/06/2013 Imaging    Portable chest x-ray showing no pneumothorax no lung edema or consolidation Port-A-Cath tip in the SVC.      12/23/2013 - 02/03/2014 Chemotherapy    Cycle 1 of TC initiated on 12/23/2013, patient completed 3 cycles of prescirbed therapy        CURRENT THERAPY: Observation  INTERVAL HISTORY: Tiffany Sweeney 82 y.o. female returns for follow-up of her triple negative breast cancer of the L breast.   Tiffany Sweeney returns to the [facility] today unaccompanied. She states overall she is doing well except for edema in her bilateral legs. She's had persistent leg edema since the 1960s, worse in her left leg compared to the right. She was given lasix PRN by her PCP however she has not been taking it as frequently due to concerns of possible kidney injury. She has not palpated any subcutaneous nodules on her chest wall or axillary lympadenopathy. Otherwise ROS was negative.    MEDICAL HISTORY: Past Medical History:  Diagnosis Date  . Anxiety   . Arthritis   . Breast cancer ([place])   . Cancer [facility])    breast- right  . Family history of colon cancer   . Family history of ovarian cancer    . Family history of pancreatic cancer   . High cholesterol   . Hypertension   . Skin cancer    Patient stated  she has low grade skin cancer in right hand    has Low back pain; Breast cancer ([place]); Family history of ovarian cancer; Family history of colon cancer; Family history of pancreatic cancer; Genetic testing; OA (osteoarthritis) of hip; Health care maintenance; and Blood glucose elevated on her problem list.     has No Known Allergies.  Tiffany Sweeney had no medications administered during this visit.  SURGICAL HISTORY: Past Surgical History:  Procedure Laterality Date  . ABDOMINAL HYSTERECTOMY    . CATARACT EXTRACTION W/PHACO Left 03/11/2013   Procedure: CATARACT EXTRACTION PHACO AND INTRAOCULAR LENS PLACEMENT (IOC);  Surgeon: Tonny Branch, MD;  Location: [facility];  Service: Ophthalmology;  Laterality: Left;  CDE:7.54  . CATARACT EXTRACTION W/PHACO Right 04/15/2013   Procedure: CATARACT EXTRACTION PHACO AND INTRAOCULAR LENS PLACEMENT (IOC);  Surgeon: Tonny Branch, MD;  Location: [facility];  Service: Ophthalmology;  Laterality: Right;  CDE 9.38  . MASTECTOMY Right   . MASTECTOMY MODIFIED RADICAL Left 11/04/2013   Procedure: LEFT MASTECTOMY MODIFIED RADICAL;  Surgeon: Jamesetta So, MD;  Location: [facility];  Service: General;  Laterality: Left;  . PORTACATH PLACEMENT Right 12/06/2013   Procedure: INSERTION PORT-A-CATH-Right subclavian;  Surgeon: Jamesetta So, MD;  Location: [facility];  Service: General;  Laterality: Right;  . TOTAL HIP ARTHROPLASTY Right 08/05/2015   Procedure: RIGHT TOTAL HIP ARTHROPLASTY ANTERIOR APPROACH;  Surgeon: Gaynelle Arabian, MD;  Location: [facility];  Service: Orthopedics;  Laterality: Right;    SOCIAL HISTORY: Social History   Social History  . Marital status: Widowed    Spouse name: N/A  . Number of children: 3  . Years of education: N/A   Occupational History  . Not on file.   Social History Main Topics  . Smoking status: Former Smoker    Packs/day: 0.25     Years: 30.00    Types: Cigarettes    Start date: 10/10/1956    Quit date: 03/08/1985  . Smokeless tobacco: Never Used     Comment: quit 25 years ago  . Alcohol use No  . Drug use: No  . Sexual activity: Yes    Birth control/ protection: Post-menopausal   Other Topics Concern  . Not on file   Social History Narrative  . No narrative on file    FAMILY HISTORY: Family History  Problem Relation Age of Onset  . Diabetes Mother   . Rectal cancer Mother 24    died from complications of DM  . Dementia Father     Died at 56 in an accident  . Pancreatic cancer Brother 37  . Lung cancer Sister 78    "small oat cell"  . Ovarian cancer Sister 1  . Cancer Cousin     maternal 1st cousin with either ovarian or uterine  . Colon cancer Cousin     paternal first cousin dx at unknown age    Review of Systems  Constitutional: Negative.   HENT: Negative.   Eyes: Negative.   Respiratory: Negative.   Cardiovascular: Negative.   Gastrointestinal: Negative.   Genitourinary: Negative.   Musculoskeletal: Negative.   Skin: Negative.   Neurological: Negative.   Endo/Heme/Allergies: Negative.   Psychiatric/Behavioral: Negative.   All other systems reviewed and are negative. 14 point review of systems was performed and is negative except as detailed under history of present illness and above    PHYSICAL EXAMINATION  ECOG PERFORMANCE STATUS: 1 - Symptomatic but completely ambulatory  Vitals:   03/01/16 0958  BP: (!) 172/94  Pulse: 63  Resp: 16  Temp: 97.9 F (36.6 C)      Physical Exam  Constitutional: She is oriented to person, place, and time and well-developed, well-nourished, and in no distress. No distress.  Was able to get on exam table today without assistance.   HENT:  Head: Normocephalic and atraumatic.  Nose: Nose normal.  Mouth/Throat: Oropharynx is clear and moist. No oropharyngeal exudate.  Eyes: Conjunctivae and EOM are normal. Pupils are equal, round, and  reactive to light. Right eye exhibits no discharge. Left eye exhibits no discharge. No scleral icterus.  Neck: Normal range of motion. Neck supple. No JVD present. No tracheal deviation present. No thyromegaly present.  Cardiovascular: Normal rate, regular rhythm and normal heart sounds.  Exam reveals no gallop and no friction rub.   No murmur heard. Pulmonary/Chest: Effort normal and breath sounds normal. No respiratory distress. She has no wheezes. She has no rales.  Bilateral mastectomies. No subcutaneous nodules on her chest wall. No bilateral axillary lymphadenopathy.  Abdominal: Soft. Bowel sounds are normal. She exhibits no distension and no mass. There is no tenderness. There is no rebound and no guarding.  Musculoskeletal: Normal range of motion. She exhibits edema. She exhibits no tenderness.  Bilateral  edema L > R  Lymphadenopathy:    She has no cervical adenopathy.  Neurological: She is alert and oriented to person, place, and time. She has normal reflexes. No cranial nerve deficit. Gait normal. Coordination normal.  Skin: Skin is warm and dry. No rash noted. No erythema. No pallor.  Psychiatric: Mood, memory, affect and judgment normal.  Nursing note and vitals reviewed.    LABORATORY DATA: I have reviewed the data below as listed.  Results for JORDI, KAMM (MRN [id number])   Ref. Range 11/02/2015 10:47 11/02/2015 11:30  Sodium Latest Ref Range: 135 - 145 mmol/L 141   Potassium Latest Ref Range: 3.5 - 5.1 mmol/L 3.5   Chloride Latest Ref Range: 101 - 111 mmol/L 106   CO2 Latest Ref Range: 22 - 32 mmol/L 29   Mean Plasma Glucose Latest Units: mg/dL  111  BUN Latest Ref Range: 6 - 20 mg/dL 18   Creatinine Latest Ref Range: 0.44 - 1.00 mg/dL 0.75   Calcium Latest Ref Range: 8.9 - 10.3 mg/dL 9.0   EGFR (Non-African Amer.) Latest Ref Range: >60 mL/min >60   EGFR (African American) Latest Ref Range: >60 mL/min >60   Glucose Latest Ref Range: 65 - 99 mg/dL 134 (H)   Anion  gap Latest Ref Range: 5 - 15  6   Alkaline Phosphatase Latest Ref Range: 38 - 126 U/L 75   Albumin Latest Ref Range: 3.5 - 5.0 g/dL 3.6   AST Latest Ref Range: 15 - 41 U/L 18   ALT Latest Ref Range: 14 - 54 U/L 14   Total Protein Latest Ref Range: 6.5 - 8.1 g/dL 6.4 (L)   Total Bilirubin Latest Ref Range: 0.3 - 1.2 mg/dL 0.6   WBC Latest Ref Range: 4.0 - 10.5 K/uL 6.0   RBC Latest Ref Range: 3.87 - 5.11 MIL/uL 4.61   Hemoglobin Latest Ref Range: 12.0 - 15.0 g/dL 13.1   HCT Latest Ref Range: 36.0 - 46.0 % 40.6   MCV Latest Ref Range: 78.0 - 100.0 fL 88.1   MCH Latest Ref Range: 26.0 - 34.0 pg 28.4   MCHC Latest Ref Range: 30.0 - 36.0 g/dL 32.3   RDW Latest Ref Range: 11.5 - 15.5 % 13.6   Platelets Latest Ref Range: 150 - 400 K/uL 213   Neutrophils Latest Units: % 60   Lymphocytes Latest Units: % 27   Monocytes Relative Latest Units: % 8   Eosinophil Latest Units: % 4   Basophil Latest Units: % 1   NEUT# Latest Ref Range: 1.7 - 7.7 K/uL 3.7   Lymphocyte # Latest Ref Range: 0.7 - 4.0 K/uL 1.6   Monocyte # Latest Ref Range: 0.1 - 1.0 K/uL 0.5   Eosinophils Absolute Latest Ref Range: 0.0 - 0.7 K/uL 0.2   Basophils Absolute Latest Ref Range: 0.0 - 0.1 K/uL 0.0      RADIOGRAPHIC STUDIES: I have personally reviewed the radiological images as listed and agreed with the findings in the report. CLINICAL DATA:  Postop right total hip replacement  EXAM: PORTABLE PELVIS 1-2 VIEWS  COMPARISON:  None.  FINDINGS: Changes of right hip replacement. Normal AP alignment. No hardware or bony complicating feature noted. Soft tissue drain in place. Moderate degenerative changes in the left hip.  IMPRESSION: Right hip replacement.  No visible complicating feature.   Electronically Signed   By: Rolm Baptise M.D.   On: 08/05/2015 10:39  ASSESSMENT and THERAPY PLAN:   Stage I triple negative carcinoma of  the left breast Mastectomy 3 cycles of Taxotere and Cytoxan, discontinued prior  to cycle 4 secondary to excessive fatigue, hand/foot syndrome and worsening neuropathy History of R breast cancer Family history of ovarian cancer Osteopenia Vulvar irritation Genetic Testing Negative 04/2014  She has a history of bilateral breast cancers. Currently without obvious recurrence.  She is to continue on calcium and vitamin D for her osteopenia. I have encouraged ongoing physical activity.   I have recommended for her to take her lasix PO daily for her leg edema. She will follow up with her PCP next month for monitoring of her renal function while on lasix.  RTC in 6 months for follow up with CBC, CMP.     All questions were answered. The patient knows to call the clinic with any problems, questions or concerns. We can certainly see the patient much sooner if necessary.   This document serves as a record of services personally performed by Twana First, MD. It was created on her behalf by Shirlean Mylar, a trained medical scribe. The creation of this record is based on the scribe's personal observations and the provider's statements to them. This document has been checked and approved by the attending provider.   I have reviewed the above documentation for accuracy and completeness, and I agree with the above.  This note was electronically signed.

## 2016-03-01 NOTE — Patient Instructions (Signed)
[place] at [facility] Discharge Instructions  RECOMMENDATIONS MADE BY THE CONSULTANT AND ANY TEST RESULTS WILL BE SENT TO YOUR REFERRING PHYSICIAN.  Blood drawn for labs ordered form portacath then flushed per protocol today. Follow-up as scheduled. Call clinic for any questions or concerns  Thank you for choosing [place] at [facility] to provide your oncology and hematology care.  To afford each patient quality time with our provider, please arrive at least 15 minutes before your scheduled appointment time.    If you have a lab appointment with the [facility] please come in thru the  Main Entrance and check in at the main information desk  You need to re-schedule your appointment should you arrive 10 or more minutes late.  We strive to give you quality time with our providers, and arriving late affects you and other patients whose appointments are after yours.  Also, if you no show three or more times for appointments you may be dismissed from the clinic at the providers discretion.     Again, thank you for choosing [facility].  Our hope is that these requests will decrease the amount of time that you wait before being seen by our physicians.       _____________________________________________________________  Should you have questions after your visit to [facility], please contact our office at [phone] between the hours of 8:30 a.m. and 4:30 p.m.  Voicemails left after 4:30 p.m. will not be returned until the following business day.  For prescription refill requests, have your pharmacy contact our office.       Resources For Cancer Patients and their Caregivers ? American Cancer Society: Can assist with transportation, wigs, general needs, runs Look Good Feel Better.        518-378-4480 ? Cancer Care: Provides financial assistance, online support groups, medication/co-pay assistance.   1-800-813-HOPE 540-275-1961) ? [place] Assists [place] cancer patients and their families through emotional , educational and financial support.  (563)266-2672 ? [place] DSS Where to apply for food stamps, Medicaid and utility assistance. 709-078-7675 ? RCATS: Transportation to medical appointments. 657 554 4151 ? Social Security Administration: May apply for disability if have a Stage IV cancer. 812 585 2402 651-542-0401 ? [employer], Disability and Transit Services: Assists with nutrition, care and transit needs. [place] Support Programs: @10RELATIVEDAYS @ > Cancer Support Group  2nd Tuesday of the month 1pm-2pm, Journey Room  > Creative Journey  3rd Tuesday of the month 1130am-1pm, Journey Room  > Look Good Feel Better  1st Wednesday of the month 10am-12 noon, Journey Room (Call [place] to register [phone])

## 2016-03-01 NOTE — Progress Notes (Signed)
[place] Pfennig tolerated port flush with lab draw well without complaints or incident. Port accessed with 20 gauge needle with blood drawn for labs ordered then port flushed with 20 ml NS and 5 ml Heparin easily per protocol. Pt discharged self ambulatory in satisfactory condition

## 2016-03-01 NOTE — Patient Instructions (Signed)
[place] at [facility] Discharge Instructions  RECOMMENDATIONS MADE BY THE CONSULTANT AND ANY TEST RESULTS WILL BE SENT TO YOUR REFERRING PHYSICIAN.  You were seen today by Dr. Twana First Continue to get port flushed every 6-8 weeks Follow up in 6 months with lab work See Amy up front for appointments   Thank you for choosing [place] at [facility] to provide your oncology and hematology care.  To afford each patient quality time with our provider, please arrive at least 15 minutes before your scheduled appointment time.    If you have a lab appointment with the [facility] please come in thru the  Main Entrance and check in at the main information desk  You need to re-schedule your appointment should you arrive 10 or more minutes late.  We strive to give you quality time with our providers, and arriving late affects you and other patients whose appointments are after yours.  Also, if you no show three or more times for appointments you may be dismissed from the clinic at the providers discretion.     Again, thank you for choosing [facility].  Our hope is that these requests will decrease the amount of time that you wait before being seen by our physicians.       _____________________________________________________________  Should you have questions after your visit to [facility], please contact our office at [phone] between the hours of 8:30 a.m. and 4:30 p.m.  Voicemails left after 4:30 p.m. will not be returned until the following business day.  For prescription refill requests, have your pharmacy contact our office.       Resources For Cancer Patients and their Caregivers ? American Cancer Society: Can assist with transportation, wigs, general needs, runs Look Good Feel Better.        4316603797 ? Cancer Care: Provides financial assistance, online support groups, medication/co-pay  assistance.  1-800-813-HOPE (737)304-1205) ? [place] Assists [place] cancer patients and their families through emotional , educational and financial support.  407-550-3990 ? [place] DSS Where to apply for food stamps, Medicaid and utility assistance. 915 217 8693 ? RCATS: Transportation to medical appointments. 720-838-3130 ? Social Security Administration: May apply for disability if have a Stage IV cancer. 2705100789 (430)649-1746 ? [employer], Disability and Transit Services: Assists with nutrition, care and transit needs. [place] Support Programs: @10RELATIVEDAYS @ > Cancer Support Group  2nd Tuesday of the month 1pm-2pm, Journey Room  > Creative Journey  3rd Tuesday of the month 1130am-1pm, Journey Room  > Look Good Feel Better  1st Wednesday of the month 10am-12 noon, Journey Room (Call [place] to register [phone])

## 2016-04-26 ENCOUNTER — Encounter (HOSPITAL_COMMUNITY): Payer: Medicare Other | Attending: Family Medicine

## 2016-04-26 ENCOUNTER — Encounter (HOSPITAL_COMMUNITY): Payer: Self-pay

## 2016-04-26 VITALS — BP 162/79 | HR 69 | Temp 97.5°F | Resp 18

## 2016-04-26 DIAGNOSIS — Z853 Personal history of malignant neoplasm of breast: Secondary | ICD-10-CM | POA: Diagnosis not present

## 2016-04-26 DIAGNOSIS — H1013 Acute atopic conjunctivitis, bilateral: Secondary | ICD-10-CM | POA: Insufficient documentation

## 2016-04-26 DIAGNOSIS — Z95828 Presence of other vascular implants and grafts: Secondary | ICD-10-CM

## 2016-04-26 DIAGNOSIS — Z452 Encounter for adjustment and management of vascular access device: Secondary | ICD-10-CM

## 2016-04-26 MED ORDER — HEPARIN SOD (PORK) LOCK FLUSH 100 UNIT/ML IV SOLN
INTRAVENOUS | Status: AC
  Filled 2016-04-26: qty 5

## 2016-04-26 MED ORDER — SODIUM CHLORIDE 0.9% FLUSH
10.0000 mL | INTRAVENOUS | Status: DC | PRN
  Administered 2016-04-26: 10 mL via INTRAVENOUS
  Filled 2016-04-26: qty 10

## 2016-04-26 MED ORDER — HEPARIN SOD (PORK) LOCK FLUSH 100 UNIT/ML IV SOLN
500.0000 [IU] | Freq: Once | INTRAVENOUS | Status: AC
  Administered 2016-04-26: 500 [IU] via INTRAVENOUS

## 2016-04-26 NOTE — Progress Notes (Signed)
Tiffany Sweeney tolerated port flush well without complaints or incident.Port accessed with 20 gauge needle with blood return noted then flushed with 10 ml NS and 5 ml Heparin easily per protocol. VSS Pt discharged self ambulatory in satisfactory condition

## 2016-04-26 NOTE — Patient Instructions (Signed)
[facility] at [facility] Discharge Instructions  RECOMMENDATIONS MADE BY THE CONSULTANT AND ANY TEST RESULTS WILL BE SENT TO YOUR REFERRING PHYSICIAN.  Portacath flushed per protocol today. Follow-up as scheduled. Call clinic for any questions or concerns  Thank you for choosing [facility] at [facility] to provide your oncology and hematology care.  To afford each patient quality time with our provider, please arrive at least 15 minutes before your scheduled appointment time.    If you have a lab appointment with the Cancer Center please come in thru the  Main Entrance and check in at the main information desk  You need to re-schedule your appointment should you arrive 10 or more minutes late.  We strive to give you quality time with our providers, and arriving late affects you and other patients whose appointments are after yours.  Also, if you no show three or more times for appointments you may be dismissed from the clinic at the providers discretion.     Again, thank you for choosing [facility].  Our hope is that these requests will decrease the amount of time that you wait before being seen by our physicians.       _____________________________________________________________  Should you have questions after your visit to [facility], please contact our office at [phone] between the hours of 8:30 a.m. and 4:30 p.m.  Voicemails left after 4:30 p.m. will not be returned until the following business day.  For prescription refill requests, have your pharmacy contact our office.       Resources For Cancer Patients and their Caregivers ? American Cancer Society: Can assist with transportation, wigs, general needs, runs Look Good Feel Better.        1-888-227-6333 ? Cancer Care: Provides financial assistance, online support groups, medication/co-pay assistance.  1-800-813-HOPE (4673) ? [facility] Assists [place] cancer patients and their families through emotional , educational and financial support.  336-427-4357 ? [place] DSS Where to apply for food stamps, Medicaid and utility assistance. 336-342-1394 ? RCATS: Transportation to medical appointments. 336-347-2287 ? Social Security Administration: May apply for disability if have a Stage IV cancer. 336-342-7796 1-800-772-1213 ? [place] Aging, Disability and Transit Services: Assists with nutrition, care and transit needs. 336-349-2343  Cancer Center Support Programs: @10RELATIVEDAYS@ > Cancer Support Group  2nd Tuesday of the month 1pm-2pm, Journey Room  > Creative Journey  3rd Tuesday of the month 1130am-1pm, Journey Room  > Look Good Feel Better  1st Wednesday of the month 10am-12 noon, Journey Room (Call American [employer] to register 1-800-395-5775)   

## 2016-06-21 ENCOUNTER — Encounter (HOSPITAL_COMMUNITY): Payer: Self-pay

## 2016-06-21 ENCOUNTER — Encounter (HOSPITAL_COMMUNITY): Payer: Medicare Other | Attending: Family Medicine

## 2016-06-21 VITALS — BP 173/85 | HR 63 | Temp 98.2°F | Resp 19

## 2016-06-21 DIAGNOSIS — Z452 Encounter for adjustment and management of vascular access device: Secondary | ICD-10-CM

## 2016-06-21 DIAGNOSIS — Z853 Personal history of malignant neoplasm of breast: Secondary | ICD-10-CM | POA: Diagnosis not present

## 2016-06-21 DIAGNOSIS — H1013 Acute atopic conjunctivitis, bilateral: Secondary | ICD-10-CM | POA: Insufficient documentation

## 2016-06-21 DIAGNOSIS — Z95828 Presence of other vascular implants and grafts: Secondary | ICD-10-CM

## 2016-06-21 MED ORDER — HEPARIN SOD (PORK) LOCK FLUSH 100 UNIT/ML IV SOLN
500.0000 [IU] | Freq: Once | INTRAVENOUS | Status: AC
  Administered 2016-06-21: 500 [IU] via INTRAVENOUS
  Filled 2016-06-21: qty 5

## 2016-06-21 MED ORDER — SODIUM CHLORIDE 0.9% FLUSH
10.0000 mL | INTRAVENOUS | Status: DC | PRN
  Administered 2016-06-21: 10 mL via INTRAVENOUS
  Filled 2016-06-21: qty 10

## 2016-06-21 NOTE — Progress Notes (Signed)
Tiffany Sweeney presented for Portacath access and flush. Portacath located left chest wall accessed with  H 20 needle. Good blood return present. Portacath flushed with 78ml NS and 500U/5ml Heparin and needle removed intact. Procedure without incident. Patient tolerated procedure well. Vitals stable and discharged home from clinic ambulatory. Follow up as scheduled.

## 2016-06-21 NOTE — Patient Instructions (Signed)
[place] at [facility] Discharge Instructions  RECOMMENDATIONS MADE BY THE CONSULTANT AND ANY TEST RESULTS WILL BE SENT TO YOUR REFERRING PHYSICIAN.  Port flush Follow up as scheduled.  Thank you for choosing [place] at [facility] to provide your oncology and hematology care.  To afford each patient quality time with our provider, please arrive at least 15 minutes before your scheduled appointment time.    If you have a lab appointment with the [facility] please come in thru the  Main Entrance and check in at the main information desk  You need to re-schedule your appointment should you arrive 10 or more minutes late.  We strive to give you quality time with our providers, and arriving late affects you and other patients whose appointments are after yours.  Also, if you no show three or more times for appointments you may be dismissed from the clinic at the providers discretion.     Again, thank you for choosing [facility].  Our hope is that these requests will decrease the amount of time that you wait before being seen by our physicians.       _____________________________________________________________  Should you have questions after your visit to [facility], please contact our office at [phone] between the hours of 8:30 a.m. and 4:30 p.m.  Voicemails left after 4:30 p.m. will not be returned until the following business day.  For prescription refill requests, have your pharmacy contact our office.       Resources For Cancer Patients and their Caregivers ? American Cancer Society: Can assist with transportation, wigs, general needs, runs Look Good Feel Better.        401-695-5309 ? Cancer Care: Provides financial assistance, online support groups, medication/co-pay assistance.  1-800-813-HOPE 216-163-7272) ? [place] Assists [place] cancer patients and their  families through emotional , educational and financial support.  661-864-7762 ? [place] DSS Where to apply for food stamps, Medicaid and utility assistance. 432-107-2225 ? RCATS: Transportation to medical appointments. 2198459351 ? Social Security Administration: May apply for disability if have a Stage IV cancer. (913) [phone] 444 9311 ? [employer], Disability and Transit Services: Assists with nutrition, care and transit needs. [place] Support Programs: @10RELATIVEDAYS @ > Cancer Support Group  2nd Tuesday of the month 1pm-2pm, Journey Room  > Creative Journey  3rd Tuesday of the month 1130am-1pm, Journey Room  > Look Good Feel Better  1st Wednesday of the month 10am-12 noon, Journey Room (Call [place] to register [phone])

## 2016-08-16 ENCOUNTER — Ambulatory Visit (HOSPITAL_COMMUNITY): Payer: Medicare Other

## 2016-08-16 ENCOUNTER — Encounter (HOSPITAL_COMMUNITY): Payer: Medicare Other | Attending: Adult Health

## 2016-08-16 ENCOUNTER — Encounter (HOSPITAL_COMMUNITY): Payer: Self-pay

## 2016-08-16 DIAGNOSIS — C50412 Malignant neoplasm of upper-outer quadrant of left female breast: Secondary | ICD-10-CM | POA: Diagnosis present

## 2016-08-16 DIAGNOSIS — Z853 Personal history of malignant neoplasm of breast: Secondary | ICD-10-CM

## 2016-08-16 LAB — CBC WITH DIFFERENTIAL/PLATELET
BASOS ABS: 0 10*3/uL (ref 0.0–0.1)
Basophils Relative: 1 %
Eosinophils Absolute: 0.2 10*3/uL (ref 0.0–0.7)
Eosinophils Relative: 3 %
HEMATOCRIT: 40.6 % (ref 36.0–46.0)
HEMOGLOBIN: 13.6 g/dL (ref 12.0–15.0)
LYMPHS ABS: 1.5 10*3/uL (ref 0.7–4.0)
LYMPHS PCT: 27 %
MCH: 30 pg (ref 26.0–34.0)
MCHC: 33.5 g/dL (ref 30.0–36.0)
MCV: 89.6 fL (ref 78.0–100.0)
Monocytes Absolute: 0.5 10*3/uL (ref 0.1–1.0)
Monocytes Relative: 9 %
NEUTROS ABS: 3.5 10*3/uL (ref 1.7–7.7)
Neutrophils Relative %: 60 %
Platelets: 196 10*3/uL (ref 150–400)
RBC: 4.53 MIL/uL (ref 3.87–5.11)
RDW: 13.5 % (ref 11.5–15.5)
WBC: 5.7 10*3/uL (ref 4.0–10.5)

## 2016-08-16 LAB — COMPREHENSIVE METABOLIC PANEL
ALK PHOS: 58 U/L (ref 38–126)
ALT: 16 U/L (ref 14–54)
AST: 18 U/L (ref 15–41)
Albumin: 3.5 g/dL (ref 3.5–5.0)
Anion gap: 9 (ref 5–15)
BILIRUBIN TOTAL: 0.6 mg/dL (ref 0.3–1.2)
BUN: 20 mg/dL (ref 6–20)
CALCIUM: 8.8 mg/dL — AB (ref 8.9–10.3)
CHLORIDE: 105 mmol/L (ref 101–111)
CO2: 26 mmol/L (ref 22–32)
CREATININE: 0.68 mg/dL (ref 0.44–1.00)
Glucose, Bld: 104 mg/dL — ABNORMAL HIGH (ref 65–99)
Potassium: 3.4 mmol/L — ABNORMAL LOW (ref 3.5–5.1)
Sodium: 140 mmol/L (ref 135–145)
TOTAL PROTEIN: 6.3 g/dL — AB (ref 6.5–8.1)

## 2016-08-16 MED ORDER — HEPARIN SOD (PORK) LOCK FLUSH 100 UNIT/ML IV SOLN
500.0000 [IU] | Freq: Once | INTRAVENOUS | Status: AC
  Administered 2016-08-16: 500 [IU] via INTRAVENOUS
  Filled 2016-08-16: qty 5

## 2016-08-16 MED ORDER — SODIUM CHLORIDE 0.9% FLUSH
10.0000 mL | INTRAVENOUS | Status: AC | PRN
  Administered 2016-08-16: 10 mL via INTRAVENOUS
  Filled 2016-08-16: qty 10

## 2016-08-16 NOTE — Progress Notes (Unsigned)
Tiffany Sweeney presented for Portacath access and flush.  Portacath located right chest wall accessed with  H 20 needle.  Good blood return present. Portacath flushed with 20ml NS and 500U/5ml Heparin and needle removed intact.  Procedure tolerated well and without incident.  Discharged ambulatory.  

## 2016-08-17 LAB — CANCER ANTIGEN 15-3: CA 15-3: 18.6 U/mL (ref 0.0–25.0)

## 2016-08-17 LAB — CANCER ANTIGEN 27.29: CAN 27.29: 24.7 U/mL (ref 0.0–38.6)

## 2016-09-09 ENCOUNTER — Ambulatory Visit (HOSPITAL_COMMUNITY): Payer: Medicare Other

## 2016-09-26 ENCOUNTER — Ambulatory Visit (HOSPITAL_COMMUNITY): Payer: Medicare Other

## 2016-10-10 ENCOUNTER — Encounter (HOSPITAL_COMMUNITY): Payer: Medicare Other

## 2016-10-10 ENCOUNTER — Ambulatory Visit (HOSPITAL_COMMUNITY): Payer: Medicare Other | Admitting: Oncology

## 2016-10-14 ENCOUNTER — Ambulatory Visit (HOSPITAL_COMMUNITY): Payer: Medicare Other | Admitting: Oncology

## 2016-10-14 ENCOUNTER — Encounter (HOSPITAL_COMMUNITY): Payer: Medicare Other

## 2016-10-20 ENCOUNTER — Encounter (HOSPITAL_BASED_OUTPATIENT_CLINIC_OR_DEPARTMENT_OTHER): Payer: Medicare Other

## 2016-10-20 ENCOUNTER — Encounter (HOSPITAL_COMMUNITY): Payer: Self-pay | Admitting: Oncology

## 2016-10-20 ENCOUNTER — Encounter (HOSPITAL_COMMUNITY): Payer: Medicare Other | Attending: Adult Health | Admitting: Oncology

## 2016-10-20 VITALS — BP 176/82 | HR 55 | Resp 18 | Ht 64.0 in | Wt 166.5 lb

## 2016-10-20 DIAGNOSIS — R600 Localized edema: Secondary | ICD-10-CM | POA: Diagnosis not present

## 2016-10-20 DIAGNOSIS — M8580 Other specified disorders of bone density and structure, unspecified site: Secondary | ICD-10-CM

## 2016-10-20 DIAGNOSIS — Z95828 Presence of other vascular implants and grafts: Secondary | ICD-10-CM

## 2016-10-20 DIAGNOSIS — C50519 Malignant neoplasm of lower-outer quadrant of unspecified female breast: Secondary | ICD-10-CM

## 2016-10-20 DIAGNOSIS — Z171 Estrogen receptor negative status [ER-]: Secondary | ICD-10-CM

## 2016-10-20 DIAGNOSIS — Z8041 Family history of malignant neoplasm of ovary: Secondary | ICD-10-CM

## 2016-10-20 DIAGNOSIS — Z853 Personal history of malignant neoplasm of breast: Secondary | ICD-10-CM

## 2016-10-20 DIAGNOSIS — C50412 Malignant neoplasm of upper-outer quadrant of left female breast: Secondary | ICD-10-CM | POA: Insufficient documentation

## 2016-10-20 MED ORDER — HEPARIN SOD (PORK) LOCK FLUSH 100 UNIT/ML IV SOLN
500.0000 [IU] | Freq: Once | INTRAVENOUS | Status: AC
  Administered 2016-10-20: 500 [IU] via INTRAVENOUS

## 2016-10-20 MED ORDER — HEPARIN SOD (PORK) LOCK FLUSH 100 UNIT/ML IV SOLN
INTRAVENOUS | Status: AC
  Filled 2016-10-20: qty 5

## 2016-10-20 MED ORDER — SODIUM CHLORIDE 0.9% FLUSH
10.0000 mL | INTRAVENOUS | Status: DC | PRN
  Administered 2016-10-20: 10 mL via INTRAVENOUS
  Filled 2016-10-20: qty 10

## 2016-10-20 NOTE — Progress Notes (Signed)
Tiffany Evens, MD [place] [id number]  Progress Note  Stage I triple negative carcinoma of the left breast s/p mastectomy and axillary LN disssection. History of Carcinoma of the R breast    Breast cancer ([place])   07/25/2013 Imaging    Plain film of the lumbar spine, complete 4 view. Chronic lumbar degenerative change most prominent at L4-5. No acute abnormality and no change from prior study      10/21/2013 Initial Diagnosis    Left breast cancer, core biopsy, ER negative, PR negative, HER-2/neu negative, Ki-67 of 13%, infiltrating duct cell      11/04/2013 Surgery    Left modified radical mastectomy, 1.8 cm infiltrating duct cell carcinoma, ER negative, PR negative, HER-2/neu not overexpressed, 9 lymph nodes negative. Stage TI cN0 M0.      12/06/2013 Imaging    Portable chest x-ray showing no pneumothorax no lung edema or consolidation Port-A-Cath tip in the SVC.      12/23/2013 - 02/03/2014 Chemotherapy    Cycle 1 of TC initiated on 12/23/2013, patient completed 3 cycles of prescirbed therapy        CURRENT THERAPY: Observation  INTERVAL HISTORY: Tiffany Sweeney 82 y.o. female returns for follow-up of her triple negative breast cancer of the L breast.   Tiffany Sweeney returns to the [place] today unaccompanied. She has not palpated any subcutaneous nodules on her chest wall or axillary lympadenopathy. She states overall she is doing well except for edema in her bilateral legs. She's had persistent leg edema since the 1960s, worse in her left leg compared to the right. She was given lasix PRN. Otherwise ROS was negative.    MEDICAL HISTORY: Past Medical History:  Diagnosis Date  . Anxiety   . Arthritis   . Breast cancer ([place])   . Cancer [facility])    breast- right  . Family history of colon cancer   . Family history of ovarian cancer   . Family history of pancreatic cancer   . High cholesterol   . Hypertension   . Skin cancer    Patient stated  she has low grade skin cancer in right hand    has Low back pain; Breast cancer ([place]); Family history of ovarian cancer; Family history of colon cancer; Family history of pancreatic cancer; Genetic testing; OA (osteoarthritis) of hip; Health care maintenance; and Blood glucose elevated on her problem list.     has No Known Allergies.  Ms. Meas had no medications administered during this visit.  SURGICAL HISTORY: Past Surgical History:  Procedure Laterality Date  . ABDOMINAL HYSTERECTOMY    . CATARACT EXTRACTION W/PHACO Left 03/11/2013   Procedure: CATARACT EXTRACTION PHACO AND INTRAOCULAR LENS PLACEMENT (IOC);  Surgeon: Tonny Branch, MD;  Location: [facility];  Service: Ophthalmology;  Laterality: Left;  CDE:7.54  . CATARACT EXTRACTION W/PHACO Right 04/15/2013   Procedure: CATARACT EXTRACTION PHACO AND INTRAOCULAR LENS PLACEMENT (IOC);  Surgeon: Tonny Branch, MD;  Location: [facility];  Service: Ophthalmology;  Laterality: Right;  CDE 9.38  . MASTECTOMY Right   . MASTECTOMY MODIFIED RADICAL Left 11/04/2013   Procedure: LEFT MASTECTOMY MODIFIED RADICAL;  Surgeon: Jamesetta So, MD;  Location: [facility];  Service: General;  Laterality: Left;  . PORTACATH PLACEMENT Right 12/06/2013   Procedure: INSERTION PORT-A-CATH-Right subclavian;  Surgeon: Jamesetta So, MD;  Location: [facility];  Service: General;  Laterality: Right;  . TOTAL HIP ARTHROPLASTY Right 08/05/2015   Procedure: RIGHT TOTAL HIP ARTHROPLASTY ANTERIOR  APPROACH;  Surgeon: Gaynelle Arabian, MD;  Location: [facility];  Service: Orthopedics;  Laterality: Right;    SOCIAL HISTORY: Social History   Social History  . Marital status: Widowed    Spouse name: N/A  . Number of children: 3  . Years of education: N/A   Occupational History  . Not on file.   Social History Main Topics  . Smoking status: Former Smoker    Packs/day: 0.25    Years: 30.00    Types: Cigarettes    Start date: 10/10/1956    Quit date: 03/08/1985  . Smokeless  tobacco: Never Used     Comment: quit 25 years ago  . Alcohol use No  . Drug use: No  . Sexual activity: Yes    Birth control/ protection: Post-menopausal   Other Topics Concern  . Not on file   Social History Narrative  . No narrative on file    FAMILY HISTORY: Family History  Problem Relation Age of Onset  . Diabetes Mother   . Rectal cancer Mother 47       died from complications of DM  . Dementia Father        Died at 68 in an accident  . Pancreatic cancer Brother 52  . Lung cancer Sister 8       "small oat cell"  . Ovarian cancer Sister 31  . Cancer Cousin        maternal 1st cousin with either ovarian or uterine  . Colon cancer Cousin        paternal first cousin dx at unknown age    Review of Systems  Constitutional: Negative.   HENT: Negative.   Eyes: Negative.   Respiratory: Negative.   Cardiovascular: Negative.   Gastrointestinal: Negative.   Genitourinary: Negative.   Musculoskeletal: Negative.   Skin: Negative.   Neurological: Negative.   Endo/Heme/Allergies: Negative.   Psychiatric/Behavioral: Negative.   All other systems reviewed and are negative. 14 point review of systems was performed and is negative except as detailed under history of present illness and above    PHYSICAL EXAMINATION  ECOG PERFORMANCE STATUS: 1 - Symptomatic but completely ambulatory  Vitals:   10/20/16 1426  BP: (!) 176/82  Pulse: (!) 55  Resp: 18  SpO2: 97%      Physical Exam  Constitutional: She is oriented to person, place, and time and well-developed, well-nourished, and in no distress. No distress.  Was able to get on exam table today without assistance.   HENT:  Head: Normocephalic and atraumatic.  Nose: Nose normal.  Mouth/Throat: Oropharynx is clear and moist. No oropharyngeal exudate.  Eyes: Pupils are equal, round, and reactive to light. Conjunctivae and EOM are normal. Right eye exhibits no discharge. Left eye exhibits no discharge. No scleral  icterus.  Neck: Normal range of motion. Neck supple. No JVD present. No tracheal deviation present. No thyromegaly present.  Cardiovascular: Normal rate, regular rhythm and normal heart sounds.  Exam reveals no gallop and no friction rub.   No murmur heard. Pulmonary/Chest: Effort normal and breath sounds normal. No respiratory distress. She has no wheezes. She has no rales.  Bilateral mastectomies. No subcutaneous nodules on her chest wall. No bilateral axillary lymphadenopathy.  Abdominal: Soft. Bowel sounds are normal. She exhibits no distension and no mass. There is no tenderness. There is no rebound and no guarding.  Musculoskeletal: Normal range of motion. She exhibits edema. She exhibits no tenderness.  Bilateral edema L > R  Lymphadenopathy:  She has no cervical adenopathy.  Neurological: She is alert and oriented to person, place, and time. She has normal reflexes. No cranial nerve deficit. Gait normal. Coordination normal.  Skin: Skin is warm and dry. No rash noted. No erythema. No pallor.  Psychiatric: Mood, memory, affect and judgment normal.  Nursing note and vitals reviewed.    LABORATORY DATA: I have reviewed the data below as listed. CBC    Component Value Date/Time   WBC 5.7 08/16/2016 1123   RBC 4.53 08/16/2016 1123   HGB 13.6 08/16/2016 1123   HGB 14.0 10/10/2013 1010   HGB 13.0 07/06/2006 1141   HCT 40.6 08/16/2016 1123   HCT 41.8 10/10/2013 1010   HCT 38.2 07/06/2006 1141   PLT 196 08/16/2016 1123   PLT 220 10/10/2013 1010   PLT 272 07/06/2006 1141   MCV 89.6 08/16/2016 1123   MCV 89 10/10/2013 1010   MCV 85.8 07/06/2006 1141   MCH 30.0 08/16/2016 1123   MCHC 33.5 08/16/2016 1123   RDW 13.5 08/16/2016 1123   RDW 13.3 10/10/2013 1010   RDW 11.2 (L) 07/06/2006 1141   LYMPHSABS 1.5 08/16/2016 1123   LYMPHSABS 2.0 10/10/2013 1010   LYMPHSABS 2.1 07/06/2006 1141   MONOABS 0.5 08/16/2016 1123   MONOABS 0.6 07/06/2006 1141   EOSABS 0.2 08/16/2016 1123    EOSABS 0.1 10/10/2013 1010   BASOSABS 0.0 08/16/2016 1123   BASOSABS 0.0 10/10/2013 1010   BASOSABS 0.1 07/06/2006 1141     Results for TAMMEY, DEEG (MRN [id number])   Ref. Range 11/02/2015 10:47 11/02/2015 11:30  Sodium Latest Ref Range: 135 - 145 mmol/L 141   Potassium Latest Ref Range: 3.5 - 5.1 mmol/L 3.5   Chloride Latest Ref Range: 101 - 111 mmol/L 106   CO2 Latest Ref Range: 22 - 32 mmol/L 29   Mean Plasma Glucose Latest Units: mg/dL  111  BUN Latest Ref Range: 6 - 20 mg/dL 18   Creatinine Latest Ref Range: 0.44 - 1.00 mg/dL 0.75   Calcium Latest Ref Range: 8.9 - 10.3 mg/dL 9.0   EGFR (Non-African Amer.) Latest Ref Range: >60 mL/min >60   EGFR (African American) Latest Ref Range: >60 mL/min >60   Glucose Latest Ref Range: 65 - 99 mg/dL 134 (H)   Anion gap Latest Ref Range: 5 - 15  6   Alkaline Phosphatase Latest Ref Range: 38 - 126 U/L 75   Albumin Latest Ref Range: 3.5 - 5.0 g/dL 3.6   AST Latest Ref Range: 15 - 41 U/L 18   ALT Latest Ref Range: 14 - 54 U/L 14   Total Protein Latest Ref Range: 6.5 - 8.1 g/dL 6.4 (L)   Total Bilirubin Latest Ref Range: 0.3 - 1.2 mg/dL 0.6   WBC Latest Ref Range: 4.0 - 10.5 K/uL 6.0   RBC Latest Ref Range: 3.87 - 5.11 MIL/uL 4.61   Hemoglobin Latest Ref Range: 12.0 - 15.0 g/dL 13.1   HCT Latest Ref Range: 36.0 - 46.0 % 40.6   MCV Latest Ref Range: 78.0 - 100.0 fL 88.1   MCH Latest Ref Range: 26.0 - 34.0 pg 28.4   MCHC Latest Ref Range: 30.0 - 36.0 g/dL 32.3   RDW Latest Ref Range: 11.5 - 15.5 % 13.6   Platelets Latest Ref Range: 150 - 400 K/uL 213   Neutrophils Latest Units: % 60   Lymphocytes Latest Units: % 27   Monocytes Relative Latest Units: % 8   Eosinophil Latest Units: % 4  Basophil Latest Units: % 1   NEUT# Latest Ref Range: 1.7 - 7.7 K/uL 3.7   Lymphocyte # Latest Ref Range: 0.7 - 4.0 K/uL 1.6   Monocyte # Latest Ref Range: 0.1 - 1.0 K/uL 0.5   Eosinophils Absolute Latest Ref Range: 0.0 - 0.7 K/uL 0.2   Basophils  Absolute Latest Ref Range: 0.0 - 0.1 K/uL 0.0      RADIOGRAPHIC STUDIES: I have personally reviewed the radiological images as listed and agreed with the findings in the report. CLINICAL DATA:  Postop right total hip replacement  EXAM: PORTABLE PELVIS 1-2 VIEWS  COMPARISON:  None.  FINDINGS: Changes of right hip replacement. Normal AP alignment. No hardware or bony complicating feature noted. Soft tissue drain in place. Moderate degenerative changes in the left hip.  IMPRESSION: Right hip replacement.  No visible complicating feature.   Electronically Signed   By: Rolm Baptise M.D.   On: 08/05/2015 10:39  ASSESSMENT and THERAPY PLAN:   Stage I triple negative carcinoma of the left breast Mastectomy 3 cycles of Taxotere and Cytoxan, discontinued prior to cycle 4 secondary to excessive fatigue, hand/foot syndrome and worsening neuropathy History of R breast cancer Family history of ovarian cancer Osteopenia Vulvar irritation Genetic Testing Negative 04/2014  She has a history of bilateral breast cancers. Currently without obvious recurrence on chest wall exam today.  She is to continue on calcium and vitamin D for her osteopenia. I have encouraged ongoing physical activity.   She will be getting her port flushed today and q2 months. I have discussed getting her port removed however patient states that she wishes to keep her port since she has poor peripheral vein access.  RTC in 6 months for follow up with CBC, CMP.   All questions were answered. The patient knows to call the clinic with any problems, questions or concerns.   This note was electronically signed. Twana First, MD

## 2016-10-20 NOTE — Progress Notes (Signed)
Tiffany Sweeney tolerated portacath flush well without complaints or incident. Port accessed with 20 gauge needle with blood return noted then flushed with 10 ml NS and 5 ml Heparin easily per protocol then de-accessed. Pt discharged self ambulatory in satisfactory condition

## 2016-10-20 NOTE — Patient Instructions (Signed)
[place] at [facility] Discharge Instructions  RECOMMENDATIONS MADE BY THE CONSULTANT AND ANY TEST RESULTS WILL BE SENT TO YOUR REFERRING PHYSICIAN.  Portacath flushed per protocol today. Follow-up as scheduled. Call clinic for any question or concerns  Thank you for choosing [place] at [facility] to provide your oncology and hematology care.  To afford each patient quality time with our provider, please arrive at least 15 minutes before your scheduled appointment time.    If you have a lab appointment with the [facility] please come in thru the  Main Entrance and check in at the main information desk  You need to re-schedule your appointment should you arrive 10 or more minutes late.  We strive to give you quality time with our providers, and arriving late affects you and other patients whose appointments are after yours.  Also, if you no show three or more times for appointments you may be dismissed from the clinic at the providers discretion.     Again, thank you for choosing [facility].  Our hope is that these requests will decrease the amount of time that you wait before being seen by our physicians.       _____________________________________________________________  Should you have questions after your visit to [facility], please contact our office at [phone] between the hours of 8:30 a.m. and 4:30 p.m.  Voicemails left after 4:30 p.m. will not be returned until the following business day.  For prescription refill requests, have your pharmacy contact our office.       Resources For Cancer Patients and their Caregivers ? American Cancer Society: Can assist with transportation, wigs, general needs, runs Look Good Feel Better.        819-699-3479 ? Cancer Care: Provides financial assistance, online support groups, medication/co-pay assistance.  1-800-813-HOPE 813-647-2688) ? [facility] Assists [place] cancer patients and their families through emotional , educational and financial support.  734-317-8395 ? [place] DSS Where to apply for food stamps, Medicaid and utility assistance. 302-141-4075 ? RCATS: Transportation to medical appointments. 912-586-8073 ? Social Security Administration: May apply for disability if have a Stage IV cancer. 907-353-4558 6313773509 ? [employer], Disability and Transit Services: Assists with nutrition, care and transit needs. [place] Support Programs: @10RELATIVEDAYS @ > Cancer Support Group  2nd Tuesday of the month 1pm-2pm, Journey Room  > Creative Journey  3rd Tuesday of the month 1130am-1pm, Journey Room  > Look Good Feel Better  1st Wednesday of the month 10am-12 noon, Journey Room (Call [facility] to register [phone])

## 2016-12-19 ENCOUNTER — Encounter (HOSPITAL_COMMUNITY): Payer: Self-pay

## 2016-12-19 ENCOUNTER — Encounter (HOSPITAL_COMMUNITY): Payer: Medicare Other | Attending: Adult Health

## 2016-12-19 ENCOUNTER — Other Ambulatory Visit: Payer: Self-pay

## 2016-12-19 DIAGNOSIS — Z853 Personal history of malignant neoplasm of breast: Secondary | ICD-10-CM

## 2016-12-19 DIAGNOSIS — Z452 Encounter for adjustment and management of vascular access device: Secondary | ICD-10-CM

## 2016-12-19 DIAGNOSIS — C50412 Malignant neoplasm of upper-outer quadrant of left female breast: Secondary | ICD-10-CM | POA: Insufficient documentation

## 2016-12-19 MED ORDER — HEPARIN SOD (PORK) LOCK FLUSH 100 UNIT/ML IV SOLN
500.0000 [IU] | Freq: Once | INTRAVENOUS | Status: AC
  Administered 2016-12-19: 500 [IU] via INTRAVENOUS
  Filled 2016-12-19: qty 5

## 2016-12-19 MED ORDER — SODIUM CHLORIDE 0.9% FLUSH
10.0000 mL | INTRAVENOUS | Status: DC | PRN
  Administered 2016-12-19: 10 mL via INTRAVENOUS
  Filled 2016-12-19: qty 10

## 2016-12-19 NOTE — Progress Notes (Signed)
Tiffany Sweeney presented for Portacath access and flush. Portacath located right chest wall accessed with  H 20 needle. Good blood return present. Portacath flushed with 54ml NS and 500U/20ml Heparin and needle removed intact. Procedure without incident. Patient tolerated procedure well.  Treatment given per orders. Patient tolerated it well without problems. Vitals stable and discharged home from clinic ambulatory. Follow up as scheduled.

## 2016-12-19 NOTE — Patient Instructions (Signed)
[facility] at [facility] Discharge Instructions  RECOMMENDATIONS MADE BY THE CONSULTANT AND ANY TEST RESULTS WILL BE SENT TO YOUR REFERRING PHYSICIAN.  Port flush done today. Follow up as scheduled.  Thank you for choosing [facility] at [facility] to provide your oncology and hematology care.  To afford each patient quality time with our provider, please arrive at least 15 minutes before your scheduled appointment time.    If you have a lab appointment with the Cancer Center please come in thru the  Main Entrance and check in at the main information desk  You need to re-schedule your appointment should you arrive 10 or more minutes late.  We strive to give you quality time with our providers, and arriving late affects you and other patients whose appointments are after yours.  Also, if you no show three or more times for appointments you may be dismissed from the clinic at the providers discretion.     Again, thank you for choosing [facility].  Our hope is that these requests will decrease the amount of time that you wait before being seen by our physicians.       _____________________________________________________________  Should you have questions after your visit to [facility], please contact our office at [phone] between the hours of 8:30 a.m. and 4:30 p.m.  Voicemails left after 4:30 p.m. will not be returned until the following business day.  For prescription refill requests, have your pharmacy contact our office.       Resources For Cancer Patients and their Caregivers ? American Cancer Society: Can assist with transportation, wigs, general needs, runs Look Good Feel Better.        1-888-227-6333 ? Cancer Care: Provides financial assistance, online support groups, medication/co-pay assistance.  1-800-813-HOPE (4673) ? [facility] Assists [place] cancer patients and  their families through emotional , educational and financial support.  336-427-4357 ? [place] DSS Where to apply for food stamps, Medicaid and utility assistance. 336-342-1394 ? RCATS: Transportation to medical appointments. 336-347-2287 ? Social Security Administration: May apply for disability if have a Stage IV cancer. 336-342-7796 1-800-772-1213 ? [place] Aging, Disability and Transit Services: Assists with nutrition, care and transit needs. 336-349-2343  Cancer Center Support Programs: @10RELATIVEDAYS@ > Cancer Support Group  2nd Tuesday of the month 1pm-2pm, Journey Room  > Creative Journey  3rd Tuesday of the month 1130am-1pm, Journey Room  > Look Good Feel Better  1st Wednesday of the month 10am-12 noon, Journey Room (Call American Cancer Society to register 1-800-395-5775)   

## 2016-12-21 ENCOUNTER — Encounter (HOSPITAL_COMMUNITY): Payer: Medicare Other

## 2017-01-19 ENCOUNTER — Inpatient Hospital Stay (HOSPITAL_COMMUNITY): Payer: Medicare Other | Attending: Internal Medicine

## 2017-01-19 ENCOUNTER — Encounter (HOSPITAL_COMMUNITY): Payer: Self-pay

## 2017-01-19 DIAGNOSIS — Z853 Personal history of malignant neoplasm of breast: Secondary | ICD-10-CM | POA: Diagnosis present

## 2017-01-19 DIAGNOSIS — Z452 Encounter for adjustment and management of vascular access device: Secondary | ICD-10-CM | POA: Diagnosis present

## 2017-01-19 MED ORDER — HEPARIN SOD (PORK) LOCK FLUSH 100 UNIT/ML IV SOLN
500.0000 [IU] | Freq: Once | INTRAVENOUS | Status: AC
  Administered 2017-01-19: 500 [IU] via INTRAVENOUS

## 2017-01-19 MED ORDER — HEPARIN SOD (PORK) LOCK FLUSH 100 UNIT/ML IV SOLN
INTRAVENOUS | Status: AC
  Filled 2017-01-19: qty 5

## 2017-01-19 MED ORDER — SODIUM CHLORIDE 0.9% FLUSH
10.0000 mL | INTRAVENOUS | Status: DC | PRN
Start: 2017-01-19 — End: 2017-01-19
  Administered 2017-01-19: 10 mL via INTRAVENOUS
  Filled 2017-01-19: qty 10

## 2017-01-19 NOTE — Progress Notes (Signed)
Nikky Demedeiros tolerated portacath flush well without complaints or incident. Port accessed with 20 gauge needle with blood return noted then flushed with 10 ml NS and 5 ml Heparin easily per protocol then de-accessed. VSS Pt discharged self ambulatory in satisfactory condition 

## 2017-01-19 NOTE — Patient Instructions (Signed)
[facility] at [facility] Discharge Instructions  RECOMMENDATIONS MADE BY THE CONSULTANT AND ANY TEST RESULTS WILL BE SENT TO YOUR REFERRING PHYSICIAN.  Portacath flushed per protocol today. Follow-up as scheduled. Call clinic for any questions or concerns  Thank you for choosing [facility] at [facility] to provide your oncology and hematology care.  To afford each patient quality time with our provider, please arrive at least 15 minutes before your scheduled appointment time.    If you have a lab appointment with the Cancer Center please come in thru the  Main Entrance and check in at the main information desk  You need to re-schedule your appointment should you arrive 10 or more minutes late.  We strive to give you quality time with our providers, and arriving late affects you and other patients whose appointments are after yours.  Also, if you no show three or more times for appointments you may be dismissed from the clinic at the providers discretion.     Again, thank you for choosing [facility].  Our hope is that these requests will decrease the amount of time that you wait before being seen by our physicians.       _____________________________________________________________  Should you have questions after your visit to [facility], please contact our office at [phone] between the hours of 8:30 a.m. and 4:30 p.m.  Voicemails left after 4:30 p.m. will not be returned until the following business day.  For prescription refill requests, have your pharmacy contact our office.       Resources For Cancer Patients and their Caregivers ? American Cancer Society: Can assist with transportation, wigs, general needs, runs Look Good Feel Better.        1-888-227-6333 ? Cancer Care: Provides financial assistance, online support groups, medication/co-pay assistance.  1-800-813-HOPE (4673) ? [facility] Assists [place] cancer patients and their families through emotional , educational and financial support.  336-427-4357 ? [place] DSS Where to apply for food stamps, Medicaid and utility assistance. 336-342-1394 ? RCATS: Transportation to medical appointments. 336-347-2287 ? Social Security Administration: May apply for disability if have a Stage IV cancer. 336-342-7796 1-800-772-1213 ? [place] Aging, Disability and Transit Services: Assists with nutrition, care and transit needs. 336-349-2343  Cancer Center Support Programs: @10RELATIVEDAYS@ > Cancer Support Group  2nd Tuesday of the month 1pm-2pm, Journey Room  > Creative Journey  3rd Tuesday of the month 1130am-1pm, Journey Room  > Look Good Feel Better  1st Wednesday of the month 10am-12 noon, Journey Room (Call American Cancer Society to register 1-800-395-5775)   

## 2017-02-20 ENCOUNTER — Encounter (HOSPITAL_COMMUNITY): Payer: Medicare Other

## 2017-04-20 ENCOUNTER — Inpatient Hospital Stay (HOSPITAL_COMMUNITY): Payer: Medicare Other | Attending: Hematology | Admitting: Hematology

## 2017-04-20 ENCOUNTER — Encounter (HOSPITAL_COMMUNITY): Payer: Self-pay | Admitting: Hematology

## 2017-04-20 ENCOUNTER — Other Ambulatory Visit: Payer: Self-pay

## 2017-04-20 ENCOUNTER — Inpatient Hospital Stay (HOSPITAL_COMMUNITY): Payer: Medicare Other

## 2017-04-20 VITALS — BP 157/76 | HR 63 | Temp 98.4°F | Resp 20 | Wt 168.6 lb

## 2017-04-20 DIAGNOSIS — Z95828 Presence of other vascular implants and grafts: Secondary | ICD-10-CM

## 2017-04-20 DIAGNOSIS — C50512 Malignant neoplasm of lower-outer quadrant of left female breast: Secondary | ICD-10-CM

## 2017-04-20 DIAGNOSIS — Z171 Estrogen receptor negative status [ER-]: Secondary | ICD-10-CM

## 2017-04-20 DIAGNOSIS — C50519 Malignant neoplasm of lower-outer quadrant of unspecified female breast: Secondary | ICD-10-CM

## 2017-04-20 DIAGNOSIS — Z9013 Acquired absence of bilateral breasts and nipples: Secondary | ICD-10-CM | POA: Diagnosis not present

## 2017-04-20 DIAGNOSIS — Z87891 Personal history of nicotine dependence: Secondary | ICD-10-CM | POA: Diagnosis not present

## 2017-04-20 DIAGNOSIS — R209 Unspecified disturbances of skin sensation: Secondary | ICD-10-CM | POA: Diagnosis not present

## 2017-04-20 DIAGNOSIS — Z853 Personal history of malignant neoplasm of breast: Secondary | ICD-10-CM | POA: Diagnosis present

## 2017-04-20 DIAGNOSIS — R600 Localized edema: Secondary | ICD-10-CM

## 2017-04-20 DIAGNOSIS — I890 Lymphedema, not elsewhere classified: Secondary | ICD-10-CM

## 2017-04-20 DIAGNOSIS — Z780 Asymptomatic menopausal state: Secondary | ICD-10-CM | POA: Diagnosis not present

## 2017-04-20 LAB — COMPREHENSIVE METABOLIC PANEL
ALK PHOS: 62 U/L (ref 38–126)
ALT: 16 U/L (ref 14–54)
AST: 19 U/L (ref 15–41)
Albumin: 3.6 g/dL (ref 3.5–5.0)
Anion gap: 10 (ref 5–15)
BILIRUBIN TOTAL: 0.8 mg/dL (ref 0.3–1.2)
BUN: 14 mg/dL (ref 6–20)
CALCIUM: 8.6 mg/dL — AB (ref 8.9–10.3)
CO2: 26 mmol/L (ref 22–32)
Chloride: 104 mmol/L (ref 101–111)
Creatinine, Ser: 0.66 mg/dL (ref 0.44–1.00)
GFR calc Af Amer: >60 mL/min (ref >60)
GFR calc non Af Amer: >60 mL/min (ref >60)
Glucose, Bld: 86 mg/dL (ref 65–99)
POTASSIUM: 3.5 mmol/L (ref 3.5–5.1)
Sodium: 140 mmol/L (ref 135–145)
TOTAL PROTEIN: 6.2 g/dL — AB (ref 6.5–8.1)

## 2017-04-20 LAB — CBC
HEMATOCRIT: 38.2 % (ref 36.0–46.0)
Hemoglobin: 12.5 g/dL (ref 12.0–15.0)
MCH: 29.1 pg (ref 26.0–34.0)
MCHC: 32.7 g/dL (ref 30.0–36.0)
MCV: 89 fL (ref 78.0–100.0)
Platelets: 207 10*3/uL (ref 150–400)
RBC: 4.29 MIL/uL (ref 3.87–5.11)
RDW: 13.5 % (ref 11.5–15.5)
WBC: 5.4 10*3/uL (ref 4.0–10.5)

## 2017-04-20 MED ORDER — HEPARIN SOD (PORK) LOCK FLUSH 100 UNIT/ML IV SOLN
500.0000 [IU] | Freq: Once | INTRAVENOUS | Status: AC
  Administered 2017-04-20: 500 [IU] via INTRAVENOUS

## 2017-04-20 MED ORDER — SODIUM CHLORIDE 0.9% FLUSH
10.0000 mL | Freq: Once | INTRAVENOUS | Status: AC
  Administered 2017-04-20: 10 mL via INTRAVENOUS

## 2017-04-20 NOTE — Assessment & Plan Note (Addendum)
1.  Stage I (T1CN0) triple negative left breast cancer: -Status post left mastectomy and axillary lymph node dissection on 11/04/2013, 1.8 cm IDC, 0 out of 9 lymph nodes positive -Status post 3 cycles of TC in the adjuvant setting, fourth cycle not given due to fatigue and neuropathy, completed on 02/03/2014 -Genetic testing negative in April 2016 -Today physical examination of bilateral mastectomy site was within normal limits.  We have sent her blood work and tumor markers.  She will come back in 6 months for follow-up.  Her sister recently was diagnosed with pancreatic cancer in [place].  Patient is planning to visit her in May.  Her brother reportedly died of pancreatic cancer 2 years ago.  2.  Right breast cancer: She has remote history of right breast cancer and underwent mastectomy.  3.  Bilateral lymphedema: She has lymphedema of both upper extremities, right more than left.  She also has bilateral lower extremity edema for many years.

## 2017-04-20 NOTE — Progress Notes (Signed)
[place], [place] [address]   CLINIC:  Medical Oncology/Hematology  PCP:  Lemmie Evens, MD [place] [address] [phone]   REASON FOR VISIT:  Follow-up for left breast cancer.  CURRENT THERAPY: None  BRIEF ONCOLOGIC HISTORY:    Breast cancer ([place])   07/25/2013 Imaging    Plain film of the lumbar spine, complete 4 view. Chronic lumbar degenerative change most prominent at L4-5. No acute abnormality and no change from prior study      10/21/2013 Initial Diagnosis    Left breast cancer, core biopsy, ER negative, PR negative, HER-2/neu negative, Ki-67 of 13%, infiltrating duct cell      11/04/2013 Surgery    Left modified radical mastectomy, 1.8 cm infiltrating duct cell carcinoma, ER negative, PR negative, HER-2/neu not overexpressed, 9 lymph nodes negative. Stage TI cN0 M0.      12/06/2013 Imaging    Portable chest x-ray showing no pneumothorax no lung edema or consolidation Port-A-Cath tip in the SVC.      12/23/2013 - 02/03/2014 Chemotherapy    Cycle 1 of TC initiated on 12/23/2013, patient completed 3 cycles of prescirbed therapy        CANCER STAGING: Cancer Staging Breast cancer ([place]) Staging form: Breast, AJCC 7th Edition - Clinical: Stage IA (T1a, N0, M0) - Unsigned    INTERVAL HISTORY:  Ms. Schreffler 83 y.o. female returns for follow-up of bilateral breast cancer.  She denies any new onset pains.  Denies any fevers, night sweats or significant weight loss in the last 6 months.  Denies any infections or hospitalizations.  Complains of on and off swelling of her upper extremities.  Usually right upper extremity swollen more than left.  Her bilateral lower extremity swelling has been stable.  REVIEW OF SYSTEMS:  Review of Systems  Cardiovascular: Positive for leg swelling.  Neurological: Positive for numbness.  All other systems reviewed and are negative.    PAST MEDICAL/SURGICAL HISTORY:    Past Medical History:  Diagnosis Date  . Anxiety   . Arthritis   . Breast cancer ([place])   . Cancer [facility])    breast- right  . Family history of colon cancer   . Family history of ovarian cancer   . Family history of pancreatic cancer   . High cholesterol   . Hypertension   . Skin cancer    Patient stated  she has low grade skin cancer in right hand   Past Surgical History:  Procedure Laterality Date  . ABDOMINAL HYSTERECTOMY    . CATARACT EXTRACTION W/PHACO Left 03/11/2013   Procedure: CATARACT EXTRACTION PHACO AND INTRAOCULAR LENS PLACEMENT (IOC);  Surgeon: Tonny Branch, MD;  Location: [facility];  Service: Ophthalmology;  Laterality: Left;  CDE:7.54  . CATARACT EXTRACTION W/PHACO Right 04/15/2013   Procedure: CATARACT EXTRACTION PHACO AND INTRAOCULAR LENS PLACEMENT (IOC);  Surgeon: Tonny Branch, MD;  Location: [facility];  Service: Ophthalmology;  Laterality: Right;  CDE 9.38  . MASTECTOMY Right   . MASTECTOMY MODIFIED RADICAL Left 11/04/2013   Procedure: LEFT MASTECTOMY MODIFIED RADICAL;  Surgeon: Jamesetta So, MD;  Location: [facility];  Service: General;  Laterality: Left;  . PORTACATH PLACEMENT Right 12/06/2013   Procedure: INSERTION PORT-A-CATH-Right subclavian;  Surgeon: Jamesetta So, MD;  Location: [facility];  Service: General;  Laterality: Right;  . TOTAL HIP ARTHROPLASTY Right 08/05/2015   Procedure: RIGHT TOTAL HIP ARTHROPLASTY ANTERIOR APPROACH;  Surgeon: Gaynelle Arabian, MD;  Location: [facility];  Service: Orthopedics;  Laterality: Right;     SOCIAL HISTORY:  Social History   Socioeconomic History  . Marital status: Widowed    Spouse name: Not on file  . Number of children: 3  . Years of education: Not on file  . Highest education level: Not on file  Occupational History  . Not on file  Social Needs  . Financial resource strain: Not on file  . Food insecurity:    Worry: Not on file    Inability: Not on file  . Transportation needs:    Medical: Not on file    Non-medical: Not on  file  Tobacco Use  . Smoking status: Former Smoker    Packs/day: 0.25    Years: 30.00    Pack years: 7.50    Types: Cigarettes    Start date: 10/10/1956    Last attempt to quit: 03/08/1985    Years since quitting: 32.1  . Smokeless tobacco: Never Used  . Tobacco comment: quit 25 years ago  Substance and Sexual Activity  . Alcohol use: No  . Drug use: No  . Sexual activity: Yes    Birth control/protection: Post-menopausal  Lifestyle  . Physical activity:    Days per week: Not on file    Minutes per session: Not on file  . Stress: Not on file  Relationships  . Social connections:    Talks on phone: Not on file    Gets together: Not on file    Attends religious service: Not on file    Active member of club or organization: Not on file    Attends meetings of clubs or organizations: Not on file    Relationship status: Not on file  . Intimate partner violence:    Fear of current or ex partner: Not on file    Emotionally abused: Not on file    Physically abused: Not on file    Forced sexual activity: Not on file  Other Topics Concern  . Not on file  Social History Narrative  . Not on file    FAMILY HISTORY:  Family History  Problem Relation Age of Onset  . Diabetes Mother   . Rectal cancer Mother 77       died from complications of DM  . Dementia Father        Died at 76 in an accident  . Pancreatic cancer Brother 21  . Lung cancer Sister 40       "small oat cell"  . Ovarian cancer Sister 47  . Cancer Cousin        maternal 1st cousin with either ovarian or uterine  . Colon cancer Cousin        paternal first cousin dx at unknown age    CURRENT MEDICATIONS:  Outpatient Encounter Medications as of 04/20/2017  Medication Sig  . atenolol (TENORMIN) 50 MG tablet Take 50 mg by mouth daily.  Marland Kitchen docusate sodium (COLACE) 100 MG capsule Take 100 mg by mouth daily as needed for mild constipation.   . furosemide (LASIX) 20 MG tablet Take 20 mg by mouth daily as needed.    Facility-Administered Encounter Medications as of 04/20/2017  Medication  . sodium chloride 0.9 % injection 10 mL  . sodium chloride flush (NS) 0.9 % injection 10 mL  . sodium chloride flush (NS) 0.9 % injection 10 mL    ALLERGIES:  No Known Allergies   PHYSICAL EXAM:  ECOG Performance status: 1  Vitals:   04/20/17 1447  BP: (!) 157/76  Pulse: 63  Resp: 20  Temp: 98.4 F (36.9 C)  SpO2: 97%   Filed Weights   04/20/17 1447  Weight: 168 lb 9.6 oz (76.5 kg)    Physical Exam  Constitutional: She is oriented to person, place, and time. She appears well-developed and well-nourished.  Cardiovascular: Normal rate and regular rhythm.  Pulmonary/Chest: Effort normal and breath sounds normal.  Bilateral mastectomy sites are within normal limits.  No palpable adenopathy in the axillary or supraclavicular region.  Neurological: She is alert and oriented to person, place, and time.  Skin: Skin is warm and dry.  Psychiatric: She has a normal mood and affect. Her behavior is normal. Judgment and thought content normal.     LABORATORY DATA:  I have reviewed the labs as listed.  CBC    Component Value Date/Time   WBC 5.7 08/16/2016 1123   RBC 4.53 08/16/2016 1123   HGB 13.6 08/16/2016 1123   HGB 14.0 10/10/2013 1010   HGB 13.0 07/06/2006 1141   HCT 40.6 08/16/2016 1123   HCT 41.8 10/10/2013 1010   HCT 38.2 07/06/2006 1141   PLT 196 08/16/2016 1123   PLT 220 10/10/2013 1010   PLT 272 07/06/2006 1141   MCV 89.6 08/16/2016 1123   MCV 89 10/10/2013 1010   MCV 85.8 07/06/2006 1141   MCH 30.0 08/16/2016 1123   MCHC 33.5 08/16/2016 1123   RDW 13.5 08/16/2016 1123   RDW 13.3 10/10/2013 1010   RDW 11.2 (L) 07/06/2006 1141   LYMPHSABS 1.5 08/16/2016 1123   LYMPHSABS 2.0 10/10/2013 1010   LYMPHSABS 2.1 07/06/2006 1141   MONOABS 0.5 08/16/2016 1123   MONOABS 0.6 07/06/2006 1141   EOSABS 0.2 08/16/2016 1123   EOSABS 0.1 10/10/2013 1010   BASOSABS 0.0 08/16/2016 1123    BASOSABS 0.0 10/10/2013 1010   BASOSABS 0.1 07/06/2006 1141   CMP Latest Ref Rng & Units 08/16/2016 03/01/2016 11/02/2015  Glucose 65 - 99 mg/dL 104(H) 94 134(H)  BUN 6 - 20 mg/dL 20 16 18   Creatinine 0.44 - 1.00 mg/dL 0.68 0.68 0.75  Sodium 135 - 145 mmol/L 140 139 141  Potassium 3.5 - 5.1 mmol/L 3.4(L) 3.8 3.5  Chloride 101 - 111 mmol/L 105 104 106  CO2 22 - 32 mmol/L 26 30 29   Calcium 8.9 - 10.3 mg/dL 8.8(L) 9.3 9.0  Total Protein 6.5 - 8.1 g/dL 6.3(L) 6.6 6.4(L)  Total Bilirubin 0.3 - 1.2 mg/dL 0.6 0.6 0.6  Alkaline Phos 38 - 126 U/L 58 66 75  AST 15 - 41 U/L 18 21 18   ALT 14 - 54 U/L 16 16 14        ASSESSMENT & PLAN:   Breast cancer 1.  Stage I (T1CN0) triple negative left breast cancer: -Status post left mastectomy and axillary lymph node dissection on 11/04/2013, 1.8 cm IDC, 0 out of 9 lymph nodes positive -Status post 3 cycles of TC in the adjuvant setting, fourth cycle not given due to fatigue and neuropathy, completed on 02/03/2014 -Genetic testing negative in April 2016 -Today physical examination of bilateral mastectomy site was within normal limits.  We have sent her blood work and tumor markers.  She will come back in 6 months for follow-up.  Her sister recently was diagnosed with pancreatic cancer in [place].  Patient is planning to visit her in May.  Her brother reportedly died of pancreatic cancer 2 years ago.  2.  Right breast cancer: She has remote history of right breast  cancer and underwent mastectomy.  3.  Bilateral lymphedema: She has lymphedema of both upper extremities, right more than left.  She also has bilateral lower extremity edema for many years.      Orders placed this encounter:  Orders Placed This Encounter  Procedures  . CBC  . Comprehensive metabolic panel  . Cancer antigen 15-3  . Comprehensive metabolic panel  . CBC  . Cancer antigen 15-3      Derek Jack, MD [place] [phone]

## 2017-04-20 NOTE — Patient Instructions (Signed)
[facility]  Discharge Instructions:  Your port was flushed today. _______________________________________________________________  Thank you for choosing [facility] at [facility] to provide your oncology and hematology care.  To afford each patient quality time with our providers, please arrive at least 15 minutes before your scheduled appointment.  You need to re-schedule your appointment if you arrive 10 or more minutes late.  We strive to give you quality time with our providers, and arriving late affects you and other patients whose appointments are after yours.  Also, if you no show three or more times for appointments you may be dismissed from the clinic.  Again, thank you for choosing [facility] at [facility]. Our hope is that these requests will allow you access to exceptional care and in a timely manner. _______________________________________________________________  If you have questions after your visit, please contact our office at [phone] between the hours of 8:30 a.m. and 5:00 p.m. Voicemails left after 4:30 p.m. will not be returned until the following business day. _______________________________________________________________  For prescription refill requests, have your pharmacy contact our office. _______________________________________________________________  Recommendations made by the consultant and any test results will be sent to your referring physician. _______________________________________________________________ 

## 2017-04-20 NOTE — Progress Notes (Signed)
Patients port flushed with no complaints of pain.  Site clean and dry with no bruising or swelling noted at site.  No complaints of pain with flush.  Band aid applied.  VSS with discharge and left ambulatory with no s/s of distress noted.

## 2017-04-21 LAB — CANCER ANTIGEN 15-3: CA 15-3: 18.1 U/mL (ref 0.0–25.0)

## 2017-07-20 ENCOUNTER — Encounter (HOSPITAL_COMMUNITY): Payer: Self-pay

## 2017-07-20 ENCOUNTER — Inpatient Hospital Stay (HOSPITAL_COMMUNITY): Payer: Medicare Other | Attending: Hematology

## 2017-07-20 ENCOUNTER — Other Ambulatory Visit: Payer: Self-pay

## 2017-07-20 VITALS — BP 153/91 | HR 59 | Temp 98.2°F | Resp 20

## 2017-07-20 DIAGNOSIS — Z853 Personal history of malignant neoplasm of breast: Secondary | ICD-10-CM | POA: Diagnosis present

## 2017-07-20 DIAGNOSIS — Z79899 Other long term (current) drug therapy: Secondary | ICD-10-CM

## 2017-07-20 DIAGNOSIS — Z95828 Presence of other vascular implants and grafts: Secondary | ICD-10-CM

## 2017-07-20 DIAGNOSIS — I10 Essential (primary) hypertension: Secondary | ICD-10-CM | POA: Insufficient documentation

## 2017-07-20 DIAGNOSIS — E785 Hyperlipidemia, unspecified: Secondary | ICD-10-CM

## 2017-07-20 LAB — TSH: TSH: 2.195 u[IU]/mL (ref 0.350–4.500)

## 2017-07-20 LAB — LIPID PANEL
CHOL/HDL RATIO: 4.2 ratio
Cholesterol: 229 mg/dL — ABNORMAL HIGH (ref 0–200)
HDL: 54 mg/dL (ref >40)
LDL CALC: 155 mg/dL — AB (ref 0–99)
Triglycerides: 101 mg/dL (ref <150)
VLDL: 20 mg/dL (ref 0–40)

## 2017-07-20 MED ORDER — SODIUM CHLORIDE 0.9% FLUSH
10.0000 mL | INTRAVENOUS | Status: DC | PRN
  Administered 2017-07-20: 10 mL via INTRAVENOUS
  Filled 2017-07-20: qty 10

## 2017-07-20 MED ORDER — HEPARIN SOD (PORK) LOCK FLUSH 100 UNIT/ML IV SOLN
500.0000 [IU] | Freq: Once | INTRAVENOUS | Status: AC
  Administered 2017-07-20: 500 [IU] via INTRAVENOUS

## 2017-07-20 MED ORDER — HEPARIN SOD (PORK) LOCK FLUSH 100 UNIT/ML IV SOLN
INTRAVENOUS | Status: AC
  Filled 2017-07-20: qty 5

## 2017-07-20 NOTE — Progress Notes (Signed)
Harriet Masson presented for Portacath access and flush. Portacath located right chest wall accessed with  H 20 needle. Good blood return present. Portacath flushed with 80ml NS and 500U/37ml Heparin and needle removed intact. Procedure without incident. Patient tolerated procedure well.  Labs drawn via port for Dr. Karie Kirks (TSH/lipid panel)

## 2017-10-12 ENCOUNTER — Inpatient Hospital Stay (HOSPITAL_COMMUNITY): Payer: Medicare Other | Attending: Hematology

## 2017-10-12 ENCOUNTER — Encounter (HOSPITAL_COMMUNITY): Payer: Self-pay

## 2017-10-12 ENCOUNTER — Other Ambulatory Visit: Payer: Self-pay

## 2017-10-12 VITALS — BP 151/79 | HR 73 | Temp 97.9°F | Resp 18

## 2017-10-12 DIAGNOSIS — K5900 Constipation, unspecified: Secondary | ICD-10-CM | POA: Diagnosis not present

## 2017-10-12 DIAGNOSIS — R600 Localized edema: Secondary | ICD-10-CM | POA: Insufficient documentation

## 2017-10-12 DIAGNOSIS — Z87891 Personal history of nicotine dependence: Secondary | ICD-10-CM | POA: Insufficient documentation

## 2017-10-12 DIAGNOSIS — Z171 Estrogen receptor negative status [ER-]: Secondary | ICD-10-CM

## 2017-10-12 DIAGNOSIS — Z9013 Acquired absence of bilateral breasts and nipples: Secondary | ICD-10-CM | POA: Insufficient documentation

## 2017-10-12 DIAGNOSIS — Z853 Personal history of malignant neoplasm of breast: Secondary | ICD-10-CM | POA: Diagnosis not present

## 2017-10-12 DIAGNOSIS — Z95828 Presence of other vascular implants and grafts: Secondary | ICD-10-CM

## 2017-10-12 DIAGNOSIS — Z800 Family history of malignant neoplasm of digestive organs: Secondary | ICD-10-CM | POA: Insufficient documentation

## 2017-10-12 DIAGNOSIS — Z8041 Family history of malignant neoplasm of ovary: Secondary | ICD-10-CM | POA: Insufficient documentation

## 2017-10-12 DIAGNOSIS — I890 Lymphedema, not elsewhere classified: Secondary | ICD-10-CM | POA: Diagnosis not present

## 2017-10-12 DIAGNOSIS — C50512 Malignant neoplasm of lower-outer quadrant of left female breast: Secondary | ICD-10-CM

## 2017-10-12 LAB — COMPREHENSIVE METABOLIC PANEL
ALBUMIN: 3.7 g/dL (ref 3.5–5.0)
ALT: 15 U/L (ref 0–44)
AST: 18 U/L (ref 15–41)
Alkaline Phosphatase: 66 U/L (ref 38–126)
Anion gap: 9 (ref 5–15)
BUN: 15 mg/dL (ref 8–23)
CHLORIDE: 106 mmol/L (ref 98–111)
CO2: 27 mmol/L (ref 22–32)
Calcium: 9.1 mg/dL (ref 8.9–10.3)
Creatinine, Ser: 0.76 mg/dL (ref 0.44–1.00)
GFR calc Af Amer: >60 mL/min (ref >60)
GFR calc non Af Amer: >60 mL/min (ref >60)
GLUCOSE: 148 mg/dL — AB (ref 70–99)
POTASSIUM: 3.5 mmol/L (ref 3.5–5.1)
Sodium: 142 mmol/L (ref 135–145)
Total Bilirubin: 0.7 mg/dL (ref 0.3–1.2)
Total Protein: 6.8 g/dL (ref 6.5–8.1)

## 2017-10-12 LAB — CBC
HEMATOCRIT: 42.3 % (ref 36.0–46.0)
Hemoglobin: 13.2 g/dL (ref 12.0–15.0)
MCH: 28.6 pg (ref 26.0–34.0)
MCHC: 31.2 g/dL (ref 30.0–36.0)
MCV: 91.6 fL (ref 80.0–100.0)
PLATELETS: 226 10*3/uL (ref 150–400)
RBC: 4.62 MIL/uL (ref 3.87–5.11)
RDW: 13.2 % (ref 11.5–15.5)
WBC: 5.5 10*3/uL (ref 4.0–10.5)
nRBC: 0 % (ref 0.0–0.2)

## 2017-10-12 MED ORDER — SODIUM CHLORIDE 0.9% FLUSH
10.0000 mL | INTRAVENOUS | Status: DC | PRN
  Administered 2017-10-12: 10 mL via INTRAVENOUS
  Filled 2017-10-12: qty 10

## 2017-10-12 MED ORDER — HEPARIN SOD (PORK) LOCK FLUSH 100 UNIT/ML IV SOLN
500.0000 [IU] | Freq: Once | INTRAVENOUS | Status: AC
  Administered 2017-10-12: 500 [IU] via INTRAVENOUS

## 2017-10-12 NOTE — Progress Notes (Signed)
Tiffany Sweeney presented for Portacath access and flush.  Portacath located right chest wall accessed with  H 20 needle.  Good blood return present. Portacath flushed with 58ml NS and 500U/68ml Heparin and needle removed intact.  Procedure tolerated well and without incident.  Discharged ambulatory.

## 2017-10-13 LAB — CANCER ANTIGEN 15-3: CA 15-3: 18 U/mL (ref 0.0–25.0)

## 2017-10-16 ENCOUNTER — Encounter (HOSPITAL_COMMUNITY): Payer: Self-pay | Admitting: Hematology

## 2017-10-16 ENCOUNTER — Inpatient Hospital Stay (HOSPITAL_BASED_OUTPATIENT_CLINIC_OR_DEPARTMENT_OTHER): Payer: Medicare Other | Admitting: Hematology

## 2017-10-16 VITALS — BP 173/98 | HR 68 | Temp 98.4°F | Resp 18 | Wt 168.3 lb

## 2017-10-16 DIAGNOSIS — Z9013 Acquired absence of bilateral breasts and nipples: Secondary | ICD-10-CM

## 2017-10-16 DIAGNOSIS — R600 Localized edema: Secondary | ICD-10-CM | POA: Diagnosis not present

## 2017-10-16 DIAGNOSIS — Z853 Personal history of malignant neoplasm of breast: Secondary | ICD-10-CM | POA: Diagnosis not present

## 2017-10-16 DIAGNOSIS — C50512 Malignant neoplasm of lower-outer quadrant of left female breast: Secondary | ICD-10-CM

## 2017-10-16 DIAGNOSIS — Z800 Family history of malignant neoplasm of digestive organs: Secondary | ICD-10-CM

## 2017-10-16 DIAGNOSIS — Z171 Estrogen receptor negative status [ER-]: Principal | ICD-10-CM

## 2017-10-16 DIAGNOSIS — I890 Lymphedema, not elsewhere classified: Secondary | ICD-10-CM

## 2017-10-16 DIAGNOSIS — K5900 Constipation, unspecified: Secondary | ICD-10-CM

## 2017-10-16 DIAGNOSIS — Z87891 Personal history of nicotine dependence: Secondary | ICD-10-CM

## 2017-10-16 DIAGNOSIS — Z8041 Family history of malignant neoplasm of ovary: Secondary | ICD-10-CM

## 2017-10-16 DIAGNOSIS — Z808 Family history of malignant neoplasm of other organs or systems: Secondary | ICD-10-CM

## 2017-10-16 NOTE — Progress Notes (Signed)
[place], [place] [address]   CLINIC:  Medical Oncology/Hematology  PCP:  Lemmie Evens, MD [place] [address] [phone]   REASON FOR VISIT: Follow-up for breast cancer, ER-/PR-/HER2-  CURRENT THERAPY: surveillance per NCCN guidelines   BRIEF ONCOLOGIC HISTORY:    Breast cancer ([place])   07/25/2013 Imaging    Plain film of the lumbar spine, complete 4 view. Chronic lumbar degenerative change most prominent at L4-5. No acute abnormality and no change from prior study    10/21/2013 Initial Diagnosis    Left breast cancer, core biopsy, ER negative, PR negative, HER-2/neu negative, Ki-67 of 13%, infiltrating duct cell    11/04/2013 Surgery    Left modified radical mastectomy, 1.8 cm infiltrating duct cell carcinoma, ER negative, PR negative, HER-2/neu not overexpressed, 9 lymph nodes negative. Stage TI cN0 M0.    12/06/2013 Imaging    Portable chest x-ray showing no pneumothorax no lung edema or consolidation Port-A-Cath tip in the SVC.    12/23/2013 - 02/03/2014 Chemotherapy    Cycle 1 of TC initiated on 12/23/2013, patient completed 3 cycles of prescirbed therapy      CANCER STAGING: Cancer Staging Breast cancer ([place]) Staging form: Breast, AJCC 7th Edition - Clinical: Stage IA (T1a, N0, M0) - Unsigned    INTERVAL HISTORY:  [name] 83 y.o. female returns for routine follow-up for left triple negative breast cancer. She report she is doing well. She has bilateral lower extremity leg swelling and constipation. She has no other complaints at this time. She reports her appetite at 100% and is maintaining her weight. Her energy level is 75%. She denies any new pains or lumps present. Denies any lymph edema.    REVIEW OF SYSTEMS:  Review of Systems  Cardiovascular: Positive for leg swelling.  Gastrointestinal: Positive for constipation.  Neurological: Positive for numbness (feet).  All other systems reviewed  and are negative.    PAST MEDICAL/SURGICAL HISTORY:  Past Medical History:  Diagnosis Date  . Anxiety   . Arthritis   . Breast cancer ([place])   . Cancer [facility])    breast- right  . Family history of colon cancer   . Family history of ovarian cancer   . Family history of pancreatic cancer   . High cholesterol   . Hypertension   . Skin cancer    Patient stated  she has low grade skin cancer in right hand   Past Surgical History:  Procedure Laterality Date  . ABDOMINAL HYSTERECTOMY    . CATARACT EXTRACTION W/PHACO Left 03/11/2013   Procedure: CATARACT EXTRACTION PHACO AND INTRAOCULAR LENS PLACEMENT (IOC);  Surgeon: Tonny Branch, MD;  Location: [facility];  Service: Ophthalmology;  Laterality: Left;  CDE:7.54  . CATARACT EXTRACTION W/PHACO Right 04/15/2013   Procedure: CATARACT EXTRACTION PHACO AND INTRAOCULAR LENS PLACEMENT (IOC);  Surgeon: Tonny Branch, MD;  Location: [facility];  Service: Ophthalmology;  Laterality: Right;  CDE 9.38  . MASTECTOMY Right   . MASTECTOMY MODIFIED RADICAL Left 11/04/2013   Procedure: LEFT MASTECTOMY MODIFIED RADICAL;  Surgeon: Jamesetta So, MD;  Location: [facility];  Service: General;  Laterality: Left;  . PORTACATH PLACEMENT Right 12/06/2013   Procedure: INSERTION PORT-A-CATH-Right subclavian;  Surgeon: Jamesetta So, MD;  Location: [facility];  Service: General;  Laterality: Right;  . TOTAL HIP ARTHROPLASTY Right 08/05/2015   Procedure: RIGHT TOTAL HIP ARTHROPLASTY ANTERIOR APPROACH;  Surgeon: Gaynelle Arabian, MD;  Location: [facility];  Service: Orthopedics;  Laterality: Right;     SOCIAL HISTORY:  Social History   Socioeconomic History  . Marital status: Widowed    Spouse name: Not on file  . Number of children: 3  . Years of education: Not on file  . Highest education level: Not on file  Occupational History  . Not on file  Social Needs  . Financial resource strain: Not on file  . Food insecurity:    Worry: Not on file    Inability: Not on file  . Transportation  needs:    Medical: Not on file    Non-medical: Not on file  Tobacco Use  . Smoking status: Former Smoker    Packs/day: 0.25    Years: 30.00    Pack years: 7.50    Types: Cigarettes    Start date: 10/10/1956    Last attempt to quit: 03/08/1985    Years since quitting: 32.6  . Smokeless tobacco: Never Used  . Tobacco comment: quit 25 years ago  Substance and Sexual Activity  . Alcohol use: No  . Drug use: No  . Sexual activity: Yes    Birth control/protection: Post-menopausal  Lifestyle  . Physical activity:    Days per week: Not on file    Minutes per session: Not on file  . Stress: Not on file  Relationships  . Social connections:    Talks on phone: Not on file    Gets together: Not on file    Attends religious service: Not on file    Active member of club or organization: Not on file    Attends meetings of clubs or organizations: Not on file    Relationship status: Not on file  . Intimate partner violence:    Fear of current or ex partner: Not on file    Emotionally abused: Not on file    Physically abused: Not on file    Forced sexual activity: Not on file  Other Topics Concern  . Not on file  Social History Narrative  . Not on file    FAMILY HISTORY:  Family History  Problem Relation Age of Onset  . Diabetes Mother   . Rectal cancer Mother 59       died from complications of DM  . Dementia Father        Died at 53 in an accident  . Pancreatic cancer Brother 16  . Lung cancer Sister 20       "small oat cell"  . Ovarian cancer Sister 29  . Cancer Cousin        maternal 1st cousin with either ovarian or uterine  . Colon cancer Cousin        paternal first cousin dx at unknown age    CURRENT MEDICATIONS:  Outpatient Encounter Medications as of 10/16/2017  Medication Sig  . atenolol (TENORMIN) 50 MG tablet Take 50 mg by mouth daily.  Marland Kitchen docusate sodium (COLACE) 100 MG capsule Take 100 mg by mouth daily as needed for mild constipation.   .  lidocaine-prilocaine (EMLA) cream   . LORazepam (ATIVAN) 0.5 MG tablet Take 0.5 mg by mouth every 8 (eight) hours as needed for anxiety.   . torsemide (DEMADEX) 10 MG tablet Take 10 mg by mouth daily.    Facility-Administered Encounter Medications as of 10/16/2017  Medication  . sodium chloride 0.9 % injection 10 mL  . sodium chloride flush (NS) 0.9 % injection 10 mL  . sodium chloride flush (NS) 0.9 % injection 10 mL  ALLERGIES:  No Known Allergies   PHYSICAL EXAM:  ECOG Performance status: 1  Vitals:   10/16/17 1048  BP: (!) 173/98  Pulse: 68  Resp: 18  Temp: 98.4 F (36.9 C)  SpO2: 97%   Filed Weights   10/16/17 1048  Weight: 168 lb 4.8 oz (76.3 kg)    Physical Exam  Constitutional: She is oriented to person, place, and time. She appears well-developed and well-nourished.  Abdominal: Soft.  Musculoskeletal: Normal range of motion.  Neurological: She is alert and oriented to person, place, and time.  Skin: Skin is warm and dry.  Psychiatric: She has a normal mood and affect. Her behavior is normal. Judgment and thought content normal.  Bilateral mastectomy sites are within normal limits.   LABORATORY DATA:  I have reviewed the labs as listed.  CBC    Component Value Date/Time   WBC 5.5 10/12/2017 1106   RBC 4.62 10/12/2017 1106   HGB 13.2 10/12/2017 1106   HGB 14.0 10/10/2013 1010   HGB 13.0 07/06/2006 1141   HCT 42.3 10/12/2017 1106   HCT 41.8 10/10/2013 1010   HCT 38.2 07/06/2006 1141   PLT 226 10/12/2017 1106   PLT 220 10/10/2013 1010   PLT 272 07/06/2006 1141   MCV 91.6 10/12/2017 1106   MCV 89 10/10/2013 1010   MCV 85.8 07/06/2006 1141   MCH 28.6 10/12/2017 1106   MCHC 31.2 10/12/2017 1106   RDW 13.2 10/12/2017 1106   RDW 13.3 10/10/2013 1010   RDW 11.2 (L) 07/06/2006 1141   LYMPHSABS 1.5 08/16/2016 1123   LYMPHSABS 2.0 10/10/2013 1010   LYMPHSABS 2.1 07/06/2006 1141   MONOABS 0.5 08/16/2016 1123   MONOABS 0.6 07/06/2006 1141   EOSABS  0.2 08/16/2016 1123   EOSABS 0.1 10/10/2013 1010   BASOSABS 0.0 08/16/2016 1123   BASOSABS 0.0 10/10/2013 1010   BASOSABS 0.1 07/06/2006 1141   CMP Latest Ref Rng & Units 10/12/2017 04/20/2017 08/16/2016  Glucose 70 - 99 mg/dL 148(H) 86 104(H)  BUN 8 - 23 mg/dL 15 14 20   Creatinine 0.44 - 1.00 mg/dL 0.76 0.66 0.68  Sodium 135 - 145 mmol/L 142 140 140  Potassium 3.5 - 5.1 mmol/L 3.5 3.5 3.4(L)  Chloride 98 - 111 mmol/L 106 104 105  CO2 22 - 32 mmol/L 27 26 26   Calcium 8.9 - 10.3 mg/dL 9.1 8.6(L) 8.8(L)  Total Protein 6.5 - 8.1 g/dL 6.8 6.2(L) 6.3(L)  Total Bilirubin 0.3 - 1.2 mg/dL 0.7 0.8 0.6  Alkaline Phos 38 - 126 U/L 66 62 58  AST 15 - 41 U/L 18 19 18   ALT 0 - 44 U/L 15 16 16          ASSESSMENT & PLAN:   Breast cancer 1.  Stage I (T1CN0) triple negative left breast cancer: -Status post left mastectomy and axillary lymph node dissection on 11/04/2013, 1.8 cm IDC, 0 out of 9 lymph nodes positive -Status post 3 cycles of TC in the adjuvant setting, fourth cycle not given due to fatigue and neuropathy, completed on 02/03/2014 -Genetic testing negative in April 2016 - Today's physical examination shows normal mastectomy sites with no palpable suspicious nodules. -She will be seen back in 6 months for follow-up with repeat physical exam and blood work.  Her sister was recently diagnosed with pancreatic cancer.  2.  Right breast cancer: She has remote history of right breast cancer and underwent mastectomy.  3.  Bilateral lymphedema: She has lymphedema of both upper extremities, right more  than left.  She also has bilateral lower extremity edema for many years.      Orders placed this encounter:  Orders Placed This Encounter  Procedures  . CBC with Differential/Platelet  . Comprehensive metabolic panel  . TSH  . Cancer antigen 15-3      Derek Jack, MD [place] [phone]

## 2017-10-16 NOTE — Assessment & Plan Note (Signed)
1.  Stage I (T1CN0) triple negative left breast cancer: -Status post left mastectomy and axillary lymph node dissection on 11/04/2013, 1.8 cm IDC, 0 out of 9 lymph nodes positive -Status post 3 cycles of TC in the adjuvant setting, fourth cycle not given due to fatigue and neuropathy, completed on 02/03/2014 -Genetic testing negative in April 2016 - Today's physical examination shows normal mastectomy sites with no palpable suspicious nodules. -She will be seen back in 6 months for follow-up with repeat physical exam and blood work.  Her sister was recently diagnosed with pancreatic cancer.  2.  Right breast cancer: She has remote history of right breast cancer and underwent mastectomy.  3.  Bilateral lymphedema: She has lymphedema of both upper extremities, right more than left.  She also has bilateral lower extremity edema for many years.

## 2017-10-16 NOTE — Patient Instructions (Addendum)
[facility] at [facility] Discharge Instructions  Follow up in 6 months with labs    Thank you for choosing [facility] at [facility] to provide your oncology and hematology care.  To afford each patient quality time with our provider, please arrive at least 15 minutes before your scheduled appointment time.   If you have a lab appointment with the Cancer Center please come in thru the  Main Entrance and check in at the main information desk  You need to re-schedule your appointment should you arrive 10 or more minutes late.  We strive to give you quality time with our providers, and arriving late affects you and other patients whose appointments are after yours.  Also, if you no show three or more times for appointments you may be dismissed from the clinic at the providers discretion.     Again, thank you for choosing [facility].  Our hope is that these requests will decrease the amount of time that you wait before being seen by our physicians.       _____________________________________________________________  Should you have questions after your visit to [facility], please contact our office at [phone] between the hours of 8:00 a.m. and 4:30 p.m.  Voicemails left after 4:00 p.m. will not be returned until the following business day.  For prescription refill requests, have your pharmacy contact our office and allow 72 hours.    Cancer Center Support Programs:   > Cancer Support Group  2nd Tuesday of the month 1pm-2pm, Journey Room    

## 2017-10-19 ENCOUNTER — Ambulatory Visit (HOSPITAL_COMMUNITY): Payer: Medicare Other | Admitting: Hematology

## 2017-11-06 ENCOUNTER — Other Ambulatory Visit (HOSPITAL_COMMUNITY): Payer: Self-pay | Admitting: Family Medicine

## 2017-11-06 DIAGNOSIS — E7800 Pure hypercholesterolemia, unspecified: Secondary | ICD-10-CM

## 2017-11-10 ENCOUNTER — Ambulatory Visit (HOSPITAL_COMMUNITY)
Admission: RE | Admit: 2017-11-10 | Discharge: 2017-11-10 | Disposition: A | Discharge status: Home / Self Care | Payer: Medicare Other | Source: Ambulatory Visit | Attending: Family Medicine | Admitting: Family Medicine

## 2017-11-10 DIAGNOSIS — E7800 Pure hypercholesterolemia, unspecified: Secondary | ICD-10-CM

## 2017-11-10 DIAGNOSIS — I6523 Occlusion and stenosis of bilateral carotid arteries: Secondary | ICD-10-CM | POA: Diagnosis not present

## 2018-04-10 ENCOUNTER — Inpatient Hospital Stay (HOSPITAL_COMMUNITY): Payer: Medicare Other

## 2018-04-10 ENCOUNTER — Other Ambulatory Visit: Payer: Self-pay

## 2018-04-11 ENCOUNTER — Other Ambulatory Visit (HOSPITAL_COMMUNITY)
Admission: RE | Admit: 2018-04-11 | Discharge: 2018-04-11 | Disposition: A | Discharge status: Home / Self Care | Payer: Medicare Other | Source: Ambulatory Visit | Attending: Family Medicine | Admitting: Family Medicine

## 2018-04-11 ENCOUNTER — Inpatient Hospital Stay (HOSPITAL_COMMUNITY): Payer: Medicare Other | Attending: Nurse Practitioner

## 2018-04-11 ENCOUNTER — Other Ambulatory Visit: Payer: Self-pay

## 2018-04-11 DIAGNOSIS — I10 Essential (primary) hypertension: Secondary | ICD-10-CM | POA: Insufficient documentation

## 2018-04-11 DIAGNOSIS — R600 Localized edema: Secondary | ICD-10-CM | POA: Insufficient documentation

## 2018-04-11 DIAGNOSIS — E7800 Pure hypercholesterolemia, unspecified: Secondary | ICD-10-CM | POA: Diagnosis not present

## 2018-04-11 LAB — LIPID PANEL
Cholesterol: 232 mg/dL — ABNORMAL HIGH (ref 0–200)
HDL: 49 mg/dL (ref >40)
LDL Cholesterol: 161 mg/dL — ABNORMAL HIGH (ref 0–99)
Total CHOL/HDL Ratio: 4.7 RATIO
Triglycerides: 110 mg/dL (ref <150)
VLDL: 22 mg/dL (ref 0–40)

## 2018-04-11 LAB — COMPREHENSIVE METABOLIC PANEL
ALT: 13 U/L (ref 0–44)
AST: 17 U/L (ref 15–41)
Albumin: 3.7 g/dL (ref 3.5–5.0)
Alkaline Phosphatase: 64 U/L (ref 38–126)
Anion gap: 9 (ref 5–15)
BUN: 18 mg/dL (ref 8–23)
CO2: 27 mmol/L (ref 22–32)
Calcium: 9 mg/dL (ref 8.9–10.3)
Chloride: 106 mmol/L (ref 98–111)
Creatinine, Ser: 0.8 mg/dL (ref 0.44–1.00)
GFR calc Af Amer: >60 mL/min (ref >60)
GFR calc non Af Amer: >60 mL/min (ref >60)
Glucose, Bld: 113 mg/dL — ABNORMAL HIGH (ref 70–99)
Potassium: 3.7 mmol/L (ref 3.5–5.1)
Sodium: 142 mmol/L (ref 135–145)
Total Bilirubin: 0.8 mg/dL (ref 0.3–1.2)
Total Protein: 6.5 g/dL (ref 6.5–8.1)

## 2018-04-11 LAB — CBC WITH DIFFERENTIAL/PLATELET
Abs Immature Granulocytes: 0.01 10*3/uL (ref 0.00–0.07)
Basophils Absolute: 0.1 10*3/uL (ref 0.0–0.1)
Basophils Relative: 1 %
Eosinophils Absolute: 0.1 10*3/uL (ref 0.0–0.5)
Eosinophils Relative: 3 %
HCT: 42.7 % (ref 36.0–46.0)
Hemoglobin: 13.5 g/dL (ref 12.0–15.0)
Immature Granulocytes: 0 %
Lymphocytes Relative: 29 %
Lymphs Abs: 1.3 10*3/uL (ref 0.7–4.0)
MCH: 28.8 pg (ref 26.0–34.0)
MCHC: 31.6 g/dL (ref 30.0–36.0)
MCV: 91.2 fL (ref 80.0–100.0)
Monocytes Absolute: 0.5 10*3/uL (ref 0.1–1.0)
Monocytes Relative: 10 %
Neutro Abs: 2.6 10*3/uL (ref 1.7–7.7)
Neutrophils Relative %: 57 %
Platelets: 181 10*3/uL (ref 150–400)
RBC: 4.68 MIL/uL (ref 3.87–5.11)
RDW: 13 % (ref 11.5–15.5)
WBC: 4.5 10*3/uL (ref 4.0–10.5)
nRBC: 0 % (ref 0.0–0.2)

## 2018-04-11 LAB — TSH: TSH: 2.052 u[IU]/mL (ref 0.350–4.500)

## 2018-04-17 ENCOUNTER — Encounter (HOSPITAL_COMMUNITY): Payer: Self-pay | Admitting: Hematology

## 2018-04-17 ENCOUNTER — Other Ambulatory Visit: Payer: Self-pay

## 2018-04-17 ENCOUNTER — Inpatient Hospital Stay (HOSPITAL_BASED_OUTPATIENT_CLINIC_OR_DEPARTMENT_OTHER): Payer: Medicare Other | Admitting: Hematology

## 2018-04-17 DIAGNOSIS — C50512 Malignant neoplasm of lower-outer quadrant of left female breast: Secondary | ICD-10-CM | POA: Diagnosis not present

## 2018-04-17 DIAGNOSIS — Z171 Estrogen receptor negative status [ER-]: Secondary | ICD-10-CM

## 2018-04-17 NOTE — Progress Notes (Signed)
Virtual Visit via Telephone Note  I connected with Tiffany Sweeney on 04/17/18 at  8:00 AM EDT by telephone and verified that I am speaking with the correct person using two identifiers.   I discussed the limitations, risks, security and privacy concerns of performing an evaluation and management service by telephone and the availability of in person appointments. I also discussed with the patient that there may be a patient responsible charge related to this service. The patient expressed understanding and agreed to proceed.   History of Present Illness: #1 stage I triple negative left breast cancer, status post mastectomy in 2015. - Status post 3 cycles of TC in the adjuvant setting completed on 02/03/2014. -Genetic testing was negative in April 2016.   Observations/Objective: -She denies any new onset pains.  No lumps palpable on the chest wall.  Denies any fevers, night sweats or weight loss in the last 6 months.  She does not report having any problems with the port.   Assessment and Plan: -I have reviewed blood work with her.  They are within normal limits. -She was advised to follow-up with [place] in 4 weeks for port flushes were experiencing shortage of needles at this time. -She will come back in 6 months for follow-up with repeat labs and physical exam.   Follow Up Instructions: Follow-up in 6 months.   I discussed the assessment and treatment plan with the patient. The patient was provided an opportunity to ask questions and all were answered. The patient agreed with the plan and demonstrated an understanding of the instructions.   The patient was advised to call back or seek an in-person evaluation if the symptoms worsen or if the condition fails to improve as anticipated.  I provided 10 minutes of non-face-to-face time during this encounter.   Derek Jack, MD

## 2018-05-25 ENCOUNTER — Inpatient Hospital Stay (HOSPITAL_COMMUNITY): Payer: Medicare Other | Attending: Nurse Practitioner

## 2018-05-25 ENCOUNTER — Other Ambulatory Visit: Payer: Self-pay

## 2018-05-25 ENCOUNTER — Encounter (HOSPITAL_COMMUNITY): Payer: Self-pay

## 2018-05-25 DIAGNOSIS — Z452 Encounter for adjustment and management of vascular access device: Secondary | ICD-10-CM | POA: Diagnosis not present

## 2018-05-25 DIAGNOSIS — Z853 Personal history of malignant neoplasm of breast: Secondary | ICD-10-CM | POA: Diagnosis present

## 2018-05-25 MED ORDER — SODIUM CHLORIDE 0.9% FLUSH
10.0000 mL | INTRAVENOUS | Status: DC | PRN
  Administered 2018-05-25: 10 mL via INTRAVENOUS
  Filled 2018-05-25: qty 10

## 2018-05-25 MED ORDER — HEPARIN SOD (PORK) LOCK FLUSH 100 UNIT/ML IV SOLN
500.0000 [IU] | Freq: Once | INTRAVENOUS | Status: AC
  Administered 2018-05-25: 500 [IU] via INTRAVENOUS

## 2018-05-25 NOTE — Progress Notes (Signed)
Tiffany Sweeney tolerated portacath flush well without complaints or incident. Port accessed with 20 gauge needle with blood return noted then flushed with 10 ml NS and 5 ml Heparin easily per protocol then de-accessed. VSS Pt discharged self ambulatory in satisfactory condition

## 2018-05-25 NOTE — Patient Instructions (Signed)
[facility] at [facility] Discharge Instructions  Portacath flushed per protocol today. Follow-up as scheduled. Call clinic for any questions or concerns   Thank you for choosing [facility] at [facility] to provide your oncology and hematology care.  To afford each patient quality time with our provider, please arrive at least 15 minutes before your scheduled appointment time.   If you have a lab appointment with the Cancer Center please come in thru the  Main Entrance and check in at the main information desk  You need to re-schedule your appointment should you arrive 10 or more minutes late.  We strive to give you quality time with our providers, and arriving late affects you and other patients whose appointments are after yours.  Also, if you no show three or more times for appointments you may be dismissed from the clinic at the providers discretion.     Again, thank you for choosing [facility].  Our hope is that these requests will decrease the amount of time that you wait before being seen by our physicians.       _____________________________________________________________  Should you have questions after your visit to [facility], please contact our office at [phone] between the hours of 8:00 a.m. and 4:30 p.m.  Voicemails left after 4:00 p.m. will not be returned until the following business day.  For prescription refill requests, have your pharmacy contact our office and allow 72 hours.    Cancer Center Support Programs:   > Cancer Support Group  2nd Tuesday of the month 1pm-2pm, Journey Room   

## 2018-08-24 ENCOUNTER — Encounter (HOSPITAL_COMMUNITY): Payer: Self-pay

## 2018-08-24 ENCOUNTER — Inpatient Hospital Stay (HOSPITAL_COMMUNITY): Payer: Medicare Other | Attending: Nurse Practitioner

## 2018-08-24 DIAGNOSIS — Z452 Encounter for adjustment and management of vascular access device: Secondary | ICD-10-CM | POA: Diagnosis present

## 2018-08-24 DIAGNOSIS — Z171 Estrogen receptor negative status [ER-]: Secondary | ICD-10-CM | POA: Diagnosis not present

## 2018-08-24 DIAGNOSIS — C50512 Malignant neoplasm of lower-outer quadrant of left female breast: Secondary | ICD-10-CM | POA: Insufficient documentation

## 2018-08-24 MED ORDER — HEPARIN SOD (PORK) LOCK FLUSH 100 UNIT/ML IV SOLN
500.0000 [IU] | Freq: Once | INTRAVENOUS | Status: AC
  Administered 2018-08-24: 500 [IU] via INTRAVENOUS

## 2018-08-24 MED ORDER — SODIUM CHLORIDE 0.9% FLUSH
10.0000 mL | Freq: Once | INTRAVENOUS | Status: AC
  Administered 2018-08-24: 10 mL via INTRAVENOUS

## 2018-08-24 NOTE — Progress Notes (Signed)
Tiffany Sweeney presented for Portacath access and flush. Portacath located right chest wall accessed with  H 20 needle. Good blood return present. Portacath flushed with 69ml NS and 500U/77ml Heparin and needle removed intact. Procedure without incident. Patient tolerated procedure well.  Vitals stable and discharged home from clinic ambulatory. Follow up as scheduled.

## 2018-09-23 ENCOUNTER — Emergency Department (HOSPITAL_COMMUNITY)
Admission: EM | Admit: 2018-09-23 | Discharge: 2018-09-23 | Disposition: A | Discharge status: Home / Self Care | Payer: Medicare Other | Attending: Emergency Medicine | Admitting: Emergency Medicine

## 2018-09-23 ENCOUNTER — Encounter (HOSPITAL_COMMUNITY): Payer: Self-pay | Admitting: Emergency Medicine

## 2018-09-23 ENCOUNTER — Other Ambulatory Visit: Payer: Self-pay

## 2018-09-23 ENCOUNTER — Emergency Department (HOSPITAL_COMMUNITY): Payer: Medicare Other

## 2018-09-23 DIAGNOSIS — Z79899 Other long term (current) drug therapy: Secondary | ICD-10-CM | POA: Insufficient documentation

## 2018-09-23 DIAGNOSIS — I10 Essential (primary) hypertension: Secondary | ICD-10-CM

## 2018-09-23 DIAGNOSIS — Z87891 Personal history of nicotine dependence: Secondary | ICD-10-CM | POA: Diagnosis not present

## 2018-09-23 DIAGNOSIS — Z853 Personal history of malignant neoplasm of breast: Secondary | ICD-10-CM | POA: Diagnosis not present

## 2018-09-23 DIAGNOSIS — R4182 Altered mental status, unspecified: Secondary | ICD-10-CM | POA: Diagnosis not present

## 2018-09-23 DIAGNOSIS — Z96641 Presence of right artificial hip joint: Secondary | ICD-10-CM | POA: Insufficient documentation

## 2018-09-23 DIAGNOSIS — Z85828 Personal history of other malignant neoplasm of skin: Secondary | ICD-10-CM | POA: Diagnosis not present

## 2018-09-23 DIAGNOSIS — Z9013 Acquired absence of bilateral breasts and nipples: Secondary | ICD-10-CM | POA: Insufficient documentation

## 2018-09-23 LAB — CBC WITH DIFFERENTIAL/PLATELET
Abs Immature Granulocytes: 0.01 10*3/uL (ref 0.00–0.07)
Basophils Absolute: 0.1 10*3/uL (ref 0.0–0.1)
Basophils Relative: 1 %
Eosinophils Absolute: 0.1 10*3/uL (ref 0.0–0.5)
Eosinophils Relative: 2 %
HCT: 42.9 % (ref 36.0–46.0)
Hemoglobin: 13.6 g/dL (ref 12.0–15.0)
Immature Granulocytes: 0 %
Lymphocytes Relative: 32 %
Lymphs Abs: 1.8 10*3/uL (ref 0.7–4.0)
MCH: 29.7 pg (ref 26.0–34.0)
MCHC: 31.7 g/dL (ref 30.0–36.0)
MCV: 93.7 fL (ref 80.0–100.0)
Monocytes Absolute: 0.6 10*3/uL (ref 0.1–1.0)
Monocytes Relative: 10 %
Neutro Abs: 3.1 10*3/uL (ref 1.7–7.7)
Neutrophils Relative %: 55 %
Platelets: 200 10*3/uL (ref 150–400)
RBC: 4.58 MIL/uL (ref 3.87–5.11)
RDW: 13.4 % (ref 11.5–15.5)
WBC: 5.7 10*3/uL (ref 4.0–10.5)
nRBC: 0 % (ref 0.0–0.2)

## 2018-09-23 LAB — COMPREHENSIVE METABOLIC PANEL
ALT: 14 U/L (ref 0–44)
AST: 18 U/L (ref 15–41)
Albumin: 3.7 g/dL (ref 3.5–5.0)
Alkaline Phosphatase: 64 U/L (ref 38–126)
Anion gap: 10 (ref 5–15)
BUN: 15 mg/dL (ref 8–23)
CO2: 25 mmol/L (ref 22–32)
Calcium: 8.9 mg/dL (ref 8.9–10.3)
Chloride: 106 mmol/L (ref 98–111)
Creatinine, Ser: 0.75 mg/dL (ref 0.44–1.00)
GFR calc Af Amer: >60 mL/min (ref >60)
GFR calc non Af Amer: >60 mL/min (ref >60)
Glucose, Bld: 107 mg/dL — ABNORMAL HIGH (ref 70–99)
Potassium: 3.4 mmol/L — ABNORMAL LOW (ref 3.5–5.1)
Sodium: 141 mmol/L (ref 135–145)
Total Bilirubin: 0.8 mg/dL (ref 0.3–1.2)
Total Protein: 6.3 g/dL — ABNORMAL LOW (ref 6.5–8.1)

## 2018-09-23 MED ORDER — HYDRALAZINE HCL 20 MG/ML IJ SOLN
5.0000 mg | Freq: Once | INTRAMUSCULAR | Status: AC
  Administered 2018-09-23: 5 mg via INTRAVENOUS
  Filled 2018-09-23: qty 1

## 2018-09-23 MED ORDER — HEPARIN SOD (PORK) LOCK FLUSH 100 UNIT/ML IV SOLN
500.0000 [IU] | Freq: Once | INTRAVENOUS | Status: AC
  Administered 2018-09-23: 500 [IU] via INTRAVENOUS
  Filled 2018-09-23: qty 5

## 2018-09-23 NOTE — ED Notes (Signed)
Pt to CT

## 2018-09-23 NOTE — ED Triage Notes (Signed)
Patient c/o hypertension. Per patient hx of hypertension in which PCP has been changing blood pressure medications in order to try and control. Patient states yesterday systolic has been [id number]. Patient states today her left arm feels "lighter." Patient unable to tell when her arm started feeling lighter. Denies headache, dizziness, slurred speech, facial drooping, or any other neurological deficit. Dr Roderic Palau made aware and in room to assess patient.

## 2018-09-23 NOTE — Discharge Instructions (Addendum)
Follow-up with your doctor this week for recheck. 

## 2018-09-23 NOTE — ED Provider Notes (Signed)
[facility] EMERGENCY DEPARTMENT Provider Note   CSN: [id number] Arrival date & time: 09/23/18  1125     History   Chief Complaint Chief Complaint  Patient presents with  . Hypertension    HPI Tiffany Sweeney is a 84 y.o. female.     Patient states her blood pressures been running high.  Patient states she was unable to get her blood pressure today but she felt fine  The history is provided by the patient. No language interpreter was used.  Hypertension This is a recurrent problem. The current episode started more than 1 week ago. The problem occurs constantly. The problem has not changed since onset.Pertinent negatives include no chest pain, no abdominal pain and no headaches. Nothing aggravates the symptoms. Nothing relieves the symptoms.    Past Medical History:  Diagnosis Date  . Anxiety   . Arthritis   . Breast cancer ([place])   . Cancer [facility])    breast- right  . Family history of colon cancer   . Family history of ovarian cancer   . Family history of pancreatic cancer   . High cholesterol   . Hypertension   . Skin cancer    Patient stated  she has low grade skin cancer in right hand    Patient Active Problem List   Diagnosis Date Noted  . Health care maintenance 11/02/2015  . Blood glucose elevated 11/02/2015  . OA (osteoarthritis) of hip 08/05/2015  . Genetic testing 04/29/2014  . Family history of ovarian cancer   . Family history of colon cancer   . Family history of pancreatic cancer   . Breast cancer ([place]) 11/04/2013  . Low back pain 11/12/2012    Past Surgical History:  Procedure Laterality Date  . ABDOMINAL HYSTERECTOMY    . CATARACT EXTRACTION W/PHACO Left 03/11/2013   Procedure: CATARACT EXTRACTION PHACO AND INTRAOCULAR LENS PLACEMENT (IOC);  Surgeon: Tonny Branch, MD;  Location: [facility];  Service: Ophthalmology;  Laterality: Left;  CDE:7.54  . CATARACT EXTRACTION W/PHACO Right 04/15/2013   Procedure: CATARACT EXTRACTION PHACO AND INTRAOCULAR LENS  PLACEMENT (IOC);  Surgeon: Tonny Branch, MD;  Location: [facility];  Service: Ophthalmology;  Laterality: Right;  CDE 9.38  . MASTECTOMY Right   . MASTECTOMY MODIFIED RADICAL Left 11/04/2013   Procedure: LEFT MASTECTOMY MODIFIED RADICAL;  Surgeon: Jamesetta So, MD;  Location: [facility] ORS;  Service: General;  Laterality: Left;  . PORTACATH PLACEMENT Right 12/06/2013   Procedure: INSERTION PORT-A-CATH-Right subclavian;  Surgeon: Jamesetta So, MD;  Location: [facility];  Service: General;  Laterality: Right;  . TOTAL HIP ARTHROPLASTY Right 08/05/2015   Procedure: RIGHT TOTAL HIP ARTHROPLASTY ANTERIOR APPROACH;  Surgeon: Gaynelle Arabian, MD;  Location: [facility];  Service: Orthopedics;  Laterality: Right;     OB History    Gravida  4   Para  3   Term  3   Preterm      AB  1   Living  2     SAB  1   TAB      Ectopic      Multiple      Live Births               Home Medications    Prior to Admission medications   Medication Sig Start Date End Date Taking? Authorizing Provider  atenolol (TENORMIN) 50 MG tablet Take 50 mg by mouth daily.    [provider]  docusate sodium (COLACE) 100 MG capsule Take 100 mg  by mouth daily as needed for mild constipation.     [provider]  lidocaine-prilocaine (EMLA) cream  09/25/17   [provider]  LORazepam (ATIVAN) 0.5 MG tablet Take 0.5 mg by mouth every 8 (eight) hours as needed for anxiety.  07/11/17   [provider]  losartan (COZAAR) 100 MG tablet  08/20/18   [provider]  torsemide (DEMADEX) 10 MG tablet Take 10 mg by mouth daily.  07/03/17   [provider]    Family History Family History  Problem Relation Age of Onset  . Diabetes Mother   . Rectal cancer Mother 89       died from complications of DM  . Dementia Father        Died at 21 in an accident  . Pancreatic cancer Brother 58  . Lung cancer Sister 70       "small oat cell"  . Ovarian cancer Sister 58  . Cancer Cousin         maternal 1st cousin with either ovarian or uterine  . Colon cancer Cousin        paternal first cousin dx at unknown age    Social History Social History   Tobacco Use  . Smoking status: Former Smoker    Packs/day: 0.25    Years: 30.00    Pack years: 7.50    Types: Cigarettes    Start date: 10/10/1956    Quit date: 03/08/1985    Years since quitting: 33.5  . Smokeless tobacco: Never Used  . Tobacco comment: quit 25 years ago  Substance Use Topics  . Alcohol use: No  . Drug use: No     Allergies   Patient has no known allergies.   Review of Systems Review of Systems  Constitutional: Negative for appetite change and fatigue.  HENT: Negative for congestion, ear discharge and sinus pressure.   Eyes: Negative for discharge.  Respiratory: Negative for cough.   Cardiovascular: Negative for chest pain.  Gastrointestinal: Negative for abdominal pain and diarrhea.  Genitourinary: Negative for frequency and hematuria.  Musculoskeletal: Negative for back pain.  Skin: Negative for rash.  Neurological: Negative for seizures and headaches.  Psychiatric/Behavioral: Negative for hallucinations.     Physical Exam Updated Vital Signs BP (!) 194/91   Pulse (!) 58   Temp 97.8 F (36.6 C) (Oral)   Resp 12   Ht 5\' 4"  (1.626 m)   Wt 74.8 kg   SpO2 97%   BMI 28.32 kg/m   Physical Exam Vitals signs and nursing note reviewed.  Constitutional:      Appearance: She is well-developed.  HENT:     Head: Normocephalic.     Nose: Nose normal.  Eyes:     General: No scleral icterus.    Conjunctiva/sclera: Conjunctivae normal.  Neck:     Musculoskeletal: Neck supple.     Thyroid: No thyromegaly.  Cardiovascular:     Rate and Rhythm: Normal rate and regular rhythm.     Heart sounds: No murmur. No friction rub. No gallop.   Pulmonary:     Breath sounds: No stridor. No wheezing or rales.  Chest:     Chest wall: No tenderness.  Abdominal:     General: There is no distension.      Tenderness: There is no abdominal tenderness. There is no rebound.  Musculoskeletal: Normal range of motion.  Lymphadenopathy:     Cervical: No cervical adenopathy.  Skin:    Findings: No  erythema or rash.  Neurological:     Mental Status: She is oriented to person, place, and time.     Motor: No abnormal muscle tone.     Coordination: Coordination normal.  Psychiatric:        Behavior: Behavior normal.      ED Treatments / Results  Labs (all labs ordered are listed, but only abnormal results are displayed) Labs Reviewed  COMPREHENSIVE METABOLIC PANEL - Abnormal; Notable for the following components:      Result Value   Potassium 3.4 (*)    Glucose, Bld 107 (*)    Total Protein 6.3 (*)    All other components within normal limits  CBC WITH DIFFERENTIAL/PLATELET    EKG None  Radiology Ct Head Wo Contrast  Result Date: 09/23/2018 CLINICAL DATA:  Altered level of consciousness, unexplained. History of breast cancer status post bilateral mastectomy. EXAM: CT HEAD WITHOUT CONTRAST TECHNIQUE: Contiguous axial images were obtained from the base of the skull through the vertex without intravenous contrast. COMPARISON:  None. FINDINGS: Brain: No hydrocephalus. No mass, hemorrhage, edema or other evidence of acute parenchymal abnormality. No extra-axial hemorrhage. Vascular: No hyperdense vessel or unexpected calcification. Skull: Normal. Negative for fracture or focal lesion. Sinuses/Orbits: No acute finding. Other: None. IMPRESSION: Negative head CT. No intracranial mass, hemorrhage or edema. Electronically Signed   By: Franki Cabot M.D.   On: 09/23/2018 12:24    Procedures Procedures (including critical care time)  Medications Ordered in ED Medications  hydrALAZINE (APRESOLINE) injection 5 mg (5 mg Intravenous Given 09/23/18 1152)     Initial Impression / Assessment and Plan / ED Course  I have reviewed the triage vital signs and the nursing notes.  Pertinent labs & imaging  results that were available during my care of the patient were reviewed by me and considered in my medical decision making (see chart for details).        Labs unremarkable.  CT scan negative.  Patient received some hydralazine that helped her blood pressure.  Patient will follow-up with her family doctor this week for possible change in blood pressure medicine Final Clinical Impressions(s) / ED Diagnoses   Final diagnoses:  Essential hypertension    ED Discharge Orders    None       Milton Ferguson, MD 09/23/18 1243

## 2018-09-26 ENCOUNTER — Encounter (HOSPITAL_COMMUNITY): Payer: Self-pay | Admitting: Emergency Medicine

## 2018-09-26 ENCOUNTER — Other Ambulatory Visit: Payer: Self-pay

## 2018-09-26 ENCOUNTER — Emergency Department (HOSPITAL_COMMUNITY)
Admission: EM | Admit: 2018-09-26 | Discharge: 2018-09-26 | Discharge status: Against Medical Advice | Payer: Medicare Other | Attending: Emergency Medicine | Admitting: Emergency Medicine

## 2018-09-26 DIAGNOSIS — Z5321 Procedure and treatment not carried out due to patient leaving prior to being seen by health care provider: Secondary | ICD-10-CM | POA: Insufficient documentation

## 2018-09-26 DIAGNOSIS — I10 Essential (primary) hypertension: Secondary | ICD-10-CM | POA: Diagnosis present

## 2018-09-26 NOTE — ED Triage Notes (Signed)
Pt states that she was seen over the weekend for hypertension she was told to return if her blood pressure is over 200 she was started on a new medication for hypertension but her sbp was 214 at home.

## 2018-10-08 ENCOUNTER — Inpatient Hospital Stay (HOSPITAL_COMMUNITY): Payer: Medicare Other

## 2018-10-09 ENCOUNTER — Other Ambulatory Visit: Payer: Self-pay

## 2018-10-09 ENCOUNTER — Inpatient Hospital Stay (HOSPITAL_COMMUNITY): Payer: Medicare Other | Attending: Hematology

## 2018-10-09 DIAGNOSIS — E7800 Pure hypercholesterolemia, unspecified: Secondary | ICD-10-CM | POA: Insufficient documentation

## 2018-10-09 DIAGNOSIS — R600 Localized edema: Secondary | ICD-10-CM | POA: Diagnosis not present

## 2018-10-09 DIAGNOSIS — Z171 Estrogen receptor negative status [ER-]: Secondary | ICD-10-CM | POA: Diagnosis not present

## 2018-10-09 DIAGNOSIS — C50512 Malignant neoplasm of lower-outer quadrant of left female breast: Secondary | ICD-10-CM | POA: Insufficient documentation

## 2018-10-09 DIAGNOSIS — Z9013 Acquired absence of bilateral breasts and nipples: Secondary | ICD-10-CM | POA: Diagnosis not present

## 2018-10-09 DIAGNOSIS — Z9221 Personal history of antineoplastic chemotherapy: Secondary | ICD-10-CM | POA: Diagnosis not present

## 2018-10-09 DIAGNOSIS — I890 Lymphedema, not elsewhere classified: Secondary | ICD-10-CM | POA: Insufficient documentation

## 2018-10-09 LAB — COMPREHENSIVE METABOLIC PANEL
ALT: 15 U/L (ref 0–44)
AST: 20 U/L (ref 15–41)
Albumin: 3.9 g/dL (ref 3.5–5.0)
Alkaline Phosphatase: 62 U/L (ref 38–126)
Anion gap: 8 (ref 5–15)
BUN: 19 mg/dL (ref 8–23)
CO2: 27 mmol/L (ref 22–32)
Calcium: 8.7 mg/dL — ABNORMAL LOW (ref 8.9–10.3)
Chloride: 105 mmol/L (ref 98–111)
Creatinine, Ser: 0.83 mg/dL (ref 0.44–1.00)
GFR calc Af Amer: >60 mL/min (ref >60)
GFR calc non Af Amer: >60 mL/min (ref >60)
Glucose, Bld: 116 mg/dL — ABNORMAL HIGH (ref 70–99)
Potassium: 3.4 mmol/L — ABNORMAL LOW (ref 3.5–5.1)
Sodium: 140 mmol/L (ref 135–145)
Total Bilirubin: 0.8 mg/dL (ref 0.3–1.2)
Total Protein: 6.9 g/dL (ref 6.5–8.1)

## 2018-10-09 LAB — CBC WITH DIFFERENTIAL/PLATELET
Abs Immature Granulocytes: 0.01 10*3/uL (ref 0.00–0.07)
Basophils Absolute: 0.1 10*3/uL (ref 0.0–0.1)
Basophils Relative: 1 %
Eosinophils Absolute: 0.2 10*3/uL (ref 0.0–0.5)
Eosinophils Relative: 3 %
HCT: 44.2 % (ref 36.0–46.0)
Hemoglobin: 14.1 g/dL (ref 12.0–15.0)
Immature Granulocytes: 0 %
Lymphocytes Relative: 24 %
Lymphs Abs: 1.4 10*3/uL (ref 0.7–4.0)
MCH: 29.7 pg (ref 26.0–34.0)
MCHC: 31.9 g/dL (ref 30.0–36.0)
MCV: 93.1 fL (ref 80.0–100.0)
Monocytes Absolute: 0.5 10*3/uL (ref 0.1–1.0)
Monocytes Relative: 9 %
Neutro Abs: 3.9 10*3/uL (ref 1.7–7.7)
Neutrophils Relative %: 63 %
Platelets: 224 10*3/uL (ref 150–400)
RBC: 4.75 MIL/uL (ref 3.87–5.11)
RDW: 13.1 % (ref 11.5–15.5)
WBC: 6.1 10*3/uL (ref 4.0–10.5)
nRBC: 0 % (ref 0.0–0.2)

## 2018-10-09 LAB — LIPID PANEL
Cholesterol: 255 mg/dL — ABNORMAL HIGH (ref 0–200)
HDL: 49 mg/dL (ref >40)
LDL Cholesterol: 181 mg/dL — ABNORMAL HIGH (ref 0–99)
Total CHOL/HDL Ratio: 5.2 RATIO
Triglycerides: 126 mg/dL (ref <150)
VLDL: 25 mg/dL (ref 0–40)

## 2018-10-09 MED ORDER — HEPARIN SOD (PORK) LOCK FLUSH 100 UNIT/ML IV SOLN
500.0000 [IU] | Freq: Once | INTRAVENOUS | Status: AC
  Administered 2018-10-09: 500 [IU] via INTRAVENOUS

## 2018-10-09 MED ORDER — SODIUM CHLORIDE 0.9% FLUSH
10.0000 mL | INTRAVENOUS | Status: DC | PRN
  Administered 2018-10-09: 09:00:00 10 mL via INTRAVENOUS
  Filled 2018-10-09: qty 10

## 2018-10-09 NOTE — Progress Notes (Signed)
Tiffany Sweeney presented for Portacath access and flush. Portacath located right chest wall accessed with  H 20 needle. Good blood return present. Portacath flushed with 55ml NS and 500U/58ml Heparin and needle removed intact. Procedure without incident. Patient tolerated procedure well. Labs drawn per orders.    Vitals stable and discharged home from clinic ambulatory. Follow up as scheduled.

## 2018-10-10 LAB — CANCER ANTIGEN 15-3: CA 15-3: 19.9 U/mL (ref 0.0–25.0)

## 2018-10-15 ENCOUNTER — Encounter (HOSPITAL_COMMUNITY): Payer: Self-pay | Admitting: Hematology

## 2018-10-15 ENCOUNTER — Inpatient Hospital Stay (HOSPITAL_BASED_OUTPATIENT_CLINIC_OR_DEPARTMENT_OTHER): Payer: Medicare Other | Admitting: Hematology

## 2018-10-15 ENCOUNTER — Other Ambulatory Visit: Payer: Self-pay

## 2018-10-15 DIAGNOSIS — Z171 Estrogen receptor negative status [ER-]: Secondary | ICD-10-CM | POA: Diagnosis not present

## 2018-10-15 DIAGNOSIS — C50512 Malignant neoplasm of lower-outer quadrant of left female breast: Secondary | ICD-10-CM | POA: Diagnosis not present

## 2018-10-15 MED ORDER — LANREOTIDE ACETATE 120 MG/0.5ML ~~LOC~~ SOLN
SUBCUTANEOUS | Status: AC
  Filled 2018-10-15: qty 120

## 2018-10-15 NOTE — Assessment & Plan Note (Signed)
1.  Stage I (T1CN0) triple negative left breast cancer: -Status post left mastectomy Lymph node dissection on 11/04/2013, 1.8 cm IDC, 0/9 lymph nodes positive. -Status post 3 cycles of TC in adjuvant setting, fourth cycle not given due to fatigue and neuropathy, completed on 02/03/2014. -Genetic testing negative in April 2016. - Bilateral mastectomy sites did not reveal any suspicious nodules.  No palpable adenopathy. - Her blood work including tumor marker is within normal limits. - She will come back in 1 year with blood work and physical exam.  2.  Right breast cancer: - She has remote history of right breast cancer and underwent right mastectomy.  3.  Bilateral lymphedema: -She has lymphedema in both upper extremities, right more than left. -She also has bilateral lower extremity edema.  She has noticed ulceration on the left tibia a week ago.  She reported having a skin lesion at that site for 3 years.  I have recommended her to see Dr. Nevada Crane.

## 2018-10-15 NOTE — Patient Instructions (Signed)
[facility] at [facility] Discharge Instructions  You were seen today by Dr. Katragadda. He went over your recent lab results. He will see you back in  for labs and follow up.   Thank you for choosing  [facility] at [facility] to provide your oncology and hematology care.  To afford each patient quality time with our provider, please arrive at least 15 minutes before your scheduled appointment time.   If you have a lab appointment with the Cancer Center please come in thru the  Main Entrance and check in at the main information desk  You need to re-schedule your appointment should you arrive 10 or more minutes late.  We strive to give you quality time with our providers, and arriving late affects you and other patients whose appointments are after yours.  Also, if you no show three or more times for appointments you may be dismissed from the clinic at the providers discretion.     Again, thank you for choosing [facility].  Our hope is that these requests will decrease the amount of time that you wait before being seen by our physicians.       _____________________________________________________________  Should you have questions after your visit to [facility], please contact our office at [phone] between the hours of 8:00 a.m. and 4:30 p.m.  Voicemails left after 4:00 p.m. will not be returned until the following business day.  For prescription refill requests, have your pharmacy contact our office and allow 72 hours.    Cancer Center Support Programs:   > Cancer Support Group  2nd Tuesday of the month 1pm-2pm, Journey Room    

## 2018-10-15 NOTE — Progress Notes (Signed)
[place] [place], [place] [address]   CLINIC:  Medical Oncology/Hematology  PCP:  Lemmie Evens, MD [place] [address] [phone]   REASON FOR VISIT: Follow-up for breast cancer, ER-/PR-/HER2-  CURRENT THERAPY: Active surveillance.  BRIEF ONCOLOGIC HISTORY:  Oncology History  Breast cancer ([place])  07/25/2013 Imaging   Plain film of the lumbar spine, complete 4 view. Chronic lumbar degenerative change most prominent at L4-5. No acute abnormality and no change from prior study   10/21/2013 Initial Diagnosis   Left breast cancer, core biopsy, ER negative, PR negative, HER-2/neu negative, Ki-67 of 13%, infiltrating duct cell   11/04/2013 Surgery   Left modified radical mastectomy, 1.8 cm infiltrating duct cell carcinoma, ER negative, PR negative, HER-2/neu not overexpressed, 9 lymph nodes negative. Stage TI cN0 M0.   12/06/2013 Imaging   Portable chest x-ray showing no pneumothorax no lung edema or consolidation Port-A-Cath tip in the SVC.   12/23/2013 - 02/03/2014 Chemotherapy   Cycle 1 of TC initiated on 12/23/2013, patient completed 3 cycles of prescirbed therapy      CANCER STAGING: Cancer Staging Breast cancer ([place]) Staging form: Breast, AJCC 7th Edition - Clinical: Stage IA (T1a, N0, M0) - Unsigned    INTERVAL HISTORY:  Tiffany Sweeney 84 y.o. female seen for follow-up of triple negative left breast cancer.  She denies any new onset pains.  She reported having a skin lesion on the left shin, started discharging a week ago.  Prior to that she had a lesion there for 3 years.  Denies any fevers, night sweats or weight loss in the last 6 months.  Denies any nausea, vomiting, diarrhea or constipation.  Leg swellings have been stable.  Numbness in the feet is also stable.  Appetite is 100%.  Energy levels are 50%.   REVIEW OF SYSTEMS:  Review of Systems  Constitutional: Positive for fatigue.  Cardiovascular: Positive for  leg swelling.  Neurological: Positive for numbness (feet).  All other systems reviewed and are negative.    PAST MEDICAL/SURGICAL HISTORY:  Past Medical History:  Diagnosis Date  . Anxiety   . Arthritis   . Breast cancer ([place])   . Cancer [facility])    breast- right  . Family history of colon cancer   . Family history of ovarian cancer   . Family history of pancreatic cancer   . High cholesterol   . Hypertension   . Skin cancer    Patient stated  she has low grade skin cancer in right hand   Past Surgical History:  Procedure Laterality Date  . ABDOMINAL HYSTERECTOMY    . CATARACT EXTRACTION W/PHACO Left 03/11/2013   Procedure: CATARACT EXTRACTION PHACO AND INTRAOCULAR LENS PLACEMENT (IOC);  Surgeon: Tonny Branch, MD;  Location: [facility];  Service: Ophthalmology;  Laterality: Left;  CDE:7.54  . CATARACT EXTRACTION W/PHACO Right 04/15/2013   Procedure: CATARACT EXTRACTION PHACO AND INTRAOCULAR LENS PLACEMENT (IOC);  Surgeon: Tonny Branch, MD;  Location: [facility];  Service: Ophthalmology;  Laterality: Right;  CDE 9.38  . MASTECTOMY Right   . MASTECTOMY MODIFIED RADICAL Left 11/04/2013   Procedure: LEFT MASTECTOMY MODIFIED RADICAL;  Surgeon: Jamesetta So, MD;  Location: [facility];  Service: General;  Laterality: Left;  . PORTACATH PLACEMENT Right 12/06/2013   Procedure: INSERTION PORT-A-CATH-Right subclavian;  Surgeon: Jamesetta So, MD;  Location: [facility];  Service: General;  Laterality: Right;  . TOTAL HIP ARTHROPLASTY Right 08/05/2015   Procedure: RIGHT TOTAL HIP  ARTHROPLASTY ANTERIOR APPROACH;  Surgeon: Gaynelle Arabian, MD;  Location: [facility];  Service: Orthopedics;  Laterality: Right;     SOCIAL HISTORY:  Social History   Socioeconomic History  . Marital status: Widowed    Spouse name: Not on file  . Number of children: 3  . Years of education: Not on file  . Highest education level: Not on file  Occupational History  . Not on file  Social Needs  . Financial resource strain: Not on file   . Food insecurity    Worry: Not on file    Inability: Not on file  . Transportation needs    Medical: Not on file    Non-medical: Not on file  Tobacco Use  . Smoking status: Former Smoker    Packs/day: 0.25    Years: 30.00    Pack years: 7.50    Types: Cigarettes    Start date: 10/10/1956    Quit date: 03/08/1985    Years since quitting: 33.6  . Smokeless tobacco: Never Used  . Tobacco comment: quit 25 years ago  Substance and Sexual Activity  . Alcohol use: No  . Drug use: No  . Sexual activity: Yes    Birth control/protection: Post-menopausal  Lifestyle  . Physical activity    Days per week: Not on file    Minutes per session: Not on file  . Stress: Not on file  Relationships  . Social Herbalist on phone: Not on file    Gets together: Not on file    Attends religious service: Not on file    Active member of club or organization: Not on file    Attends meetings of clubs or organizations: Not on file    Relationship status: Not on file  . Intimate partner violence    Fear of current or ex partner: Not on file    Emotionally abused: Not on file    Physically abused: Not on file    Forced sexual activity: Not on file  Other Topics Concern  . Not on file  Social History Narrative  . Not on file    FAMILY HISTORY:  Family History  Problem Relation Age of Onset  . Diabetes Mother   . Rectal cancer Mother 65       died from complications of DM  . Dementia Father        Died at 64 in an accident  . Pancreatic cancer Brother 50  . Lung cancer Sister 35       "small oat cell"  . Ovarian cancer Sister 40  . Cancer Cousin        maternal 1st cousin with either ovarian or uterine  . Colon cancer Cousin        paternal first cousin dx at unknown age    CURRENT MEDICATIONS:  Outpatient Encounter Medications as of 10/15/2018  Medication Sig  . amLODipine (NORVASC) 5 MG tablet   . atenolol (TENORMIN) 50 MG tablet Take 50 mg by mouth daily.  Marland Kitchen losartan  (COZAAR) 100 MG tablet   . lidocaine-prilocaine (EMLA) cream   . LORazepam (ATIVAN) 0.5 MG tablet Take 0.5 mg by mouth every 8 (eight) hours as needed for anxiety.   . torsemide (DEMADEX) 10 MG tablet Take 10 mg by mouth daily.   . [DISCONTINUED] docusate sodium (COLACE) 100 MG capsule Take 100 mg by mouth daily as needed for mild constipation.    Facility-Administered Encounter Medications as of 10/15/2018  Medication  .  sodium chloride 0.9 % injection 10 mL  . sodium chloride flush (NS) 0.9 % injection 10 mL  . sodium chloride flush (NS) 0.9 % injection 10 mL  . lanreotide acetate (SOMATULINE DEPOT) 120 MG/0.5ML injection    ALLERGIES:  No Known Allergies   PHYSICAL EXAM:  ECOG Performance status: 1  Vitals:   10/15/18 0830  BP: (!) 152/71  Pulse: 64  Resp: 18  Temp: (!) 97.1 F (36.2 C)  SpO2: 100%   Filed Weights   10/15/18 0830  Weight: 159 lb 12.8 oz (72.5 kg)    Physical Exam Constitutional:      Appearance: She is well-developed.  Cardiovascular:     Rate and Rhythm: Normal rate and regular rhythm.     Heart sounds: Normal heart sounds.  Pulmonary:     Breath sounds: Normal breath sounds.  Abdominal:     General: There is no distension.     Palpations: Abdomen is soft. There is no mass.  Musculoskeletal: Normal range of motion.  Skin:    General: Skin is warm and dry.  Neurological:     Mental Status: She is alert and oriented to person, place, and time.  Psychiatric:        Behavior: Behavior normal.        Thought Content: Thought content normal.        Judgment: Judgment normal.   Bilateral mastectomy sites are within normal limits.   LABORATORY DATA:  I have reviewed the labs as listed.  CBC    Component Value Date/Time   WBC 6.1 10/09/2018 0841   RBC 4.75 10/09/2018 0841   HGB 14.1 10/09/2018 0841   HGB 14.0 10/10/2013 1010   HGB 13.0 07/06/2006 1141   HCT 44.2 10/09/2018 0841   HCT 41.8 10/10/2013 1010   HCT 38.2 07/06/2006 1141    PLT 224 10/09/2018 0841   PLT 220 10/10/2013 1010   PLT 272 07/06/2006 1141   MCV 93.1 10/09/2018 0841   MCV 89 10/10/2013 1010   MCV 85.8 07/06/2006 1141   MCH 29.7 10/09/2018 0841   MCHC 31.9 10/09/2018 0841   RDW 13.1 10/09/2018 0841   RDW 13.3 10/10/2013 1010   RDW 11.2 (L) 07/06/2006 1141   LYMPHSABS 1.4 10/09/2018 0841   LYMPHSABS 2.0 10/10/2013 1010   LYMPHSABS 2.1 07/06/2006 1141   MONOABS 0.5 10/09/2018 0841   MONOABS 0.6 07/06/2006 1141   EOSABS 0.2 10/09/2018 0841   EOSABS 0.1 10/10/2013 1010   BASOSABS 0.1 10/09/2018 0841   BASOSABS 0.0 10/10/2013 1010   BASOSABS 0.1 07/06/2006 1141   CMP Latest Ref Rng & Units 10/09/2018 09/23/2018 04/11/2018  Glucose 70 - 99 mg/dL 116(H) 107(H) 113(H)  BUN 8 - 23 mg/dL 19 15 18   Creatinine 0.44 - 1.00 mg/dL 0.83 0.75 0.80  Sodium 135 - 145 mmol/L 140 141 142  Potassium 3.5 - 5.1 mmol/L 3.4(L) 3.4(L) 3.7  Chloride 98 - 111 mmol/L 105 106 106  CO2 22 - 32 mmol/L 27 25 27   Calcium 8.9 - 10.3 mg/dL 8.7(L) 8.9 9.0  Total Protein 6.5 - 8.1 g/dL 6.9 6.3(L) 6.5  Total Bilirubin 0.3 - 1.2 mg/dL 0.8 0.8 0.8  Alkaline Phos 38 - 126 U/L 62 64 64  AST 15 - 41 U/L 20 18 17   ALT 0 - 44 U/L 15 14 13          ASSESSMENT & PLAN:   Breast cancer 1.  Stage I (T1CN0) triple negative left breast cancer: -Status  post left mastectomy Lymph node dissection on 11/04/2013, 1.8 cm IDC, 0/9 lymph nodes positive. -Status post 3 cycles of TC in adjuvant setting, fourth cycle not given due to fatigue and neuropathy, completed on 02/03/2014. -Genetic testing negative in April 2016. - Bilateral mastectomy sites did not reveal any suspicious nodules.  No palpable adenopathy. - Her blood work including tumor marker is within normal limits. - She will come back in 1 year with blood work and physical exam.  2.  Right breast cancer: - She has remote history of right breast cancer and underwent right mastectomy.  3.  Bilateral lymphedema: -She has lymphedema  in both upper extremities, right more than left. -She also has bilateral lower extremity edema.  She has noticed ulceration on the left tibia a week ago.  She reported having a skin lesion at that site for 3 years.  I have recommended her to see Dr. Nevada Crane.      Orders placed this encounter:  No orders of the defined types were placed in this encounter.     Derek Jack, MD [place] [phone]

## 2018-10-16 ENCOUNTER — Other Ambulatory Visit (HOSPITAL_COMMUNITY): Payer: Self-pay | Admitting: *Deleted

## 2018-10-16 DIAGNOSIS — Z171 Estrogen receptor negative status [ER-]: Secondary | ICD-10-CM

## 2018-10-16 DIAGNOSIS — C50512 Malignant neoplasm of lower-outer quadrant of left female breast: Secondary | ICD-10-CM

## 2018-11-23 ENCOUNTER — Encounter (HOSPITAL_COMMUNITY): Payer: Medicare Other

## 2019-01-15 ENCOUNTER — Encounter (HOSPITAL_COMMUNITY): Payer: Medicare Other

## 2019-01-23 ENCOUNTER — Other Ambulatory Visit: Payer: Self-pay

## 2019-01-23 ENCOUNTER — Encounter (HOSPITAL_COMMUNITY): Payer: Self-pay

## 2019-01-23 ENCOUNTER — Inpatient Hospital Stay (HOSPITAL_COMMUNITY): Payer: Medicare Other | Attending: Hematology

## 2019-01-23 DIAGNOSIS — Z452 Encounter for adjustment and management of vascular access device: Secondary | ICD-10-CM | POA: Diagnosis not present

## 2019-01-23 DIAGNOSIS — Z171 Estrogen receptor negative status [ER-]: Secondary | ICD-10-CM | POA: Insufficient documentation

## 2019-01-23 DIAGNOSIS — C50512 Malignant neoplasm of lower-outer quadrant of left female breast: Secondary | ICD-10-CM | POA: Insufficient documentation

## 2019-01-23 MED ORDER — HEPARIN SOD (PORK) LOCK FLUSH 100 UNIT/ML IV SOLN
500.0000 [IU] | Freq: Once | INTRAVENOUS | Status: AC
  Administered 2019-01-23: 500 [IU] via INTRAVENOUS

## 2019-01-23 MED ORDER — SODIUM CHLORIDE 0.9% FLUSH
10.0000 mL | INTRAVENOUS | Status: DC | PRN
  Administered 2019-01-23: 15:00:00 10 mL via INTRAVENOUS

## 2019-01-23 NOTE — Patient Instructions (Signed)
[facility] at [facility] Discharge Instructions  Portacath flushed per protocol today. Follow-up as scheduled. Call clinic for any questions or concerns   Thank you for choosing [facility] at [facility] to provide your oncology and hematology care.  To afford each patient quality time with our provider, please arrive at least 15 minutes before your scheduled appointment time.   If you have a lab appointment with the Cancer Center please come in thru the Main Entrance and check in at the main information desk.  You need to re-schedule your appointment should you arrive 10 or more minutes late.  We strive to give you quality time with our providers, and arriving late affects you and other patients whose appointments are after yours.  Also, if you no show three or more times for appointments you may be dismissed from the clinic at the providers discretion.     Again, thank you for choosing [facility].  Our hope is that these requests will decrease the amount of time that you wait before being seen by our physicians.       _____________________________________________________________  Should you have questions after your visit to [facility], please contact our office at [phone] between the hours of 8:00 a.m. and 4:30 p.m.  Voicemails left after 4:00 p.m. will not be returned until the following business day.  For prescription refill requests, have your pharmacy contact our office and allow 72 hours.    Due to Covid, you will need to wear a mask upon entering the hospital. If you do not have a mask, a mask will be given to you at the Main Entrance upon arrival. For doctor visits, patients may have 1 support person with them. For treatment visits, patients can not have anyone with them due to social distancing guidelines and our immunocompromised population.     

## 2019-01-23 NOTE — Progress Notes (Signed)
Tiffany Sweeney tolerated port flush well without complaints or incident. Port accessed with 20 gauge needle with blood return noted then flushed with 10 ml NS and 5 ml Heparin easily per protocol then de-accessed. VSS Pt discharged self ambulatory in satisfactory condition

## 2019-03-18 ENCOUNTER — Emergency Department (HOSPITAL_COMMUNITY)
Admission: EM | Admit: 2019-03-18 | Discharge: 2019-03-18 | Disposition: A | Discharge status: Home / Self Care | Payer: Medicare Other | Attending: Emergency Medicine | Admitting: Emergency Medicine

## 2019-03-18 ENCOUNTER — Emergency Department (HOSPITAL_COMMUNITY): Payer: Medicare Other

## 2019-03-18 ENCOUNTER — Encounter (HOSPITAL_COMMUNITY): Payer: Self-pay | Admitting: Emergency Medicine

## 2019-03-18 ENCOUNTER — Other Ambulatory Visit: Payer: Self-pay

## 2019-03-18 DIAGNOSIS — I10 Essential (primary) hypertension: Secondary | ICD-10-CM | POA: Insufficient documentation

## 2019-03-18 DIAGNOSIS — Z79899 Other long term (current) drug therapy: Secondary | ICD-10-CM | POA: Diagnosis not present

## 2019-03-18 DIAGNOSIS — Z85828 Personal history of other malignant neoplasm of skin: Secondary | ICD-10-CM | POA: Diagnosis not present

## 2019-03-18 DIAGNOSIS — Z853 Personal history of malignant neoplasm of breast: Secondary | ICD-10-CM | POA: Diagnosis not present

## 2019-03-18 DIAGNOSIS — R2243 Localized swelling, mass and lump, lower limb, bilateral: Secondary | ICD-10-CM | POA: Insufficient documentation

## 2019-03-18 DIAGNOSIS — Z96641 Presence of right artificial hip joint: Secondary | ICD-10-CM | POA: Insufficient documentation

## 2019-03-18 DIAGNOSIS — M79605 Pain in left leg: Secondary | ICD-10-CM

## 2019-03-18 DIAGNOSIS — Z87891 Personal history of nicotine dependence: Secondary | ICD-10-CM | POA: Diagnosis not present

## 2019-03-18 MED ORDER — LOSARTAN POTASSIUM 25 MG PO TABS
100.0000 mg | ORAL_TABLET | Freq: Once | ORAL | Status: AC
  Administered 2019-03-18: 100 mg via ORAL
  Filled 2019-03-18: qty 4

## 2019-03-18 MED ORDER — ATENOLOL 25 MG PO TABS
50.0000 mg | ORAL_TABLET | Freq: Once | ORAL | Status: DC
  Filled 2019-03-18: qty 2

## 2019-03-18 MED ORDER — TORSEMIDE 10 MG PO TABS
10.0000 mg | ORAL_TABLET | Freq: Once | ORAL | Status: DC
  Filled 2019-03-18: qty 1

## 2019-03-18 NOTE — ED Triage Notes (Signed)
PT states she normally takes her blood pressure medications in the am but didn't take them until around 1800 yesterday. PT states her blood pressure top number was 190 last night and 230 this am. PT denies any symptoms of high blood pressure and did not take any medications prior to ED arrival. PT states some increased swelling to lower legs over the past few days.

## 2019-03-18 NOTE — Discharge Instructions (Addendum)
Follow-up with Dr. Karie Kirks to see if you need further adjustments of your medications.

## 2019-03-18 NOTE — ED Notes (Signed)
PT takes Torsemide 10mg  daily, Atenolol 50mg  daily and Losartan Potassium 100mg  daily.

## 2019-03-18 NOTE — ED Provider Notes (Signed)
[name] EMERGENCY DEPARTMENT Provider Note   CSN: [id number] Arrival date & time: 03/18/19  [id number]     History Chief Complaint  Patient presents with  . Hypertension    Tiffany Sweeney is a 85 y.o. female.  HPI Patient presents to hypertension.  History of hypertension states she has had difficulty controlling her blood pressure over the last year.  States her PCP has been adjusting her medicines with sometimes good and sometimes poor results.  She is on torsemide 10 mg atenolol 50 mg and losartan 100 mg daily.  States that she took her medicines as normal Saturday morning but yesterday, Sunday she was out of town and forgot her medicines.  States she did not take them until around 6:00 last night.  States she checked her blood pressure last night it was 99991111 systolic.  States she was in the wait till this morning and this morning when she got up it was 123456 systolic.  No chest pain.  No trouble breathing.  Has some chronic swelling in both her legs.  States this may be slightly more than before but basically unchanged.  States however that she does have more swelling and pain on the lateral aspect of her left lower leg.  States she has had some cancer removed from this leg.  No numbness or weakness.  States she has compression stockings she wears and also some medicine that she puts on her lower leg.  States she cannot however put them on at the same time.    Past Medical History:  Diagnosis Date  . Anxiety   . Arthritis   . Breast cancer ([place])   . Cancer [facility])    breast- right  . Family history of colon cancer   . Family history of ovarian cancer   . Family history of pancreatic cancer   . High cholesterol   . Hypertension   . Skin cancer    Patient stated  she has low grade skin cancer in right hand    Patient Active Problem List   Diagnosis Date Noted  . Health care maintenance 11/02/2015  . Blood glucose elevated 11/02/2015  . OA (osteoarthritis) of hip 08/05/2015  .  Genetic testing 04/29/2014  . Family history of ovarian cancer   . Family history of colon cancer   . Family history of pancreatic cancer   . Breast cancer ([place]) 11/04/2013  . Low back pain 11/12/2012    Past Surgical History:  Procedure Laterality Date  . ABDOMINAL HYSTERECTOMY    . CATARACT EXTRACTION W/PHACO Left 03/11/2013   Procedure: CATARACT EXTRACTION PHACO AND INTRAOCULAR LENS PLACEMENT (IOC);  Surgeon: Tonny Branch, MD;  Location: [facility];  Service: Ophthalmology;  Laterality: Left;  CDE:7.54  . CATARACT EXTRACTION W/PHACO Right 04/15/2013   Procedure: CATARACT EXTRACTION PHACO AND INTRAOCULAR LENS PLACEMENT (IOC);  Surgeon: Tonny Branch, MD;  Location: [facility];  Service: Ophthalmology;  Laterality: Right;  CDE 9.38  . MASTECTOMY Right   . MASTECTOMY MODIFIED RADICAL Left 11/04/2013   Procedure: LEFT MASTECTOMY MODIFIED RADICAL;  Surgeon: Jamesetta So, MD;  Location: [facility];  Service: General;  Laterality: Left;  . PORTACATH PLACEMENT Right 12/06/2013   Procedure: INSERTION PORT-A-CATH-Right subclavian;  Surgeon: Jamesetta So, MD;  Location: [facility];  Service: General;  Laterality: Right;  . TOTAL HIP ARTHROPLASTY Right 08/05/2015   Procedure: RIGHT TOTAL HIP ARTHROPLASTY ANTERIOR APPROACH;  Surgeon: Gaynelle Arabian, MD;  Location: [facility];  Service: Orthopedics;  Laterality: Right;  OB History    Gravida  4   Para  3   Term  3   Preterm      AB  1   Living  2     SAB  1   TAB      Ectopic      Multiple      Live Births              Family History  Problem Relation Age of Onset  . Diabetes Mother   . Rectal cancer Mother 51       died from complications of DM  . Dementia Father        Died at 67 in an accident  . Pancreatic cancer Brother 60  . Lung cancer Sister 63       "small oat cell"  . Ovarian cancer Sister 39  . Cancer Cousin        maternal 1st cousin with either ovarian or uterine  . Colon cancer Cousin        paternal first cousin dx at  unknown age    Social History   Tobacco Use  . Smoking status: Former Smoker    Packs/day: 0.25    Years: 30.00    Pack years: 7.50    Types: Cigarettes    Start date: 10/10/1956    Quit date: 03/08/1985    Years since quitting: 34.0  . Smokeless tobacco: Never Used  . Tobacco comment: quit 25 years ago  Substance Use Topics  . Alcohol use: No  . Drug use: No    Home Medications Prior to Admission medications   Medication Sig Start Date End Date Taking? Authorizing Provider  amLODipine (NORVASC) 5 MG tablet  09/24/18   [provider]  atenolol (TENORMIN) 50 MG tablet Take 50 mg by mouth daily.    [provider]  lidocaine-prilocaine (EMLA) cream  09/25/17   [provider]  LORazepam (ATIVAN) 0.5 MG tablet Take 0.5 mg by mouth every 8 (eight) hours as needed for anxiety.  07/11/17   [provider]  losartan (COZAAR) 100 MG tablet  08/20/18   [provider]  torsemide (DEMADEX) 10 MG tablet Take 10 mg by mouth daily.  07/03/17   [provider]    Allergies    Patient has no known allergies.  Review of Systems   Review of Systems  Constitutional: Negative for appetite change.  HENT: Negative for congestion.   Respiratory: Negative for shortness of breath.   Cardiovascular: Positive for leg swelling.  Gastrointestinal: Negative for abdominal pain.  Genitourinary: Negative for dysuria and enuresis.  Musculoskeletal: Negative for back pain.  Skin: Positive for wound.  Neurological: Negative for weakness and headaches.  Psychiatric/Behavioral: Negative for confusion.    Physical Exam Updated Vital Signs BP (!) 200/74   Pulse 64   Temp 98 F (36.7 C) (Oral)   Resp 18   Ht 5\' 4"  (1.626 m)   Wt 74.4 kg   SpO2 97%   BMI 28.15 kg/m   Physical Exam Vitals and nursing note reviewed.  HENT:     Head: Normocephalic.  Eyes:     Pupils: Pupils are equal, round, and reactive to light.  Cardiovascular:     Rate and  Rhythm: Regular rhythm.  Pulmonary:     Breath sounds: No wheezing or rhonchi.  Abdominal:     Tenderness: There is no abdominal tenderness.  Musculoskeletal:     Cervical back: Neck  supple.     Right lower leg: Edema present.     Left lower leg: Edema present.     Comments: Mild edema bilateral lower extremities with tenderness bilaterally.  Some tenderness laterally on left lower leg.  Scars anterior on the left lower leg from previous tissue removal.  Skin:    Capillary Refill: Capillary refill takes less than 2 seconds.     Findings: No bruising or erythema.  Neurological:     Mental Status: She is alert.     ED Results / Procedures / Treatments   Labs (all labs ordered are listed, but only abnormal results are displayed) Labs Reviewed - No data to display  EKG None  Radiology No results found.  Procedures Procedures (including critical care time)  Medications Ordered in ED Medications  atenolol (TENORMIN) tablet 50 mg (has no administration in time range)  losartan (COZAAR) tablet 100 mg (has no administration in time range)  torsemide (DEMADEX) tablet 10 mg (has no administration in time range)    ED Course  I have reviewed the triage vital signs and the nursing notes.  Pertinent labs & imaging results that were available during my care of the patient were reviewed by me and considered in my medical decision making (see chart for details).    MDM Rules/Calculators/A&P                      Patient presented with hypertension.  Had not been regular with her medications and took yesterday's dose in the afternoon instead of the morning.  Hypertensive now but no chest pain.  No trouble breathing.  Some chronic swelling on her legs but now with lateral swelling and pain.  Doppler done due to cancer history and previous surgery on that leg.  Negative.  Given oral medicines here.  Blood pressure improving and asymptomatic.  Can follow-up with PCP.  Discharge home. Final  Clinical Impression(s) / ED Diagnoses Final diagnoses:  None    Rx / DC Orders ED Discharge Orders    None       Davonna Belling, MD 03/18/19 1005

## 2019-04-11 ENCOUNTER — Other Ambulatory Visit: Payer: Self-pay | Admitting: *Deleted

## 2019-04-11 DIAGNOSIS — M79606 Pain in leg, unspecified: Secondary | ICD-10-CM

## 2019-04-15 ENCOUNTER — Other Ambulatory Visit: Payer: Self-pay

## 2019-04-15 ENCOUNTER — Encounter (HOSPITAL_COMMUNITY): Payer: Self-pay

## 2019-04-15 ENCOUNTER — Inpatient Hospital Stay (HOSPITAL_COMMUNITY): Payer: Medicare Other | Attending: Hematology

## 2019-04-15 DIAGNOSIS — Z853 Personal history of malignant neoplasm of breast: Secondary | ICD-10-CM | POA: Diagnosis not present

## 2019-04-15 DIAGNOSIS — C50512 Malignant neoplasm of lower-outer quadrant of left female breast: Secondary | ICD-10-CM

## 2019-04-15 LAB — CBC WITH DIFFERENTIAL/PLATELET
Abs Immature Granulocytes: 0.02 10*3/uL (ref 0.00–0.07)
Basophils Absolute: 0.1 10*3/uL (ref 0.0–0.1)
Basophils Relative: 1 %
Eosinophils Absolute: 0.1 10*3/uL (ref 0.0–0.5)
Eosinophils Relative: 2 %
HCT: 39.9 % (ref 36.0–46.0)
Hemoglobin: 12.7 g/dL (ref 12.0–15.0)
Immature Granulocytes: 0 %
Lymphocytes Relative: 28 %
Lymphs Abs: 1.6 10*3/uL (ref 0.7–4.0)
MCH: 29.3 pg (ref 26.0–34.0)
MCHC: 31.8 g/dL (ref 30.0–36.0)
MCV: 92.1 fL (ref 80.0–100.0)
Monocytes Absolute: 0.6 10*3/uL (ref 0.1–1.0)
Monocytes Relative: 10 %
Neutro Abs: 3.3 10*3/uL (ref 1.7–7.7)
Neutrophils Relative %: 59 %
Platelets: 213 10*3/uL (ref 150–400)
RBC: 4.33 MIL/uL (ref 3.87–5.11)
RDW: 13.3 % (ref 11.5–15.5)
WBC: 5.6 10*3/uL (ref 4.0–10.5)
nRBC: 0 % (ref 0.0–0.2)

## 2019-04-15 LAB — COMPREHENSIVE METABOLIC PANEL
ALT: 15 U/L (ref 0–44)
AST: 19 U/L (ref 15–41)
Albumin: 3.5 g/dL (ref 3.5–5.0)
Alkaline Phosphatase: 67 U/L (ref 38–126)
Anion gap: 9 (ref 5–15)
BUN: 16 mg/dL (ref 8–23)
CO2: 27 mmol/L (ref 22–32)
Calcium: 8.9 mg/dL (ref 8.9–10.3)
Chloride: 104 mmol/L (ref 98–111)
Creatinine, Ser: 0.78 mg/dL (ref 0.44–1.00)
GFR calc Af Amer: >60 mL/min (ref >60)
GFR calc non Af Amer: >60 mL/min (ref >60)
Glucose, Bld: 104 mg/dL — ABNORMAL HIGH (ref 70–99)
Potassium: 3.6 mmol/L (ref 3.5–5.1)
Sodium: 140 mmol/L (ref 135–145)
Total Bilirubin: 0.5 mg/dL (ref 0.3–1.2)
Total Protein: 6.5 g/dL (ref 6.5–8.1)

## 2019-04-15 MED ORDER — HEPARIN SOD (PORK) LOCK FLUSH 100 UNIT/ML IV SOLN
500.0000 [IU] | Freq: Once | INTRAVENOUS | Status: AC
  Administered 2019-04-15: 500 [IU] via INTRAVENOUS

## 2019-04-15 MED ORDER — SODIUM CHLORIDE 0.9% FLUSH
10.0000 mL | Freq: Once | INTRAVENOUS | Status: AC
  Administered 2019-04-15: 11:00:00 10 mL via INTRAVENOUS

## 2019-04-15 NOTE — Progress Notes (Signed)
Labs drawn from port.  Patients port flushed without difficulty.  Good blood return noted with no bruising or swelling noted at site.  Band aid applied.  VSS with discharge and left ambulatory with no s/s of distress noted.  

## 2019-04-16 ENCOUNTER — Other Ambulatory Visit: Payer: Self-pay

## 2019-04-16 ENCOUNTER — Ambulatory Visit (INDEPENDENT_AMBULATORY_CARE_PROVIDER_SITE_OTHER): Payer: Medicare Other | Admitting: Physician Assistant

## 2019-04-16 ENCOUNTER — Ambulatory Visit (HOSPITAL_COMMUNITY)
Admission: RE | Admit: 2019-04-16 | Discharge: 2019-04-16 | Disposition: A | Discharge status: Home / Self Care | Payer: Medicare Other | Source: Ambulatory Visit | Attending: Surgery | Admitting: Surgery

## 2019-04-16 VITALS — BP 165/84 | HR 65 | Temp 97.8°F | Resp 16 | Ht 64.0 in | Wt 162.1 lb

## 2019-04-16 DIAGNOSIS — M7989 Other specified soft tissue disorders: Secondary | ICD-10-CM

## 2019-04-16 DIAGNOSIS — M79606 Pain in leg, unspecified: Secondary | ICD-10-CM

## 2019-04-16 LAB — CANCER ANTIGEN 15-3: CA 15-3: 20.7 U/mL (ref 0.0–25.0)

## 2019-04-16 NOTE — Progress Notes (Signed)
VASCULAR & VEIN SPECIALISTS           OF [place]  History and Physical   Tiffany Sweeney is a 85 y.o. (November 12, 1934) female who presents with hx of triple negative left breast cancer and s/p left mastectomy.      She states that around the age of 45-22, she had phlebitis around her left ankle and she has had issues with leg swelling ever since.  She has not had any hx of DVT or non healing wounds.  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  She does have neuropathy of both feet with the left worse than the right due to chemotherapy for her breast cancer in 2015.  She states that her leg swelling gets better in the am after being in bed overnight.  She does take a fluid pill that she gets some relief from as well.  She has been wearing 20-29mmHg knee high stockings daily.  She does have some skin changes on her legs with the left worse than right.  She has not had any surgery in either groin, but does have hx right hip replacement and hysterectomy.  Her mother has hx of leg swelling.    She does her best to elevate her legs, but is unable to prop them up on the back of the sofa and she has been instructed to lay in the floor and prop up on the sofa, but she cannot do this either.  She states she has a recliner that gets her legs elevated above her heart that she is able to do.   She is from [place] and moved here 40 years ago after meeting her husband, who is from [place], [place].  She tells me she was in [place] during the [place] in 1985.   The pt is not on a statin for cholesterol management.  The pt is not on a daily aspirin.   Other AC:  none The pt is on BB, CCB, diuretic for hypertension.   The pt is not diabetic.   Tobacco hx:  Former-quit 1987  Past Medical History:  Diagnosis Date  . Anxiety   . Arthritis   . Breast cancer ([place])   . Cancer [facility])    breast- right  . Family history of colon cancer   . Family history of ovarian cancer     . Family history of pancreatic cancer   . High cholesterol   . Hypertension   . Skin cancer    Patient stated  she has low grade skin cancer in right hand    Past Surgical History:  Procedure Laterality Date  . ABDOMINAL HYSTERECTOMY    . CATARACT EXTRACTION W/PHACO Left 03/11/2013   Procedure: CATARACT EXTRACTION PHACO AND INTRAOCULAR LENS PLACEMENT (IOC);  Surgeon: Tonny Branch, MD;  Location: [facility];  Service: Ophthalmology;  Laterality: Left;  CDE:7.54  . CATARACT EXTRACTION W/PHACO Right 04/15/2013   Procedure: CATARACT EXTRACTION PHACO AND INTRAOCULAR LENS PLACEMENT (IOC);  Surgeon: Tonny Branch, MD;  Location: [facility];  Service: Ophthalmology;  Laterality: Right;  CDE 9.38  . MASTECTOMY Right   . MASTECTOMY MODIFIED RADICAL Left 11/04/2013   Procedure: LEFT MASTECTOMY MODIFIED RADICAL;  Surgeon: Jamesetta So, MD;  Location: [facility];  Service: General;  Laterality: Left;  . PORTACATH PLACEMENT Right 12/06/2013   Procedure: INSERTION PORT-A-CATH-Right subclavian;  Surgeon: Jamesetta So, MD;  Location: [facility];  Service: General;  Laterality: Right;  . TOTAL HIP ARTHROPLASTY Right 08/05/2015   Procedure: RIGHT TOTAL HIP ARTHROPLASTY ANTERIOR APPROACH;  Surgeon: Gaynelle Arabian, MD;  Location: [facility];  Service: Orthopedics;  Laterality: Right;    Social History   Socioeconomic History  . Marital status: Widowed    Spouse name: Not on file  . Number of children: 3  . Years of education: Not on file  . Highest education level: Not on file  Occupational History  . Not on file  Tobacco Use  . Smoking status: Former Smoker    Packs/day: 0.25    Years: 30.00    Pack years: 7.50    Types: Cigarettes    Start date: 10/10/1956    Quit date: 03/08/1985    Years since quitting: 34.1  . Smokeless tobacco: Never Used  . Tobacco comment: quit 25 years ago  Substance and Sexual Activity  . Alcohol use: No  . Drug use: No  . Sexual activity: Yes    Birth control/protection: Post-menopausal   Other Topics Concern  . Not on file  Social History Narrative  . Not on file   Social Determinants of Health   Financial Resource Strain:   . Difficulty of Paying Living Expenses:   Food Insecurity:   . Worried About Charity fundraiser in the Last Year:   . Arboriculturist in the Last Year:   Transportation Needs:   . Film/video editor (Medical):   Marland Kitchen Lack of Transportation (Non-Medical):   Physical Activity:   . Days of Exercise per Week:   . Minutes of Exercise per Session:   Stress:   . Feeling of Stress :   Social Connections:   . Frequency of Communication with Friends and Family:   . Frequency of Social Gatherings with Friends and Family:   . Attends Religious Services:   . Active Member of Clubs or Organizations:   . Attends Archivist Meetings:   Marland Kitchen Marital Status:   Intimate Partner Violence:   . Fear of Current or Ex-Partner:   . Emotionally Abused:   Marland Kitchen Physically Abused:   . Sexually Abused:      Family History  Problem Relation Age of Onset  . Diabetes Mother   . Rectal cancer Mother 39       died from complications of DM  . Dementia Father        Died at 77 in an accident  . Pancreatic cancer Brother 12  . Lung cancer Sister 71       "small oat cell"  . Ovarian cancer Sister 3  . Cancer Cousin        maternal 1st cousin with either ovarian or uterine  . Colon cancer Cousin        paternal first cousin dx at unknown age    Current Outpatient Medications  Medication Sig Dispense Refill  . amLODipine (NORVASC) 5 MG tablet     . atenolol (TENORMIN) 50 MG tablet Take 50 mg by mouth daily.    . cloNIDine (CATAPRES) 0.1 MG tablet Take 0.1 mg by mouth 2 (two) times daily.    Marland Kitchen LORazepam (ATIVAN) 0.5 MG tablet Take 0.5 mg by mouth every 8 (eight) hours as needed for anxiety.     Marland Kitchen losartan (COZAAR) 100 MG tablet      No current facility-administered medications for this visit.   Facility-Administered Medications Ordered in Other  Visits  Medication Dose Route Frequency Provider Last Rate Last  Admin  . sodium chloride 0.9 % injection 10 mL  10 mL Intravenous PRN Kurtis Bushman A, RN   10 mL at 08/26/14 1333  . sodium chloride flush (NS) 0.9 % injection 10 mL  10 mL Intravenous PRN Penland, Kelby Fam, MD   10 mL at 11/02/15 1117  . sodium chloride flush (NS) 0.9 % injection 10 mL  10 mL Intravenous PRN Holley Bouche, NP   10 mL at 08/16/16 1125    No Known Allergies  REVIEW OF SYSTEMS:   [X]  denotes positive finding, [ ]  denotes negative finding Cardiac  Comments:  High blood pressure x       Vascular    Pain in calf, thigh, or hip brought on by ambulation:    Blood clot in your veins:    Leg swelling:  x See HPI      Pulmonary    Oxygen at home:        Neurologic    Hx TIA x Seen on MRI  neuropathy x Drug induced from chemo      Gastrointestinal    Hx of GERD x       Hematology/Oncology    Hx of cancer x SCC, breast      Psychiatric    Hx anxiety x       Hematologic    Bleeding problems:        Skin    Rashes or ulcers: x Venous changes legs      Constitutional    Fever or chills:        Orthopedic    OA x Hx right hip replacement  DDD x     PHYSICAL EXAMINATION:  Today's Vitals   04/16/19 1408  BP: (!) 165/84  Pulse: 65  Resp: 16  Temp: 97.8 F (36.6 C)  SpO2: 98%  Weight: 162 lb 1.6 oz (73.5 kg)  Height: 5\' 4"  (1.626 m)   Body mass index is 27.82 kg/m.   General:  WDWN in NAD; vital signs documented above Gait: Not observed HENT: WNL, normocephalic Pulmonary: normal non-labored breathing , without Rales, rhonchi,  wheezing Cardiac: regular HR; without carotid bruits Abdomen: soft, NT, no masses Skin: hemosiderin changes BLE left >right Vascular Exam/Pulses:  Right Left  Radial 2+ (normal) 2+ (normal)  Ulnar Unable to palpate  Unable to palpate   Popliteal Unable to palpate  Unable to palpate   DP Biphasic doppler 2+ (normal)  PT Biphasic doppler  Biphasic doppler   Extremities:     Musculoskeletal: no muscle wasting or atrophy  Neurologic: A&O X 3;  No focal weakness or paresthesias are detected Psychiatric:  The pt has Normal affect.   Non-Invasive Vascular Imaging:   Venous duplex on 04/16/2019: +--------------+---------+------+-----------+------------+--------+  LEFT     Reflux NoRefluxReflux TimeDiameter cmsComments               Yes                   +--------------+---------+------+-----------+------------+--------+  CFV            yes  >1 second             +--------------+---------+------+-----------+------------+--------+  FV mid    no                         +--------------+---------+------+-----------+------------+--------+  Popliteal   no                         +--------------+---------+------+-----------+------------+--------+  GSV at [facility]        yes  >500 ms   0.80        +--------------+---------+------+-----------+------------+--------+  GSV prox thigh      yes  >500 ms   0.62        +--------------+---------+------+-----------+------------+--------+  GSV mid thigh       yes  >500 ms   0.72        +--------------+---------+------+-----------+------------+--------+  GSV dist thigh      yes  >500 ms   1.08        +--------------+---------+------+-----------+------------+--------+  GSV at knee        yes  >500 ms   0.72        +--------------+---------+------+-----------+------------+--------+  GSV prox calf       yes  >500 ms   0.67        +--------------+---------+------+-----------+------------+--------+  SSV Pop Fossa no               0.27        +--------------+---------+------+-----------+------------+--------+   Summary:  Left:  - No evidence of deep vein thrombosis seen in the left lower extremity.  - No evidence of superficial venous thrombosis in the left lower  extremity.  - No evidence of superficial venous reflux seen in the left short  saphenous vein.  - The common femoral vein is not competent.  - the great saphenous vein is not competent.    Tiffany Sweeney is a 85 y.o. female who presents with: BLE leg swelling with left > right  Pt does not have evidence of DVT.  She does have reflux in the GSV and the CFV with measurements of 0.62cm-1.08cm.  She has been wearing 20-46mmHg knee high compression stockings.  Discussed with pt that she will need to wear 20-60mmHg thigh high compression stockings for 3 months and return to see Dr. Oneida Alar or Dr. Scot Dock for evaluation for laser ablation.  She was fitted for compression today.  She is also given pamphlet on leg elevation.  Discussed swimming exercises as well if she has access to a pool.   Leontine Locket, [facility] Vascular [facility] Vein Specialists 04/16/2019 2:08 PM  Clinic MD:  Carlis Abbott

## 2019-07-15 ENCOUNTER — Encounter (HOSPITAL_COMMUNITY): Payer: Self-pay

## 2019-07-15 ENCOUNTER — Inpatient Hospital Stay (HOSPITAL_COMMUNITY): Payer: Medicare Other | Attending: Hematology

## 2019-07-15 ENCOUNTER — Other Ambulatory Visit: Payer: Self-pay

## 2019-07-15 DIAGNOSIS — Z853 Personal history of malignant neoplasm of breast: Secondary | ICD-10-CM | POA: Diagnosis present

## 2019-07-15 DIAGNOSIS — Z452 Encounter for adjustment and management of vascular access device: Secondary | ICD-10-CM | POA: Insufficient documentation

## 2019-07-15 MED ORDER — HEPARIN SOD (PORK) LOCK FLUSH 100 UNIT/ML IV SOLN
500.0000 [IU] | Freq: Once | INTRAVENOUS | Status: AC
  Administered 2019-07-15: 500 [IU] via INTRAVENOUS

## 2019-07-15 MED ORDER — SODIUM CHLORIDE 0.9% FLUSH
10.0000 mL | INTRAVENOUS | Status: DC | PRN
  Administered 2019-07-15: 10 mL via INTRAVENOUS

## 2019-07-23 DIAGNOSIS — I8393 Asymptomatic varicose veins of bilateral lower extremities: Secondary | ICD-10-CM

## 2019-07-24 ENCOUNTER — Ambulatory Visit: Payer: Medicare Other | Admitting: Vascular Surgery

## 2019-09-18 ENCOUNTER — Ambulatory Visit
Admission: EM | Admit: 2019-09-18 | Discharge: 2019-09-18 | Disposition: A | Discharge status: Home / Self Care | Payer: Medicare Other | Attending: Emergency Medicine | Admitting: Emergency Medicine

## 2019-09-18 ENCOUNTER — Other Ambulatory Visit: Payer: Self-pay

## 2019-09-18 DIAGNOSIS — L02212 Cutaneous abscess of back [any part, except buttock]: Secondary | ICD-10-CM

## 2019-09-18 MED ORDER — DOXYCYCLINE HYCLATE 100 MG PO CAPS: 100.0000 mg | ORAL_CAPSULE | Freq: Two times a day (BID) | ORAL | 0 refills | Status: DC

## 2019-09-18 NOTE — ED Triage Notes (Signed)
Pt has small area on back that she is concerned about, appears to be a black head

## 2019-09-18 NOTE — Discharge Instructions (Addendum)
Apply warm compresses 3-4x daily for 10-15 minutes Wash site daily with warm water and mild soap Take antibiotic as prescribed and to completion Follow up here or with PCP if symptoms persists Return or go to the ED if you have any new or worsening symptoms increased redness, swelling, pain, nausea, vomiting, fever, chills, etc...  

## 2019-09-18 NOTE — ED Provider Notes (Signed)
[place]   [id number] 09/18/19 Arrival Time: 1053   TK:[id number]  SUBJECTIVE:  Tiffany Sweeney is a 85 y.o. female with a complaint of abscess in her buttock for the past few days.  Denies any precipitating event.  Denies any drainage.  Has not tried any OTC medication.  Denies similar symptoms in the past.  Denies chills, fever, nausea, vomiting, diarrhea.  ROS: As per HPI.  All other pertinent ROS negative.     Past Medical History:  Diagnosis Date  . Anxiety   . Arthritis   . Breast cancer ([place])   . Cancer [facility])    breast- right  . Family history of colon cancer   . Family history of ovarian cancer   . Family history of pancreatic cancer   . High cholesterol   . Hypertension   . Skin cancer    Patient stated  she has low grade skin cancer in right hand   Past Surgical History:  Procedure Laterality Date  . ABDOMINAL HYSTERECTOMY    . CATARACT EXTRACTION W/PHACO Left 03/11/2013   Procedure: CATARACT EXTRACTION PHACO AND INTRAOCULAR LENS PLACEMENT (IOC);  Surgeon: Tonny Branch, MD;  Location: [facility];  Service: Ophthalmology;  Laterality: Left;  CDE:7.54  . CATARACT EXTRACTION W/PHACO Right 04/15/2013   Procedure: CATARACT EXTRACTION PHACO AND INTRAOCULAR LENS PLACEMENT (IOC);  Surgeon: Tonny Branch, MD;  Location: [facility];  Service: Ophthalmology;  Laterality: Right;  CDE 9.38  . MASTECTOMY Right   . MASTECTOMY MODIFIED RADICAL Left 11/04/2013   Procedure: LEFT MASTECTOMY MODIFIED RADICAL;  Surgeon: Jamesetta So, MD;  Location: [facility];  Service: General;  Laterality: Left;  . PORTACATH PLACEMENT Right 12/06/2013   Procedure: INSERTION PORT-A-CATH-Right subclavian;  Surgeon: Jamesetta So, MD;  Location: [facility];  Service: General;  Laterality: Right;  . TOTAL HIP ARTHROPLASTY Right 08/05/2015   Procedure: RIGHT TOTAL HIP ARTHROPLASTY ANTERIOR APPROACH;  Surgeon: Gaynelle Arabian, MD;  Location: [facility];  Service: Orthopedics;  Laterality: Right;   No Known  Allergies Current Facility-Administered Medications on File Prior to Encounter  Medication Dose Route Frequency Provider Last Rate Last Admin  . sodium chloride 0.9 % injection 10 mL  10 mL Intravenous PRN Kurtis Bushman A, RN   10 mL at 08/26/14 1333  . sodium chloride flush (NS) 0.9 % injection 10 mL  10 mL Intravenous PRN Penland, Kelby Fam, MD   10 mL at 11/02/15 1117  . sodium chloride flush (NS) 0.9 % injection 10 mL  10 mL Intravenous PRN Holley Bouche, NP   10 mL at 08/16/16 1125   Current Outpatient Medications on File Prior to Encounter  Medication Sig Dispense Refill  . amLODipine (NORVASC) 5 MG tablet     . atenolol (TENORMIN) 50 MG tablet Take 100 mg by mouth daily.     . cloNIDine (CATAPRES) 0.1 MG tablet Take 0.1 mg by mouth 2 (two) times daily.    Marland Kitchen LORazepam (ATIVAN) 0.5 MG tablet Take 0.5 mg by mouth every 8 (eight) hours as needed for anxiety.     Marland Kitchen losartan (COZAAR) 100 MG tablet     . torsemide (DEMADEX) 10 MG tablet Take 10 mg by mouth daily.     Social History   Socioeconomic History  . Marital status: Widowed    Spouse name: Not on file  . Number of children: 3  . Years of education: Not on file  . Highest education level: Not on file  Occupational History  .  Not on file  Tobacco Use  . Smoking status: Former Smoker    Packs/day: 0.25    Years: 30.00    Pack years: 7.50    Types: Cigarettes    Start date: 10/10/1956    Quit date: 03/08/1985    Years since quitting: 34.5  . Smokeless tobacco: Never Used  . Tobacco comment: quit 25 years ago  Vaping Use  . Vaping Use: Never used  Substance and Sexual Activity  . Alcohol use: No  . Drug use: No  . Sexual activity: Yes    Birth control/protection: Post-menopausal  Other Topics Concern  . Not on file  Social History Narrative  . Not on file   Social Determinants of Health   Financial Resource Strain:   . Difficulty of Paying Living Expenses: Not on file  Food Insecurity:   . Worried About  Charity fundraiser in the Last Year: Not on file  . Ran Out of Food in the Last Year: Not on file  Transportation Needs:   . Lack of Transportation (Medical): Not on file  . Lack of Transportation (Non-Medical): Not on file  Physical Activity:   . Days of Exercise per Week: Not on file  . Minutes of Exercise per Session: Not on file  Stress:   . Feeling of Stress : Not on file  Social Connections:   . Frequency of Communication with Friends and Family: Not on file  . Frequency of Social Gatherings with Friends and Family: Not on file  . Attends Religious Services: Not on file  . Active Member of Clubs or Organizations: Not on file  . Attends Archivist Meetings: Not on file  . Marital Status: Not on file  Intimate Partner Violence:   . Fear of Current or Ex-Partner: Not on file  . Emotionally Abused: Not on file  . Physically Abused: Not on file  . Sexually Abused: Not on file   Family History  Problem Relation Age of Onset  . Diabetes Mother   . Rectal cancer Mother 72       died from complications of DM  . Dementia Father        Died at 30 in an accident  . Pancreatic cancer Brother 53  . Lung cancer Sister 33       "small oat cell"  . Ovarian cancer Sister 37  . Cancer Cousin        maternal 1st cousin with either ovarian or uterine  . Colon cancer Cousin        paternal first cousin dx at unknown age    OBJECTIVE:  Vitals:   09/18/19 1120  BP: (!) 154/84  Pulse: (!) 59  Resp: 20  Temp: 98.6 F (37 C)  SpO2: 96%     General appearance: alert; no distress Chest: CTA, heart sounds normal Heart: RRR no murmur, rub or gallop Skin: 2 cm induration of her back; tender to touch; no active drainage Psychological: alert and cooperative; normal mood and affect    ASSESSMENT & PLAN:  1. Cutaneous abscess of back excluding buttocks     Meds ordered this encounter  Medications  . doxycycline (VIBRAMYCIN) 100 MG capsule    Sig: Take 1 capsule (100  mg total) by mouth 2 (two) times daily.    Dispense:  20 capsule    Refill:  0    Discharge instructions  Apply warm compresses 3-4x daily for 10-15 minutes Wash site daily with warm water  and mild soap Take antibiotic as prescribed and to completion Follow up here or with PCP if symptoms persists Return or go to the ED if you have any new or worsening symptoms increased redness, swelling, pain, nausea, vomiting, fever, chills, etc...    Reviewed expectations re: course of current medical issues. Questions answered. Outlined signs and symptoms indicating need for more acute intervention. Patient verbalized understanding. After Visit Summary given.       Note: This document was prepared using Dragon voice recognition software and may include unintentional dictation errors.    Emerson Monte, FNP 09/18/19 1203

## 2019-10-15 ENCOUNTER — Encounter (HOSPITAL_COMMUNITY): Payer: Self-pay

## 2019-10-15 ENCOUNTER — Other Ambulatory Visit: Payer: Self-pay

## 2019-10-15 ENCOUNTER — Inpatient Hospital Stay (HOSPITAL_COMMUNITY): Payer: Medicare Other | Attending: Hematology

## 2019-10-15 VITALS — BP 151/75 | HR 61 | Temp 96.9°F | Resp 17

## 2019-10-15 DIAGNOSIS — I10 Essential (primary) hypertension: Secondary | ICD-10-CM | POA: Diagnosis not present

## 2019-10-15 DIAGNOSIS — E7800 Pure hypercholesterolemia, unspecified: Secondary | ICD-10-CM

## 2019-10-15 DIAGNOSIS — M7989 Other specified soft tissue disorders: Secondary | ICD-10-CM | POA: Diagnosis not present

## 2019-10-15 DIAGNOSIS — Z79899 Other long term (current) drug therapy: Secondary | ICD-10-CM | POA: Diagnosis not present

## 2019-10-15 DIAGNOSIS — Z853 Personal history of malignant neoplasm of breast: Secondary | ICD-10-CM | POA: Insufficient documentation

## 2019-10-15 DIAGNOSIS — Z87891 Personal history of nicotine dependence: Secondary | ICD-10-CM | POA: Diagnosis not present

## 2019-10-15 DIAGNOSIS — Z800 Family history of malignant neoplasm of digestive organs: Secondary | ICD-10-CM | POA: Insufficient documentation

## 2019-10-15 DIAGNOSIS — Z801 Family history of malignant neoplasm of trachea, bronchus and lung: Secondary | ICD-10-CM | POA: Diagnosis not present

## 2019-10-15 DIAGNOSIS — Z9011 Acquired absence of right breast and nipple: Secondary | ICD-10-CM | POA: Diagnosis not present

## 2019-10-15 DIAGNOSIS — E876 Hypokalemia: Secondary | ICD-10-CM | POA: Diagnosis not present

## 2019-10-15 DIAGNOSIS — C50512 Malignant neoplasm of lower-outer quadrant of left female breast: Secondary | ICD-10-CM

## 2019-10-15 DIAGNOSIS — Z8041 Family history of malignant neoplasm of ovary: Secondary | ICD-10-CM | POA: Diagnosis not present

## 2019-10-15 DIAGNOSIS — I890 Lymphedema, not elsewhere classified: Secondary | ICD-10-CM | POA: Insufficient documentation

## 2019-10-15 LAB — CBC WITH DIFFERENTIAL/PLATELET
Abs Immature Granulocytes: 0.02 10*3/uL (ref 0.00–0.07)
Basophils Absolute: 0.1 10*3/uL (ref 0.0–0.1)
Basophils Relative: 1 %
Eosinophils Absolute: 0.2 10*3/uL (ref 0.0–0.5)
Eosinophils Relative: 3 %
HCT: 40.1 % (ref 36.0–46.0)
Hemoglobin: 13.1 g/dL (ref 12.0–15.0)
Immature Granulocytes: 0 %
Lymphocytes Relative: 25 %
Lymphs Abs: 1.3 10*3/uL (ref 0.7–4.0)
MCH: 29.6 pg (ref 26.0–34.0)
MCHC: 32.7 g/dL (ref 30.0–36.0)
MCV: 90.7 fL (ref 80.0–100.0)
Monocytes Absolute: 0.5 10*3/uL (ref 0.1–1.0)
Monocytes Relative: 9 %
Neutro Abs: 3.2 10*3/uL (ref 1.7–7.7)
Neutrophils Relative %: 62 %
Platelets: 209 10*3/uL (ref 150–400)
RBC: 4.42 MIL/uL (ref 3.87–5.11)
RDW: 13.1 % (ref 11.5–15.5)
WBC: 5.1 10*3/uL (ref 4.0–10.5)
nRBC: 0 % (ref 0.0–0.2)

## 2019-10-15 LAB — COMPREHENSIVE METABOLIC PANEL
ALT: 13 U/L (ref 0–44)
AST: 18 U/L (ref 15–41)
Albumin: 3.5 g/dL (ref 3.5–5.0)
Alkaline Phosphatase: 56 U/L (ref 38–126)
Anion gap: 9 (ref 5–15)
BUN: 16 mg/dL (ref 8–23)
CO2: 27 mmol/L (ref 22–32)
Calcium: 8.8 mg/dL — ABNORMAL LOW (ref 8.9–10.3)
Chloride: 105 mmol/L (ref 98–111)
Creatinine, Ser: 0.85 mg/dL (ref 0.44–1.00)
GFR, Estimated: >60 mL/min (ref >60)
Glucose, Bld: 154 mg/dL — ABNORMAL HIGH (ref 70–99)
Potassium: 3.2 mmol/L — ABNORMAL LOW (ref 3.5–5.1)
Sodium: 141 mmol/L (ref 135–145)
Total Bilirubin: 0.6 mg/dL (ref 0.3–1.2)
Total Protein: 6.3 g/dL — ABNORMAL LOW (ref 6.5–8.1)

## 2019-10-15 LAB — LIPID PANEL
Cholesterol: 237 mg/dL — ABNORMAL HIGH (ref 0–200)
HDL: 48 mg/dL (ref >40)
LDL Cholesterol: 157 mg/dL — ABNORMAL HIGH (ref 0–99)
Total CHOL/HDL Ratio: 4.9 RATIO
Triglycerides: 161 mg/dL — ABNORMAL HIGH (ref <150)
VLDL: 32 mg/dL (ref 0–40)

## 2019-10-15 LAB — TSH: TSH: 3.293 u[IU]/mL (ref 0.350–4.500)

## 2019-10-15 MED ORDER — SODIUM CHLORIDE 0.9% FLUSH
10.0000 mL | INTRAVENOUS | Status: DC | PRN
  Administered 2019-10-15: 10 mL via INTRAVENOUS

## 2019-10-15 MED ORDER — HEPARIN SOD (PORK) LOCK FLUSH 100 UNIT/ML IV SOLN
500.0000 [IU] | Freq: Once | INTRAVENOUS | Status: AC
  Administered 2019-10-15: 500 [IU] via INTRAVENOUS

## 2019-10-15 NOTE — Progress Notes (Signed)
Tiffany Sweeney presented for Portacath access and flush.  Portacath located right chest wall accessed with  H 20 needle.  Good blood return present. Portacath flushed with 63ml NS and 500U/74ml Heparin and needle removed intact.  Procedure tolerated well and without incident.  Discharged ambulatory in stable condition.

## 2019-10-16 LAB — CANCER ANTIGEN 15-3: CA 15-3: 18.6 U/mL (ref 0.0–25.0)

## 2019-10-22 ENCOUNTER — Ambulatory Visit (HOSPITAL_COMMUNITY): Payer: Medicare Other | Admitting: Hematology

## 2019-10-24 ENCOUNTER — Ambulatory Visit (HOSPITAL_COMMUNITY): Payer: Medicare Other | Admitting: Oncology

## 2019-10-24 ENCOUNTER — Other Ambulatory Visit: Payer: Self-pay

## 2019-10-24 ENCOUNTER — Inpatient Hospital Stay (HOSPITAL_BASED_OUTPATIENT_CLINIC_OR_DEPARTMENT_OTHER): Payer: Medicare Other | Admitting: Oncology

## 2019-10-24 VITALS — BP 131/69 | HR 60 | Temp 97.0°F | Resp 17 | Wt 163.3 lb

## 2019-10-24 DIAGNOSIS — Z171 Estrogen receptor negative status [ER-]: Secondary | ICD-10-CM

## 2019-10-24 DIAGNOSIS — I890 Lymphedema, not elsewhere classified: Secondary | ICD-10-CM | POA: Diagnosis not present

## 2019-10-24 DIAGNOSIS — C50512 Malignant neoplasm of lower-outer quadrant of left female breast: Secondary | ICD-10-CM

## 2019-10-24 DIAGNOSIS — Z853 Personal history of malignant neoplasm of breast: Secondary | ICD-10-CM | POA: Diagnosis not present

## 2019-10-24 MED ORDER — POTASSIUM CHLORIDE CRYS ER 20 MEQ PO TBCR: 20.0000 meq | EXTENDED_RELEASE_TABLET | Freq: Two times a day (BID) | ORAL | 0 refills | Status: DC

## 2019-10-24 NOTE — Assessment & Plan Note (Addendum)
1.  Stage I (T1CN0) triple negative left breast cancer: -Status post left mastectomy Lymph node dissection on 11/04/2013, 1.8 cm IDC, 0/9 lymph nodes positive. -Status post 3 cycles of TC in adjuvant setting, fourth cycle not given due to fatigue and neuropathy, completed on 02/03/2014. -Genetic testing negative in April 2016. - Bilateral mastectomy sites did not reveal any suspicious nodules.  No palpable adenopathy. - Her blood work including tumor marker is within normal limits.  CA 15-3: 18.6 (10/15/2019)       20.7 (04/15/2019)       19.9 (10/09/2018) - She will come back in 1 year with blood work and physical exam.  2.  Right breast cancer: - She has remote history of right breast cancer and underwent right mastectomy.  3.  Bilateral lymphedema: -She has lymphedema in both upper extremities, right more than left. -Referral to physical therapy-lymphedema clinic ordered.  Given her history of double mastectomy with lymph node dissection, she certainly would qualify for compression sleeves/devices to help with swelling and pain.  4.  Hypokalemia: -Likely secondary to torsemide due to lower extremity swelling. -Potassium level is 3.2 today. -Discussed potassium rich foods. -Recommend potassium supplementation 20 mEq twice daily x5 days.   Disposition: Referral placed for lymphedema clinic. RTC in 1 year for lab work (CA 15-3, CBC, CMP)

## 2019-10-24 NOTE — Progress Notes (Signed)
Tiffany Sweeney, [place] [id number]   CLINIC:  Medical Oncology/Hematology  PCP:  Lemmie Evens, MD [place] [address] [phone]   REASON FOR VISIT: Follow-up for breast cancer, ER-/PR-/HER2-  CURRENT THERAPY: Active surveillance.  BRIEF ONCOLOGIC HISTORY:  Oncology History  Breast cancer ([place])  07/25/2013 Imaging   Plain film of the lumbar spine, complete 4 view. Chronic lumbar degenerative change most prominent at L4-5. No acute abnormality and no change from prior study   10/21/2013 Initial Diagnosis   Left breast cancer, core biopsy, ER negative, PR negative, HER-2/neu negative, Ki-67 of 13%, infiltrating duct cell   11/04/2013 Surgery   Left modified radical mastectomy, 1.8 cm infiltrating duct cell carcinoma, ER negative, PR negative, HER-2/neu not overexpressed, 9 lymph nodes negative. Stage TI cN0 M0.   12/06/2013 Imaging   Portable chest x-ray showing no pneumothorax no lung edema or consolidation Port-A-Cath tip in the SVC.   12/23/2013 - 02/03/2014 Chemotherapy   Cycle 1 of TC initiated on 12/23/2013, patient completed 3 cycles of prescirbed therapy      CANCER STAGING: Cancer Staging Breast cancer ([place]) Staging form: Breast, AJCC 7th Edition - Clinical: Stage IA (T1a, N0, M0) - Unsigned    INTERVAL HISTORY:  Tiffany Sweeney 85 y.o. female seen for follow-up of triple negative left breast cancer.  Since her last visit, she was evaluated in the emergency room for hypertension and abscess of her back.  She was given antibiotics and told to follow-up with PCP.  She reports abscess on her back had to be removed by her PCP.  Reports it is healing well.  She was seen by dermatologist and had several spots removed from her left leg and arm that were precancerous.  Overall she reports feeling well.  She denies any fevers, night sweats or weight loss.  She has persistent leg swelling and wears compression stockings.   Symptoms have improved some.  She also complains of bilateral arm tenderness and swelling intermittently.  Weight and appetite are stable.   REVIEW OF SYSTEMS:  Review of Systems  Constitutional: Negative for appetite change, fatigue, fever and unexpected weight change.  HENT:   Negative for nosebleeds, sore throat and trouble swallowing.   Eyes: Negative.   Respiratory: Negative.  Negative for cough, shortness of breath and wheezing.   Cardiovascular: Positive for leg swelling. Negative for chest pain.  Gastrointestinal: Negative for abdominal pain, blood in stool, constipation, diarrhea, nausea and vomiting.  Endocrine: Negative.   Genitourinary: Negative.  Negative for bladder incontinence, hematuria and nocturia.   Musculoskeletal: Negative.  Negative for back pain and flank pain.  Skin: Negative.        Healing skin lesion on her back   Neurological: Negative.  Negative for dizziness, headaches, light-headedness and numbness.  Hematological: Negative.   Psychiatric/Behavioral: Negative.  Negative for confusion. The patient is not nervous/anxious.      PAST MEDICAL/SURGICAL HISTORY:  Past Medical History:  Diagnosis Date  . Anxiety   . Arthritis   . Breast cancer ([place])   . Cancer [facility])    breast- right  . Family history of colon cancer   . Family history of ovarian cancer   . Family history of pancreatic cancer   . High cholesterol   . Hypertension   . Skin cancer    Patient stated  she has low grade skin cancer in right hand   Past Surgical History:  Procedure Laterality Date  .  ABDOMINAL HYSTERECTOMY    . CATARACT EXTRACTION W/PHACO Left 03/11/2013   Procedure: CATARACT EXTRACTION PHACO AND INTRAOCULAR LENS PLACEMENT (IOC);  Surgeon: Tonny Branch, MD;  Location: [facility];  Service: Ophthalmology;  Laterality: Left;  CDE:7.54  . CATARACT EXTRACTION W/PHACO Right 04/15/2013   Procedure: CATARACT EXTRACTION PHACO AND INTRAOCULAR LENS PLACEMENT (IOC);  Surgeon: Tonny Branch, MD;   Location: [facility];  Service: Ophthalmology;  Laterality: Right;  CDE 9.38  . MASTECTOMY Right   . MASTECTOMY MODIFIED RADICAL Left 11/04/2013   Procedure: LEFT MASTECTOMY MODIFIED RADICAL;  Surgeon: Jamesetta So, MD;  Location: [facility]S;  Service: General;  Laterality: Left;  . PORTACATH PLACEMENT Right 12/06/2013   Procedure: INSERTION PORT-A-CATH-Right subclavian;  Surgeon: Jamesetta So, MD;  Location: [facility]S;  Service: General;  Laterality: Right;  . TOTAL HIP ARTHROPLASTY Right 08/05/2015   Procedure: RIGHT TOTAL HIP ARTHROPLASTY ANTERIOR APPROACH;  Surgeon: Gaynelle Arabian, MD;  Location: [facility];  Service: Orthopedics;  Laterality: Right;     SOCIAL HISTORY:  Social History   Socioeconomic History  . Marital status: Widowed    Spouse name: Not on file  . Number of children: 3  . Years of education: Not on file  . Highest education level: Not on file  Occupational History  . Not on file  Tobacco Use  . Smoking status: Former Smoker    Packs/day: 0.25    Years: 30.00    Pack years: 7.50    Types: Cigarettes    Start date: 10/10/1956    Quit date: 03/08/1985    Years since quitting: 34.6  . Smokeless tobacco: Never Used  . Tobacco comment: quit 25 years ago  Vaping Use  . Vaping Use: Never used  Substance and Sexual Activity  . Alcohol use: No  . Drug use: No  . Sexual activity: Yes    Birth control/protection: Post-menopausal  Other Topics Concern  . Not on file  Social History Narrative  . Not on file   Social Determinants of Health   Financial Resource Strain:   . Difficulty of Paying Living Expenses: Not on file  Food Insecurity:   . Worried About Charity fundraiser in the Last Year: Not on file  . Ran Out of Food in the Last Year: Not on file  Transportation Needs:   . Lack of Transportation (Medical): Not on file  . Lack of Transportation (Non-Medical): Not on file  Physical Activity:   . Days of Exercise per Week: Not on file  . Minutes of Exercise per  Session: Not on file  Stress:   . Feeling of Stress : Not on file  Social Connections:   . Frequency of Communication with Friends and Family: Not on file  . Frequency of Social Gatherings with Friends and Family: Not on file  . Attends Religious Services: Not on file  . Active Member of Clubs or Organizations: Not on file  . Attends Archivist Meetings: Not on file  . Marital Status: Not on file  Intimate Partner Violence:   . Fear of Current or Ex-Partner: Not on file  . Emotionally Abused: Not on file  . Physically Abused: Not on file  . Sexually Abused: Not on file    FAMILY HISTORY:  Family History  Problem Relation Age of Onset  . Diabetes Mother   . Rectal cancer Mother 68       died from complications of DM  . Dementia Father  Died at 62 in an accident  . Pancreatic cancer Brother 37  . Lung cancer Sister 63       "small oat cell"  . Ovarian cancer Sister 49  . Cancer Cousin        maternal 1st cousin with either ovarian or uterine  . Colon cancer Cousin        paternal first cousin dx at unknown age    CURRENT MEDICATIONS:  Outpatient Encounter Medications as of 10/24/2019  Medication Sig  . atenolol (TENORMIN) 50 MG tablet Take 100 mg by mouth daily.   . cloNIDine (CATAPRES) 0.1 MG tablet Take 0.1 mg by mouth 2 (two) times daily.  Marland Kitchen losartan (COZAAR) 100 MG tablet   . torsemide (DEMADEX) 10 MG tablet Take 10 mg by mouth daily.  Marland Kitchen LORazepam (ATIVAN) 0.5 MG tablet Take 0.5 mg by mouth every 8 (eight) hours as needed for anxiety.  (Patient not taking: Reported on 10/24/2019)  . potassium chloride SA (KLOR-CON) 20 MEQ tablet Take 1 tablet (20 mEq total) by mouth 2 (two) times daily.  . [DISCONTINUED] amLODipine (NORVASC) 5 MG tablet   . [DISCONTINUED] doxycycline (VIBRAMYCIN) 100 MG capsule Take 1 capsule (100 mg total) by mouth 2 (two) times daily.   Facility-Administered Encounter Medications as of 10/24/2019  Medication  . sodium chloride 0.9  % injection 10 mL  . sodium chloride flush (NS) 0.9 % injection 10 mL  . sodium chloride flush (NS) 0.9 % injection 10 mL    ALLERGIES:  No Known Allergies   PHYSICAL EXAM:  ECOG Performance status: 1  Vitals:   10/24/19 1308  BP: 131/69  Pulse: 60  Resp: 17  Temp: (!) 97 F (36.1 C)  SpO2: 98%   Filed Weights   10/24/19 1308  Weight: 163 lb 4.8 oz (74.1 kg)    Physical Exam Constitutional:      Appearance: She is well-developed.  Cardiovascular:     Rate and Rhythm: Normal rate and regular rhythm.     Heart sounds: Normal heart sounds.  Pulmonary:     Breath sounds: Normal breath sounds.  Abdominal:     General: There is no distension.     Palpations: Abdomen is soft. There is no mass.  Musculoskeletal:        General: Normal range of motion.  Skin:    General: Skin is warm and dry.  Neurological:     Mental Status: She is alert and oriented to person, place, and time.  Psychiatric:        Behavior: Behavior normal.        Thought Content: Thought content normal.        Judgment: Judgment normal.   Bilateral mastectomy sites are within normal limits.   LABORATORY DATA:  I have reviewed the labs as listed.  CBC    Component Value Date/Time   WBC 5.1 10/15/2019 1129   RBC 4.42 10/15/2019 1129   HGB 13.1 10/15/2019 1129   HGB 14.0 10/10/2013 1010   HGB 13.0 07/06/2006 1141   HCT 40.1 10/15/2019 1129   HCT 41.8 10/10/2013 1010   HCT 38.2 07/06/2006 1141   PLT 209 10/15/2019 1129   PLT 220 10/10/2013 1010   PLT 272 07/06/2006 1141   MCV 90.7 10/15/2019 1129   MCV 89 10/10/2013 1010   MCV 85.8 07/06/2006 1141   MCH 29.6 10/15/2019 1129   MCHC 32.7 10/15/2019 1129   RDW 13.1 10/15/2019 1129   RDW 13.3 10/10/2013 1010  RDW 11.2 (L) 07/06/2006 1141   LYMPHSABS 1.3 10/15/2019 1129   LYMPHSABS 2.0 10/10/2013 1010   LYMPHSABS 2.1 07/06/2006 1141   MONOABS 0.5 10/15/2019 1129   MONOABS 0.6 07/06/2006 1141   EOSABS 0.2 10/15/2019 1129   EOSABS 0.1  10/10/2013 1010   BASOSABS 0.1 10/15/2019 1129   BASOSABS 0.0 10/10/2013 1010   BASOSABS 0.1 07/06/2006 1141   CMP Latest Ref Rng & Units 10/15/2019 04/15/2019 10/09/2018  Glucose 70 - 99 mg/dL 154(H) 104(H) 116(H)  BUN 8 - 23 mg/dL 16 16 19   Creatinine 0.44 - 1.00 mg/dL 0.85 0.78 0.83  Sodium 135 - 145 mmol/L 141 140 140  Potassium 3.5 - 5.1 mmol/L 3.2(L) 3.6 3.4(L)  Chloride 98 - 111 mmol/L 105 104 105  CO2 22 - 32 mmol/L 27 27 27   Calcium 8.9 - 10.3 mg/dL 8.8(L) 8.9 8.7(L)  Total Protein 6.5 - 8.1 g/dL 6.3(L) 6.5 6.9  Total Bilirubin 0.3 - 1.2 mg/dL 0.6 0.5 0.8  Alkaline Phos 38 - 126 U/L 56 67 62  AST 15 - 41 U/L 18 19 20   ALT 0 - 44 U/L 13 15 15          ASSESSMENT & PLAN:   Breast cancer 1.  Stage I (T1CN0) triple negative left breast cancer: -Status post left mastectomy Lymph node dissection on 11/04/2013, 1.8 cm IDC, 0/9 lymph nodes positive. -Status post 3 cycles of TC in adjuvant setting, fourth cycle not given due to fatigue and neuropathy, completed on 02/03/2014. -Genetic testing negative in April 2016. - Bilateral mastectomy sites did not reveal any suspicious nodules.  No palpable adenopathy. - Her blood work including tumor marker is within normal limits.  CA 15-3: 18.6 (10/15/2019)       20.7 (04/15/2019)       19.9 (10/09/2018) - She will come back in 1 year with blood work and physical exam.  2.  Right breast cancer: - She has remote history of right breast cancer and underwent right mastectomy.  3.  Bilateral lymphedema: -She has lymphedema in both upper extremities, right more than left. -Referral to physical therapy-lymphedema clinic ordered.  Given her history of double mastectomy with lymph node dissection, she certainly would qualify for compression sleeves/devices to help with swelling and pain.  4.  Hypokalemia: -Likely secondary to torsemide due to lower extremity swelling. -Potassium level is 3.2 today. -Discussed potassium rich  foods. -Recommend potassium supplementation 20 mEq twice daily x5 days.   Disposition: Referral placed for lymphedema clinic. RTC in 1 year for lab work (CA 15-3, CBC, CMP)      Orders placed this encounter:  Orders Placed This Encounter  Procedures  . CBC with Differential/Platelet  . Comprehensive metabolic panel  . Cancer antigen 15-3  . Ambulatory referral to Physical Therapy   Greater than 50% was spent in counseling and coordination of care with this patient including but not limited to discussion of the relevant topics above (See A&P) including, but not limited to diagnosis and management of acute and chronic medical conditions.   Faythe Casa, NP 10/24/2019 3:55 PM

## 2019-12-09 ENCOUNTER — Ambulatory Visit (HOSPITAL_COMMUNITY): Payer: Medicare Other | Attending: Physical Therapy | Admitting: Physical Therapy

## 2019-12-11 ENCOUNTER — Ambulatory Visit (HOSPITAL_COMMUNITY): Payer: Medicare Other

## 2019-12-13 ENCOUNTER — Ambulatory Visit (HOSPITAL_COMMUNITY): Payer: Medicare Other | Admitting: Physical Therapy

## 2019-12-17 ENCOUNTER — Encounter (HOSPITAL_COMMUNITY): Payer: Medicare Other

## 2019-12-19 ENCOUNTER — Encounter (HOSPITAL_COMMUNITY): Payer: Medicare Other | Admitting: Physical Therapy

## 2019-12-20 ENCOUNTER — Ambulatory Visit (HOSPITAL_COMMUNITY): Payer: Medicare Other

## 2019-12-24 ENCOUNTER — Encounter (HOSPITAL_COMMUNITY): Payer: Medicare Other | Admitting: Physical Therapy

## 2019-12-26 ENCOUNTER — Encounter (HOSPITAL_COMMUNITY): Payer: Medicare Other

## 2019-12-30 ENCOUNTER — Telehealth (HOSPITAL_COMMUNITY): Payer: Self-pay | Admitting: Physical Therapy

## 2019-12-30 NOTE — Telephone Encounter (Signed)
She is out of town and will not return until Jan 11 - pt only wants Monday, Tuesday and Friday appointments; no Wed.

## 2019-12-31 ENCOUNTER — Encounter (HOSPITAL_COMMUNITY): Payer: Medicare Other

## 2020-01-02 ENCOUNTER — Encounter (HOSPITAL_COMMUNITY): Payer: Medicare Other

## 2020-01-06 ENCOUNTER — Ambulatory Visit (HOSPITAL_COMMUNITY): Payer: Medicare Other | Admitting: Physical Therapy

## 2020-01-08 ENCOUNTER — Encounter (HOSPITAL_COMMUNITY): Payer: Medicare Other | Admitting: Physical Therapy

## 2020-01-10 ENCOUNTER — Encounter (HOSPITAL_COMMUNITY): Payer: Medicare Other

## 2020-01-13 ENCOUNTER — Encounter (HOSPITAL_COMMUNITY): Payer: Medicare Other | Admitting: Physical Therapy

## 2020-01-14 ENCOUNTER — Ambulatory Visit (HOSPITAL_COMMUNITY): Payer: Medicare Other | Admitting: Physical Therapy

## 2020-01-15 ENCOUNTER — Encounter (HOSPITAL_COMMUNITY): Payer: Medicare Other | Admitting: Physical Therapy

## 2020-01-16 ENCOUNTER — Ambulatory Visit (HOSPITAL_COMMUNITY): Payer: Medicare Other | Attending: Oncology | Admitting: Physical Therapy

## 2020-01-16 ENCOUNTER — Other Ambulatory Visit: Payer: Self-pay

## 2020-01-16 ENCOUNTER — Encounter (HOSPITAL_COMMUNITY): Payer: Self-pay | Admitting: Physical Therapy

## 2020-01-16 DIAGNOSIS — I972 Postmastectomy lymphedema syndrome: Secondary | ICD-10-CM

## 2020-01-16 DIAGNOSIS — I890 Lymphedema, not elsewhere classified: Secondary | ICD-10-CM | POA: Insufficient documentation

## 2020-01-16 NOTE — Therapy (Signed)
[facility] [address] [place], [place], [address] Phone: [phone]   Fax:  [phone]  Physical Therapy Evaluation  Patient Details  Name: Tiffany Sweeney MRN: [id number] Date of Birth: 1934/04/20 Referring Provider (PT): Glenard Haring   Encounter Date: 01/16/2020   PT End of Session - 01/16/20 1633    Visit Number 1    Number of Visits 1    PT Start Time 0830    PT Stop Time 1000    PT Time Calculation (min) 90 min    Activity Tolerance Patient tolerated treatment well    Behavior During Therapy [facility] for tasks assessed/performed           Past Medical History:  Diagnosis Date  . Anxiety   . Arthritis   . Breast cancer ([place])   . Cancer [facility])    breast- right  . Family history of colon cancer   . Family history of ovarian cancer   . Family history of pancreatic cancer   . High cholesterol   . Hypertension   . Skin cancer    Patient stated  she has low grade skin cancer in right hand    Past Surgical History:  Procedure Laterality Date  . ABDOMINAL HYSTERECTOMY    . CATARACT EXTRACTION W/PHACO Left 03/11/2013   Procedure: CATARACT EXTRACTION PHACO AND INTRAOCULAR LENS PLACEMENT (IOC);  Surgeon: Tonny Branch, MD;  Location: [facility];  Service: Ophthalmology;  Laterality: Left;  CDE:7.54  . CATARACT EXTRACTION W/PHACO Right 04/15/2013   Procedure: CATARACT EXTRACTION PHACO AND INTRAOCULAR LENS PLACEMENT (IOC);  Surgeon: Tonny Branch, MD;  Location: [facility];  Service: Ophthalmology;  Laterality: Right;  CDE 9.38  . MASTECTOMY Right   . MASTECTOMY MODIFIED RADICAL Left 11/04/2013   Procedure: LEFT MASTECTOMY MODIFIED RADICAL;  Surgeon: Jamesetta So, MD;  Location: [facility];  Service: General;  Laterality: Left;  . PORTACATH PLACEMENT Right 12/06/2013   Procedure: INSERTION PORT-A-CATH-Right subclavian;  Surgeon: Jamesetta So, MD;  Location: [facility];  Service: General;  Laterality: Right;  . TOTAL HIP ARTHROPLASTY Right 08/05/2015    Procedure: RIGHT TOTAL HIP ARTHROPLASTY ANTERIOR APPROACH;  Surgeon: Gaynelle Arabian, MD;  Location: [facility];  Service: Orthopedics;  Laterality: Right;    There were no vitals filed for this visit.    Subjective Assessment - 01/16/20 0829    Subjective Tiffany Sweeney states that she has always had swelling in her legs, she was placed in compression garments which improved her leg considerably.  She does not have a pump.  She noted that sometimes she can not get her watch on and mentioned this to her MD who sent her here.  She states that it was so long ago and the swelling really doesn't bother her so she told her MD that she might not come but her MD convinced her to come for at least one visit.    Pertinent History Stage I triple negative carcinoma of the left breast s/p mastectomy and axillary LN disssection.  History of Carcinoma of the R breast    Patient Stated Goals to gain knowledge    Currently in Pain? No/denies              [facility] PT Assessment - 01/16/20 0001      Assessment   Medical Diagnosis B UE lymphedema    Referring Provider (PT) Junnifer Burns    Onset Date/Surgical Date --   Rt 1997; LT 2015   Next MD Visit  1 year ago    Prior Therapy none      Precautions   Precautions --   cellulits     Restrictions   Weight Bearing Restrictions No      Balance Screen   Has the patient fallen in the past 6 months No    Has the patient had a decrease in activity level because of a fear of falling?  No    Is the patient reluctant to leave their home because of a fear of falling?  No             LYMPHEDEMA/ONCOLOGY QUESTIONNAIRE - 01/16/20 0001      What other symptoms do you have   Are you Having Heaviness or Tightness No    Are you having Pain No    Are you having pitting edema --   sometimes   Is it Hard or Difficult finding clothes that fit Yes      Lymphedema Stage   Stage STAGE 1 SPONTANEOUSLY REVERSIBLE      Lymphedema Assessments   Lymphedema Assessments  Upper extremities      Right Upper Extremity Lymphedema   15 cm Proximal to Olecranon Process 33.8 cm    10 cm Proximal to Olecranon Process 32.7 cm    Olecranon Process 38.7 cm    15 cm Proximal to Ulnar Styloid Process 27 cm    10 cm Proximal to Ulnar Styloid Process 24.8 cm    Just Proximal to Ulnar Styloid Process 19.3 cm      Left Upper Extremity Lymphedema   15 cm Proximal to Olecranon Process 32 cm    10 cm Proximal to Olecranon Process 30.8 cm    Olecranon Process 27.5 cm    15 cm Proximal to Ulnar Styloid Process 25.5 cm    10 cm Proximal to Ulnar Styloid Process 23.3 cm    Just Proximal to Ulnar Styloid Process 17.3 cm                   Objective measurements completed on examination: See above findings.       [facility] Adult PT Treatment/Exercise - 01/16/20 0001      Exercises   Exercises Knee/Hip      Knee/Hip Exercises: Seated   Long Arc Quad Both;10 reps    Other Seated Knee/Hip Exercises ankle pumps    Other Seated Knee/Hip Exercises B shoulder flexion, abduction, IR/ER, elbow flexion, extension and diaphragmic breathing    Marching Both;10 reps    Abduction/Adduction  Both;10 reps                    PT Short Term Goals - 01/16/20 1644      PT SHORT TERM GOAL #1   Title Pt to be I in HEP    Time 1    Period Days    Status Achieved    Target Date 01/16/20                     Plan - 01/16/20 1633    Clinical Impression Statement Tiffany Sweeney is an 86 yo female who has been referred to skilled PT for B UE lymphedema following B masectomies in the remote past.  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 Despite wearing compression garments on her LE for years and even sleeping in them she continues to have increased edema. The pt states that she knows her arms are swollen but  they do not bother her to much except that she can no longer wear her watch, however she has only been experiencing UE lymphedema for a  year where she has had LE lymphedema for over 50 years.  .  She was hesitant to come to therapy but her oncologist wanted her to come to see what can be done to keep the swelling from increasing.   The therapist explained what lymphedema is and why she has it in all four extremities, that lymphedema can be controlled but is unable to be cured.  She was shown both lower and Upper extremity exercises and shown a Butler to assist in donning her LE compression garments.  UE was measured both for edema as well as for sizing for compression sleeve; the pt will be an average medium in size.  The therapist also showed and explained what a compression pump is, due to the fact that she has lymphedema in all of her extremities the therapist feels that this will be beneficial and she could pump her arms in the morning and her legs in the evening.  No manual decongestive techniques are recommended at this time.    Personal Factors and Comorbidities Comorbidity 3+;Fitness;Time since onset of injury/illness/exacerbation    Comorbidities R and Lt radical masectomy;    Examination-Activity Limitations Locomotion Level;Dressing    Examination-Participation Restrictions Community Activity    Stability/Clinical Decision Making Evolving/Moderate complexity    Clinical Decision Making Moderate    Rehab Potential Good    PT Frequency 1x / week    PT Duration --   1 week   PT Treatment/Interventions ADLs/Self Care Home Management;Patient/family education;Therapeutic exercise    PT Next Visit Plan Discharge    PT Home Exercise Plan UE and LE    Consulted and Agree with Plan of Care Patient           Patient will benefit from skilled therapeutic intervention in order to improve the following deficits and impairments:  Increased edema,Decreased skin integrity  Visit Diagnosis: Lymphedema, not elsewhere classified  Postmastectomy lymphedema     Problem List Patient Active Problem List   Diagnosis Date Noted  .  Health care maintenance 11/02/2015  . Blood glucose elevated 11/02/2015  . OA (osteoarthritis) of hip 08/05/2015  . Genetic testing 04/29/2014  . Family history of ovarian cancer   . Family history of colon cancer   . Family history of pancreatic cancer   . Breast cancer ([place]) 11/04/2013  . Low back pain 11/12/2012    Rayetta Humphrey, PT CLT [phone] 01/16/2020, 4:47 PM  [facility] [address] [place], [place], [address] Phone: [phone]   Fax:  [phone]  Name: Lakai Trevillion MRN: [id number] Date of Birth: 03-25-1934

## 2020-01-17 ENCOUNTER — Encounter (HOSPITAL_COMMUNITY): Payer: Medicare Other

## 2020-01-20 ENCOUNTER — Encounter (HOSPITAL_COMMUNITY): Payer: Medicare Other | Admitting: Physical Therapy

## 2020-01-21 ENCOUNTER — Encounter (HOSPITAL_COMMUNITY): Payer: Medicare Other | Admitting: Physical Therapy

## 2020-01-22 ENCOUNTER — Encounter (HOSPITAL_COMMUNITY): Payer: Medicare Other | Admitting: Physical Therapy

## 2020-01-24 ENCOUNTER — Encounter (HOSPITAL_COMMUNITY): Payer: Medicare Other

## 2020-01-27 ENCOUNTER — Encounter (HOSPITAL_COMMUNITY): Payer: Medicare Other | Admitting: Physical Therapy

## 2020-01-28 ENCOUNTER — Encounter (HOSPITAL_COMMUNITY): Payer: Medicare Other | Admitting: Physical Therapy

## 2020-01-29 ENCOUNTER — Encounter (HOSPITAL_COMMUNITY): Payer: Medicare Other | Admitting: Physical Therapy

## 2020-01-31 ENCOUNTER — Encounter (HOSPITAL_COMMUNITY): Payer: Medicare Other

## 2020-02-03 ENCOUNTER — Encounter (HOSPITAL_COMMUNITY): Payer: Medicare Other | Admitting: Physical Therapy

## 2020-02-04 ENCOUNTER — Encounter (HOSPITAL_COMMUNITY): Payer: Medicare Other | Admitting: Physical Therapy

## 2020-02-07 ENCOUNTER — Encounter (HOSPITAL_COMMUNITY): Payer: Medicare Other | Admitting: Physical Therapy

## 2020-02-11 ENCOUNTER — Encounter (HOSPITAL_COMMUNITY): Payer: Medicare Other | Admitting: Physical Therapy

## 2020-02-14 ENCOUNTER — Encounter (HOSPITAL_COMMUNITY): Payer: Medicare Other

## 2020-02-17 ENCOUNTER — Encounter (HOSPITAL_COMMUNITY): Payer: Medicare Other | Admitting: Physical Therapy

## 2020-02-18 ENCOUNTER — Encounter (HOSPITAL_COMMUNITY): Payer: Medicare Other | Admitting: Physical Therapy

## 2020-02-21 ENCOUNTER — Encounter (HOSPITAL_COMMUNITY): Payer: Medicare Other | Admitting: Physical Therapy

## 2020-02-25 ENCOUNTER — Encounter (HOSPITAL_COMMUNITY): Payer: Medicare Other | Admitting: Physical Therapy

## 2020-02-28 ENCOUNTER — Encounter (HOSPITAL_COMMUNITY): Payer: Medicare Other

## 2020-03-05 ENCOUNTER — Other Ambulatory Visit: Payer: Self-pay

## 2020-03-05 ENCOUNTER — Emergency Department (HOSPITAL_COMMUNITY)
Admission: EM | Admit: 2020-03-05 | Discharge: 2020-03-05 | Disposition: A | Discharge status: Home / Self Care | Payer: Medicare Other | Attending: Emergency Medicine | Admitting: Emergency Medicine

## 2020-03-05 ENCOUNTER — Emergency Department (HOSPITAL_COMMUNITY): Payer: Medicare Other

## 2020-03-05 ENCOUNTER — Encounter (HOSPITAL_COMMUNITY): Payer: Self-pay | Admitting: Emergency Medicine

## 2020-03-05 DIAGNOSIS — Z79899 Other long term (current) drug therapy: Secondary | ICD-10-CM | POA: Insufficient documentation

## 2020-03-05 DIAGNOSIS — Z87891 Personal history of nicotine dependence: Secondary | ICD-10-CM | POA: Diagnosis not present

## 2020-03-05 DIAGNOSIS — Z96641 Presence of right artificial hip joint: Secondary | ICD-10-CM | POA: Diagnosis not present

## 2020-03-05 DIAGNOSIS — Z85828 Personal history of other malignant neoplasm of skin: Secondary | ICD-10-CM | POA: Diagnosis not present

## 2020-03-05 DIAGNOSIS — Z853 Personal history of malignant neoplasm of breast: Secondary | ICD-10-CM | POA: Diagnosis not present

## 2020-03-05 DIAGNOSIS — I10 Essential (primary) hypertension: Secondary | ICD-10-CM | POA: Diagnosis not present

## 2020-03-05 DIAGNOSIS — R202 Paresthesia of skin: Secondary | ICD-10-CM | POA: Diagnosis present

## 2020-03-05 LAB — BASIC METABOLIC PANEL
Anion gap: 9 (ref 5–15)
BUN: 14 mg/dL (ref 8–23)
CO2: 25 mmol/L (ref 22–32)
Calcium: 8.2 mg/dL — ABNORMAL LOW (ref 8.9–10.3)
Chloride: 105 mmol/L (ref 98–111)
Creatinine, Ser: 0.74 mg/dL (ref 0.44–1.00)
GFR, Estimated: >60 mL/min (ref >60)
Glucose, Bld: 124 mg/dL — ABNORMAL HIGH (ref 70–99)
Potassium: 3.5 mmol/L (ref 3.5–5.1)
Sodium: 139 mmol/L (ref 135–145)

## 2020-03-05 LAB — CBC WITH DIFFERENTIAL/PLATELET
Abs Immature Granulocytes: 0.02 10*3/uL (ref 0.00–0.07)
Basophils Absolute: 0 10*3/uL (ref 0.0–0.1)
Basophils Relative: 1 %
Eosinophils Absolute: 0.1 10*3/uL (ref 0.0–0.5)
Eosinophils Relative: 1 %
HCT: 39.7 % (ref 36.0–46.0)
Hemoglobin: 12.9 g/dL (ref 12.0–15.0)
Immature Granulocytes: 0 %
Lymphocytes Relative: 23 %
Lymphs Abs: 1.2 10*3/uL (ref 0.7–4.0)
MCH: 29.7 pg (ref 26.0–34.0)
MCHC: 32.5 g/dL (ref 30.0–36.0)
MCV: 91.5 fL (ref 80.0–100.0)
Monocytes Absolute: 0.4 10*3/uL (ref 0.1–1.0)
Monocytes Relative: 7 %
Neutro Abs: 3.4 10*3/uL (ref 1.7–7.7)
Neutrophils Relative %: 68 %
Platelets: 184 10*3/uL (ref 150–400)
RBC: 4.34 MIL/uL (ref 3.87–5.11)
RDW: 13.2 % (ref 11.5–15.5)
WBC: 5 10*3/uL (ref 4.0–10.5)
nRBC: 0 % (ref 0.0–0.2)

## 2020-03-05 LAB — MAGNESIUM: Magnesium: 1.9 mg/dL (ref 1.7–2.4)

## 2020-03-05 MED ORDER — HEPARIN SOD (PORK) LOCK FLUSH 100 UNIT/ML IV SOLN
500.0000 [IU] | Freq: Once | INTRAVENOUS | Status: AC
  Administered 2020-03-05: 500 [IU]
  Filled 2020-03-05: qty 5

## 2020-03-05 NOTE — ED Triage Notes (Addendum)
Pt states she woke up at 2:00 this morning with left arm "sensation" and left finger numbness. Pt stated it subsided and in the [place], pt stated having same sensation up the left side of neck and head. Stated the sensation in her head has been intermittent for the last week.   BP 216/103. Took her clonidine 0.1 mg this morning.

## 2020-03-05 NOTE — ED Notes (Signed)
Pt to ct at this time.

## 2020-03-05 NOTE — Discharge Instructions (Addendum)
You were seen in the emergency department for on and off tingling on the left side of your head for a week and new or tingling on your left hand that occurred today.  You had blood work EKG and a CAT scan of your head that did not show any obvious explanation for your symptoms.  Please contact your primary care doctor for close follow-up.  Return to the emergency department if any worsening or concerning symptoms.

## 2020-03-05 NOTE — ED Provider Notes (Signed)
[facility] EMERGENCY DEPARTMENT Provider Note   CSN: [id number] Arrival date & time: 03/05/20  4098     History Chief Complaint  Patient presents with  . Hypertension    Tiffany Sweeney is a 86 y.o. female.  She has a history of hypertension and remote history of breast cancer.  For about 1 week she has had some intermittent tingling on the left side of her head.  It last for a minute at a time.  She thought her vision was a little worse than it should be and saw the eye doctor who told her everything was fine with her eyes.  At 2 AM she woke up with tingling dorsum of her wrist hand.  Her blood pressure was elevated.  Her symptoms resolved but she again had some tingling in her head this morning.  No double vision no numbness no weakness no difficulty with speech.  No difficulty with balance or coordination.  She took her morning blood pressure medications.   Hypertension This is a chronic problem. The problem has been resolved. Pertinent negatives include no chest pain, no abdominal pain, no headaches and no shortness of breath. Nothing aggravates the symptoms. Nothing relieves the symptoms. She has tried nothing for the symptoms. The treatment provided no relief.  Cerebrovascular Accident This is a new problem. The current episode started more than 1 week ago. The problem occurs daily. The problem has not changed since onset.Pertinent negatives include no chest pain, no abdominal pain, no headaches and no shortness of breath. Nothing aggravates the symptoms. Nothing relieves the symptoms. She has tried nothing for the symptoms. The treatment provided no relief.       Past Medical History:  Diagnosis Date  . Anxiety   . Arthritis   . Breast cancer ([place])   . Cancer [facility])    breast- right  . Family history of colon cancer   . Family history of ovarian cancer   . Family history of pancreatic cancer   . High cholesterol   . Hypertension   . Skin cancer    Patient stated  she has  low grade skin cancer in right hand    Patient Active Problem List   Diagnosis Date Noted  . Health care maintenance 11/02/2015  . Blood glucose elevated 11/02/2015  . OA (osteoarthritis) of hip 08/05/2015  . Genetic testing 04/29/2014  . Family history of ovarian cancer   . Family history of colon cancer   . Family history of pancreatic cancer   . Breast cancer ([place]) 11/04/2013  . Low back pain 11/12/2012    Past Surgical History:  Procedure Laterality Date  . ABDOMINAL HYSTERECTOMY    . CATARACT EXTRACTION W/PHACO Left 03/11/2013   Procedure: CATARACT EXTRACTION PHACO AND INTRAOCULAR LENS PLACEMENT (IOC);  Surgeon: Tonny Branch, MD;  Location: [facility];  Service: Ophthalmology;  Laterality: Left;  CDE:7.54  . CATARACT EXTRACTION W/PHACO Right 04/15/2013   Procedure: CATARACT EXTRACTION PHACO AND INTRAOCULAR LENS PLACEMENT (IOC);  Surgeon: Tonny Branch, MD;  Location: [facility];  Service: Ophthalmology;  Laterality: Right;  CDE 9.38  . MASTECTOMY Right   . MASTECTOMY MODIFIED RADICAL Left 11/04/2013   Procedure: LEFT MASTECTOMY MODIFIED RADICAL;  Surgeon: Jamesetta So, MD;  Location: [facility];  Service: General;  Laterality: Left;  . PORTACATH PLACEMENT Right 12/06/2013   Procedure: INSERTION PORT-A-CATH-Right subclavian;  Surgeon: Jamesetta So, MD;  Location: [facility];  Service: General;  Laterality: Right;  . TOTAL HIP ARTHROPLASTY Right 08/05/2015  Procedure: RIGHT TOTAL HIP ARTHROPLASTY ANTERIOR APPROACH;  Surgeon: Gaynelle Arabian, MD;  Location: [facility];  Service: Orthopedics;  Laterality: Right;     OB History    Gravida  4   Para  3   Term  3   Preterm      AB  1   Living  2     SAB  1   IAB      Ectopic      Multiple      Live Births              Family History  Problem Relation Age of Onset  . Diabetes Mother   . Rectal cancer Mother 23       died from complications of DM  . Dementia Father        Died at 24 in an accident  . Pancreatic cancer Brother 49   . Lung cancer Sister 30       "small oat cell"  . Ovarian cancer Sister 63  . Cancer Cousin        maternal 1st cousin with either ovarian or uterine  . Colon cancer Cousin        paternal first cousin dx at unknown age    Social History   Tobacco Use  . Smoking status: Former Smoker    Packs/day: 0.25    Years: 30.00    Pack years: 7.50    Types: Cigarettes    Start date: 10/10/1956    Quit date: 03/08/1985    Years since quitting: 35.0  . Smokeless tobacco: Never Used  . Tobacco comment: quit 25 years ago  Vaping Use  . Vaping Use: Never used  Substance Use Topics  . Alcohol use: No  . Drug use: No    Home Medications Prior to Admission medications   Medication Sig Start Date End Date Taking? Authorizing Provider  atenolol (TENORMIN) 50 MG tablet Take 100 mg by mouth daily.     [provider]  cloNIDine (CATAPRES) 0.1 MG tablet Take 0.1 mg by mouth 2 (two) times daily. 03/19/19   [provider]  LORazepam (ATIVAN) 0.5 MG tablet Take 0.5 mg by mouth every 8 (eight) hours as needed for anxiety.  Patient not taking: Reported on 10/24/2019 07/11/17   [provider]  losartan (COZAAR) 100 MG tablet  08/20/18   [provider]  potassium chloride SA (KLOR-CON) 20 MEQ tablet Take 1 tablet (20 mEq total) by mouth 2 (two) times daily. 10/24/19   Jacquelin Hawking, NP  torsemide (DEMADEX) 10 MG tablet Take 10 mg by mouth daily.    [provider]    Allergies    Patient has no known allergies.  Review of Systems   Review of Systems  Constitutional: Negative for fever.  HENT: Negative for sore throat.   Eyes: Positive for visual disturbance. Negative for pain.  Respiratory: Negative for shortness of breath.   Cardiovascular: Negative for chest pain.  Gastrointestinal: Negative for abdominal pain.  Genitourinary: Negative for dysuria.  Musculoskeletal: Negative for neck pain.  Skin: Negative for rash.  Neurological: Positive for  numbness (tingling). Negative for facial asymmetry, speech difficulty, weakness and headaches.    Physical Exam Updated Vital Signs BP (!) 172/77   Pulse 62   Temp 98.1 F (36.7 C) (Oral)   Resp 17   Ht 5\' 4"  (1.626 m)   Wt 73.9 kg   SpO2 98%   BMI 27.98 kg/m  Physical Exam Vitals and nursing note reviewed.  Constitutional:      General: She is not in acute distress.    Appearance: Normal appearance. She is well-developed and well-nourished.  HENT:     Head: Normocephalic and atraumatic.  Eyes:     Conjunctiva/sclera: Conjunctivae normal.  Cardiovascular:     Rate and Rhythm: Normal rate and regular rhythm.     Heart sounds: No murmur heard.   Pulmonary:     Effort: Pulmonary effort is normal. No respiratory distress.     Breath sounds: Normal breath sounds.  Abdominal:     Palpations: Abdomen is soft.     Tenderness: There is no abdominal tenderness.  Musculoskeletal:        General: No edema.     Cervical back: Neck supple.  Skin:    General: Skin is warm and dry.     Capillary Refill: Capillary refill takes less than 2 seconds.  Neurological:     General: No focal deficit present.     Mental Status: She is alert and oriented to person, place, and time.     Cranial Nerves: No cranial nerve deficit.     Sensory: No sensory deficit.     Motor: No weakness.     Coordination: Coordination normal.     Gait: Gait normal.     Comments: She currently is not having any neurologic symptoms.  No cranial nerve deficits and tongue midline.  Strength 5 out of 5 upper and lower extremities.  Normal proprioception.  Normal finger-to-nose.  Normal gait.  Psychiatric:        Mood and Affect: Mood and affect normal.     ED Results / Procedures / Treatments   Labs (all labs ordered are listed, but only abnormal results are displayed) Labs Reviewed  BASIC METABOLIC PANEL - Abnormal; Notable for the following components:      Result Value   Glucose, Bld 124 (*)    Calcium  8.2 (*)    All other components within normal limits  CBC WITH DIFFERENTIAL/PLATELET  MAGNESIUM    EKG EKG Interpretation  Date/Time:  Thursday March 05 2020 08:08:08 EST Ventricular Rate:  66 PR Interval:    QRS Duration: 104 QT Interval:  489 QTC Calculation: 513 R Axis:   -12 Text Interpretation: Sinus rhythm Ventricular premature complex Probable left ventricular hypertrophy Anterior Q waves, possibly due to LVH Prolonged QT interval Confirmed by Aletta Edouard [phone]) on 03/05/2020 8:15:16 AM   Radiology CT Head Wo Contrast  Result Date: 03/05/2020 CLINICAL DATA:  Left head and hand tingling sensations and left-sided headache and numbness since this morning. Associated hypertension. EXAM: CT HEAD WITHOUT CONTRAST TECHNIQUE: Contiguous axial images were obtained from the base of the skull through the vertex without intravenous contrast. COMPARISON:  09/23/2018 FINDINGS: Brain: Normal appearing cerebral hemispheres and posterior fossa structures. Normal size and position of the ventricles. No intracranial hemorrhage, mass lesion or CT evidence of acute infarction. Vascular: No hyperdense vessel or unexpected calcification. Skull: Mild bilateral hyperostosis frontalis. Sinuses/Orbits: Status post bilateral cataract extraction. Unremarkable bones and included paranasal sinuses. Other: None. IMPRESSION: Normal examination. Electronically Signed   By: Claudie Revering M.D.   On: 03/05/2020 09:54    Procedures Procedures   Medications Ordered in ED Medications  heparin lock flush 100 unit/mL (500 Units Intracatheter Given 03/05/20 1020)    ED Course  I have reviewed the triage vital signs and the nursing notes.  Pertinent labs & imaging results that  were available during my care of the patient were reviewed by me and considered in my medical decision making (see chart for details).  Clinical Course as of 03/05/20 2017  Thu Mar 05, 2020  0920 Radiology is having network problems and  images are not crossing into epic to be reviewed.  Pulmonary reading by Dr. Thornton Papas radiology of the CT head is no acute intracranial abnormalities [MB]  0936 Work-up did not show any significant abnormalities.  Symptoms are very atypical coming and going over minutes.  Recommended close follow-up with her PCP.  Blood pressure now back to her normal range at 153/76.  Return instructions discussed [MB]    Clinical Course User Index [MB] Hayden Rasmussen, MD   MDM Rules/Calculators/A&P                         This patient complains of intermittent tingling of the left posterior scalp and one episode of tingling on the dorsum of the left hand along with elevated blood pressure; this involves an extensive number of treatment Options and is a complaint that carries with it a high risk of complications and Morbidity. The differential includes hypertensive urgency, stroke, TIA, paresthesias, metabolic derangement  I ordered, reviewed and interpreted labs, which included CBC with normal white count normal hemoglobin, chemistries normal other than mildly elevated glucose and low calcium, magnesium normal I ordered imaging studies which included CT head and I independently    visualized and interpreted imaging which showed no acute findings  Previous records obtained and reviewed in epic, no recent admissions  After the interventions stated above, I reevaluated the patient and found patient continues to be asymptomatic here in the department.  Blood pressure improved.  At this time do not feel needs admission for further work-up.  Return instructions discussed.  Recommended close follow-up with PCP.   Final Clinical Impression(s) / ED Diagnoses Final diagnoses:  Paresthesia  Primary hypertension    Rx / DC Orders ED Discharge Orders    None       Hayden Rasmussen, MD 03/05/20 2025

## 2020-03-05 NOTE — ED Notes (Signed)
ED Provider at bedside. 

## 2020-03-07 ENCOUNTER — Emergency Department (HOSPITAL_COMMUNITY)
Admission: EM | Admit: 2020-03-07 | Discharge: 2020-03-07 | Disposition: A | Discharge status: Home / Self Care | Payer: Medicare Other | Attending: Emergency Medicine | Admitting: Emergency Medicine

## 2020-03-07 ENCOUNTER — Encounter (HOSPITAL_COMMUNITY): Payer: Self-pay | Admitting: Emergency Medicine

## 2020-03-07 ENCOUNTER — Other Ambulatory Visit: Payer: Self-pay

## 2020-03-07 DIAGNOSIS — Z96641 Presence of right artificial hip joint: Secondary | ICD-10-CM | POA: Insufficient documentation

## 2020-03-07 DIAGNOSIS — R030 Elevated blood-pressure reading, without diagnosis of hypertension: Secondary | ICD-10-CM | POA: Diagnosis present

## 2020-03-07 DIAGNOSIS — Z87891 Personal history of nicotine dependence: Secondary | ICD-10-CM | POA: Insufficient documentation

## 2020-03-07 DIAGNOSIS — I10 Essential (primary) hypertension: Secondary | ICD-10-CM

## 2020-03-07 DIAGNOSIS — Z853 Personal history of malignant neoplasm of breast: Secondary | ICD-10-CM | POA: Insufficient documentation

## 2020-03-07 DIAGNOSIS — Z79899 Other long term (current) drug therapy: Secondary | ICD-10-CM | POA: Insufficient documentation

## 2020-03-07 NOTE — ED Provider Notes (Signed)
[facility] EMERGENCY DEPARTMENT Provider Note   CSN: [id number] Arrival date & time: 03/07/20  4098     History Chief Complaint  Patient presents with  . Hypertension    Tiffany Sweeney is a 86 y.o. female.  HPI    86 yo female ho hypertension seen 2 days ago for paresthesias and hypertension presents today with elevated bp.  She states she has not had any return of the paresthesias, numbness, or weakness.  She took her blood pressure this morning as per her usual.  It was elevated to 220.  She took 0.1 of clonidine which she has been told to take if her blood pressures over 220.  She did not take her usual a.m. medications.  Prior to my evaluation blood pressure was 1 74/73 and patient without symptoms.  She denies headache, head injury, vision changes, chest pain, dyspnea, nausea, vomiting, diarrhea.  She normally takes atenolol and losartan in the morning.  She only takes the clonidine as needed and is only taken couple times in the past.  She did not take her normal a.m. medications as she thought she should not mix them with clonidine. Past Medical History:  Diagnosis Date  . Anxiety   . Arthritis   . Breast cancer ([place])   . Cancer [facility])    breast- right  . Family history of colon cancer   . Family history of ovarian cancer   . Family history of pancreatic cancer   . High cholesterol   . Hypertension   . Skin cancer    Patient stated  she has low grade skin cancer in right hand    Patient Active Problem List   Diagnosis Date Noted  . Health care maintenance 11/02/2015  . Blood glucose elevated 11/02/2015  . OA (osteoarthritis) of hip 08/05/2015  . Genetic testing 04/29/2014  . Family history of ovarian cancer   . Family history of colon cancer   . Family history of pancreatic cancer   . Breast cancer ([place]) 11/04/2013  . Low back pain 11/12/2012    Past Surgical History:  Procedure Laterality Date  . ABDOMINAL HYSTERECTOMY    . CATARACT EXTRACTION W/PHACO Left  03/11/2013   Procedure: CATARACT EXTRACTION PHACO AND INTRAOCULAR LENS PLACEMENT (IOC);  Surgeon: Tonny Branch, MD;  Location: [facility];  Service: Ophthalmology;  Laterality: Left;  CDE:7.54  . CATARACT EXTRACTION W/PHACO Right 04/15/2013   Procedure: CATARACT EXTRACTION PHACO AND INTRAOCULAR LENS PLACEMENT (IOC);  Surgeon: Tonny Branch, MD;  Location: [facility];  Service: Ophthalmology;  Laterality: Right;  CDE 9.38  . MASTECTOMY Right   . MASTECTOMY MODIFIED RADICAL Left 11/04/2013   Procedure: LEFT MASTECTOMY MODIFIED RADICAL;  Surgeon: Jamesetta So, MD;  Location: [facility];  Service: General;  Laterality: Left;  . PORTACATH PLACEMENT Right 12/06/2013   Procedure: INSERTION PORT-A-CATH-Right subclavian;  Surgeon: Jamesetta So, MD;  Location: [facility];  Service: General;  Laterality: Right;  . TOTAL HIP ARTHROPLASTY Right 08/05/2015   Procedure: RIGHT TOTAL HIP ARTHROPLASTY ANTERIOR APPROACH;  Surgeon: Gaynelle Arabian, MD;  Location: [facility];  Service: Orthopedics;  Laterality: Right;     OB History    Gravida  4   Para  3   Term  3   Preterm      AB  1   Living  2     SAB  1   IAB      Ectopic      Multiple      Live  Births              Family History  Problem Relation Age of Onset  . Diabetes Mother   . Rectal cancer Mother 67       died from complications of DM  . Dementia Father        Died at 4 in an accident  . Pancreatic cancer Brother 64  . Lung cancer Sister 106       "small oat cell"  . Ovarian cancer Sister 24  . Cancer Cousin        maternal 1st cousin with either ovarian or uterine  . Colon cancer Cousin        paternal first cousin dx at unknown age    Social History   Tobacco Use  . Smoking status: Former Smoker    Packs/day: 0.25    Years: 30.00    Pack years: 7.50    Types: Cigarettes    Start date: 10/10/1956    Quit date: 03/08/1985    Years since quitting: 35.0  . Smokeless tobacco: Never Used  . Tobacco comment: quit 25 years ago  Vaping Use   . Vaping Use: Never used  Substance Use Topics  . Alcohol use: No  . Drug use: No    Home Medications Prior to Admission medications   Medication Sig Start Date End Date Taking? Authorizing Provider  atenolol (TENORMIN) 50 MG tablet Take 100 mg by mouth daily.     [provider]  cloNIDine (CATAPRES) 0.1 MG tablet Take 0.1 mg by mouth 2 (two) times daily. 03/19/19   [provider]  LORazepam (ATIVAN) 0.5 MG tablet Take 0.5 mg by mouth every 8 (eight) hours as needed for anxiety.  Patient not taking: No sig reported 07/11/17   [provider]  losartan (COZAAR) 100 MG tablet  08/20/18   [provider]  potassium chloride SA (KLOR-CON) 20 MEQ tablet Take 1 tablet (20 mEq total) by mouth 2 (two) times daily. Patient not taking: No sig reported 10/24/19   Jacquelin Hawking, NP  torsemide (DEMADEX) 10 MG tablet Take 10 mg by mouth daily. Patient not taking: No sig reported    [provider]    Allergies    Patient has no known allergies.  Review of Systems   Review of Systems  All other systems reviewed and are negative.   Physical Exam Updated Vital Signs BP (!) 174/73   Pulse 65   Temp 98.5 F (36.9 C) (Oral)   Ht 1.626 m (5\' 4" )   Wt 73.9 kg   SpO2 97%   BMI 27.98 kg/m   Physical Exam Vitals and nursing note reviewed.  Constitutional:      Appearance: Normal appearance.  HENT:     Head: Normocephalic.     Right Ear: External ear normal.     Left Ear: External ear normal.     Nose: Nose normal.     Mouth/Throat:     Pharynx: Oropharynx is clear.  Eyes:     Extraocular Movements: Extraocular movements intact.     Pupils: Pupils are equal, round, and reactive to light.  Cardiovascular:     Rate and Rhythm: Normal rate and regular rhythm.     Pulses: Normal pulses.     Heart sounds: Normal heart sounds.  Pulmonary:     Effort: Pulmonary effort is normal.  Abdominal:     General: Abdomen is flat.  Musculoskeletal:  General: Normal range of motion.     Cervical back: Normal range of motion.  Skin:    General: Skin is warm.  Neurological:     General: No focal deficit present.     Mental Status: She is alert.     Cranial Nerves: No cranial nerve deficit.     Sensory: No sensory deficit.     Motor: No weakness.     Coordination: Coordination normal.     Deep Tendon Reflexes: Reflexes normal.  Psychiatric:        Mood and Affect: Mood normal.        Behavior: Behavior normal.     ED Results / Procedures / Treatments   Labs (all labs ordered are listed, but only abnormal results are displayed) Labs Reviewed - No data to display  EKG None  Radiology CT Head Wo Contrast  Result Date: 03/05/2020 CLINICAL DATA:  Left head and hand tingling sensations and left-sided headache and numbness since this morning. Associated hypertension. EXAM: CT HEAD WITHOUT CONTRAST TECHNIQUE: Contiguous axial images were obtained from the base of the skull through the vertex without intravenous contrast. COMPARISON:  09/23/2018 FINDINGS: Brain: Normal appearing cerebral hemispheres and posterior fossa structures. Normal size and position of the ventricles. No intracranial hemorrhage, mass lesion or CT evidence of acute infarction. Vascular: No hyperdense vessel or unexpected calcification. Skull: Mild bilateral hyperostosis frontalis. Sinuses/Orbits: Status post bilateral cataract extraction. Unremarkable bones and included paranasal sinuses. Other: None. IMPRESSION: Normal examination. Electronically Signed   By: Claudie Revering M.D.   On: 03/05/2020 09:54    Procedures Procedures   Medications Ordered in ED Medications - No data to display  ED Course  I have reviewed the triage vital signs and the nursing notes.  Pertinent labs & imaging results that were available during my care of the patient were reviewed by me and considered in my medical decision making (see chart for details).    MDM  Rules/Calculators/A&P                          Patient with known hypertension presents today with asymptomatic hypertension.  She took her clonidine prior to arrival and blood pressure was 174/73 which has previously been recorded for her blood pressure.  She did not take her usual a.m. medications.  She is given that is here in the ED.  Plan observation for the next 30 minutes to make sure the blood pressure is decreasing rather than increasing.  Does not feel any further testing is indicated at this time as the patient has a known history and is on medications.  She has no complaints of any focal deficits, or other symptoms consistent with urgent hypertension such as chest pain or dyspnea.  I suspect this is an isolated a.m. reading at the defervescent's of her medication levels.  I have discussed that she should be taking her daily medications regularly and should use the clonidine in addition.  She voices understanding.  10:47 AM BP 144/78 patient remains stable and appears stable for d/c Final Clinical Impression(s) / ED Diagnoses Final diagnoses:  Hypertension, unspecified type    Rx / DC Orders ED Discharge Orders    None       Pattricia Boss, MD 03/07/20 1047

## 2020-03-07 NOTE — ED Triage Notes (Signed)
Pt here for high blood pressure. States it was 223/110 at home. Pt is supposed to take Losartan 100mg  and atenolol 50mg  in the morning but did not take it this morning. Pt did take Clonidine 0.1mg  @ 0830 this morning prior to arrival. Pt denies any other symptoms.

## 2020-03-07 NOTE — Discharge Instructions (Signed)
Please take your usual blood pressure medications at the same time each day.

## 2020-10-19 ENCOUNTER — Inpatient Hospital Stay (HOSPITAL_COMMUNITY): Payer: Medicare Other

## 2020-10-19 ENCOUNTER — Inpatient Hospital Stay (HOSPITAL_COMMUNITY): Payer: Medicare Other | Attending: Hematology

## 2020-10-19 VITALS — BP 193/72 | HR 65 | Temp 97.7°F | Resp 20

## 2020-10-19 DIAGNOSIS — C50512 Malignant neoplasm of lower-outer quadrant of left female breast: Secondary | ICD-10-CM

## 2020-10-19 DIAGNOSIS — I10 Essential (primary) hypertension: Secondary | ICD-10-CM | POA: Insufficient documentation

## 2020-10-19 DIAGNOSIS — I890 Lymphedema, not elsewhere classified: Secondary | ICD-10-CM | POA: Insufficient documentation

## 2020-10-19 DIAGNOSIS — Z853 Personal history of malignant neoplasm of breast: Secondary | ICD-10-CM | POA: Diagnosis present

## 2020-10-19 DIAGNOSIS — Z9013 Acquired absence of bilateral breasts and nipples: Secondary | ICD-10-CM | POA: Diagnosis not present

## 2020-10-19 LAB — CBC WITH DIFFERENTIAL/PLATELET
Abs Immature Granulocytes: 0.01 10*3/uL (ref 0.00–0.07)
Basophils Absolute: 0.1 10*3/uL (ref 0.0–0.1)
Basophils Relative: 1 %
Eosinophils Absolute: 0.1 10*3/uL (ref 0.0–0.5)
Eosinophils Relative: 3 %
HCT: 42.3 % (ref 36.0–46.0)
Hemoglobin: 13.7 g/dL (ref 12.0–15.0)
Immature Granulocytes: 0 %
Lymphocytes Relative: 26 %
Lymphs Abs: 1.5 10*3/uL (ref 0.7–4.0)
MCH: 29.7 pg (ref 26.0–34.0)
MCHC: 32.4 g/dL (ref 30.0–36.0)
MCV: 91.6 fL (ref 80.0–100.0)
Monocytes Absolute: 0.4 10*3/uL (ref 0.1–1.0)
Monocytes Relative: 8 %
Neutro Abs: 3.5 10*3/uL (ref 1.7–7.7)
Neutrophils Relative %: 62 %
Platelets: 208 10*3/uL (ref 150–400)
RBC: 4.62 MIL/uL (ref 3.87–5.11)
RDW: 13.1 % (ref 11.5–15.5)
WBC: 5.6 10*3/uL (ref 4.0–10.5)
nRBC: 0 % (ref 0.0–0.2)

## 2020-10-19 LAB — LIPID PANEL
Cholesterol: 240 mg/dL — ABNORMAL HIGH (ref 0–200)
HDL: 47 mg/dL (ref >40)
LDL Cholesterol: 165 mg/dL — ABNORMAL HIGH (ref 0–99)
Total CHOL/HDL Ratio: 5.1 RATIO
Triglycerides: 139 mg/dL (ref <150)
VLDL: 28 mg/dL (ref 0–40)

## 2020-10-19 LAB — COMPREHENSIVE METABOLIC PANEL
ALT: 14 U/L (ref 0–44)
AST: 16 U/L (ref 15–41)
Albumin: 3.7 g/dL (ref 3.5–5.0)
Alkaline Phosphatase: 67 U/L (ref 38–126)
Anion gap: 6 (ref 5–15)
BUN: 13 mg/dL (ref 8–23)
CO2: 29 mmol/L (ref 22–32)
Calcium: 8.8 mg/dL — ABNORMAL LOW (ref 8.9–10.3)
Chloride: 106 mmol/L (ref 98–111)
Creatinine, Ser: 0.71 mg/dL (ref 0.44–1.00)
GFR, Estimated: >60 mL/min (ref >60)
Glucose, Bld: 105 mg/dL — ABNORMAL HIGH (ref 70–99)
Potassium: 3.4 mmol/L — ABNORMAL LOW (ref 3.5–5.1)
Sodium: 141 mmol/L (ref 135–145)
Total Bilirubin: 0.5 mg/dL (ref 0.3–1.2)
Total Protein: 6.6 g/dL (ref 6.5–8.1)

## 2020-10-19 NOTE — Patient Instructions (Signed)
[facility]  Discharge Instructions: °Thank you for choosing [facility] to provide your oncology and hematology care.  °If you have a lab appointment with the Cancer Center, please come in thru the Main Entrance and check in at the main information desk. ° °Wear comfortable clothing and clothing appropriate for easy access to any Portacath or PICC line.  ° °We strive to give you quality time with your provider. You may need to reschedule your appointment if you arrive late (15 or more minutes).  Arriving late affects you and other patients whose appointments are after yours.  Also, if you miss three or more appointments without notifying the office, you may be dismissed from the clinic at the provider’s discretion.    °  °For prescription refill requests, have your pharmacy contact our office and allow 72 hours for refills to be completed.   ° °Today you received the following chemotherapy and/or immunotherapy agents PORT flush    °  °To help prevent nausea and vomiting after your treatment, we encourage you to take your nausea medication as directed. ° °BELOW ARE SYMPTOMS THAT SHOULD BE REPORTED IMMEDIATELY: °*FEVER GREATER THAN 100.4 F (38 °C) OR HIGHER °*CHILLS OR SWEATING °*NAUSEA AND VOMITING THAT IS NOT CONTROLLED WITH YOUR NAUSEA MEDICATION °*UNUSUAL SHORTNESS OF BREATH °*UNUSUAL BRUISING OR BLEEDING °*URINARY PROBLEMS (pain or burning when urinating, or frequent urination) °*BOWEL PROBLEMS (unusual diarrhea, constipation, pain near the anus) °TENDERNESS IN MOUTH AND THROAT WITH OR WITHOUT PRESENCE OF ULCERS (sore throat, sores in mouth, or a toothache) °UNUSUAL RASH, SWELLING OR PAIN  °UNUSUAL VAGINAL DISCHARGE OR ITCHING  ° °Items with * indicate a potential emergency and should be followed up as soon as possible or go to the Emergency Department if any problems should occur. ° °Please show the CHEMOTHERAPY ALERT CARD or IMMUNOTHERAPY ALERT CARD at check-in to the Emergency  Department and triage nurse. ° °Should you have questions after your visit or need to cancel or reschedule your appointment, please contact  [facility]ANC[facility] [phone]  and follow the prompts.  Office hours are 8:00 a.m. to 4:30 p.m. Monday - Friday. Please note that voicemails left after 4:00 p.m. may not be returned until the following business day.  We are closed weekends and major holidays. You have access to a nurse at all times for urgent questions. Please call the main number to the clinic [phone] and follow the prompts. ° °For any non-urgent questions, you may also contact your provider using MyChart. We now offer e-Visits for anyone 18 and older to request care online for non-urgent symptoms. For details visit [email or web address]. °  °Also download the MyChart app! Go to the app store, search "MyChart", open the app, select [facility], and log in with your MyChart username and password. ° °Due to Covid, a mask is required upon entering the hospital/clinic. If you do not have a mask, one will be given to you upon arrival. For doctor visits, patients may have 1 support person aged 18 or older with them. For treatment visits, patients cannot have anyone with them due to current Covid guidelines and our immunocompromised population.  °

## 2020-10-19 NOTE — Progress Notes (Signed)
Patients port flushed without difficulty.  Good blood return noted with no bruising or swelling noted at site.  Stable during access and blood draw.  Band aid applied.  VSS with discharge and left in satisfactory condition with no s/s of distress noted.   ?

## 2020-10-21 ENCOUNTER — Emergency Department (HOSPITAL_COMMUNITY): Payer: Medicare Other

## 2020-10-21 ENCOUNTER — Other Ambulatory Visit: Payer: Self-pay

## 2020-10-21 ENCOUNTER — Inpatient Hospital Stay (HOSPITAL_COMMUNITY): Payer: Medicare Other

## 2020-10-21 ENCOUNTER — Observation Stay (HOSPITAL_COMMUNITY)
Admission: EM | Admit: 2020-10-21 | Discharge: 2020-10-22 | Disposition: A | Discharge status: Home / Self Care | Payer: Medicare Other | Attending: Cardiology | Admitting: Cardiology

## 2020-10-21 ENCOUNTER — Encounter (HOSPITAL_COMMUNITY): Payer: Self-pay

## 2020-10-21 ENCOUNTER — Encounter (HOSPITAL_COMMUNITY)
Admission: EM | Disposition: A | Discharge status: Home / Self Care | Payer: Self-pay | Source: Home / Self Care | Attending: Emergency Medicine

## 2020-10-21 DIAGNOSIS — Z87891 Personal history of nicotine dependence: Secondary | ICD-10-CM | POA: Diagnosis not present

## 2020-10-21 DIAGNOSIS — E876 Hypokalemia: Secondary | ICD-10-CM | POA: Insufficient documentation

## 2020-10-21 DIAGNOSIS — Z96641 Presence of right artificial hip joint: Secondary | ICD-10-CM | POA: Insufficient documentation

## 2020-10-21 DIAGNOSIS — Z853 Personal history of malignant neoplasm of breast: Secondary | ICD-10-CM | POA: Diagnosis not present

## 2020-10-21 DIAGNOSIS — Z85828 Personal history of other malignant neoplasm of skin: Secondary | ICD-10-CM | POA: Diagnosis not present

## 2020-10-21 DIAGNOSIS — Z20822 Contact with and (suspected) exposure to covid-19: Secondary | ICD-10-CM | POA: Insufficient documentation

## 2020-10-21 DIAGNOSIS — I10 Essential (primary) hypertension: Secondary | ICD-10-CM | POA: Insufficient documentation

## 2020-10-21 DIAGNOSIS — I214 Non-ST elevation (NSTEMI) myocardial infarction: Secondary | ICD-10-CM | POA: Diagnosis present

## 2020-10-21 DIAGNOSIS — R0602 Shortness of breath: Secondary | ICD-10-CM | POA: Diagnosis present

## 2020-10-21 DIAGNOSIS — Z79899 Other long term (current) drug therapy: Secondary | ICD-10-CM | POA: Insufficient documentation

## 2020-10-21 DIAGNOSIS — I2510 Atherosclerotic heart disease of native coronary artery without angina pectoris: Secondary | ICD-10-CM | POA: Insufficient documentation

## 2020-10-21 HISTORY — DX: Hyperlipidemia, unspecified: E78.5

## 2020-10-21 HISTORY — DX: Essential (primary) hypertension: I10

## 2020-10-21 HISTORY — PX: LEFT HEART CATH AND CORONARY ANGIOGRAPHY: CATH118249

## 2020-10-21 LAB — COMPREHENSIVE METABOLIC PANEL WITH GFR
ALT: 16 U/L (ref 0–44)
AST: 29 U/L (ref 15–41)
Albumin: 3.3 g/dL — ABNORMAL LOW (ref 3.5–5.0)
Alkaline Phosphatase: 65 U/L (ref 38–126)
Anion gap: 8 (ref 5–15)
BUN: 14 mg/dL (ref 8–23)
CO2: 25 mmol/L (ref 22–32)
Calcium: 8.3 mg/dL — ABNORMAL LOW (ref 8.9–10.3)
Chloride: 105 mmol/L (ref 98–111)
Creatinine, Ser: 0.81 mg/dL (ref 0.44–1.00)
GFR, Estimated: >60 mL/min
Glucose, Bld: 144 mg/dL — ABNORMAL HIGH (ref 70–99)
Potassium: 3.1 mmol/L — ABNORMAL LOW (ref 3.5–5.1)
Sodium: 138 mmol/L (ref 135–145)
Total Bilirubin: 0.9 mg/dL (ref 0.3–1.2)
Total Protein: 6.1 g/dL — ABNORMAL LOW (ref 6.5–8.1)

## 2020-10-21 LAB — CBC WITH DIFFERENTIAL/PLATELET
Abs Immature Granulocytes: 0.02 10*3/uL (ref 0.00–0.07)
Basophils Absolute: 0 10*3/uL (ref 0.0–0.1)
Basophils Relative: 1 %
Eosinophils Absolute: 0.1 10*3/uL (ref 0.0–0.5)
Eosinophils Relative: 2 %
HCT: 42 % (ref 36.0–46.0)
Hemoglobin: 13.5 g/dL (ref 12.0–15.0)
Immature Granulocytes: 0 %
Lymphocytes Relative: 18 %
Lymphs Abs: 1.4 10*3/uL (ref 0.7–4.0)
MCH: 29.6 pg (ref 26.0–34.0)
MCHC: 32.1 g/dL (ref 30.0–36.0)
MCV: 92.1 fL (ref 80.0–100.0)
Monocytes Absolute: 0.7 10*3/uL (ref 0.1–1.0)
Monocytes Relative: 9 %
Neutro Abs: 5.4 10*3/uL (ref 1.7–7.7)
Neutrophils Relative %: 70 %
Platelets: 186 10*3/uL (ref 150–400)
RBC: 4.56 MIL/uL (ref 3.87–5.11)
RDW: 13.2 % (ref 11.5–15.5)
WBC: 7.7 10*3/uL (ref 4.0–10.5)
nRBC: 0 % (ref 0.0–0.2)

## 2020-10-21 LAB — ECHOCARDIOGRAM COMPLETE
Area-P 1/2: 2.2 cm2
Height: 64 in
S' Lateral: 3.3 cm
Weight: 2640 oz

## 2020-10-21 LAB — RESP PANEL BY RT-PCR (FLU A&B, COVID) ARPGX2
Influenza A by PCR: NEGATIVE
Influenza B by PCR: NEGATIVE
SARS Coronavirus 2 by RT PCR: NEGATIVE

## 2020-10-21 LAB — TROPONIN I (HIGH SENSITIVITY)
Troponin I (High Sensitivity): 2728 ng/L (ref <18)
Troponin I (High Sensitivity): 5300 ng/L (ref <18)

## 2020-10-21 LAB — CANCER ANTIGEN 15-3: CA 15-3: 23.5 U/mL (ref 0.0–25.0)

## 2020-10-21 SURGERY — LEFT HEART CATH AND CORONARY ANGIOGRAPHY
Anesthesia: LOCAL

## 2020-10-21 MED ORDER — ASPIRIN 81 MG PO CHEW
81.0000 mg | CHEWABLE_TABLET | Freq: Every day | ORAL | Status: DC
  Administered 2020-10-22: 81 mg via ORAL
  Filled 2020-10-21: qty 1

## 2020-10-21 MED ORDER — ROSUVASTATIN CALCIUM 20 MG PO TABS: 40.0000 mg | ORAL_TABLET | Freq: Every day | ORAL | Status: DC

## 2020-10-21 MED ORDER — ASPIRIN 325 MG PO TABS
325.0000 mg | ORAL_TABLET | Freq: Once | ORAL | Status: AC
  Administered 2020-10-21: 325 mg via ORAL
  Filled 2020-10-21: qty 1

## 2020-10-21 MED ORDER — POTASSIUM CHLORIDE CRYS ER 20 MEQ PO TBCR
40.0000 meq | EXTENDED_RELEASE_TABLET | Freq: Once | ORAL | Status: AC
  Administered 2020-10-21: 40 meq via ORAL
  Filled 2020-10-21: qty 2

## 2020-10-21 MED ORDER — HEPARIN (PORCINE) IN NACL 1000-0.9 UT/500ML-% IV SOLN
INTRAVENOUS | Status: AC
  Filled 2020-10-21: qty 1000

## 2020-10-21 MED ORDER — MIDAZOLAM HCL 2 MG/2ML IJ SOLN
INTRAMUSCULAR | Status: AC
  Filled 2020-10-21: qty 2

## 2020-10-21 MED ORDER — HEPARIN (PORCINE) IN NACL 1000-0.9 UT/500ML-% IV SOLN
INTRAVENOUS | Status: DC | PRN
  Administered 2020-10-21 (×2): 500 mL

## 2020-10-21 MED ORDER — ACETAMINOPHEN 325 MG PO TABS: 650.0000 mg | ORAL_TABLET | ORAL | Status: DC | PRN

## 2020-10-21 MED ORDER — NITROGLYCERIN 0.4 MG SL SUBL: 0.4000 mg | SUBLINGUAL_TABLET | SUBLINGUAL | Status: DC | PRN

## 2020-10-21 MED ORDER — SODIUM CHLORIDE 0.9 % IV SOLN: 250.0000 mL | INTRAVENOUS | Status: DC | PRN

## 2020-10-21 MED ORDER — HEPARIN BOLUS VIA INFUSION
4000.0000 [IU] | Freq: Once | INTRAVENOUS | Status: AC
  Administered 2020-10-21: 4000 [IU] via INTRAVENOUS

## 2020-10-21 MED ORDER — HEPARIN (PORCINE) 25000 UT/250ML-% IV SOLN
900.0000 [IU]/h | INTRAVENOUS | Status: DC
  Administered 2020-10-21: 900 [IU]/h via INTRAVENOUS
  Filled 2020-10-21: qty 250

## 2020-10-21 MED ORDER — POTASSIUM CHLORIDE 10 MEQ/100ML IV SOLN
10.0000 meq | Freq: Once | INTRAVENOUS | Status: AC
  Administered 2020-10-21: 10 meq via INTRAVENOUS
  Filled 2020-10-21: qty 100

## 2020-10-21 MED ORDER — MIDAZOLAM HCL 2 MG/2ML IJ SOLN
INTRAMUSCULAR | Status: DC | PRN
  Administered 2020-10-21: 1 mg via INTRAVENOUS

## 2020-10-21 MED ORDER — FENTANYL CITRATE (PF) 100 MCG/2ML IJ SOLN
INTRAMUSCULAR | Status: AC
  Filled 2020-10-21: qty 2

## 2020-10-21 MED ORDER — LOSARTAN POTASSIUM 50 MG PO TABS
100.0000 mg | ORAL_TABLET | Freq: Every day | ORAL | Status: DC
  Administered 2020-10-22: 100 mg via ORAL
  Filled 2020-10-21: qty 2

## 2020-10-21 MED ORDER — VERAPAMIL HCL 2.5 MG/ML IV SOLN
INTRAVENOUS | Status: AC
  Filled 2020-10-21: qty 2

## 2020-10-21 MED ORDER — ASPIRIN EC 81 MG PO TBEC
81.0000 mg | DELAYED_RELEASE_TABLET | Freq: Every day | ORAL | Status: DC
Start: 2020-10-22 — End: 2020-10-21

## 2020-10-21 MED ORDER — ONDANSETRON HCL 4 MG/2ML IJ SOLN: 4.0000 mg | Freq: Four times a day (QID) | INTRAMUSCULAR | Status: DC | PRN

## 2020-10-21 MED ORDER — MORPHINE SULFATE (PF) 2 MG/ML IV SOLN
2.0000 mg | Freq: Once | INTRAVENOUS | Status: AC
Start: 2020-10-21 — End: 2020-10-21
  Administered 2020-10-21: 2 mg via INTRAVENOUS
  Filled 2020-10-21: qty 1

## 2020-10-21 MED ORDER — IOHEXOL 350 MG/ML SOLN
INTRAVENOUS | Status: DC | PRN
  Administered 2020-10-21: 100 mL via INTRA_ARTERIAL

## 2020-10-21 MED ORDER — LIDOCAINE HCL (PF) 1 % IJ SOLN
INTRAMUSCULAR | Status: AC
  Filled 2020-10-21: qty 30

## 2020-10-21 MED ORDER — FENTANYL CITRATE (PF) 100 MCG/2ML IJ SOLN
INTRAMUSCULAR | Status: DC | PRN
  Administered 2020-10-21: 25 ug via INTRAVENOUS

## 2020-10-21 MED ORDER — SODIUM CHLORIDE 0.9 % IV SOLN: INTRAVENOUS | Status: DC

## 2020-10-21 MED ORDER — LORAZEPAM 0.5 MG PO TABS
0.5000 mg | ORAL_TABLET | Freq: Three times a day (TID) | ORAL | Status: DC | PRN
  Administered 2020-10-22: 0.5 mg via ORAL
  Filled 2020-10-21: qty 1

## 2020-10-21 MED ORDER — VERAPAMIL HCL 2.5 MG/ML IV SOLN
INTRAVENOUS | Status: DC | PRN
  Administered 2020-10-21: 10 mL via INTRA_ARTERIAL

## 2020-10-21 MED ORDER — HEPARIN SODIUM (PORCINE) 1000 UNIT/ML IJ SOLN
INTRAMUSCULAR | Status: DC | PRN
  Administered 2020-10-21: 5000 [IU] via INTRAVENOUS

## 2020-10-21 MED ORDER — LABETALOL HCL 5 MG/ML IV SOLN: 10.0000 mg | INTRAVENOUS | Status: AC | PRN

## 2020-10-21 MED ORDER — HYDRALAZINE HCL 20 MG/ML IJ SOLN: 10.0000 mg | INTRAMUSCULAR | Status: AC | PRN

## 2020-10-21 MED ORDER — SODIUM CHLORIDE 0.9 % IV BOLUS
250.0000 mL | Freq: Once | INTRAVENOUS | Status: AC
  Administered 2020-10-21: 250 mL via INTRAVENOUS

## 2020-10-21 MED ORDER — SODIUM CHLORIDE 0.9% FLUSH
3.0000 mL | Freq: Two times a day (BID) | INTRAVENOUS | Status: DC
  Administered 2020-10-21: 3 mL via INTRAVENOUS

## 2020-10-21 MED ORDER — SODIUM CHLORIDE 0.9% FLUSH
3.0000 mL | INTRAVENOUS | Status: DC | PRN
  Administered 2020-10-22: 3 mL via INTRAVENOUS

## 2020-10-21 MED ORDER — SODIUM CHLORIDE 0.9% FLUSH: 3.0000 mL | Freq: Two times a day (BID) | INTRAVENOUS | Status: DC

## 2020-10-21 MED ORDER — LIDOCAINE HCL (PF) 1 % IJ SOLN
INTRAMUSCULAR | Status: DC | PRN
  Administered 2020-10-21: 2 mL

## 2020-10-21 MED ORDER — HEPARIN SODIUM (PORCINE) 1000 UNIT/ML IJ SOLN
INTRAMUSCULAR | Status: AC
  Filled 2020-10-21: qty 1

## 2020-10-21 MED ORDER — NITROGLYCERIN 0.4 MG SL SUBL
0.4000 mg | SUBLINGUAL_TABLET | Freq: Once | SUBLINGUAL | Status: AC
  Administered 2020-10-21: 0.4 mg via SUBLINGUAL
  Filled 2020-10-21: qty 1

## 2020-10-21 SURGICAL SUPPLY — 12 items
CATH DIAG 6FR JR4 (CATHETERS) ×1 IMPLANT
CATH DIAG 6FR PIGTAIL ANGLED (CATHETERS) ×1 IMPLANT
CATH INFINITI 6F FL3.5 (CATHETERS) ×1 IMPLANT
DEVICE RAD COMP TR BAND LRG (VASCULAR PRODUCTS) ×1 IMPLANT
GLIDESHEATH SLEND SS 6F .021 (SHEATH) ×1 IMPLANT
GUIDEWIRE INQWIRE 1.5J.035X260 (WIRE) IMPLANT
INQWIRE 1.5J .035X260CM (WIRE) ×2
KIT HEART LEFT (KITS) ×1 IMPLANT
PACK CARDIAC CATHETERIZATION (CUSTOM PROCEDURE TRAY) ×1 IMPLANT
TRANSDUCER W/STOPCOCK (MISCELLANEOUS) ×1 IMPLANT
TUBING CIL FLEX 10 FLL-RA (TUBING) ×1 IMPLANT
TUBING CONTRAST HIGH PRESS 48 (TUBING) ×1 IMPLANT

## 2020-10-21 NOTE — Progress Notes (Signed)
ANTICOAGULATION CONSULT NOTE - Initial Consult  Pharmacy Consult for heparin Indication: chest pain/ACS  No Known Allergies  Patient Measurements: Height: 5\' 4"  (162.6 cm) Weight: 74.8 kg (165 lb) IBW/kg (Calculated) : 54.7 Heparin Dosing Weight:   Vital Signs: Temp: 98.4 F (36.9 C) (10/19 1039) Temp Source: Oral (10/19 1039) BP: 118/66 (10/19 1130) Pulse Rate: 57 (10/19 1130)  Labs: Recent Labs    10/19/20 1055 10/21/20 1100  HGB 13.7 13.5  HCT 42.3 42.0  PLT 208 186  CREATININE 0.71 0.81  TROPONINIHS  --  2,728*    Estimated Creatinine Clearance: 51.2 mL/min (by C-G formula based on SCr of 0.81 mg/dL).   Medical History: Past Medical History:  Diagnosis Date   Anxiety    Arthritis    Breast cancer ([place])    Right breast   Essential hypertension    Family history of colon cancer    Family history of ovarian cancer    Family history of pancreatic cancer    High cholesterol    Hyperlipidemia    Skin cancer    Patient stated  she has low grade skin cancer in right hand    Medications:  (Not in a hospital admission)   Assessment: Pharmacy consulted to dose heparin in patient with chest pain/ACS.  Patient is not on anticoagulation prior to admission.  Trop 2,728   Goal of Therapy:  Heparin level 0.3-0.7 units/ml Monitor platelets by anticoagulation protocol: Yes   Plan:  Give 4000 units bolus x 1 Start heparin infusion at 900 units/hr Check anti-Xa level in 8 hours and daily while on heparin Continue to monitor H&H and platelets  Margot Ables, PharmD Clinical Pharmacist 10/21/2020 12:08 PM

## 2020-10-21 NOTE — Interval H&P Note (Signed)
History and Physical Interval Note:  10/21/2020 4:56 PM  Tiffany Sweeney  has presented today for surgery, with the diagnosis of nstemi.  The various methods of treatment have been discussed with the patient and family. After consideration of risks, benefits and other options for treatment, the patient has consented to  Procedure(s): LEFT HEART CATH AND CORONARY ANGIOGRAPHY (N/A) as a surgical intervention.  The patient's history has been reviewed, patient examined, no change in status, stable for surgery.  I have reviewed the patient's chart and labs.  Questions were answered to the patient's satisfaction.    Cath Lab Visit (complete for each Cath Lab visit)  Clinical Evaluation Leading to the Procedure:   ACS: Yes.    Non-ACS:    Anginal Classification: CCS IV  Anti-ischemic medical therapy: Minimal Therapy (1 class of medications)  Non-Invasive Test Results: No non-invasive testing performed  Prior CABG: No previous CABG        Early Osmond

## 2020-10-21 NOTE — H&P (Addendum)
Cardiology Admission History and Physical:   Patient ID: Tiffany Sweeney MRN: [id number]; DOB: Mar 10, 1934   Admission date: 10/21/2020  PCP:  Lemmie Evens, MD   [facility] Providers Cardiologist: New to [facility] - Dr. Domenic Polite   Chief Complaint: Chest Pain  Patient Profile:   Tiffany Sweeney is a 86 y.o. female with past medical history of HTN, lymphedema and history of breast cancer (s/p left mastectomy in 2015) who is being seen 10/21/2020 for the evaluation of chest pain at the request of Dr. Roderic Palau.  History of Present Illness:   Tiffany Sweeney presented to [facility] ED this morning for evaluation of chest pain and shortness of breath. She reports having worsening dyspnea on exertion over the past several weeks and has noticed this mostly when walking up and down her driveway with her trash cans. Yesterday, she reports significant diarrhea throughout the day and consumed minimal food. Reports she did feel short of breath but did not have any chest pain at that time. This morning, when she awoke from sleep she did notice a pressure along her sternal region which did not radiate but SBP was in the 200's which alarmed her. Denies any associated nausea, vomiting or diaphoresis. No recent orthopnea or PND.   No known history of CAD, CHF or cardiac arrhythmias. Reports her dad said he had a "bad heart" but is unsure of the specifics. She is a former smoker but no recent use.   Initial labs show WBC 7.7, Hgb 13.5, platelets 186, Na+ 138, K+ 3.1 and creatinine 0.81. Initial Hs Troponin 2728. EKG shows NSR, HR 66 with slight ST elevation along the inferior leads and significant TWI along the inferior and lateral leads.   Past Medical History:  Diagnosis Date   Anxiety    Arthritis    Breast cancer ([place])    Essential hypertension    Family history of colon cancer    Family history of ovarian cancer    Family history of pancreatic cancer    High cholesterol    Hyperlipidemia    Skin  cancer    Patient stated  she has low grade skin cancer in right hand    Past Surgical History:  Procedure Laterality Date   ABDOMINAL HYSTERECTOMY     CATARACT EXTRACTION W/PHACO Left 03/11/2013   Procedure: CATARACT EXTRACTION PHACO AND INTRAOCULAR LENS PLACEMENT ([place]);  Surgeon: Tonny Branch, MD;  Location: [facility];  Service: Ophthalmology;  Laterality: Left;  CDE:7.54   CATARACT EXTRACTION W/PHACO Right 04/15/2013   Procedure: CATARACT EXTRACTION PHACO AND INTRAOCULAR LENS PLACEMENT ([facility]);  Surgeon: Tonny Branch, MD;  Location: [facility];  Service: Ophthalmology;  Laterality: Right;  CDE 9.38   MASTECTOMY Right    MASTECTOMY MODIFIED RADICAL Left 11/04/2013   Procedure: LEFT MASTECTOMY MODIFIED RADICAL;  Surgeon: Jamesetta So, MD;  Location: [facility];  Service: General;  Laterality: Left;   PORTACATH PLACEMENT Right 12/06/2013   Procedure: INSERTION PORT-A-CATH-Right subclavian;  Surgeon: Jamesetta So, MD;  Location: [facility];  Service: General;  Laterality: Right;   TOTAL HIP ARTHROPLASTY Right 08/05/2015   Procedure: RIGHT TOTAL HIP ARTHROPLASTY ANTERIOR APPROACH;  Surgeon: Gaynelle Arabian, MD;  Location: [facility];  Service: Orthopedics;  Laterality: Right;     Medications Prior to Admission: Prior to Admission medications   Medication Sig Start Date End Date Taking? Authorizing Provider  atenolol (TENORMIN) 50 MG tablet Take 50 mg by mouth daily.    [provider]  cloNIDine (CATAPRES) 0.1 MG tablet  Take 0.1 mg by mouth 2 (two) times daily as needed (only for systolic pressure greater than 200). 03/19/19   [provider]  LORazepam (ATIVAN) 0.5 MG tablet Take 0.5 mg by mouth every 8 (eight) hours as needed for anxiety. 07/11/17   [provider]  losartan (COZAAR) 100 MG tablet Take 100 mg by mouth daily. 08/20/18   [provider]  potassium chloride SA (KLOR-CON) 20 MEQ tablet Take 1 tablet (20 mEq total) by mouth 2 (two) times daily. 10/24/19   Jacquelin Hawking,  NP  torsemide (DEMADEX) 10 MG tablet Take 10 mg by mouth daily. Patient not taking: No sig reported    [provider]     Allergies:   No Known Allergies  Social History:   Social History   Socioeconomic History   Marital status: Widowed    Spouse name: Not on file   Number of children: 3   Years of education: Not on file   Highest education level: Not on file  Occupational History   Not on file  Tobacco Use   Smoking status: Former    Packs/day: 0.25    Years: 30.00    Pack years: 7.50    Types: Cigarettes    Start date: 10/10/1956    Quit date: 03/08/1985    Years since quitting: 35.6   Smokeless tobacco: Never   Tobacco comments:    quit 25 years ago  Vaping Use   Vaping Use: Never used  Substance and Sexual Activity   Alcohol use: No   Drug use: No   Sexual activity: Yes    Birth control/protection: Post-menopausal  Other Topics Concern   Not on file  Social History Narrative   Not on file   Social Determinants of Health   Financial Resource Strain: Not on file  Food Insecurity: Not on file  Transportation Needs: Not on file  Physical Activity: Not on file  Stress: Not on file  Social Connections: Not on file  Intimate Partner Violence: Not on file    Family History:   The patient's family history includes Cancer in her cousin; Colon cancer in her cousin; Dementia in her father; Diabetes in her mother; Lung cancer (age of onset: 6) in her sister; Ovarian cancer (age of onset: 30) in her sister; Pancreatic cancer (age of onset: 75) in her brother; Rectal cancer (age of onset: 23) in her mother.    ROS:  Please see the history of present illness.  All other ROS reviewed and negative.     Physical Exam/Data:   Vitals:   10/21/20 1100 10/21/20 1130 10/21/20 1205 10/21/20 1430  BP: (!) 170/82 118/66 134/65 (!) 154/73  Pulse: 63 (!) 57 (!) 50 (!) 55  Resp: 17 17 17 20   Temp:      TempSrc:      SpO2: 98% 95% 96% 98%  Weight:      Height:        No intake or output data in the 24 hours ending 10/21/20 1457 Last 3 Weights 10/21/2020 03/07/2020 03/05/2020  Weight (lbs) 165 lb 163 lb 163 lb  Weight (kg) 74.844 kg 73.936 kg 73.936 kg     Body mass index is 28.32 kg/m.  General: Pleasant female appearing in no acute distress HEENT: normal Neck: no JVD Vascular: No carotid bruits; Distal pulses 2+ bilaterally   Cardiac:  normal S1, S2; RRR; no murmur  Lungs:  clear to auscultation bilaterally, no wheezing, rhonchi or rales  Abd: soft, nontender, no hepatomegaly  Ext: no pitting edema Musculoskeletal:  No deformities, BUE and BLE strength normal and equal Skin: warm and dry  Neuro:  CNs 2-12 intact, no focal abnormalities noted Psych:  Normal affect    EKG:  The ECG that was done was personally reviewed and demonstrates NSR, HR 66 with slight ST elevation along the inferior leads and significant TWI along the inferior and lateral leads.   Relevant CV Studies:  Echocardiogram: 03/12/2014 Study Conclusions   - Left ventricle: The cavity size was normal. Wall thickness was    normal. Systolic function was normal. The estimated ejection    fraction was in the range of 60% to 65%. Wall motion was normal;    there were no regional wall motion abnormalities. Doppler    parameters are consistent with abnormal left ventricular    relaxation (grade 1 diastolic dysfunction).  - Mitral valve: Mild prolapse, involving the posterior leaflet.    There was trivial regurgitation.  - Right atrium: Central venous pressure (est): 3 mm Hg.  - Tricuspid valve: There was trivial regurgitation.  - Pulmonary arteries: PA peak pressure: 20 mm Hg (S).  - Pericardium, extracardiac: A prominent pericardial fat pad was    present.   Impressions:   - Normal LV wall thickness and chamber size with LVEF 60-65% and    grade 1 diastolic dysfunction. Mild mitral valve prolapse    involving the posterior leaflet, associated with trivial mitral     regurgitation. Normal PASP 20 mmHg.   Laboratory Data:  High Sensitivity Troponin:   Recent Labs  Lab 10/21/20 1100 10/21/20 1323  TROPONINIHS 2,728* 5,300*      Chemistry Recent Labs  Lab 10/19/20 1055 10/21/20 1100  NA 141 138  K 3.4* 3.1*  CL 106 105  CO2 29 25  GLUCOSE 105* 144*  BUN 13 14  CREATININE 0.71 0.81  CALCIUM 8.8* 8.3*  GFRNONAA >60 >60  ANIONGAP 6 8    Recent Labs  Lab 10/19/20 1055 10/21/20 1100  PROT 6.6 6.1*  ALBUMIN 3.7 3.3*  AST 16 29  ALT 14 16  ALKPHOS 67 65  BILITOT 0.5 0.9   Lipids  Recent Labs  Lab 10/19/20 1044  CHOL 240*  TRIG 139  HDL 47  LDLCALC 165*  CHOLHDL 5.1   Hematology Recent Labs  Lab 10/19/20 1055 10/21/20 1100  WBC 5.6 7.7  RBC 4.62 4.56  HGB 13.7 13.5  HCT 42.3 42.0  MCV 91.6 92.1  MCH 29.7 29.6  MCHC 32.4 32.1  RDW 13.1 13.2  PLT 208 186   Thyroid No results for input(s): TSH, FREET4 in the last 168 hours. BNPNo results for input(s): BNP, PROBNP in the last 168 hours.  DDimer No results for input(s): DDIMER in the last 168 hours.   Radiology/Studies:  DG Chest Port 1 View  Result Date: 10/21/2020 CLINICAL DATA:  86 year old female with shortness of breath, chest tightness. Hypertensive, systolic blood pressure greater than 200. EXAM: PORTABLE CHEST 1 VIEW COMPARISON:  Portable chest 12/06/2013 and earlier. FINDINGS: Portable AP upright view at 1123 hours. Stable right chest power port. Lung volumes and mediastinal contours are stable, within normal limits. Postoperative changes to the bilateral chest wall, axilla. Allowing for portable technique the lungs are clear. No pneumothorax or pleural effusion. No acute osseous abnormality identified. IMPRESSION: No acute cardiopulmonary abnormality. Electronically Signed   By: Genevie Ann M.D.   On: 10/21/2020 11:45     Assessment and Plan:  1. NSTEMI - She presents with chest pressure which started this morning but reports worsening dyspnea on exertion over  the past several weeks. Initial Hs Troponin 2728 with repeat values pending. EKG shows slight ST elevation along the inferior leads and significant TWI along the inferior and lateral leads. Will order an echocardiogram. Check Hgb A1c and FLP.  - Given her presentation, abnormal EKG and elevated enzymes, will plan for a cardiac catheterization today. The patient understands that risks include but are not limited to stroke (1 in 1000), death (1 in 16), kidney failure [usually temporary] (1 in 500), bleeding (1 in 200), allergic reaction [possibly serious] (1 in 200).  - Continue Heparin and ASA 81mg  daily. Will start Crestor 40mg  daily. Hold BB for now given HR in the 50's (she was on Atenolol 50mg  daily prior to admission).   2. HTN - She is on Atenolol, Losartan and PRN Clonidine as an outpatient. Will hold Atenolol for now given her heart rate in the 50's and plan to restart Losartan tomorrow given cardiac catheterization today.  3. Lymphedema - She has a history of this but no recent symptoms. Echocardiogram pending to assess LV function and wall motion. She was listed as being on Torsemide 10 mg daily prior to admission but has not been taking this.   4. Hypokalemia - Suspect this is secondary to her diarrhea yesterday which has now resolved. She did receive 10 mEq of potassium supplementation while in the ED. Will order an additional 40 mEq prior to her cath.    Risk Assessment/Risk Scores:    TIMI Risk Score for Unstable Angina or Non-ST Elevation MI:   The patient's TIMI risk score is 4, which indicates a 20% risk of all cause mortality, new or recurrent myocardial infarction or need for urgent revascularization in the next 14 days.     For questions or updates, please contact [place] Please consult [email or web address] for contact info under     Signed, Erma Heritage, PA-C 10/21/2020 2:57 PM    Attending note:  Patient seen and examined.  I reviewed her records and  discussed the case with the Delano Metz, I agree with her above findings.  Ms. Strength presents to the [facility] ER for evaluation of chest pressure that began this morning, also associated with elevated blood pressure.  She has had worsening shortness of breath with activity over the last several weeks, otherwise episode of diarrhea yesterday but no other obvious health changes.  She has a previous history of breast cancer (in remission, last saw oncology in 2020 - note reviewed).  Chest discomfort was nitrate responsive.  On evaluation in the ER patient in no acute distress.  Afebrile, heart rate in the 50s to 60s in sinus rhythm by telemetry which I personally reviewed.  Systolic blood pressure ranging 120-170.  Lungs are clear.  Cardiac exam with RRR and 1/6 systolic murmur.  Pertinent lab work includes potassium 3.1, BUN 14, creatinine 0.81, high-sensitivity troponin I 2728 up to 5300, LDL 165, hemoglobin 13.5, platelets 186, SARS coronavirus 2 test negative.  Chest x-ray reports no acute process.  I personally reviewed her ECG which shows sinus rhythm with significant inferior and anterolateral deep T wave inversions that are new compared to prior tracing from March.  Unstable angina with evidence of NSTEMI.  Diagnostic cardiac catheterization for evaluation of coronary anatomy and assessment for revascularization options discussed with patient and she is in agreement to proceed.  Risks and benefits reviewed.  Continue aspirin, hold beta-blocker given low resting heart rate.  Starting high-dose statin and continue heparin initiated in ER.  She also is receiving potassium repletion in the ER.  Transfer anticipated to [facility] today.  Satira Sark, M.D., F.A.C.C.

## 2020-10-21 NOTE — Progress Notes (Signed)
*  PRELIMINARY RESULTS* Echocardiogram 2D Echocardiogram has been performed.  Tiffany Sweeney 10/21/2020, 3:05 PM

## 2020-10-21 NOTE — ED Notes (Signed)
Cardiology at bedside w/ patient and family

## 2020-10-21 NOTE — ED Provider Notes (Signed)
[facility] EMERGENCY DEPARTMENT Provider Note   CSN: [id number] Arrival date & time: 10/21/20  1023     History Chief Complaint  Patient presents with   Hypertension   Chest Pain    Tiffany Sweeney is a 86 y.o. female.  Patient states she awoke this morning with chest pressure and some shortness of breath.  No sweating no pain  The history is provided by the patient and medical records. No language interpreter was used.  Chest Pain Pain location:  Substernal area Pain quality: dull   Pain radiates to:  Does not radiate Pain severity:  Moderate Onset quality:  Sudden Timing:  Constant Progression:  Waxing and waning Chronicity:  New Context: not breathing   Relieved by:  Nothing Associated symptoms: no abdominal pain, no back pain, no cough, no fatigue and no headache       Past Medical History:  Diagnosis Date   Anxiety    Arthritis    Breast cancer ([place])    Right breast   Essential hypertension    Family history of colon cancer    Family history of ovarian cancer    Family history of pancreatic cancer    High cholesterol    Hyperlipidemia    Skin cancer    Patient stated  she has low grade skin cancer in right hand    Patient Active Problem List   Diagnosis Date Noted   NSTEMI (non-ST elevated myocardial infarction) ([place]) 10/21/2020   Health care maintenance 11/02/2015   Blood glucose elevated 11/02/2015   OA (osteoarthritis) of hip 08/05/2015   Genetic testing 04/29/2014   Family history of ovarian cancer    Family history of colon cancer    Family history of pancreatic cancer    Breast cancer ([place]) 11/04/2013   Low back pain 11/12/2012    Past Surgical History:  Procedure Laterality Date   ABDOMINAL HYSTERECTOMY     CATARACT EXTRACTION W/PHACO Left 03/11/2013   Procedure: CATARACT EXTRACTION PHACO AND INTRAOCULAR LENS PLACEMENT (IOC);  Surgeon: Tonny Branch, MD;  Location: [facility];  Service: Ophthalmology;  Laterality: Left;  CDE:7.54   CATARACT  EXTRACTION W/PHACO Right 04/15/2013   Procedure: CATARACT EXTRACTION PHACO AND INTRAOCULAR LENS PLACEMENT (IOC);  Surgeon: Tonny Branch, MD;  Location: [facility];  Service: Ophthalmology;  Laterality: Right;  CDE 9.38   MASTECTOMY Right    MASTECTOMY MODIFIED RADICAL Left 11/04/2013   Procedure: LEFT MASTECTOMY MODIFIED RADICAL;  Surgeon: Jamesetta So, MD;  Location: [facility];  Service: General;  Laterality: Left;   PORTACATH PLACEMENT Right 12/06/2013   Procedure: INSERTION PORT-A-CATH-Right subclavian;  Surgeon: Jamesetta So, MD;  Location: [facility];  Service: General;  Laterality: Right;   TOTAL HIP ARTHROPLASTY Right 08/05/2015   Procedure: RIGHT TOTAL HIP ARTHROPLASTY ANTERIOR APPROACH;  Surgeon: Gaynelle Arabian, MD;  Location: [facility];  Service: Orthopedics;  Laterality: Right;     OB History     Gravida  4   Para  3   Term  3   Preterm      AB  1   Living  2      SAB  1   IAB      Ectopic      Multiple      Live Births              Family History  Problem Relation Age of Onset   Diabetes Mother    Rectal cancer Mother 61  died from complications of DM   Dementia Father        Died at 51 in an accident   Pancreatic cancer Brother 36   Lung cancer Sister 20       "small oat cell"   Ovarian cancer Sister 51   Cancer Cousin        maternal 1st cousin with either ovarian or uterine   Colon cancer Cousin        paternal first cousin dx at unknown age    Social History   Tobacco Use   Smoking status: Former    Packs/day: 0.25    Years: 30.00    Pack years: 7.50    Types: Cigarettes    Start date: 10/10/1956    Quit date: 03/08/1985    Years since quitting: 35.6   Smokeless tobacco: Never   Tobacco comments:    quit 25 years ago  Vaping Use   Vaping Use: Never used  Substance Use Topics   Alcohol use: No   Drug use: No    Home Medications Prior to Admission medications   Medication Sig Start Date End Date Taking? Authorizing Provider  atenolol  (TENORMIN) 50 MG tablet Take 50 mg by mouth daily.    [provider]  cloNIDine (CATAPRES) 0.1 MG tablet Take 0.1 mg by mouth 2 (two) times daily as needed (only for systolic pressure greater than 200). 03/19/19   [provider]  LORazepam (ATIVAN) 0.5 MG tablet Take 0.5 mg by mouth every 8 (eight) hours as needed for anxiety. 07/11/17   [provider]  losartan (COZAAR) 100 MG tablet Take 100 mg by mouth daily. 08/20/18   [provider]  potassium chloride SA (KLOR-CON) 20 MEQ tablet Take 1 tablet (20 mEq total) by mouth 2 (two) times daily. 10/24/19   Jacquelin Hawking, NP  torsemide (DEMADEX) 10 MG tablet Take 10 mg by mouth daily. Patient not taking: No sig reported    [provider]    Allergies    Patient has no known allergies.  Review of Systems   Review of Systems  Constitutional:  Negative for appetite change and fatigue.  HENT:  Negative for congestion, ear discharge and sinus pressure.   Eyes:  Negative for discharge.  Respiratory:  Negative for cough.   Cardiovascular:        Chest pressure  Gastrointestinal:  Negative for abdominal pain and diarrhea.  Genitourinary:  Negative for frequency and hematuria.  Musculoskeletal:  Negative for back pain.  Skin:  Negative for rash.  Neurological:  Negative for seizures and headaches.  Psychiatric/Behavioral:  Negative for hallucinations.    Physical Exam Updated Vital Signs BP 134/65   Pulse (!) 50   Temp 98.4 F (36.9 C) (Oral)   Resp 17   Ht 5\' 4"  (1.626 m)   Wt 74.8 kg   SpO2 96%   BMI 28.32 kg/m   Physical Exam Vitals and nursing note reviewed.  Constitutional:      Appearance: She is well-developed.  HENT:     Head: Normocephalic.     Nose: Nose normal.  Eyes:     General: No scleral icterus.    Conjunctiva/sclera: Conjunctivae normal.  Neck:     Thyroid: No thyromegaly.  Cardiovascular:     Rate and Rhythm: Normal rate and regular rhythm.     Heart sounds:  No murmur heard.   No friction rub. No gallop.  Pulmonary:     Breath sounds:  No stridor. No wheezing or rales.  Chest:     Chest wall: No tenderness.  Abdominal:     General: There is no distension.     Tenderness: There is no abdominal tenderness. There is no rebound.  Musculoskeletal:        General: Normal range of motion.     Cervical back: Neck supple.  Lymphadenopathy:     Cervical: No cervical adenopathy.  Skin:    Findings: No erythema or rash.  Neurological:     Mental Status: She is alert and oriented to person, place, and time.     Motor: No abnormal muscle tone.     Coordination: Coordination normal.  Psychiatric:        Behavior: Behavior normal.    ED Results / Procedures / Treatments   Labs (all labs ordered are listed, but only abnormal results are displayed) Labs Reviewed  COMPREHENSIVE METABOLIC PANEL - Abnormal; Notable for the following components:      Result Value   Potassium 3.1 (*)    Glucose, Bld 144 (*)    Calcium 8.3 (*)    Total Protein 6.1 (*)    Albumin 3.3 (*)    All other components within normal limits  TROPONIN I (HIGH SENSITIVITY) - Abnormal; Notable for the following components:   Troponin I (High Sensitivity) 2,728 (*)    All other components within normal limits  RESP PANEL BY RT-PCR (FLU A&B, COVID) ARPGX2  CBC WITH DIFFERENTIAL/PLATELET  HEPARIN LEVEL (UNFRACTIONATED)  TROPONIN I (HIGH SENSITIVITY)    EKG None  Radiology DG Chest Port 1 View  Result Date: 10/21/2020 CLINICAL DATA:  86 year old female with shortness of breath, chest tightness. Hypertensive, systolic blood pressure greater than 200. EXAM: PORTABLE CHEST 1 VIEW COMPARISON:  Portable chest 12/06/2013 and earlier. FINDINGS: Portable AP upright view at 1123 hours. Stable right chest power port. Lung volumes and mediastinal contours are stable, within normal limits. Postoperative changes to the bilateral chest wall, axilla. Allowing for portable technique the lungs  are clear. No pneumothorax or pleural effusion. No acute osseous abnormality identified. IMPRESSION: No acute cardiopulmonary abnormality. Electronically Signed   By: Genevie Ann M.D.   On: 10/21/2020 11:45    Procedures Procedures   Medications Ordered in ED Medications  potassium chloride 10 mEq in 100 mL IVPB (10 mEq Intravenous New Bag/Given 10/21/20 1200)  heparin ADULT infusion 100 units/mL (25000 units/260mL) (900 Units/hr Intravenous New Bag/Given 10/21/20 1214)  0.9 %  sodium chloride infusion (has no administration in time range)  losartan (COZAAR) tablet 100 mg (has no administration in time range)  LORazepam (ATIVAN) tablet 0.5 mg (has no administration in time range)  aspirin EC tablet 81 mg (has no administration in time range)  nitroGLYCERIN (NITROSTAT) SL tablet 0.4 mg (has no administration in time range)  acetaminophen (TYLENOL) tablet 650 mg (has no administration in time range)  ondansetron (ZOFRAN) injection 4 mg (has no administration in time range)  rosuvastatin (CRESTOR) tablet 40 mg (has no administration in time range)  nitroGLYCERIN (NITROSTAT) SL tablet 0.4 mg (0.4 mg Sublingual Given 10/21/20 1117)  morphine 2 MG/ML injection 2 mg (2 mg Intravenous Given 10/21/20 1125)  aspirin tablet 325 mg (325 mg Oral Given 10/21/20 1116)  sodium chloride 0.9 % bolus 250 mL (250 mLs Intravenous Bolus 10/21/20 1158)  heparin bolus via infusion 4,000 Units (4,000 Units Intravenous Bolus from Bag 10/21/20 1215)    ED Course  I have reviewed the triage vital signs and the  nursing notes.  Pertinent labs & imaging results that were available during my care of the patient were reviewed by me and considered in my medical decision making (see chart for details). CRITICAL CARE Performed by: Milton Ferguson Total critical care time: 45 minutes Critical care time was exclusive of separately billable procedures and treating other patients. Critical care was necessary to treat or prevent  imminent or life-threatening deterioration. Critical care was time spent personally by me on the following activities: development of treatment plan with patient and/or surrogate as well as nursing, discussions with consultants, evaluation of patient's response to treatment, examination of patient, obtaining history from patient or surrogate, ordering and performing treatments and interventions, ordering and review of laboratory studies, ordering and review of radiographic studies, pulse oximetry and re-evaluation of patient's condition. Patient with an NSTEMI.  I spoke with cardiology and they will admit the patient to [facility].  She is started on aspirin nitroglycerin and heparin.   MDM Rules/Calculators/A&P                           Patient with acute NSTEMI.  She is treated with nitroglycerin and aspirin and heparin and will be admitted to cardiology over Final Clinical Impression(s) / ED Diagnoses Final diagnoses:  NSTEMI (non-ST elevated myocardial infarction) [facility])    Rx / [place] Orders ED Discharge Orders     None        Milton Ferguson, MD 10/21/20 1249

## 2020-10-21 NOTE — ED Triage Notes (Signed)
Pt woke up this morning with tightness in her chest, short of breath and SBP over 200. Pt took an extra dose of clonidine 0.1mg  without improvement. Loose stool all day yesterday.

## 2020-10-22 ENCOUNTER — Encounter (HOSPITAL_COMMUNITY): Payer: Self-pay | Admitting: Internal Medicine

## 2020-10-22 ENCOUNTER — Other Ambulatory Visit (HOSPITAL_COMMUNITY): Payer: Self-pay

## 2020-10-22 ENCOUNTER — Telehealth: Payer: Self-pay | Admitting: *Deleted

## 2020-10-22 DIAGNOSIS — I2510 Atherosclerotic heart disease of native coronary artery without angina pectoris: Secondary | ICD-10-CM

## 2020-10-22 DIAGNOSIS — I248 Other forms of acute ischemic heart disease: Secondary | ICD-10-CM | POA: Diagnosis not present

## 2020-10-22 DIAGNOSIS — R9431 Abnormal electrocardiogram [ECG] [EKG]: Secondary | ICD-10-CM

## 2020-10-22 DIAGNOSIS — I10 Essential (primary) hypertension: Secondary | ICD-10-CM | POA: Diagnosis not present

## 2020-10-22 DIAGNOSIS — I214 Non-ST elevation (NSTEMI) myocardial infarction: Secondary | ICD-10-CM | POA: Diagnosis not present

## 2020-10-22 DIAGNOSIS — Z20822 Contact with and (suspected) exposure to covid-19: Secondary | ICD-10-CM | POA: Diagnosis not present

## 2020-10-22 LAB — BASIC METABOLIC PANEL
Anion gap: 9 (ref 5–15)
BUN: 9 mg/dL (ref 8–23)
CO2: 26 mmol/L (ref 22–32)
Calcium: 8.3 mg/dL — ABNORMAL LOW (ref 8.9–10.3)
Chloride: 104 mmol/L (ref 98–111)
Creatinine, Ser: 0.81 mg/dL (ref 0.44–1.00)
GFR, Estimated: >60 mL/min (ref >60)
Glucose, Bld: 114 mg/dL — ABNORMAL HIGH (ref 70–99)
Potassium: 3.2 mmol/L — ABNORMAL LOW (ref 3.5–5.1)
Sodium: 139 mmol/L (ref 135–145)

## 2020-10-22 LAB — CBC
HCT: 42.5 % (ref 36.0–46.0)
Hemoglobin: 14 g/dL (ref 12.0–15.0)
MCH: 29.5 pg (ref 26.0–34.0)
MCHC: 32.9 g/dL (ref 30.0–36.0)
MCV: 89.5 fL (ref 80.0–100.0)
Platelets: 192 10*3/uL (ref 150–400)
RBC: 4.75 MIL/uL (ref 3.87–5.11)
RDW: 13.2 % (ref 11.5–15.5)
WBC: 7.4 10*3/uL (ref 4.0–10.5)
nRBC: 0 % (ref 0.0–0.2)

## 2020-10-22 LAB — LIPID PANEL
Cholesterol: 224 mg/dL — ABNORMAL HIGH (ref 0–200)
HDL: 48 mg/dL (ref >40)
LDL Cholesterol: 152 mg/dL — ABNORMAL HIGH (ref 0–99)
Total CHOL/HDL Ratio: 4.7 RATIO
Triglycerides: 122 mg/dL (ref <150)
VLDL: 24 mg/dL (ref 0–40)

## 2020-10-22 LAB — HEMOGLOBIN A1C
Hgb A1c MFr Bld: 5.5 % (ref 4.8–5.6)
Mean Plasma Glucose: 111.15 mg/dL

## 2020-10-22 MED ORDER — HEPARIN SOD (PORK) LOCK FLUSH 100 UNIT/ML IV SOLN
500.0000 [IU] | INTRAVENOUS | Status: AC | PRN
  Administered 2020-10-22: 500 [IU]
  Filled 2020-10-22: qty 5

## 2020-10-22 MED ORDER — POTASSIUM CHLORIDE CRYS ER 20 MEQ PO TBCR
40.0000 meq | EXTENDED_RELEASE_TABLET | ORAL | Status: AC
  Administered 2020-10-22 (×2): 40 meq via ORAL
  Filled 2020-10-22 (×2): qty 2

## 2020-10-22 MED ORDER — CHLORHEXIDINE GLUCONATE CLOTH 2 % EX PADS: 6.0000 | MEDICATED_PAD | Freq: Every day | CUTANEOUS | Status: DC

## 2020-10-22 MED ORDER — CARVEDILOL 6.25 MG PO TABS
6.2500 mg | ORAL_TABLET | Freq: Two times a day (BID) | ORAL | Status: DC
  Administered 2020-10-22: 6.25 mg via ORAL
  Filled 2020-10-22: qty 1

## 2020-10-22 MED ORDER — CLONIDINE HCL 0.1 MG PO TABS: 0.1000 mg | ORAL_TABLET | Freq: Two times a day (BID) | ORAL | 3 refills | Status: DC

## 2020-10-22 MED ORDER — CARVEDILOL 6.25 MG PO TABS
6.2500 mg | ORAL_TABLET | Freq: Two times a day (BID) | ORAL | 6 refills | Status: DC
  Filled 2020-10-22: qty 60, 30d supply, fill #0

## 2020-10-22 NOTE — Care Management CC44 (Signed)
Condition Code 44 Documentation Completed  Patient Details  Name: Tiffany Sweeney MRN: [id number] Date of Birth: September 03, 1934   Condition Code 44 given:  Yes Patient signature on Condition Code 44 notice:  Yes Documentation of 2 MD's agreement:  Yes Code 44 added to claim:  Yes    Bethena Roys, RN 10/22/2020, 11:14 AM

## 2020-10-22 NOTE — Progress Notes (Signed)
Patient given discharge instructions and stated understanding.  Patient will be leaving via wheelchair as soon as TOC brings her medication.

## 2020-10-22 NOTE — Care Management Obs Status (Signed)
[facility] NOTIFICATION   Patient Details  Name: Tiffany Sweeney MRN: [id number] Date of Birth: [birth year]   Medicare Observation Status Notification Given:  Yes    Bethena Roys, RN 10/22/2020, 11:14 AM

## 2020-10-22 NOTE — Plan of Care (Signed)
  Problem: Education: Goal: Knowledge of General Education information will improve Description: Including pain rating scale, medication(s)/side effects and non-pharmacologic comfort measures Outcome: Completed/Met

## 2020-10-22 NOTE — Telephone Encounter (Signed)
-----   Message from [place], [place] sent at 10/22/2020 10:08 AM EDT ----- Patient has TOC follow up next week. She needs TOC phone call.

## 2020-10-22 NOTE — Discharge Summary (Signed)
Discharge Summary    Patient ID: Jovan Schickling MRN: [id number]; DOB: 1934/03/12  Admit date: 10/21/2020 Discharge date: 10/22/2020  PCP:  Lemmie Evens, MD   [facility] Providers Cardiologist:  Rozann Lesches, MD   {   Discharge Diagnoses    Active Problems:   NSTEMI (non-ST elevated myocardial infarction) [facility])   HTN   Mildly reduce LVEF   Diagnostic Studies/Procedures     Echo 10/21/2020 1. Left ventricular ejection fraction, by estimation, is 45 to 50%. The  left ventricle has mildly decreased function. The left ventricle  demonstrates regional wall motion abnormalities (see scoring  diagram/findings for description). There is mild left  ventricular hypertrophy. Left ventricular diastolic parameters are  consistent with Grade I diastolic dysfunction (impaired relaxation).   2. Right ventricular systolic function is normal. The right ventricular  size is normal. There is normal pulmonary artery systolic pressure. The  estimated right ventricular systolic pressure is 53.6 mmHg.   3. Left atrial size was mildly dilated.   4. The mitral valve is abnormal. Mild mitral valve regurgitation.   5. The aortic valve is tricuspid. Aortic valve regurgitation is not  visualized.   6. The inferior vena cava is normal in size with greater than 50%  respiratory variability, suggesting right atrial pressure of 3 mmHg.  LEFT HEART CATH AND CORONARY ANGIOGRAPHY  10/21/2020   Conclusion      Prox RCA lesion is 20% stenosed.   Mid RCA lesion is 30% stenosed.   LV end diastolic pressure is moderately elevated.   1.  Mild obstructive coronary artery disease. 2.  Moderately elevated LVEDP of 20 mmHg with normal ejection fraction.     History of Present Illness     Han Lysne is a 86 y.o. female with  past medical history of HTN, lymphedema and history of breast cancer (s/p left mastectomy in 2015) who is being seen  for the evaluation of chest pain.   Ms. Cooner  presented to [facility] ED for evaluation of chest pain and shortness of breath. She reported having worsening dyspnea on exertion over the past several weeks and has noticed this mostly when walking up and down her driveway with her trash cans. She reported significant diarrhea throughout the day and consumed minimal food. Reported she did feel short of breath but did not have any chest pain at that time. Next morning, when she awoke from sleep she did notice a pressure along her sternal region which did not radiate but SBP was in the 200's which alarmed her. Denies any associated nausea, vomiting or diaphoresis. No recent orthopnea or PND.    No known history of CAD, CHF or cardiac arrhythmias. Reports her dad said he had a "bad heart" but is unsure of the specifics. She is a former smoker but no recent use.    Initial labs show WBC 7.7, Hgb 13.5, platelets 186, Na+ 138, K+ 3.1 and creatinine 0.81. Initial Hs Troponin 2728. EKG shows NSR, HR 66 with slight ST elevation along the inferior leads and significant TWI along the inferior and lateral leads.   Given symptoms concerning for unstable angina she was transferred to [facility] for cardiac catheterization.   Hospital Course     Consultants: None   Her echocardiogram showed LV function of 45 to 50% and grade 1 diastolic dysfunction.  Normal RV function.  RVSP 24.9 mmHg.  Patient presented to Cath Lab directly showing mild nonobstructive disease.  Moderately elevated LVEDP at 20 mmHg with normal ejection  fraction.  Her atenolol was held prior to transfer secondary to bradycardia in 50s.  She was observed overnight.  No recurrent chest pain or shortness of breath.  She was noted to have an elevated blood pressure during admission.  Heart rate improved to 80s.  Patient reported any pleuritic symptoms.  Patient does endorse elevated blood pressure  (160-200s) at home.  She was taking twice daily dose of clonidine instead of as needed.  She is eating high salt  diet.  Likely her symptoms due to elevated blood pressure.  Thankfully she does not have obstructive disease.  She is started on carvedilol 6.25 mg twice daily.  She will continue twice daily dose of clonidine, recommended down titration/discontinuation in outpatient setting.  Continue losartan 100 mg daily.  If blood pressure remains elevated may consider adding amlodipine.  Discussed low-sodium diet.  She will keep track of her blood pressure and heart rate and bring readings for evaluation with blood pressure cuff.   Did the patient have an acute coronary syndrome (MI, NSTEMI, STEMI, etc) this admission?:  No.   The elevated Troponin was due to the acute medical illness (demand ischemia).    The patient will be scheduled for a TOC follow up appointment in 10 days.  A message has been sent to the [facility] and Scheduling Pool at the office where the patient should be seen for follow up.   Discharge Vitals Blood pressure (!) 143/67, pulse 78, temperature 99.3 F (37.4 C), temperature source Oral, resp. rate 18, height 5\' 4"  (1.626 m), weight 72.2 kg, SpO2 96 %.  Filed Weights   10/21/20 1038 10/22/20 0500  Weight: 74.8 kg 72.2 kg   Physical Exam Constitutional:      Appearance: She is well-developed.  Eyes:     Pupils: Pupils are equal, round, and reactive to light.  Cardiovascular:     Rate and Rhythm: Normal rate and regular rhythm.     Heart sounds: Normal heart sounds.     Comments: Radical cath site without hematoma  Pulmonary:     Effort: Pulmonary effort is normal.     Breath sounds: Normal breath sounds.  Abdominal:     General: Bowel sounds are normal.     Palpations: Abdomen is soft.  Musculoskeletal:        General: Normal range of motion.     Cervical back: Normal range of motion.  Skin:    General: Skin is warm and dry.  Neurological:     General: No focal deficit present.     Mental Status: She is alert and oriented to person, place, and time.  Psychiatric:         Mood and Affect: Mood normal.        Behavior: Behavior normal.    Labs & Radiologic Studies    CBC Recent Labs    10/19/20 1055 10/21/20 1100 10/22/20 0615  WBC 5.6 7.7 7.4  NEUTROABS 3.5 5.4  --   HGB 13.7 13.5 14.0  HCT 42.3 42.0 42.5  MCV 91.6 92.1 89.5  PLT 208 186 353   Basic Metabolic Panel Recent Labs    10/21/20 1100 10/22/20 0615  NA 138 139  K 3.1* 3.2*  CL 105 104  CO2 25 26  GLUCOSE 144* 114*  BUN 14 9  CREATININE 0.81 0.81  CALCIUM 8.3* 8.3*   Liver Function Tests Recent Labs    10/19/20 1055 10/21/20 1100  AST 16 29  ALT 14 16  ALKPHOS  67 65  BILITOT 0.5 0.9  PROT 6.6 6.1*  ALBUMIN 3.7 3.3*    High Sensitivity Troponin:   Recent Labs  Lab 10/21/20 1100 10/21/20 1323  TROPONINIHS 2,728* 5,300*     Hemoglobin A1C Recent Labs    10/22/20 0615  HGBA1C 5.5   Fasting Lipid Panel Recent Labs    10/22/20 0615  CHOL 224*  HDL 48  LDLCALC 152*  TRIG 122  CHOLHDL 4.7   _____________  CARDIAC CATHETERIZATION  Result Date: 10/21/2020   Prox RCA lesion is 20% stenosed.   Mid RCA lesion is 30% stenosed.   LV end diastolic pressure is moderately elevated. 1.  Mild obstructive coronary artery disease. 2.  Moderately elevated LVEDP of 20 mmHg with normal ejection fraction.   DG Chest Port 1 View  Result Date: 10/21/2020 CLINICAL DATA:  86 year old female with shortness of breath, chest tightness. Hypertensive, systolic blood pressure greater than 200. EXAM: PORTABLE CHEST 1 VIEW COMPARISON:  Portable chest 12/06/2013 and earlier. FINDINGS: Portable AP upright view at 1123 hours. Stable right chest power port. Lung volumes and mediastinal contours are stable, within normal limits. Postoperative changes to the bilateral chest wall, axilla. Allowing for portable technique the lungs are clear. No pneumothorax or pleural effusion. No acute osseous abnormality identified. IMPRESSION: No acute cardiopulmonary abnormality. Electronically Signed    By: Genevie Ann M.D.   On: 10/21/2020 11:45   ECHOCARDIOGRAM COMPLETE  Result Date: 10/21/2020    ECHOCARDIOGRAM REPORT   Patient Name:   UNIQUE Rockholt Date of Exam: 10/21/2020 Medical Rec #:  [id number]        Height:       64.0 in Accession #:    [id number]       Weight:       165.0 lb Date of Birth:  1934/01/08        BSA:          1.803 m Patient Age:    21 years         BP:           134/65 mmHg Patient Gender: F                HR:           53 bpm. Exam Location:  [facility] Procedure: 2D Echo, Cardiac Doppler and Color Doppler Indications:    NSTEMI l21.4  History:        Patient has prior history of Echocardiogram examinations, most                 recent 03/12/2014. Risk Factors:Dyslipidemia and Hypertension.  Sonographer:    Alvino Chapel RCS Referring Phys: [id number] [place]  1. Left ventricular ejection fraction, by estimation, is 45 to 50%. The left ventricle has mildly decreased function. The left ventricle demonstrates regional wall motion abnormalities (see scoring diagram/findings for description). There is mild left ventricular hypertrophy. Left ventricular diastolic parameters are consistent with Grade I diastolic dysfunction (impaired relaxation).  2. Right ventricular systolic function is normal. The right ventricular size is normal. There is normal pulmonary artery systolic pressure. The estimated right ventricular systolic pressure is 35.5 mmHg.  3. Left atrial size was mildly dilated.  4. The mitral valve is abnormal. Mild mitral valve regurgitation.  5. The aortic valve is tricuspid. Aortic valve regurgitation is not visualized.  6. The inferior vena cava is normal in size with greater than 50% respiratory variability, suggesting right atrial pressure of 3 mmHg.  Comparison(s): No prior Echocardiogram. FINDINGS  Left Ventricle: Left ventricular ejection fraction, by estimation, is 45 to 50%. The left ventricle has mildly decreased function. The left ventricle  demonstrates regional wall motion abnormalities. The left ventricular internal cavity size was normal in size. There is mild left ventricular hypertrophy. Left ventricular diastolic parameters are consistent with Grade I diastolic dysfunction (impaired relaxation).  LV Wall Scoring: The mid and distal inferior wall, mid inferolateral segment, mid inferoseptal segment, apical septal segment, and apex are hypokinetic. The entire anterior wall, antero-lateral wall, anterior septum, basal inferolateral segment, apical lateral segment, basal inferior segment, and basal inferoseptal segment are normal. Right Ventricle: The right ventricular size is normal. No increase in right ventricular wall thickness. Right ventricular systolic function is normal. There is normal pulmonary artery systolic pressure. The tricuspid regurgitant velocity is 2.34 m/s, and  with an assumed right atrial pressure of 3 mmHg, the estimated right ventricular systolic pressure is 11.5 mmHg. Left Atrium: Left atrial size was mildly dilated. Right Atrium: Right atrial size was normal in size. Pericardium: There is no evidence of pericardial effusion. Presence of pericardial fat pad. Mitral Valve: The mitral valve is abnormal. There is mild thickening of the mitral valve leaflet(s). Mild mitral annular calcification. Mild mitral valve regurgitation. Tricuspid Valve: The tricuspid valve is grossly normal. Tricuspid valve regurgitation is mild. Aortic Valve: The aortic valve is tricuspid. There is mild aortic valve annular calcification. Aortic valve regurgitation is not visualized. Pulmonic Valve: The pulmonic valve was grossly normal. Pulmonic valve regurgitation is trivial. Aorta: The aortic root is normal in size and structure. Venous: The inferior vena cava is normal in size with greater than 50% respiratory variability, suggesting right atrial pressure of 3 mmHg. IAS/Shunts: No atrial level shunt detected by color flow Doppler.  LEFT VENTRICLE  PLAX 2D LVIDd:         4.80 cm   Diastology LVIDs:         3.30 cm   LV e' medial:    3.92 cm/s LV PW:         1.20 cm   LV E/e' medial:  20.8 LV IVS:        1.10 cm   LV e' lateral:   4.46 cm/s LVOT diam:     1.90 cm   LV E/e' lateral: 18.3 LV SV:         72 LV SV Index:   40 LVOT Area:     2.84 cm  RIGHT VENTRICLE RV S prime:     10.80 cm/s TAPSE (M-mode): 2.3 cm LEFT ATRIUM             Index        RIGHT ATRIUM           Index LA diam:        3.60 cm 2.00 cm/m   RA Area:     13.90 cm LA Vol (A2C):   61.1 ml 33.89 ml/m  RA Volume:   38.00 ml  21.08 ml/m LA Vol (A4C):   68.9 ml 38.22 ml/m LA Biplane Vol: 70.2 ml 38.94 ml/m  AORTIC VALVE LVOT Vmax:   78.30 cm/s LVOT Vmean:  47.500 cm/s LVOT VTI:    0.253 m  AORTA Ao Root diam: 3.70 cm MITRAL VALVE                TRICUSPID VALVE MV Area (PHT): 2.20 cm     TR Peak grad:   21.9 mmHg MV Decel Time:  345 msec     TR Vmax:        234.00 cm/s MV E velocity: 81.40 cm/s MV A velocity: 120.00 cm/s  SHUNTS MV E/A ratio:  0.68         Systemic VTI:  0.25 m                             Systemic Diam: 1.90 cm Rozann Lesches MD Electronically signed by Rozann Lesches MD Signature Date/Time: 10/21/2020/5:14:33 PM    Final    Disposition   Pt is being discharged home today in good condition.  Follow-up Plans & Appointments     Follow-up Information     Verta Ellen., NP Follow up on 10/30/2020.   Specialty: Cardiology Why: @11am  for hospital follow up Contact information: [place] [id number] [phone]                Discharge Instructions     Diet - low sodium heart healthy   Complete by: As directed    Discharge instructions   Complete by: As directed    No driving for 48 hours. No lifting over 5 lbs for 1 week. No sexual activity for 1 week. Keep procedure site clean & dry. If you notice increased pain, swelling, bleeding or pus, call/return!  You may shower, but no soaking baths/hot tubs/pools for 1 week.    Increase activity slowly   Complete by: As directed        Discharge Medications   Allergies as of 10/22/2020   No Known Allergies      Medication List     STOP taking these medications    atenolol 50 MG tablet Commonly known as: TENORMIN   potassium chloride SA 20 MEQ tablet Commonly known as: KLOR-CON       TAKE these medications    carvedilol 6.25 MG tablet Commonly known as: COREG Take 1 tablet (6.25 mg total) by mouth 2 (two) times daily with a meal.   cloNIDine 0.1 MG tablet Commonly known as: CATAPRES Take 1 tablet (0.1 mg total) by mouth 2 (two) times daily. What changed:  when to take this reasons to take this   LORazepam 0.5 MG tablet Commonly known as: ATIVAN Take 0.5 mg by mouth every 8 (eight) hours as needed for anxiety.   losartan 100 MG tablet Commonly known as: COZAAR Take 100 mg by mouth daily.           Outstanding Labs/Studies   BMP at follow up   Duration of Discharge Encounter   Greater than 30 minutes including physician time.  Jarrett Soho, PA 10/22/2020, 10:21 AM

## 2020-10-22 NOTE — Plan of Care (Signed)
  Problem: Clinical Measurements: Goal: Ability to maintain clinical measurements within normal limits will improve Outcome: Adequate for Discharge Goal: Will remain free from infection Outcome: Adequate for Discharge Goal: Diagnostic test results will improve Outcome: Adequate for Discharge Goal: Respiratory complications will improve Outcome: Adequate for Discharge Goal: Cardiovascular complication will be avoided Outcome: Adequate for Discharge   Problem: Activity: Goal: Risk for activity intolerance will decrease Outcome: Adequate for Discharge   Problem: Nutrition: Goal: Adequate nutrition will be maintained Outcome: Adequate for Discharge   Problem: Coping: Goal: Level of anxiety will decrease Outcome: Adequate for Discharge   Problem: Elimination: Goal: Will not experience complications related to bowel motility Outcome: Adequate for Discharge Goal: Will not experience complications related to urinary retention Outcome: Adequate for Discharge   Problem: Pain Managment: Goal: General experience of comfort will improve Outcome: Adequate for Discharge   Problem: Safety: Goal: Ability to remain free from injury will improve Outcome: Adequate for Discharge   Problem: Skin Integrity: Goal: Risk for impaired skin integrity will decrease Outcome: Adequate for Discharge   

## 2020-10-24 NOTE — Progress Notes (Signed)
[facility] [address] [place] [address]   Patient Care Team: Lemmie Evens, MD as PCP - General (Family Medicine) Satira Sark, MD as PCP - Cardiology (Cardiology) Marin Olp Rudell Cobb, MD as Consulting Physician (Oncology) Aviva Signs, MD as Consulting Physician (General Surgery)  SUMMARY OF ONCOLOGIC HISTORY: Oncology History  Breast cancer ([place])  07/25/2013 Imaging   Plain film of the lumbar spine, complete 4 view. Chronic lumbar degenerative change most prominent at L4-5. No acute abnormality and no change from prior study   10/21/2013 Initial Diagnosis   Left breast cancer, core biopsy, ER negative, PR negative, HER-2/neu negative, Ki-67 of 13%, infiltrating duct cell   11/04/2013 Surgery   Left modified radical mastectomy, 1.8 cm infiltrating duct cell carcinoma, ER negative, PR negative, HER-2/neu not overexpressed, 9 lymph nodes negative. Stage TI cN0 M0.   12/06/2013 Imaging   Portable chest x-ray showing no pneumothorax no lung edema or consolidation Port-A-Cath tip in the SVC.   12/23/2013 - 02/03/2014 Chemotherapy   Cycle 1 of TC initiated on 12/23/2013, patient completed 3 cycles of prescirbed therapy     CHIEF COMPLIANT: Follow-up for breast cancer, ER-/PR-/HER2-   INTERVAL HISTORY: [name] is a 86 y.o. female here today for follow up of her breast cancer, ER-/PR-/HER2-. Her last visit was on 10/14/2020.   Today she reports feeling fair. She reports abdominal bloating and leg pain bilaterally. She reports intermittent constipation, and she has not had a bowel movement since 10/18. She reports ankle swellings for which she wears compression stockings.   REVIEW OF SYSTEMS:   Review of Systems  Constitutional:  Positive for fatigue (30%). Negative for appetite change (50%).  Respiratory:  Positive for shortness of breath.   Cardiovascular:  Positive for leg swelling.  Gastrointestinal:  Positive for abdominal distention and  constipation.  All other systems reviewed and are negative.  I have reviewed the past medical history, past surgical history, social history and family history with the patient and they are unchanged from previous note.   ALLERGIES:   has No Known Allergies.   MEDICATIONS:  Current Outpatient Medications  Medication Sig Dispense Refill   carvedilol (COREG) 6.25 MG tablet Take 1 tablet (6.25 mg total) by mouth 2 (two) times daily with a meal. 60 tablet 6   cloNIDine (CATAPRES) 0.1 MG tablet Take 1 tablet (0.1 mg total) by mouth 2 (two) times daily. 60 tablet 3   LORazepam (ATIVAN) 0.5 MG tablet Take 0.5 mg by mouth every 8 (eight) hours as needed for anxiety.     losartan (COZAAR) 100 MG tablet Take 100 mg by mouth daily.     No current facility-administered medications for this visit.   Facility-Administered Medications Ordered in Other Visits  Medication Dose Route Frequency Provider Last Rate Last Admin   sodium chloride 0.9 % injection 10 mL  10 mL Intravenous PRN Kurtis Bushman A, RN   10 mL at 08/26/14 1333   sodium chloride flush (NS) 0.9 % injection 10 mL  10 mL Intravenous PRN Penland, Kelby Fam, MD   10 mL at 11/02/15 1117   sodium chloride flush (NS) 0.9 % injection 10 mL  10 mL Intravenous PRN Holley Bouche, NP   10 mL at 08/16/16 1125     PHYSICAL EXAMINATION: Performance status (ECOG): 1 - Symptomatic but completely ambulatory  There were no vitals filed for this visit. Wt Readings from Last 3 Encounters:  10/22/20 159 lb 2.8 oz (72.2  kg)  03/07/20 163 lb (73.9 kg)  03/05/20 163 lb (73.9 kg)   Physical Exam Vitals reviewed.  Constitutional:      Appearance: Normal appearance.  Cardiovascular:     Rate and Rhythm: Normal rate and regular rhythm.     Pulses: Normal pulses.     Heart sounds: Normal heart sounds.  Pulmonary:     Effort: Pulmonary effort is normal.     Breath sounds: Normal breath sounds.  Chest:  Breasts:    Right: Absent.     Left:  Absent.  Lymphadenopathy:     Upper Body:     Right upper body: No supraclavicular, axillary or pectoral adenopathy.     Left upper body: No supraclavicular, axillary or pectoral adenopathy.  Neurological:     General: No focal deficit present.     Mental Status: She is alert and oriented to person, place, and time.  Psychiatric:        Mood and Affect: Mood normal.        Behavior: Behavior normal.    Breast Exam Chaperone: Thana Ates     LABORATORY DATA:  I have reviewed the data as listed CMP Latest Ref Rng & Units 10/22/2020 10/21/2020 10/19/2020  Glucose 70 - 99 mg/dL 114(H) 144(H) 105(H)  BUN 8 - 23 mg/dL _0 Creatinine 0.44 - 1.00 mg/dL 0.81 0.81 0.71  Sodium 135 - 145 mmol/L 139 138 141  Potassium 3.5 - 5.1 mmol/L 3.2(L) 3.1(L) 3.4(L)  Chloride 98 - 111 mmol/L 104 105 106  CO2 22 - 32 mmol/L _1 Calcium 8.9 - 10.3 mg/dL 8.3(L) 8.3(L) 8.8(L)  Total Protein 6.5 - 8.1 g/dL - 6.1(L) 6.6  Total Bilirubin 0.3 - 1.2 mg/dL - 0.9 0.5  Alkaline Phos 38 - 126 U/L - 65 67  AST 15 - 41 U/L - 29 16  ALT 0 - 44 U/L - 16 14   Lab Results  Component Value Date   CAN153 23.5 10/19/2020   CAN153 18.6 10/15/2019   CAN153 20.7 04/15/2019   Lab Results  Component Value Date   WBC 7.4 10/22/2020   HGB 14.0 10/22/2020   HCT 42.5 10/22/2020   MCV 89.5 10/22/2020   PLT 192 10/22/2020   NEUTROABS 5.4 10/21/2020    ASSESSMENT:  1.  Stage I (T1CN0) triple negative left breast cancer: -Status post left mastectomy Lymph node dissection on 11/04/2013, 1.8 cm IDC, 0/9 lymph nodes positive. -Status post 3 cycles of TC in adjuvant setting, fourth cycle not given due to fatigue and neuropathy, completed on 02/03/2014. -Genetic testing negative in April 2016.   2.  Right breast cancer: - She has remote history of right breast cancer and underwent right mastectomy.   3.  Bilateral lymphedema: -She has lymphedema in both upper extremities, right more than left. -She also has  bilateral lower extremity edema.     PLAN:  1.  Stage I (T1CN0) triple negative left breast cancer: - Bilateral mastectomy sites are within normal limits.  No palpable adenopathy. - Reviewed labs from 10/19/2020 which showed normal LFTs and creatinine.  CBC was normal.  CA 15-3 was 23.5. - RTC 1 year for follow-up.      Breast Cancer therapy associated bone loss: I have recommended calcium, Vitamin D and weight bearing exercises.  Orders placed this encounter:  No orders of the defined types were placed in this encounter.   The patient has a good understanding of the overall plan. She  agrees with it. She will call with any problems that may develop before the next visit here.  Derek Jack, MD [place] [phone]   I, Thana Ates, am acting as a scribe for Dr. Derek Jack.  I, Derek Jack MD, have reviewed the above documentation for accuracy and completeness, and I agree with the above.

## 2020-10-26 ENCOUNTER — Other Ambulatory Visit: Payer: Self-pay

## 2020-10-26 ENCOUNTER — Inpatient Hospital Stay (HOSPITAL_BASED_OUTPATIENT_CLINIC_OR_DEPARTMENT_OTHER): Payer: Medicare Other | Admitting: Hematology

## 2020-10-26 VITALS — BP 145/81 | HR 84 | Temp 98.4°F | Resp 18 | Wt 163.4 lb

## 2020-10-26 DIAGNOSIS — Z171 Estrogen receptor negative status [ER-]: Secondary | ICD-10-CM

## 2020-10-26 DIAGNOSIS — C50512 Malignant neoplasm of lower-outer quadrant of left female breast: Secondary | ICD-10-CM

## 2020-10-26 DIAGNOSIS — Z853 Personal history of malignant neoplasm of breast: Secondary | ICD-10-CM | POA: Diagnosis not present

## 2020-10-26 NOTE — Patient Instructions (Signed)
[place] at [facility] Discharge Instructions  You were seen and examined today by Dr. Delton Coombes. He reviewed your most recent labs and everything looked good. Please follow up as scheduled.   Thank you for choosing [place] at [facility] to provide your oncology and hematology care.  To afford each patient quality time with our provider, please arrive at least 15 minutes before your scheduled appointment time.   If you have a lab appointment with the [facility] please come in thru the Main Entrance and check in at the main information desk.  You need to re-schedule your appointment should you arrive 10 or more minutes late.  We strive to give you quality time with our providers, and arriving late affects you and other patients whose appointments are after yours.  Also, if you no show three or more times for appointments you may be dismissed from the clinic at the providers discretion.     Again, thank you for choosing [facility].  Our hope is that these requests will decrease the amount of time that you wait before being seen by our physicians.       _____________________________________________________________  Should you have questions after your visit to [facility], please contact our office at [phone] and follow the prompts.  Our office hours are 8:00 a.m. and 4:30 p.m. Monday - Friday.  Please note that voicemails left after 4:00 p.m. may not be returned until the following business day.  We are closed weekends and major holidays.  You do have access to a nurse 24-7, just call the main number to the clinic [phone] and do not press any options, hold on the line and a nurse will answer the phone.    For prescription refill requests, have your pharmacy contact our office and allow 72 hours.    Due to Covid, you will need to wear a mask upon entering the hospital. If you do not have a mask, a mask will  be given to you at the Main Entrance upon arrival. For doctor visits, patients may have 1 support person age 74 or older with them. For treatment visits, patients can not have anyone with them due to social distancing guidelines and our immunocompromised population.

## 2020-10-26 NOTE — Telephone Encounter (Signed)
Patient contacted regarding discharge from [facility] on 10/22/2020.  Patient understands to follow up with provider Ayesha Rumpf on 10/30/20 at 11:00 am at [place]. Patient understands her discharge instructions. Patient understands her medications and regimen. Patient understands to bring all of her medications to this visit.

## 2020-10-29 NOTE — Progress Notes (Addendum)
Cardiology Office Note  Date: 10/30/2020   ID: [name], DOB 16-Aug-1934, MRN [id number]  PCP:  Lemmie Evens, MD  Cardiologist:  Rozann Lesches, MD Electrophysiologist:  None   Chief Complaint: Hospital follow-up NSTEMI  History of Present Illness: Tiffany Sweeney is a 86 y.o. female with a history of HTN, HLD.  Patient recently presented to [facility] ED for evaluation of chest pain shortness of breath.  Patient complained of worsening shortness of breath/DOE over the prior several weeks.  The following day she reported a pressure along her sternal area which did not radiate but blood pressure was elevated in the systolic range of 622.  She had no nausea, vomiting, or diaphoresis.  She was transferred to [facility] for cardiac catheterization.  She had an echocardiogram which showed EF of 45 to 50%, G1 DD, normal RV function, RVSP 24.9 mmHg.  She was taken to Cath Lab which showed mild nonobstructive disease.  Mildly elevated LVEDP at 20 mmHg with normal ejection fraction.  Her atenolol was held prior to transfer secondary to bradycardia in the 50s.  She was observed overnight and had no recurrent chest pain or shortness of breath.  Her heart rate improved to the 80s.  She was taking twice daily clonidine instead of as needed.  She was started on carvedilol 6.25 mg p.o. twice daily.  She was continuing twice daily dose of clonidine.  Down titration or discontinuation was recommended in the outpatient setting.  She was to continue losartan 100 mg daily.  If blood pressure remained elevated could consider adding amlodipine.  She was to keep track of her blood pressure and heart rate and bring readings for evaluation with blood pressure cuff.  She is here for follow-up status post NSTEMI.  She denies any issues as a result of the cardiac catheterization.  Her right radial access site has bruised and slightly swollen and ecchymotic.  She does have good 2+ pulses.  She denies  any issues related.  She does states she has some numbness and tingling in her left hand occasionally.  She denies any recurrent chest pain or shortness of breath.  Her blood pressure today is 134/72.  Discharge note stated down to titration or discontinuation of clonidine was recommended.  She was to continue all other antihypertensive medications.  She brings with her a log of blood blood pressures which seem to be running in the 297/989 systolic range.  We discussed discontinuation of clonidine and initiation of amlodipine.  We discussed goal blood pressure is 130/80 or less consistently.  She states she has been having some lower extremity edema and is wearing compression hose today.  Current cardiac regimen status post PCI include; carvedilol 6.25 mg p.o. twice daily, clonidine 0.1 mg p.o. twice daily, losartan 100 mg daily.   Past Medical History:  Diagnosis Date   Anxiety    Arthritis    Breast cancer ([place])    Essential hypertension    Family history of colon cancer    Family history of ovarian cancer    Family history of pancreatic cancer    High cholesterol    Hyperlipidemia    Skin cancer    Patient stated  she has low grade skin cancer in right hand    Past Surgical History:  Procedure Laterality Date   ABDOMINAL HYSTERECTOMY     CATARACT EXTRACTION W/PHACO Left 03/11/2013   Procedure: CATARACT EXTRACTION PHACO AND INTRAOCULAR LENS PLACEMENT ([place]);  Surgeon: Tonny Branch, MD;  Location: [facility];  Service: Ophthalmology;  Laterality: Left;  CDE:7.54   CATARACT EXTRACTION W/PHACO Right 04/15/2013   Procedure: CATARACT EXTRACTION PHACO AND INTRAOCULAR LENS PLACEMENT ([facility]);  Surgeon: Tonny Branch, MD;  Location: [facility];  Service: Ophthalmology;  Laterality: Right;  CDE 9.38   LEFT HEART CATH AND CORONARY ANGIOGRAPHY N/A 10/21/2020   Procedure: LEFT HEART CATH AND CORONARY ANGIOGRAPHY;  Surgeon: Early Osmond, MD;  Location: [facility];  Service: Cardiovascular;  Laterality: N/A;    MASTECTOMY Right    MASTECTOMY MODIFIED RADICAL Left 11/04/2013   Procedure: LEFT MASTECTOMY MODIFIED RADICAL;  Surgeon: Jamesetta So, MD;  Location: [facility];  Service: General;  Laterality: Left;   PORTACATH PLACEMENT Right 12/06/2013   Procedure: INSERTION PORT-A-CATH-Right subclavian;  Surgeon: Jamesetta So, MD;  Location: [facility];  Service: General;  Laterality: Right;   TOTAL HIP ARTHROPLASTY Right 08/05/2015   Procedure: RIGHT TOTAL HIP ARTHROPLASTY ANTERIOR APPROACH;  Surgeon: Gaynelle Arabian, MD;  Location: [facility];  Service: Orthopedics;  Laterality: Right;    Current Outpatient Medications  Medication Sig Dispense Refill   amLODipine (NORVASC) 5 MG tablet Take 1 tablet (5 mg total) by mouth daily. 90 tablet 1   carvedilol (COREG) 6.25 MG tablet Take 1 tablet (6.25 mg total) by mouth 2 (two) times daily with a meal. 60 tablet 6   furosemide (LASIX) 20 MG tablet Take 1 tablet (20 mg total) by mouth daily as needed (for weight gain of 3 lbs/day or 5 lbs/week, or swelling). 30 tablet 1   LORazepam (ATIVAN) 0.5 MG tablet Take 0.5 mg by mouth every 8 (eight) hours as needed for anxiety.     losartan (COZAAR) 100 MG tablet Take 100 mg by mouth daily.     No current facility-administered medications for this visit.   Facility-Administered Medications Ordered in Other Visits  Medication Dose Route Frequency Provider Last Rate Last Admin   sodium chloride 0.9 % injection 10 mL  10 mL Intravenous PRN Kurtis Bushman A, RN   10 mL at 08/26/14 1333   sodium chloride flush (NS) 0.9 % injection 10 mL  10 mL Intravenous PRN Penland, Kelby Fam, MD   10 mL at 11/02/15 1117   sodium chloride flush (NS) 0.9 % injection 10 mL  10 mL Intravenous PRN Holley Bouche, NP   10 mL at 08/16/16 1125   Allergies:  Statins   Social History: The patient  reports that she quit smoking about 35 years ago. Her smoking use included cigarettes. She started smoking about 64 years ago. She has a 7.50 pack-year  smoking history. She has never used smokeless tobacco. She reports that she does not drink alcohol and does not use drugs.   Family History: The patient's family history includes Cancer in her cousin; Colon cancer in her cousin; Dementia in her father; Diabetes in her mother; Lung cancer (age of onset: 40) in her sister; Ovarian cancer (age of onset: 50) in her sister; Pancreatic cancer (age of onset: 60) in her brother; Rectal cancer (age of onset: 55) in her mother.   ROS:  Please see the history of present illness. Otherwise, complete review of systems is positive for none.  All other systems are reviewed and negative.   Physical Exam: VS:  BP 134/72   Pulse 65   Ht 5\' 4"  (1.626 m)   Wt 165 lb 6.4 oz (75 kg)   SpO2 98%   BMI 28.39 kg/m , BMI Body mass  index is 28.39 kg/m.  Wt Readings from Last 3 Encounters:  10/30/20 165 lb 6.4 oz (75 kg)  10/26/20 163 lb 6.4 oz (74.1 kg)  10/22/20 159 lb 2.8 oz (72.2 kg)    General: Patient appears comfortable at rest. Neck: Supple, no elevated JVP or carotid bruits, no thyromegaly. Lungs: Clear to auscultation, nonlabored breathing at rest. Cardiac: Regular rate and rhythm, no S3 or significant systolic murmur, no pericardial rub. Extremities: No pitting edema, distal pulses 2+. Skin: Warm and dry. Musculoskeletal: No kyphosis. Neuropsychiatric: Alert and oriented x3, affect grossly appropriate.  ECG:  EKG 10/22/2019 at [facility] sinus rhythm rate of 66, LVH, abnormal T, probable ischemia, widespread, ST elevation, consider inferior injury, prolonged QT: QT/QTc 523/549  Recent Labwork: 03/05/2020: Magnesium 1.9 10/21/2020: ALT 16; AST 29 10/22/2020: BUN 9; Creatinine, Ser 0.81; Hemoglobin 14.0; Platelets 192; Potassium 3.2; Sodium 139     Component Value Date/Time   CHOL 224 (H) 10/22/2020 0615   TRIG 122 10/22/2020 0615   HDL 48 10/22/2020 0615   CHOLHDL 4.7 10/22/2020 0615   VLDL 24 10/22/2020 0615   LDLCALC 152 (H) 10/22/2020 0615     Other Studies Reviewed Today:   Echo 10/21/2020 1. Left ventricular ejection fraction, by estimation, is 45 to 50%. The  left ventricle has mildly decreased function. The left ventricle  demonstrates regional wall motion abnormalities (see scoring  diagram/findings for description). There is mild left  ventricular hypertrophy. Left ventricular diastolic parameters are  consistent with Grade I diastolic dysfunction (impaired relaxation).   2. Right ventricular systolic function is normal. The right ventricular  size is normal. There is normal pulmonary artery systolic pressure. The  estimated right ventricular systolic pressure is 66.0 mmHg.   3. Left atrial size was mildly dilated.   4. The mitral valve is abnormal. Mild mitral valve regurgitation.   5. The aortic valve is tricuspid. Aortic valve regurgitation is not  visualized.   6. The inferior vena cava is normal in size with greater than 50%  respiratory variability, suggesting right atrial pressure of 3 mmHg.   10/21/2020   LEFT HEART CATH AND CORONARY ANGIOGRAPHY  10/21/2020    Conclusion       Prox RCA lesion is 20% stenosed.   Mid RCA lesion is 30% stenosed.   LV end diastolic pressure is moderately elevated.   1.  Mild obstructive coronary artery disease. 2.  Moderately elevated LVEDP of 20 mmHg with normal ejection fraction.       Assessment and Plan:  1. NSTEMI (non-ST elevated myocardial infarction) ([place])   2. Essential hypertension   3. Mixed hyperlipidemia   4. Lower extremity edema    1. NSTEMI (non-ST elevated myocardial infarction) [facility]) Status post NSTEMI with mild nonobstructive disease via cardiac catheterization.  See report above.  No current anginal or exertional symptoms.  2. Essential hypertension Blood pressure today is 134/72.  Discharge notes recommended discontinuation of clonidine and starting amlodipine if blood pressure continues to be elevated.  She has a log of blood pressures  from home with systolics appearing to range in the 100-150 range.  Discontinue clonidine and start amlodipine 5 mg p.o. daily.  Check her blood pressures for the next 2 weeks and call us in 2 weeks with update on blood pressures.  3.  Hyperlipidemia Recent lipids showed total cholesterol of 224, triglycerides 122, HDL 48, LDL 152.  LDL has improved some since previous lab work when LDL was 165.  She apparently has allergies  to statin medications.  May need to refer to lipid clinic.  Will discuss at follow-up.  4.  Lower extremity edema She has some lower extremity edema and is wearing compression stockings.  Please start Lasix 20 mg as needed for weight gain 3 pounds in 24 hours or 5 pounds in a week and as needed for swelling.  Medication Adjustments/Labs and Tests Ordered: Current medicines are reviewed at length with the patient today.  Concerns regarding medicines are outlined above.   Disposition: Follow-up with Dr. Domenic Polite or APP 3 months  Signed, Levell July, NP 10/30/2020 11:55 AM    [facility] [place], [place], [place] [address] Phone: [phone]; Fax: [phone]

## 2020-10-30 ENCOUNTER — Ambulatory Visit (INDEPENDENT_AMBULATORY_CARE_PROVIDER_SITE_OTHER): Payer: Medicare Other | Admitting: Family Medicine

## 2020-10-30 ENCOUNTER — Other Ambulatory Visit: Payer: Self-pay

## 2020-10-30 ENCOUNTER — Encounter: Payer: Self-pay | Admitting: Family Medicine

## 2020-10-30 VITALS — BP 134/72 | HR 65 | Ht 64.0 in | Wt 165.4 lb

## 2020-10-30 DIAGNOSIS — I214 Non-ST elevation (NSTEMI) myocardial infarction: Secondary | ICD-10-CM

## 2020-10-30 DIAGNOSIS — R600 Localized edema: Secondary | ICD-10-CM

## 2020-10-30 DIAGNOSIS — E782 Mixed hyperlipidemia: Secondary | ICD-10-CM

## 2020-10-30 DIAGNOSIS — I10 Essential (primary) hypertension: Secondary | ICD-10-CM | POA: Diagnosis not present

## 2020-10-30 MED ORDER — AMLODIPINE BESYLATE 5 MG PO TABS: 5.0000 mg | ORAL_TABLET | Freq: Every day | ORAL | 1 refills | Status: DC

## 2020-10-30 MED ORDER — FUROSEMIDE 20 MG PO TABS: 20.0000 mg | ORAL_TABLET | Freq: Every day | ORAL | 1 refills | Status: DC | PRN

## 2020-10-30 NOTE — Patient Instructions (Addendum)
Medication Instructions:  Your physician has recommended you make the following change in your medication:  Stop clonidine Start amlodipine 5 mg daily Start furosemide 20 mg daily as needed for weight gain of 3 lbs/day or 5 lbs/week or swelling Continue other medications the same  Labwork: none  Testing/Procedures: none  Follow-Up: Your physician recommends that you schedule a follow-up appointment in: 3 months  Any Other Special Instructions Will Be Listed Below (If Applicable).  If you need a refill on your cardiac medications before your next appointment, please call your pharmacy.

## 2020-11-16 ENCOUNTER — Telehealth: Payer: Self-pay | Admitting: Family Medicine

## 2020-11-16 MED ORDER — CARVEDILOL 6.25 MG PO TABS: 6.2500 mg | ORAL_TABLET | Freq: Two times a day (BID) | ORAL | 6 refills | Status: DC

## 2020-11-16 NOTE — Telephone Encounter (Signed)
Complete

## 2020-11-16 NOTE — Telephone Encounter (Signed)
*  STAT* If patient is at the pharmacy, call can be transferred to refill team.   1. Which medications need to be refilled? (please list name of each medication and dose if known)  carvedilol (COREG) 6.25 MG tablet  2. Which pharmacy/location (including street and city if local pharmacy) is medication to be sent to? [place]  3. Do they need a 30 day or 90 day supply? 90 day supply

## 2021-01-19 ENCOUNTER — Other Ambulatory Visit: Payer: Self-pay

## 2021-01-19 ENCOUNTER — Inpatient Hospital Stay (HOSPITAL_COMMUNITY): Payer: Medicare Other | Attending: Hematology

## 2021-01-19 DIAGNOSIS — Z452 Encounter for adjustment and management of vascular access device: Secondary | ICD-10-CM | POA: Diagnosis present

## 2021-01-19 DIAGNOSIS — Z853 Personal history of malignant neoplasm of breast: Secondary | ICD-10-CM | POA: Diagnosis present

## 2021-01-19 MED ORDER — SODIUM CHLORIDE 0.9% FLUSH
10.0000 mL | INTRAVENOUS | Status: DC | PRN
  Administered 2021-01-19: 10 mL via INTRAVENOUS

## 2021-01-19 MED ORDER — HEPARIN SOD (PORK) LOCK FLUSH 100 UNIT/ML IV SOLN
500.0000 [IU] | Freq: Once | INTRAVENOUS | Status: AC
  Administered 2021-01-19: 500 [IU] via INTRAVENOUS

## 2021-01-19 NOTE — Patient Instructions (Signed)
[facility]  Discharge Instructions: ?Thank you for choosing [facility] to provide your oncology and hematology care.  ?If you have a lab appointment with the Cancer Center, please come in thru the Main Entrance and check in at the main information desk. ? ?Wear comfortable clothing and clothing appropriate for easy access to any Portacath or PICC line.  ? ?We strive to give you quality time with your provider. You may need to reschedule your appointment if you arrive late (15 or more minutes).  Arriving late affects you and other patients whose appointments are after yours.  Also, if you miss three or more appointments without notifying the office, you may be dismissed from the clinic at the provider?s discretion.    ?  ?For prescription refill requests, have your pharmacy contact our office and allow 72 hours for refills to be completed.   ? ?Today you received port flush ?  ? ?BELOW ARE SYMPTOMS THAT SHOULD BE REPORTED IMMEDIATELY: ?*FEVER GREATER THAN 100.4 F (38 ?C) OR HIGHER ?*CHILLS OR SWEATING ?*NAUSEA AND VOMITING THAT IS NOT CONTROLLED WITH YOUR NAUSEA MEDICATION ?*UNUSUAL SHORTNESS OF BREATH ?*UNUSUAL BRUISING OR BLEEDING ?*URINARY PROBLEMS (pain or burning when urinating, or frequent urination) ?*BOWEL PROBLEMS (unusual diarrhea, constipation, pain near the anus) ?TENDERNESS IN MOUTH AND THROAT WITH OR WITHOUT PRESENCE OF ULCERS (sore throat, sores in mouth, or a toothache) ?UNUSUAL RASH, SWELLING OR PAIN  ?UNUSUAL VAGINAL DISCHARGE OR ITCHING  ? ?Items with * indicate a potential emergency and should be followed up as soon as possible or go to the Emergency Department if any problems should occur. ? ?Please show the CHEMOTHERAPY ALERT CARD or IMMUNOTHERAPY ALERT CARD at check-in to the Emergency Department and triage nurse. ? ?Should you have questions after your visit or need to cancel or reschedule your appointment, please contact [facility] [phone]  and  follow the prompts.  Office hours are 8:00 a.m. to 4:30 p.m. Monday - Friday. Please note that voicemails left after 4:00 p.m. may not be returned until the following business day.  We are closed weekends and major holidays. You have access to a nurse at all times for urgent questions. Please call the main number to the clinic [phone] and follow the prompts. ? ?For any non-urgent questions, you may also contact your provider using MyChart. We now offer e-Visits for anyone 18 and older to request care online for non-urgent symptoms. For details visit [email or web address]. ?  ?Also download the MyChart app! Go to the app store, search "MyChart", open the app, select [place], and log in with your MyChart username and password. ? ?Due to Covid, a mask is required upon entering the hospital/clinic. If you do not have a mask, one will be given to you upon arrival. For doctor visits, patients may have 1 support person aged 18 or older with them. For treatment visits, patients cannot have anyone with them due to current Covid guidelines and our immunocompromised population.  ?

## 2021-01-19 NOTE — Progress Notes (Signed)
Tiffany Sweeney presented for Portacath access and flush.  Portacath located right chest wall accessed with  H 20 needle.  Good blood return present. Portacath flushed with 78ml NS and 500U/30ml Heparin and needle removed intact.  Procedure tolerated well and without incident.      Discharged from clinic ambulatory in stable condition. Alert and oriented x 3. F/U with [facility] as scheduled.

## 2021-02-04 ENCOUNTER — Ambulatory Visit (INDEPENDENT_AMBULATORY_CARE_PROVIDER_SITE_OTHER): Payer: Medicare Other | Admitting: Cardiology

## 2021-02-04 ENCOUNTER — Encounter: Payer: Self-pay | Admitting: Cardiology

## 2021-02-04 ENCOUNTER — Other Ambulatory Visit: Payer: Self-pay

## 2021-02-04 VITALS — BP 104/68 | HR 71 | Ht 64.0 in

## 2021-02-04 DIAGNOSIS — E782 Mixed hyperlipidemia: Secondary | ICD-10-CM

## 2021-02-04 DIAGNOSIS — I5020 Unspecified systolic (congestive) heart failure: Secondary | ICD-10-CM

## 2021-02-04 DIAGNOSIS — M791 Myalgia, unspecified site: Secondary | ICD-10-CM | POA: Diagnosis not present

## 2021-02-04 DIAGNOSIS — T466X5A Adverse effect of antihyperlipidemic and antiarteriosclerotic drugs, initial encounter: Secondary | ICD-10-CM

## 2021-02-04 DIAGNOSIS — I25119 Atherosclerotic heart disease of native coronary artery with unspecified angina pectoris: Secondary | ICD-10-CM

## 2021-02-04 NOTE — Progress Notes (Signed)
Cardiology Office Note  Date: 02/04/2021   ID: Tiffany Sweeney, DOB April 10, 1934, MRN [id number]  PCP:  Lemmie Evens, MD  Cardiologist:  Rozann Lesches, MD Electrophysiologist:  None   Chief Complaint  Patient presents with   Cardiac follow-up    History of Present Illness: Tiffany Sweeney is an 87 y.o. female last seen in October 2022 by Mr. Leonides Sake NP.  She is here for a follow-up visit.  She states that she is back to baseline, no recurrent chest pain or shortness of breath with typical activities.  She enjoys tai chi, also chair exercises.  Cardiac testing including echocardiogram and cardiac catheterization from October 2022 outlined below.  She had mild coronary atherosclerosis at that time, peak high-sensitivity troponin I of 5300, cardiomyopathy with mild LV dysfunction and wall motion abnormalities.  Potentially stress-induced cardiomyopathy versus myocarditis.  I reviewed this with her today.  We went over her current medications which are listed below.  She does not report any intolerances recently.  No orthopnea or PND.  Weight is also stable.  Past Medical History:  Diagnosis Date   Anxiety    Arthritis    Breast cancer ([place])    Cardiomyopathy ([place])    Coronary atherosclerosis    Mild, nonobstructive - cardiac catheterization October 2022   Essential hypertension    Family history of colon cancer    Family history of ovarian cancer    Family history of pancreatic cancer    Hyperlipidemia    Skin cancer    Patient stated  she has low grade skin cancer in right hand    Past Surgical History:  Procedure Laterality Date   ABDOMINAL HYSTERECTOMY     CATARACT EXTRACTION W/PHACO Left 03/11/2013   Procedure: CATARACT EXTRACTION PHACO AND INTRAOCULAR LENS PLACEMENT ([place]);  Surgeon: Tonny Branch, MD;  Location: [facility];  Service: Ophthalmology;  Laterality: Left;  CDE:7.54   CATARACT EXTRACTION W/PHACO Right 04/15/2013   Procedure: CATARACT EXTRACTION PHACO AND  INTRAOCULAR LENS PLACEMENT ([facility]);  Surgeon: Tonny Branch, MD;  Location: [facility];  Service: Ophthalmology;  Laterality: Right;  CDE 9.38   LEFT HEART CATH AND CORONARY ANGIOGRAPHY N/A 10/21/2020   Procedure: LEFT HEART CATH AND CORONARY ANGIOGRAPHY;  Surgeon: Early Osmond, MD;  Location: [facility];  Service: Cardiovascular;  Laterality: N/A;   MASTECTOMY Right    MASTECTOMY MODIFIED RADICAL Left 11/04/2013   Procedure: LEFT MASTECTOMY MODIFIED RADICAL;  Surgeon: Jamesetta So, MD;  Location: [facility];  Service: General;  Laterality: Left;   PORTACATH PLACEMENT Right 12/06/2013   Procedure: INSERTION PORT-A-CATH-Right subclavian;  Surgeon: Jamesetta So, MD;  Location: [facility];  Service: General;  Laterality: Right;   TOTAL HIP ARTHROPLASTY Right 08/05/2015   Procedure: RIGHT TOTAL HIP ARTHROPLASTY ANTERIOR APPROACH;  Surgeon: Gaynelle Arabian, MD;  Location: [facility];  Service: Orthopedics;  Laterality: Right;    Current Outpatient Medications  Medication Sig Dispense Refill   amLODipine (NORVASC) 5 MG tablet Take 1 tablet (5 mg total) by mouth daily. 90 tablet 1   carvedilol (COREG) 6.25 MG tablet Take 1 tablet (6.25 mg total) by mouth 2 (two) times daily with a meal. 60 tablet 6   furosemide (LASIX) 20 MG tablet Take 1 tablet (20 mg total) by mouth daily as needed (for weight gain of 3 lbs/day or 5 lbs/week, or swelling). 30 tablet 1   LORazepam (ATIVAN) 0.5 MG tablet Take 0.5 mg by mouth every 8 (eight) hours as needed for anxiety.  losartan (COZAAR) 100 MG tablet Take 100 mg by mouth daily.     No current facility-administered medications for this visit.   Facility-Administered Medications Ordered in Other Visits  Medication Dose Route Frequency Provider Last Rate Last Admin   sodium chloride 0.9 % injection 10 mL  10 mL Intravenous PRN Kurtis Bushman A, RN   10 mL at 08/26/14 1333   sodium chloride flush (NS) 0.9 % injection 10 mL  10 mL Intravenous PRN Patrici Ranks, MD   10  mL at 11/02/15 1117   sodium chloride flush (NS) 0.9 % injection 10 mL  10 mL Intravenous PRN Holley Bouche, NP   10 mL at 08/16/16 1125   Allergies:  Statins   ROS: No palpitations or syncope.  Physical Exam: VS:  BP 104/68    Pulse 71    Ht 5' 4"  (1.626 m)    SpO2 99%    BMI 28.39 kg/m , BMI Body mass index is 28.39 kg/m.  Wt Readings from Last 3 Encounters:  10/30/20 165 lb 6.4 oz (75 kg)  10/26/20 163 lb 6.4 oz (74.1 kg)  10/22/20 159 lb 2.8 oz (72.2 kg)    General: Patient appears comfortable at rest. HEENT: Conjunctiva and lids normal, wearing a mask. Neck: No elevated JVP. Lungs: Clear to auscultation, nonlabored breathing at rest. Cardiac: Regular rate and rhythm, no S3 or significant systolic murmur, no pericardial rub.  ECG:  An ECG dated 10/21/2020 was personally reviewed today and demonstrated:  Sinus rhythm with deep inferior and anterolateral T wave inversions.  Recent Labwork: 03/05/2020: Magnesium 1.9 10/21/2020: ALT 16; AST 29 10/22/2020: BUN 9; Creatinine, Ser 0.81; Hemoglobin 14.0; Platelets 192; Potassium 3.2; Sodium 139     Component Value Date/Time   CHOL 224 (H) 10/22/2020 0615   TRIG 122 10/22/2020 0615   HDL 48 10/22/2020 0615   CHOLHDL 4.7 10/22/2020 0615   VLDL 24 10/22/2020 0615   LDLCALC 152 (H) 10/22/2020 0615    Other Studies Reviewed Today:  Echocardiogram 10/21/2020:  1. Left ventricular ejection fraction, by estimation, is 45 to 50%. The  left ventricle has mildly decreased function. The left ventricle  demonstrates regional wall motion abnormalities (see scoring  diagram/findings for description). There is mild left  ventricular hypertrophy. Left ventricular diastolic parameters are  consistent with Grade I diastolic dysfunction (impaired relaxation).   2. Right ventricular systolic function is normal. The right ventricular  size is normal. There is normal pulmonary artery systolic pressure. The  estimated right ventricular  systolic pressure is 36.6 mmHg.   3. Left atrial size was mildly dilated.   4. The mitral valve is abnormal. Mild mitral valve regurgitation.   5. The aortic valve is tricuspid. Aortic valve regurgitation is not  visualized.   6. The inferior vena cava is normal in size with greater than 50%  respiratory variability, suggesting right atrial pressure of 3 mmHg.   Cardiac catheterization 10/21/2020:   Prox RCA lesion is 20% stenosed.   Mid RCA lesion is 30% stenosed.   LV end diastolic pressure is moderately elevated.   1.  Mild obstructive coronary artery disease. 2.  Moderately elevated LVEDP of 20 mmHg with normal ejection fraction.  Assessment and Plan:  1.  HFrEF, secondary cardiomyopathy with mild LV dysfunction and LVEF 45 to 50% range by echocardiogram in October 2022.  Possibly stress-induced cardiomyopathy or myocarditis based on abnormal cardiac enzymes at that time and only mild coronary atherosclerosis and cardiac catheterization.  She  is symptomatically stable at this point.  Currently on Coreg, losartan, Norvasc, and as needed Lasix.  Follow-up echocardiogram will be obtained to assess need for any further medication adjustments.  2.  Nonobstructive coronary atherosclerosis.  She has a history of statin intolerance.  Can discuss further over time in terms of candidacy for willingness to try PCSK9 inhibitors or even Zetia.  3.  Essential hypertension, on Coreg, Norvasc, and losartan.  Reviewed home blood pressure checks, average systolic is in the 060R.  No changes were made today.  Medication Adjustments/Labs and Tests Ordered: Current medicines are reviewed at length with the patient today.  Concerns regarding medicines are outlined above.   Tests Ordered: Orders Placed This Encounter  Procedures   ECHOCARDIOGRAM COMPLETE    Medication Changes: No orders of the defined types were placed in this encounter.   Disposition:  Follow up  6 months.  Signed, Satira Sark, MD, [facility] 02/04/2021 1:16 PM    [facility] at [place], [place], [place] [address] Phone: [phone]; Fax: [phone]

## 2021-02-04 NOTE — Patient Instructions (Addendum)

## 2021-03-01 ENCOUNTER — Ambulatory Visit (HOSPITAL_COMMUNITY)
Admission: RE | Admit: 2021-03-01 | Discharge: 2021-03-01 | Disposition: A | Discharge status: Home / Self Care | Payer: Medicare Other | Source: Ambulatory Visit | Attending: Cardiology | Admitting: Cardiology

## 2021-03-01 DIAGNOSIS — I5020 Unspecified systolic (congestive) heart failure: Secondary | ICD-10-CM | POA: Diagnosis present

## 2021-03-01 DIAGNOSIS — I25119 Atherosclerotic heart disease of native coronary artery with unspecified angina pectoris: Secondary | ICD-10-CM

## 2021-03-01 LAB — ECHOCARDIOGRAM COMPLETE
AR max vel: 2.26 cm2
AV Area VTI: 2.8 cm2
AV Area mean vel: 2.23 cm2
AV Mean grad: 4 mmHg
AV Peak grad: 7.7 mmHg
Ao pk vel: 1.39 m/s
Area-P 1/2: 4.12 cm2
Calc EF: 57.2 %
MV VTI: 2.83 cm2
S' Lateral: 2.6 cm
Single Plane A2C EF: 59.9 %
Single Plane A4C EF: 49 %

## 2021-03-01 NOTE — Progress Notes (Signed)
*  PRELIMINARY RESULTS* Echocardiogram 2D Echocardiogram has been performed.  Tiffany Sweeney 03/01/2021, 2:44 PM

## 2021-04-19 ENCOUNTER — Inpatient Hospital Stay (HOSPITAL_COMMUNITY): Payer: Medicare Other | Attending: Hematology

## 2021-04-19 ENCOUNTER — Encounter (HOSPITAL_COMMUNITY): Payer: Self-pay

## 2021-04-19 DIAGNOSIS — Z452 Encounter for adjustment and management of vascular access device: Secondary | ICD-10-CM | POA: Diagnosis present

## 2021-04-19 DIAGNOSIS — Z171 Estrogen receptor negative status [ER-]: Secondary | ICD-10-CM

## 2021-04-19 DIAGNOSIS — Z853 Personal history of malignant neoplasm of breast: Secondary | ICD-10-CM | POA: Insufficient documentation

## 2021-04-19 MED ORDER — HEPARIN SOD (PORK) LOCK FLUSH 100 UNIT/ML IV SOLN
500.0000 [IU] | Freq: Once | INTRAVENOUS | Status: AC
  Administered 2021-04-19: 500 [IU] via INTRAVENOUS

## 2021-04-19 MED ORDER — SODIUM CHLORIDE 0.9% FLUSH
10.0000 mL | Freq: Once | INTRAVENOUS | Status: AC
  Administered 2021-04-19: 10 mL via INTRAVENOUS

## 2021-04-19 NOTE — Patient Instructions (Signed)
[facility]  Discharge Instructions: Thank you for choosing [facility] to provide your oncology and hematology care.  If you have a lab appointment with the Cancer Center, please come in thru the Main Entrance and check in at the main information desk.  Wear comfortable clothing and clothing appropriate for easy access to any Portacath or PICC line.   We strive to give you quality time with your provider. You may need to reschedule your appointment if you arrive late (15 or more minutes).  Arriving late affects you and other patients whose appointments are after yours.  Also, if you miss three or more appointments without notifying the office, you may be dismissed from the clinic at the provider's discretion.      For prescription refill requests, have your pharmacy contact our office and allow 72 hours for refills to be completed.        To help prevent nausea and vomiting after your treatment, we encourage you to take your nausea medication as directed.  BELOW ARE SYMPTOMS THAT SHOULD BE REPORTED IMMEDIATELY: *FEVER GREATER THAN 100.4 F (38 C) OR HIGHER *CHILLS OR SWEATING *NAUSEA AND VOMITING THAT IS NOT CONTROLLED WITH YOUR NAUSEA MEDICATION *UNUSUAL SHORTNESS OF BREATH *UNUSUAL BRUISING OR BLEEDING *URINARY PROBLEMS (pain or burning when urinating, or frequent urination) *BOWEL PROBLEMS (unusual diarrhea, constipation, pain near the anus) TENDERNESS IN MOUTH AND THROAT WITH OR WITHOUT PRESENCE OF ULCERS (sore throat, sores in mouth, or a toothache) UNUSUAL RASH, SWELLING OR PAIN  UNUSUAL VAGINAL DISCHARGE OR ITCHING   Items with * indicate a potential emergency and should be followed up as soon as possible or go to the Emergency Department if any problems should occur.  Please show the CHEMOTHERAPY ALERT CARD or IMMUNOTHERAPY ALERT CARD at check-in to the Emergency Department and triage nurse.  Should you have questions after your visit or need to cancel  or reschedule your appointment, please contact [facility] [phone]  and follow the prompts.  Office hours are 8:00 a.m. to 4:30 p.m. Monday - Friday. Please note that voicemails left after 4:00 p.m. may not be returned until the following business day.  We are closed weekends and major holidays. You have access to a nurse at all times for urgent questions. Please call the main number to the clinic [phone] and follow the prompts.  For any non-urgent questions, you may also contact your provider using MyChart. We now offer e-Visits for anyone 18 and older to request care online for non-urgent symptoms. For details visit [email or web address].   Also download the MyChart app! Go to the app store, search "MyChart", open the app, select [facility], and log in with your MyChart username and password.  Due to Covid, a mask is required upon entering the hospital/clinic. If you do not have a mask, one will be given to you upon arrival. For doctor visits, patients may have 1 support person aged 18 or older with them. For treatment visits, patients cannot have anyone with them due to current Covid guidelines and our immunocompromised population.  

## 2021-04-19 NOTE — Progress Notes (Signed)
Patients port flushed without difficulty.  Good blood return noted with no bruising or swelling noted at site.  Band aid applied.  VSS with discharge and left in satisfactory condition with no s/s of distress noted.   

## 2021-04-22 ENCOUNTER — Other Ambulatory Visit: Payer: Self-pay | Admitting: Cardiology

## 2021-05-25 ENCOUNTER — Other Ambulatory Visit: Payer: Self-pay | Admitting: Nurse Practitioner

## 2021-05-25 DIAGNOSIS — R519 Headache, unspecified: Secondary | ICD-10-CM

## 2021-07-03 ENCOUNTER — Other Ambulatory Visit: Payer: Self-pay | Admitting: Cardiology

## 2021-07-19 ENCOUNTER — Inpatient Hospital Stay (HOSPITAL_COMMUNITY): Payer: Medicare Other | Attending: Hematology

## 2021-07-19 ENCOUNTER — Encounter (HOSPITAL_COMMUNITY): Payer: Self-pay

## 2021-07-19 VITALS — BP 144/78 | HR 76 | Temp 99.0°F | Resp 18

## 2021-07-19 DIAGNOSIS — Z452 Encounter for adjustment and management of vascular access device: Secondary | ICD-10-CM | POA: Diagnosis not present

## 2021-07-19 DIAGNOSIS — Z853 Personal history of malignant neoplasm of breast: Secondary | ICD-10-CM | POA: Diagnosis present

## 2021-07-19 DIAGNOSIS — C50512 Malignant neoplasm of lower-outer quadrant of left female breast: Secondary | ICD-10-CM

## 2021-07-19 MED ORDER — HEPARIN SOD (PORK) LOCK FLUSH 100 UNIT/ML IV SOLN
500.0000 [IU] | Freq: Once | INTRAVENOUS | Status: AC
  Administered 2021-07-19: 500 [IU] via INTRAVENOUS

## 2021-07-19 MED ORDER — SODIUM CHLORIDE 0.9% FLUSH
10.0000 mL | Freq: Once | INTRAVENOUS | Status: AC
  Administered 2021-07-19: 10 mL via INTRAVENOUS

## 2021-07-19 NOTE — Progress Notes (Signed)
Patients port flushed without difficulty.  Good blood return noted with no bruising or swelling noted at site.  Band aid applied.  VSS with discharge and left in satisfactory condition with no s/s of distress noted.   

## 2021-07-19 NOTE — Patient Instructions (Signed)
[facility]  Discharge Instructions: Thank you for choosing [facility] to provide your oncology and hematology care.  If you have a lab appointment with the Cancer Center, please come in thru the Main Entrance and check in at the main information desk.  Wear comfortable clothing and clothing appropriate for easy access to any Portacath or PICC line.   We strive to give you quality time with your provider. You may need to reschedule your appointment if you arrive late (15 or more minutes).  Arriving late affects you and other patients whose appointments are after yours.  Also, if you miss three or more appointments without notifying the office, you may be dismissed from the clinic at the provider's discretion.      For prescription refill requests, have your pharmacy contact our office and allow 72 hours for refills to be completed.         To help prevent nausea and vomiting after your treatment, we encourage you to take your nausea medication as directed.  BELOW ARE SYMPTOMS THAT SHOULD BE REPORTED IMMEDIATELY: *FEVER GREATER THAN 100.4 F (38 C) OR HIGHER *CHILLS OR SWEATING *NAUSEA AND VOMITING THAT IS NOT CONTROLLED WITH YOUR NAUSEA MEDICATION *UNUSUAL SHORTNESS OF BREATH *UNUSUAL BRUISING OR BLEEDING *URINARY PROBLEMS (pain or burning when urinating, or frequent urination) *BOWEL PROBLEMS (unusual diarrhea, constipation, pain near the anus) TENDERNESS IN MOUTH AND THROAT WITH OR WITHOUT PRESENCE OF ULCERS (sore throat, sores in mouth, or a toothache) UNUSUAL RASH, SWELLING OR PAIN  UNUSUAL VAGINAL DISCHARGE OR ITCHING   Items with * indicate a potential emergency and should be followed up as soon as possible or go to the Emergency Department if any problems should occur.  Please show the CHEMOTHERAPY ALERT CARD or IMMUNOTHERAPY ALERT CARD at check-in to the Emergency Department and triage nurse.  Should you have questions after your visit or need to  cancel or reschedule your appointment, please contact [facility] [phone]  and follow the prompts.  Office hours are 8:00 a.m. to 4:30 p.m. Monday - Friday. Please note that voicemails left after 4:00 p.m. may not be returned until the following business day.  We are closed weekends and major holidays. You have access to a nurse at all times for urgent questions. Please call the main number to the clinic [phone] and follow the prompts.  For any non-urgent questions, you may also contact your provider using MyChart. We now offer e-Visits for anyone 18 and older to request care online for non-urgent symptoms. For details visit [email or web address].   Also download the MyChart app! Go to the app store, search "MyChart", open the app, select [facility], and log in with your MyChart username and password.  Masks are optional in the cancer centers. If you would like for your care team to wear a mask while they are taking care of you, please let them know. For doctor visits, patients may have with them one support person who is at least 85 years old. At this time, visitors are not allowed in the infusion area.  

## 2021-08-12 ENCOUNTER — Ambulatory Visit (INDEPENDENT_AMBULATORY_CARE_PROVIDER_SITE_OTHER): Payer: Medicare Other | Admitting: Cardiology

## 2021-08-12 ENCOUNTER — Encounter: Payer: Self-pay | Admitting: Cardiology

## 2021-08-12 VITALS — BP 104/78 | HR 63 | Ht 64.0 in | Wt 165.6 lb

## 2021-08-12 DIAGNOSIS — I25119 Atherosclerotic heart disease of native coronary artery with unspecified angina pectoris: Secondary | ICD-10-CM | POA: Diagnosis not present

## 2021-08-12 DIAGNOSIS — I10 Essential (primary) hypertension: Secondary | ICD-10-CM

## 2021-08-12 DIAGNOSIS — Z8679 Personal history of other diseases of the circulatory system: Secondary | ICD-10-CM

## 2021-08-12 NOTE — Progress Notes (Signed)
Cardiology Office Note  Date: 08/12/2021   ID: Tiffany Sweeney, DOB [birth year], MRN [id number]  PCP:  Tiffany Evens, MD  Cardiologist:  Rozann Lesches, MD Electrophysiologist:  None   Chief Complaint  Patient presents with   Cardiac follow-up    History of Present Illness: Tiffany Sweeney is an 87 y.o. female last seen in February.  She is here for a follow-up visit.  From a cardiac perspective, doing well with no exertional chest discomfort or worsening shortness of breath.  NYHA class II at baseline.  Follow-up echocardiogram in February of this year showed normalization of LVEF in the range of 55 to 60% without regional wall motion abnormalities, mild diastolic dysfunction, normal RV contraction.  I reviewed her medications which are noted below.  She has Lasix available but has not needed to use it.   Past Medical History:  Diagnosis Date   Anxiety    Arthritis    Breast cancer ([place])    Cardiomyopathy ([place])    Coronary atherosclerosis    Mild, nonobstructive - cardiac catheterization October 2022   Essential hypertension    Family history of colon cancer    Family history of ovarian cancer    Family history of pancreatic cancer    Hyperlipidemia    Skin cancer    Patient stated  she has low grade skin cancer in right hand    Past Surgical History:  Procedure Laterality Date   ABDOMINAL HYSTERECTOMY     CATARACT EXTRACTION W/PHACO Left 03/11/2013   Procedure: CATARACT EXTRACTION PHACO AND INTRAOCULAR LENS PLACEMENT ([place]);  Surgeon: Tiffany Branch, MD;  [facility]: [facility];  Service: Ophthalmology;  Laterality: Left;  CDE:7.54   CATARACT EXTRACTION W/PHACO Right 04/15/2013   Procedure: CATARACT EXTRACTION PHACO AND INTRAOCULAR LENS PLACEMENT ([facility]);  Surgeon: Tiffany Branch, MD;  Location: [facility];  Service: Ophthalmology;  Laterality: Right;  CDE 9.38   LEFT HEART CATH AND CORONARY ANGIOGRAPHY N/A 10/21/2020   Procedure: LEFT HEART CATH AND CORONARY ANGIOGRAPHY;  Surgeon:  Tiffany Osmond, MD;  Location: [facility];  Service: Cardiovascular;  Laterality: N/A;   MASTECTOMY Right    MASTECTOMY MODIFIED RADICAL Left 11/04/2013   Procedure: LEFT MASTECTOMY MODIFIED RADICAL;  Surgeon: Tiffany So, MD;  Location: [facility];  Service: General;  Laterality: Left;   PORTACATH PLACEMENT Right 12/06/2013   Procedure: INSERTION PORT-A-CATH-Right subclavian;  Surgeon: Tiffany So, MD;  Location: [facility];  Service: General;  Laterality: Right;   TOTAL HIP ARTHROPLASTY Right 08/05/2015   Procedure: RIGHT TOTAL HIP ARTHROPLASTY ANTERIOR APPROACH;  Surgeon: Tiffany Arabian, MD;  [facility];  Service: Orthopedics;  Laterality: Right;    Current Outpatient Medications  Medication Sig Dispense Refill   amLODipine (NORVASC) 5 MG tablet TAKE ONE TABLET BY MOUTH ONCE DAILY. 90 tablet 3   carvedilol (COREG) 6.25 MG tablet TAKE (1) TABLET BY MOUTH TWICE DAILY WITH MEALS. 180 tablet 3   furosemide (LASIX) 20 MG tablet Take 1 tablet (20 mg total) by mouth daily as needed (for weight gain of 3 lbs/day or 5 lbs/week, or swelling). 30 tablet 1   losartan (COZAAR) 100 MG tablet Take 100 mg by mouth daily.     nitrofurantoin, macrocrystal-monohydrate, (MACROBID) 100 MG capsule Take 100 mg by mouth 2 (two) times daily.     No current facility-administered medications for this visit.   Facility-Administered Medications Ordered in Other Visits  Medication Dose Route Frequency Provider Last Rate Last Admin   sodium chloride  0.9 % injection 10 mL  10 mL Intravenous PRN Tiffany Sweeney A, RN   10 mL at 08/26/14 1333   sodium chloride flush (NS) 0.9 % injection 10 mL  10 mL Intravenous PRN Tiffany Ranks, MD   10 mL at 11/02/15 1117   sodium chloride flush (NS) 0.9 % injection 10 mL  10 mL Intravenous PRN Tiffany Bouche, NP   10 mL at 08/16/16 1125   Allergies:  Statins   ROS: Constipation.  Physical Exam: VS:  BP 104/78   Pulse 63   Ht '5\' 4"'$  (1.626 m)   Wt 165 lb 9.6  oz (75.1 kg)   SpO2 98%   BMI 28.43 kg/m , BMI Body mass index is 28.43 kg/m.  Wt Readings from Last 3 Encounters:  08/12/21 165 lb 9.6 oz (75.1 kg)  10/30/20 165 lb 6.4 oz (75 kg)  10/26/20 163 lb 6.4 oz (74.1 kg)    General: Patient appears comfortable at rest. HEENT: Conjunctiva and lids normal, oropharynx clear. Cardiac: Regular rate and rhythm, no S3 or significant systolic murmur, no pericardial rub. Extremities: No pitting edema.  ECG:  An ECG dated 10/21/2020 was personally reviewed today and demonstrated:  Sinus rhythm with LVH, inferolateral deep T wave inversions.  Recent Labwork: 10/21/2020: ALT 16; AST 29 10/22/2020: BUN 9; Creatinine, Ser 0.81; Hemoglobin 14.0; Platelets 192; Potassium 3.2; Sodium 139     Component Value Date/Time   CHOL 224 (H) 10/22/2020 0615   TRIG 122 10/22/2020 0615   HDL 48 10/22/2020 0615   CHOLHDL 4.7 10/22/2020 0615   VLDL 24 10/22/2020 0615   LDLCALC 152 (H) 10/22/2020 0615    Other Studies Reviewed Today:  Echocardiogram 03/01/2021:  1. Left ventricular ejection fraction, by estimation, is 55 to 60%. The  left ventricle has normal function. The left ventricle has no regional  wall motion abnormalities. There is moderate left ventricular hypertrophy.  Left ventricular diastolic  parameters are consistent with Grade I diastolic dysfunction (impaired  relaxation).   2. Right ventricular systolic function is normal. The right ventricular  size is normal. There is normal pulmonary artery systolic pressure.   3. The mitral valve is normal in structure. No evidence of mitral valve  regurgitation. No evidence of mitral stenosis.   4. The tricuspid valve is abnormal.   5. The aortic valve is tricuspid. Aortic valve regurgitation is not  visualized. No aortic stenosis is present.   6. The inferior vena cava is normal in size with greater than 50%  respiratory variability, suggesting right atrial pressure of 3 mmHg.   Assessment and  Plan:  1.  HFrecEF with history of possible stress-induced cardiomyopathy versus myocarditis.  Follow-up echocardiogram earlier in the year showed normalization of LVEF in the range of 55 to 60%.  She had only mild coronary atherosclerosis at cardiac catheterization with initial diagnosis.  For now would recommend medical therapy and observation, she can keep routine follow-up with her PCP.  Continue Coreg, Norvasc, Cozaar, and as needed Lasix.  2.  Essential hypertension, blood pressure is low normal range today and she is asymptomatic.  No changes made to current regimen.  Keep follow-up with PCP.  Medication Adjustments/Labs and Tests Ordered: Current medicines are reviewed at length with the patient today.  Concerns regarding medicines are outlined above.   Tests Ordered: No orders of the defined types were placed in this encounter.   Medication Changes: No orders of the defined types were placed in this encounter.  Disposition:  Follow up  as needed.  Signed, Satira Sark, MD, [facility] 08/12/2021 1:53 PM    [facility] at [place], [place], [place] [address] Phone: [phone]; Fax: [phone]

## 2021-08-12 NOTE — Patient Instructions (Addendum)
Medication Instructions:   Your physician recommends that you continue on your current medications as directed. Please refer to the Current Medication list given to you today.  Labwork:  none  Testing/Procedures:  none  Follow-Up:  Your physician recommends that you schedule a follow-up appointment in: as needed.  Any Other Special Instructions Will Be Listed Below (If Applicable).  If you need a refill on your cardiac medications before your next appointment, please call your pharmacy. 

## 2021-09-19 IMAGING — CT CT HEAD W/O CM
3 series · 16 of 47 positions shown, 19 images · non-contrast
Comparison: None.

CLINICAL DATA: Altered level of consciousness, unexplained. History
of breast cancer status post bilateral mastectomy.

EXAM:
CT HEAD WITHOUT CONTRAST
TECHNIQUE: Contiguous axial images were obtained from the base of the skull
through the vertex without intravenous contrast.

[Series 2: head w o · axial · 0.44mm/px · z∈[+118,+243]mm · 10 of 31 slices shown, 13 images]
[im 3/31  brain]
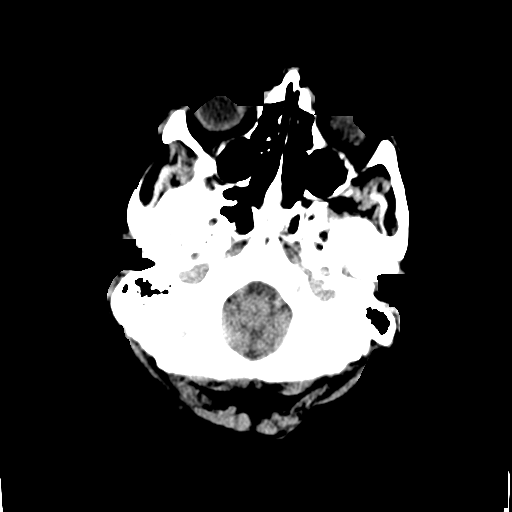
[im 3/31  bone]
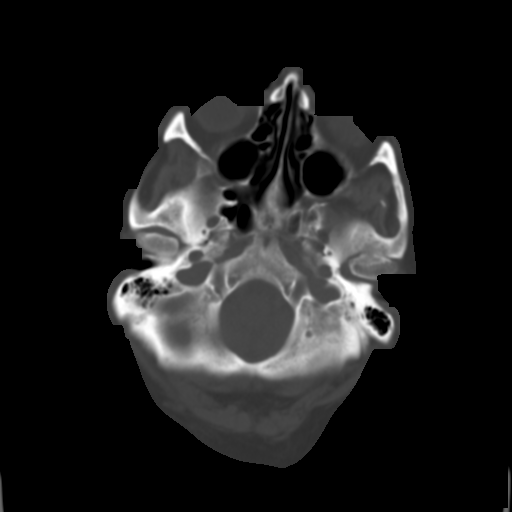
[im 6/31  brain]
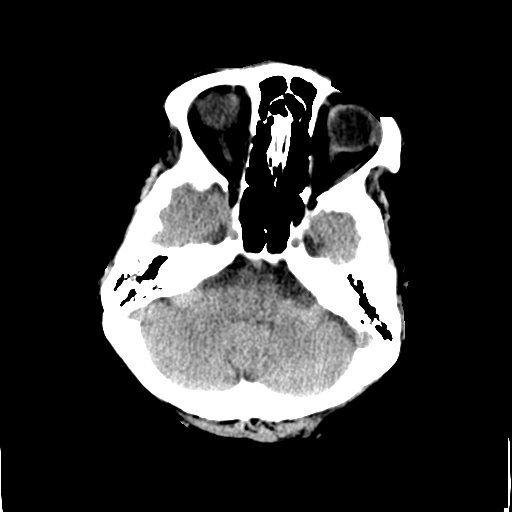
[im 9/31  brain]
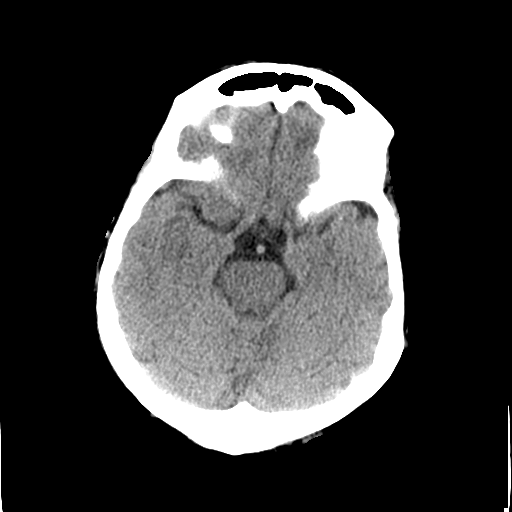
[im 11/31  brain]
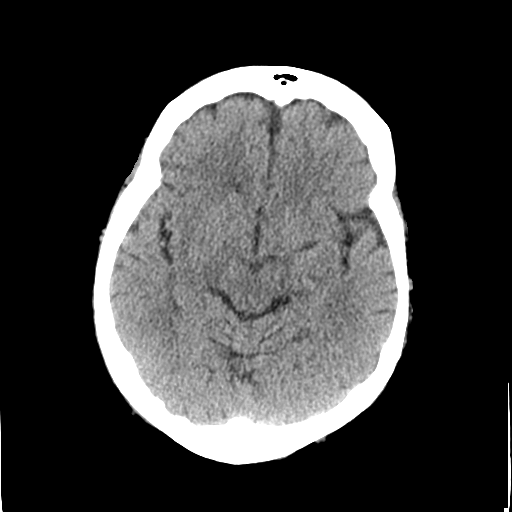
[im 14/31  brain]
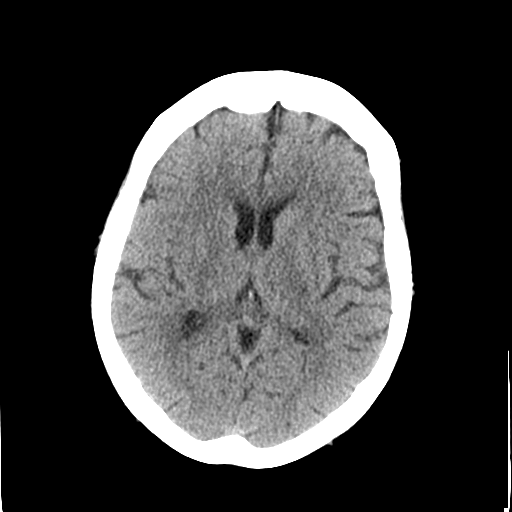
[im 14/31  bone]
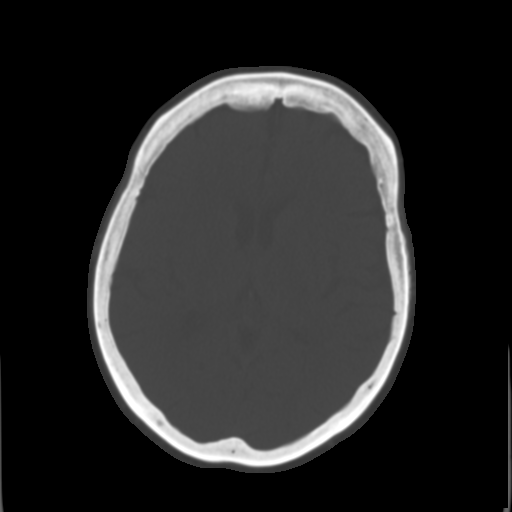
[im 17/31  brain]
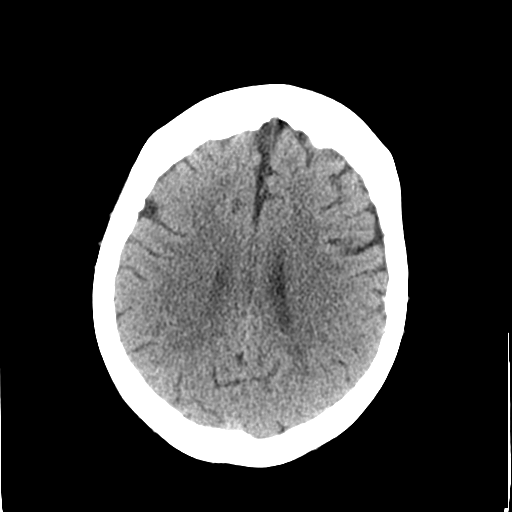
[im 20/31  brain]
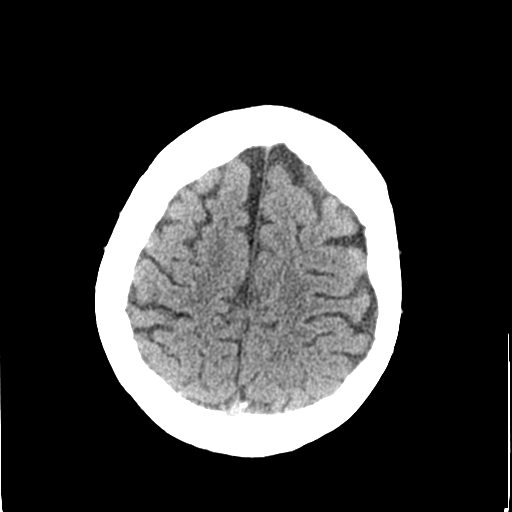
[im 23/31  brain]
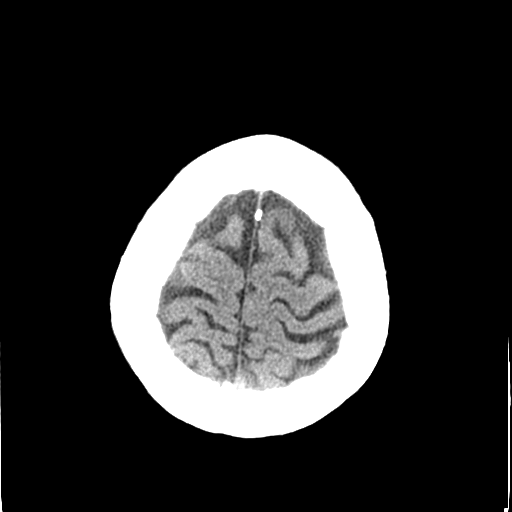
[im 25/31  brain]
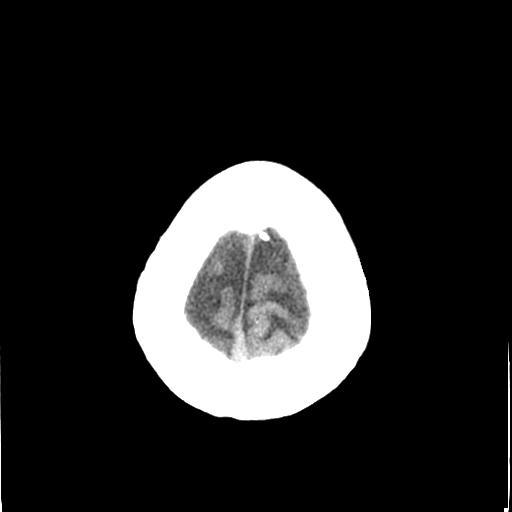
[im 25/31  bone]
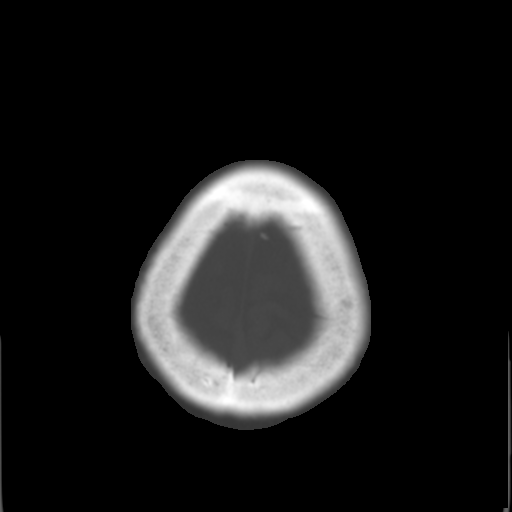
[im 28/31  brain]
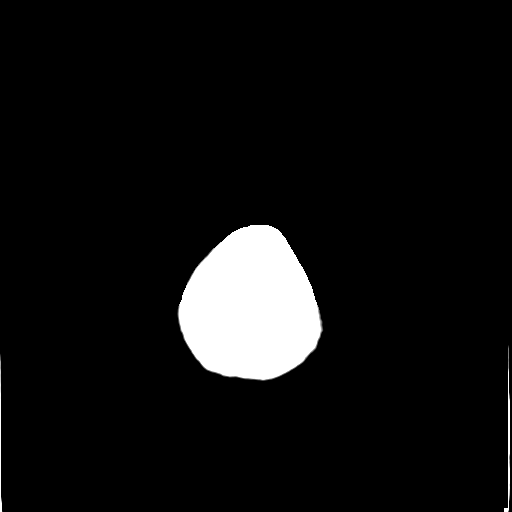

[Series 4: coronal soft · coronal · 0.37mm/px · 3 of 66 slices shown]
[im 22/66  brain]
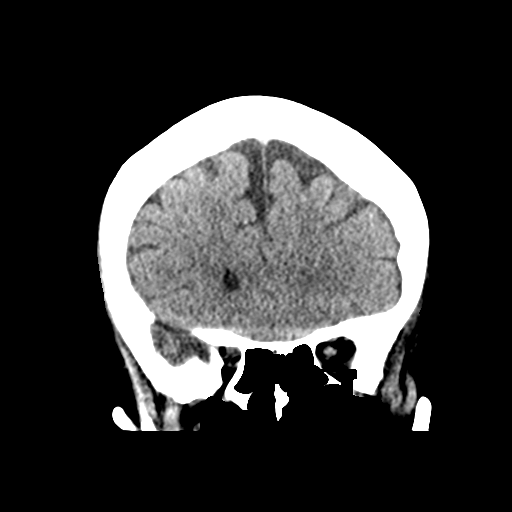
[im 29/66  brain]
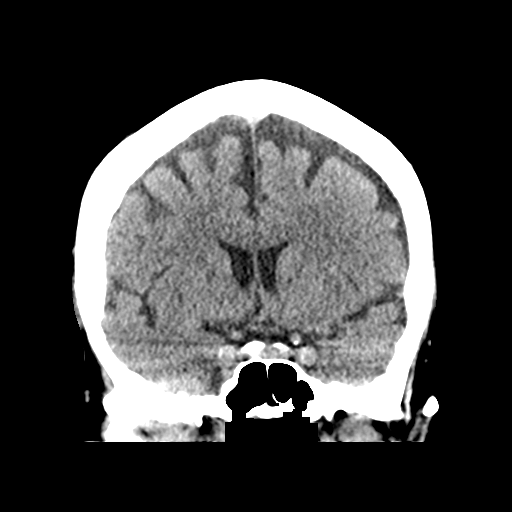
[im 37/66  brain]
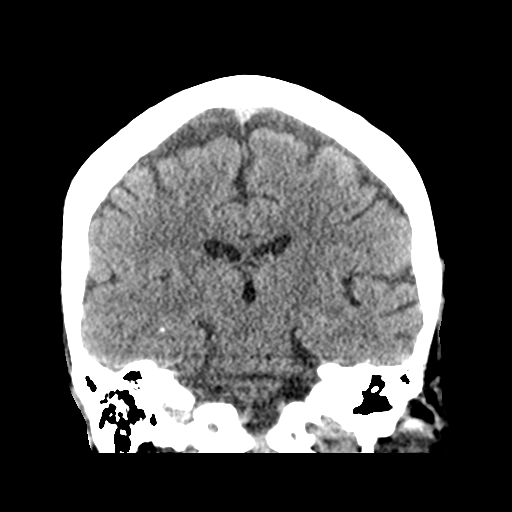

[Series 5: sagittal soft · sagittal · 0.35mm/px · 3 of 55 slices shown]
[im 19/55  brain]
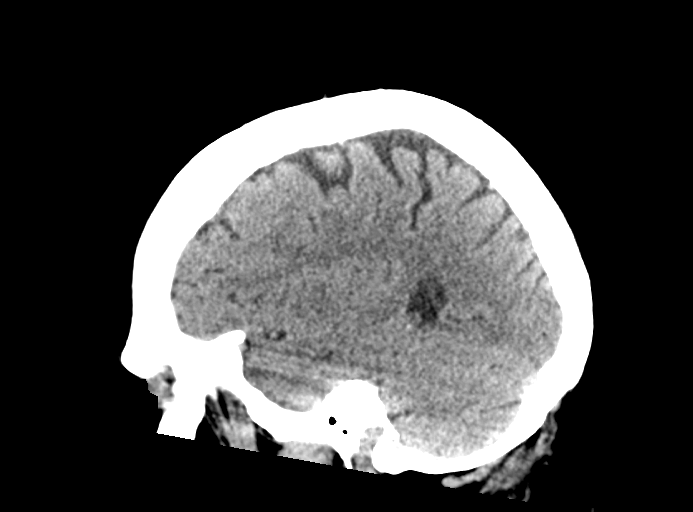
[im 28/55  brain]
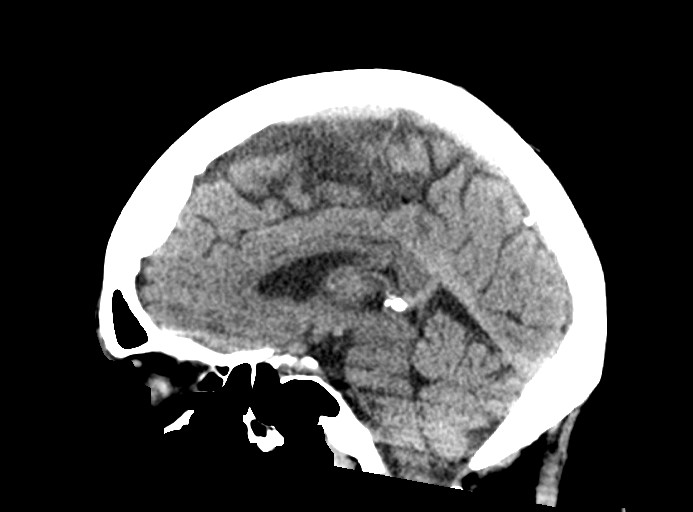
[im 37/55  brain]
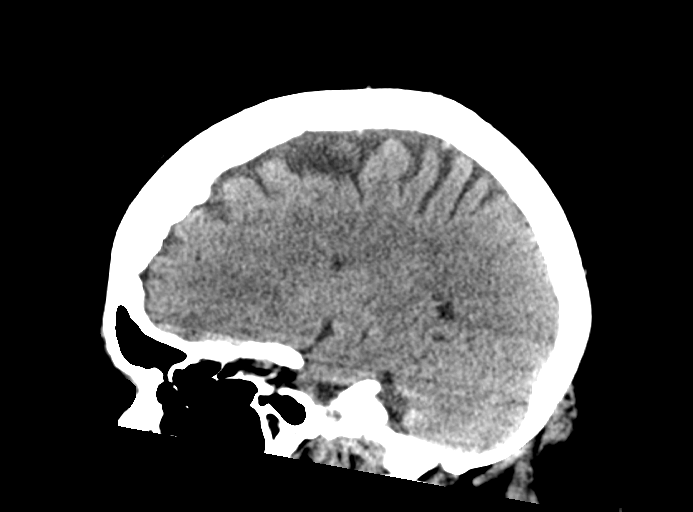

[16 of 47 positions shown; findings below may reference images not displayed]

FINDINGS: Brain: No hydrocephalus. No mass, hemorrhage, edema or other
evidence of acute parenchymal abnormality. No extra-axial
hemorrhage.

Vascular: No hyperdense vessel or unexpected calcification.

Skull: Normal. Negative for fracture or focal lesion.

Sinuses/Orbits: No acute finding.

Other: None.
IMPRESSION: Negative head CT. No intracranial mass, hemorrhage or edema.

## 2021-10-25 ENCOUNTER — Inpatient Hospital Stay: Payer: Medicare Other | Attending: Hematology

## 2021-10-25 ENCOUNTER — Other Ambulatory Visit (HOSPITAL_COMMUNITY): Payer: Medicare Other

## 2021-11-01 ENCOUNTER — Ambulatory Visit: Payer: Medicare Other | Admitting: Hematology

## 2021-11-04 ENCOUNTER — Inpatient Hospital Stay: Payer: Medicare Other | Attending: Hematology

## 2021-11-04 VITALS — BP 112/54 | HR 69 | Temp 97.3°F | Resp 18

## 2021-11-04 DIAGNOSIS — Z9013 Acquired absence of bilateral breasts and nipples: Secondary | ICD-10-CM | POA: Diagnosis not present

## 2021-11-04 DIAGNOSIS — I10 Essential (primary) hypertension: Secondary | ICD-10-CM | POA: Insufficient documentation

## 2021-11-04 DIAGNOSIS — R600 Localized edema: Secondary | ICD-10-CM | POA: Diagnosis not present

## 2021-11-04 DIAGNOSIS — I890 Lymphedema, not elsewhere classified: Secondary | ICD-10-CM | POA: Diagnosis not present

## 2021-11-04 DIAGNOSIS — Z853 Personal history of malignant neoplasm of breast: Secondary | ICD-10-CM | POA: Insufficient documentation

## 2021-11-04 DIAGNOSIS — C50512 Malignant neoplasm of lower-outer quadrant of left female breast: Secondary | ICD-10-CM

## 2021-11-04 LAB — COMPREHENSIVE METABOLIC PANEL
ALT: 13 U/L (ref 0–44)
AST: 16 U/L (ref 15–41)
Albumin: 3.3 g/dL — ABNORMAL LOW (ref 3.5–5.0)
Alkaline Phosphatase: 66 U/L (ref 38–126)
Anion gap: 8 (ref 5–15)
BUN: 15 mg/dL (ref 8–23)
CO2: 27 mmol/L (ref 22–32)
Calcium: 8.5 mg/dL — ABNORMAL LOW (ref 8.9–10.3)
Chloride: 106 mmol/L (ref 98–111)
Creatinine, Ser: 0.74 mg/dL (ref 0.44–1.00)
GFR, Estimated: >60 mL/min (ref >60)
Glucose, Bld: 133 mg/dL — ABNORMAL HIGH (ref 70–99)
Potassium: 3.5 mmol/L (ref 3.5–5.1)
Sodium: 141 mmol/L (ref 135–145)
Total Bilirubin: 0.4 mg/dL (ref 0.3–1.2)
Total Protein: 6 g/dL — ABNORMAL LOW (ref 6.5–8.1)

## 2021-11-04 LAB — CBC WITH DIFFERENTIAL/PLATELET
Abs Immature Granulocytes: 0.02 10*3/uL (ref 0.00–0.07)
Basophils Absolute: 0.1 10*3/uL (ref 0.0–0.1)
Basophils Relative: 1 %
Eosinophils Absolute: 0.2 10*3/uL (ref 0.0–0.5)
Eosinophils Relative: 3 %
HCT: 39.9 % (ref 36.0–46.0)
Hemoglobin: 13.2 g/dL (ref 12.0–15.0)
Immature Granulocytes: 0 %
Lymphocytes Relative: 22 %
Lymphs Abs: 1.3 10*3/uL (ref 0.7–4.0)
MCH: 29.9 pg (ref 26.0–34.0)
MCHC: 33.1 g/dL (ref 30.0–36.0)
MCV: 90.3 fL (ref 80.0–100.0)
Monocytes Absolute: 0.6 10*3/uL (ref 0.1–1.0)
Monocytes Relative: 10 %
Neutro Abs: 3.8 10*3/uL (ref 1.7–7.7)
Neutrophils Relative %: 64 %
Platelets: 203 10*3/uL (ref 150–400)
RBC: 4.42 MIL/uL (ref 3.87–5.11)
RDW: 13.2 % (ref 11.5–15.5)
WBC: 6 10*3/uL (ref 4.0–10.5)
nRBC: 0 % (ref 0.0–0.2)

## 2021-11-04 LAB — LIPID PANEL
Cholesterol: 209 mg/dL — ABNORMAL HIGH (ref 0–200)
HDL: 43 mg/dL (ref >40)
LDL Cholesterol: 136 mg/dL — ABNORMAL HIGH (ref 0–99)
Total CHOL/HDL Ratio: 4.9 RATIO
Triglycerides: 152 mg/dL — ABNORMAL HIGH (ref <150)
VLDL: 30 mg/dL (ref 0–40)

## 2021-11-04 LAB — TSH: TSH: 2.074 u[IU]/mL (ref 0.350–4.500)

## 2021-11-04 LAB — VITAMIN B12: Vitamin B-12: 312 pg/mL (ref 180–914)

## 2021-11-04 MED ORDER — SODIUM CHLORIDE 0.9% FLUSH
10.0000 mL | Freq: Once | INTRAVENOUS | Status: AC
  Administered 2021-11-04: 10 mL via INTRAVENOUS

## 2021-11-04 MED ORDER — HEPARIN SOD (PORK) LOCK FLUSH 100 UNIT/ML IV SOLN
500.0000 [IU] | Freq: Once | INTRAVENOUS | Status: AC
  Administered 2021-11-04: 500 [IU] via INTRAVENOUS

## 2021-11-04 NOTE — Patient Instructions (Signed)
[facility]  Discharge Instructions: Thank you for choosing [facility] to provide your oncology and hematology care.  If you have a lab appointment with the [facility], please come in thru the Main Entrance and check in at the main information desk.  Wear comfortable clothing and clothing appropriate for easy access to any Portacath or PICC line.   We strive to give you quality time with your provider. You may need to reschedule your appointment if you arrive late (15 or more minutes).  Arriving late affects you and other patients whose appointments are after yours.  Also, if you miss three or more appointments without notifying the office, you may be dismissed from the clinic at the provider's discretion.      For prescription refill requests, have your pharmacy contact our office and allow 72 hours for refills to be completed.    Today you received the following chemotherapy and/or immunotherapy agents port flush labs      To help prevent nausea and vomiting after your treatment, we encourage you to take your nausea medication as directed.  BELOW ARE SYMPTOMS THAT SHOULD BE REPORTED IMMEDIATELY: *FEVER GREATER THAN 100.4 F (38 C) OR HIGHER *CHILLS OR SWEATING *NAUSEA AND VOMITING THAT IS NOT CONTROLLED WITH YOUR NAUSEA MEDICATION *UNUSUAL SHORTNESS OF BREATH *UNUSUAL BRUISING OR BLEEDING *URINARY PROBLEMS (pain or burning when urinating, or frequent urination) *BOWEL PROBLEMS (unusual diarrhea, constipation, pain near the anus) TENDERNESS IN MOUTH AND THROAT WITH OR WITHOUT PRESENCE OF ULCERS (sore throat, sores in mouth, or a toothache) UNUSUAL RASH, SWELLING OR PAIN  UNUSUAL VAGINAL DISCHARGE OR ITCHING   Items with * indicate a potential emergency and should be followed up as soon as possible or go to the Emergency Department if any problems should occur.  Please show the CHEMOTHERAPY ALERT CARD or IMMUNOTHERAPY ALERT CARD at check-in to the  Emergency Department and triage nurse.  Should you have questions after your visit or need to cancel or reschedule your appointment, please contact [facility] [phone]  and follow the prompts.  Office hours are 8:00 a.m. to 4:30 p.m. Monday - Friday. Please note that voicemails left after 4:00 p.m. may not be returned until the following business day.  We are closed weekends and major holidays. You have access to a nurse at all times for urgent questions. Please call the main number to the clinic [phone] and follow the prompts.  For any non-urgent questions, you may also contact your provider using MyChart. We now offer e-Visits for anyone 43 and older to request care online for non-urgent symptoms. For details visit [email or web address].   Also download the MyChart app! Go to the app store, search "MyChart", open the app, select [facility], and log in with your MyChart username and password.  Masks are optional in the cancer centers. If you would like for your care team to wear a mask while they are taking care of you, please let them know. You may have one support person who is at least 85 years old accompany you for your appointments.

## 2021-11-04 NOTE — Progress Notes (Signed)
Patients port flushed without difficulty.  Good blood return noted with no bruising or swelling noted at site.  Band aid applied.  VSS with discharge and left in satisfactory condition with no s/s of distress noted.   

## 2021-11-04 NOTE — Addendum Note (Signed)
Addended by: Fayne Norrie on: 11/04/2021 12:23 PM   Modules accepted: Orders

## 2021-11-06 LAB — CANCER ANTIGEN 15-3: CA 15-3: 20.3 U/mL (ref 0.0–25.0)

## 2021-11-11 ENCOUNTER — Inpatient Hospital Stay (HOSPITAL_BASED_OUTPATIENT_CLINIC_OR_DEPARTMENT_OTHER): Payer: Medicare Other | Admitting: Hematology

## 2021-11-11 VITALS — BP 137/82 | HR 70 | Temp 98.7°F | Resp 16 | Wt 163.9 lb

## 2021-11-11 DIAGNOSIS — Z171 Estrogen receptor negative status [ER-]: Secondary | ICD-10-CM

## 2021-11-11 DIAGNOSIS — Z853 Personal history of malignant neoplasm of breast: Secondary | ICD-10-CM | POA: Diagnosis not present

## 2021-11-11 DIAGNOSIS — C50512 Malignant neoplasm of lower-outer quadrant of left female breast: Secondary | ICD-10-CM

## 2021-11-11 NOTE — Patient Instructions (Signed)
[facility] at [facility] Discharge Instructions   You were seen and examined today by Dr. Delton Coombes.  He reviewed the results of your lab work which are normal.    Thank you for choosing [facility] at [facility] to provide your oncology and hematology care.  To afford each patient quality time with our provider, please arrive at least 15 minutes before your scheduled appointment time.   If you have a lab appointment with the [facility] please come in thru the Main Entrance and check in at the main information desk.  You need to re-schedule your appointment should you arrive 10 or more minutes late.  We strive to give you quality time with our providers, and arriving late affects you and other patients whose appointments are after yours.  Also, if you no show three or more times for appointments you may be dismissed from the clinic at the providers discretion.     Again, thank you for choosing [facility].  Our hope is that these requests will decrease the amount of time that you wait before being seen by our physicians.       _____________________________________________________________  Should you have questions after your visit to [facility], please contact our office at [phone] and follow the prompts.  Our office hours are 8:00 a.m. and 4:30 p.m. Monday - Friday.  Please note that voicemails left after 4:00 p.m. may not be returned until the following business day.  We are closed weekends and major holidays.  You do have access to a nurse 24-7, just call the main number to the clinic [phone] and do not press any options, hold on the line and a nurse will answer the phone.    For prescription refill requests, have your pharmacy contact our office and allow 72 hours.    Due to Covid, you will need to wear a mask upon entering the hospital. If you do not have a mask, a mask will be given to you at the Main  Entrance upon arrival. For doctor visits, patients may have 1 support person age 46 or older with them. For treatment visits, patients can not have anyone with them due to social distancing guidelines and our immunocompromised population.

## 2021-11-11 NOTE — Progress Notes (Signed)
[facility] [address] [place] [address]   Patient Care Team: Lemmie Evens, MD as PCP - General (Family Medicine) Satira Sark, MD as PCP - Cardiology (Cardiology) Marin Olp Rudell Cobb, MD as Consulting Physician (Oncology) Aviva Signs, MD as Consulting Physician (General Surgery)  SUMMARY OF ONCOLOGIC HISTORY: Oncology History  Breast cancer ([place])  07/25/2013 Imaging   Plain film of the lumbar spine, complete 4 view. Chronic lumbar degenerative change most prominent at L4-5. No acute abnormality and no change from prior study   10/21/2013 Initial Diagnosis   Left breast cancer, core biopsy, ER negative, PR negative, HER-2/neu negative, Ki-67 of 13%, infiltrating duct cell   11/04/2013 Surgery   Left modified radical mastectomy, 1.8 cm infiltrating duct cell carcinoma, ER negative, PR negative, HER-2/neu not overexpressed, 9 lymph nodes negative. Stage TI cN0 M0.   12/06/2013 Imaging   Portable chest x-ray showing no pneumothorax no lung edema or consolidation Port-A-Cath tip in the SVC.   12/23/2013 - 02/03/2014 Chemotherapy   Cycle 1 of TC initiated on 12/23/2013, patient completed 3 cycles of prescirbed therapy     CHIEF COMPLIANT: Follow-up for breast cancer, ER-/PR-/HER2-   INTERVAL HISTORY: [name] is a 87 y.o. female seen for follow-up of triple negative breast cancer.  Denies any new onset pains.  Chronic leg swellings have been stable.  Chronic numbness has been stable in the feet.  Reports 100% energy levels.  REVIEW OF SYSTEMS:   Review of Systems  Constitutional:  Negative for appetite change (50%).  Cardiovascular:  Positive for leg swelling.  Gastrointestinal:  Positive for constipation.  Neurological:  Positive for numbness (Feet).  All other systems reviewed and are negative.   I have reviewed the past medical history, past surgical history, social history and family history with the patient and they are unchanged  from previous note.   ALLERGIES:   is allergic to statins.   MEDICATIONS:  Current Outpatient Medications  Medication Sig Dispense Refill   amLODipine (NORVASC) 5 MG tablet TAKE ONE TABLET BY MOUTH ONCE DAILY. 90 tablet 3   carvedilol (COREG) 6.25 MG tablet TAKE (1) TABLET BY MOUTH TWICE DAILY WITH MEALS. 180 tablet 3   furosemide (LASIX) 20 MG tablet Take 1 tablet (20 mg total) by mouth daily as needed (for weight gain of 3 lbs/day or 5 lbs/week, or swelling). 30 tablet 1   losartan (COZAAR) 100 MG tablet Take 100 mg by mouth daily.     No current facility-administered medications for this visit.   Facility-Administered Medications Ordered in Other Visits  Medication Dose Route Frequency Provider Last Rate Last Admin   sodium chloride 0.9 % injection 10 mL  10 mL Intravenous PRN Kurtis Bushman A, RN   10 mL at 08/26/14 1333   sodium chloride flush (NS) 0.9 % injection 10 mL  10 mL Intravenous PRN Penland, Kelby Fam, MD   10 mL at 11/02/15 1117   sodium chloride flush (NS) 0.9 % injection 10 mL  10 mL Intravenous PRN Holley Bouche, NP   10 mL at 08/16/16 1125     PHYSICAL EXAMINATION: Performance status (ECOG): 1 - Symptomatic but completely ambulatory  Vitals:   11/11/21 1135  BP: 137/82  Pulse: 70  Resp: 16  Temp: 98.7 F (37.1 C)  SpO2: 97%   Wt Readings from Last 3 Encounters:  11/11/21 163 lb 14.4 oz (74.3 kg)  08/12/21 165 lb 9.6 oz (75.1 kg)  10/30/20 165 lb  6.4 oz (75 kg)   Physical Exam Vitals reviewed.  Constitutional:      Appearance: Normal appearance.  Cardiovascular:     Rate and Rhythm: Normal rate and regular rhythm.     Pulses: Normal pulses.     Heart sounds: Normal heart sounds.  Pulmonary:     Effort: Pulmonary effort is normal.     Breath sounds: Normal breath sounds.  Chest:  Breasts:    Right: Absent.     Left: Absent.  Lymphadenopathy:     Upper Body:     Right upper body: No supraclavicular, axillary or pectoral adenopathy.      Left upper body: No supraclavicular, axillary or pectoral adenopathy.  Neurological:     General: No focal deficit present.     Mental Status: She is alert and oriented to person, place, and time.  Psychiatric:        Mood and Affect: Mood normal.        Behavior: Behavior normal.     Breast Exam Chaperone: Thana Ates     LABORATORY DATA:  I have reviewed the data as listed    Latest Ref Rng & Units 11/04/2021   12:11 PM 10/22/2020    6:15 AM 10/21/2020   11:00 AM  CMP  Glucose 70 - 99 mg/dL 133  114  144   BUN 8 - 23 mg/dL _0 Creatinine 0.44 - 1.00 mg/dL 0.74  0.81  0.81   Sodium 135 - 145 mmol/L 141  139  138   Potassium 3.5 - 5.1 mmol/L 3.5  3.2  3.1   Chloride 98 - 111 mmol/L 106  104  105   CO2 22 - 32 mmol/L _1 Calcium 8.9 - 10.3 mg/dL 8.5  8.3  8.3   Total Protein 6.5 - 8.1 g/dL 6.0   6.1   Total Bilirubin 0.3 - 1.2 mg/dL 0.4   0.9   Alkaline Phos 38 - 126 U/L 66   65   AST 15 - 41 U/L 16   29   ALT 0 - 44 U/L 13   16    Lab Results  Component Value Date   CAN153 20.3 11/04/2021   CAN153 23.5 10/19/2020   CAN153 18.6 10/15/2019   Lab Results  Component Value Date   WBC 6.0 11/04/2021   HGB 13.2 11/04/2021   HCT 39.9 11/04/2021   MCV 90.3 11/04/2021   PLT 203 11/04/2021   NEUTROABS 3.8 11/04/2021    ASSESSMENT:  1.  Stage I (T1CN0) triple negative left breast cancer: -Status post left mastectomy Lymph node dissection on 11/04/2013, 1.8 cm IDC, 0/9 lymph nodes positive. -Status post 3 cycles of TC in adjuvant setting, fourth cycle not given due to fatigue and neuropathy, completed on 02/03/2014. -Genetic testing negative in April 2016.   2.  Right breast cancer: - She has remote history of right breast cancer and underwent right mastectomy.   3.  Bilateral lymphedema: -She has lymphedema in both upper extremities, right more than left. -She also has bilateral lower extremity edema.     PLAN:  1.  Stage I (T1CN0) triple  negative left breast cancer: - Physical exam shows bilateral mastectomy sites with no palpable nodules or adenopathy. - Labs from 11/04/2021 with normal LFTs with mild low albumin.  CBC was normal.  TSH was normal. - CA 15-3 was 20. - RTC 1 year for follow-up with exam and  labs.      Breast Cancer therapy associated bone loss: I have recommended calcium, Vitamin D and weight bearing exercises.  Orders placed this encounter:  Orders Placed This Encounter  Procedures   CBC with Differential   Comprehensive metabolic panel   Cancer antigen 15-3     The patient has a good understanding of the overall plan. She agrees with it. She will call with any problems that may develop before the next visit here.  Derek Jack, MD [facility] [phone]

## 2021-11-23 ENCOUNTER — Other Ambulatory Visit (HOSPITAL_COMMUNITY): Payer: Self-pay | Admitting: Nurse Practitioner

## 2021-11-23 DIAGNOSIS — M549 Dorsalgia, unspecified: Secondary | ICD-10-CM

## 2022-02-11 ENCOUNTER — Inpatient Hospital Stay: Payer: Medicare Other

## 2022-02-21 ENCOUNTER — Inpatient Hospital Stay: Payer: Medicare Other | Attending: Hematology

## 2022-02-21 VITALS — BP 135/70 | HR 83 | Temp 97.6°F | Resp 18

## 2022-02-21 DIAGNOSIS — Z452 Encounter for adjustment and management of vascular access device: Secondary | ICD-10-CM | POA: Insufficient documentation

## 2022-02-21 DIAGNOSIS — Z853 Personal history of malignant neoplasm of breast: Secondary | ICD-10-CM | POA: Diagnosis present

## 2022-02-21 DIAGNOSIS — Z95828 Presence of other vascular implants and grafts: Secondary | ICD-10-CM

## 2022-02-21 MED ORDER — HEPARIN SOD (PORK) LOCK FLUSH 100 UNIT/ML IV SOLN
500.0000 [IU] | Freq: Once | INTRAVENOUS | Status: AC
  Administered 2022-02-21: 500 [IU] via INTRAVENOUS

## 2022-02-21 MED ORDER — SODIUM CHLORIDE 0.9% FLUSH
10.0000 mL | Freq: Once | INTRAVENOUS | Status: AC
  Administered 2022-02-21: 10 mL via INTRAVENOUS

## 2022-02-21 NOTE — Patient Instructions (Signed)
[facility]  Discharge Instructions: Thank you for choosing [facility] to provide your oncology and hematology care.  If you have a lab appointment with the [facility], please come in thru the Main Entrance and check in at the main information desk.  Wear comfortable clothing and clothing appropriate for easy access to any Portacath or PICC line.   We strive to give you quality time with your provider. You may need to reschedule your appointment if you arrive late (15 or more minutes).  Arriving late affects you and other patients whose appointments are after yours.  Also, if you miss three or more appointments without notifying the office, you may be dismissed from the clinic at the provider's discretion.      For prescription refill requests, have your pharmacy contact our office and allow 72 hours for refills to be completed.    Today you received the following port flush, return as scheduled.   To help prevent nausea and vomiting after your treatment, we encourage you to take your nausea medication as directed.  BELOW ARE SYMPTOMS THAT SHOULD BE REPORTED IMMEDIATELY: *FEVER GREATER THAN 100.4 F (38 C) OR HIGHER *CHILLS OR SWEATING *NAUSEA AND VOMITING THAT IS NOT CONTROLLED WITH YOUR NAUSEA MEDICATION *UNUSUAL SHORTNESS OF BREATH *UNUSUAL BRUISING OR BLEEDING *URINARY PROBLEMS (pain or burning when urinating, or frequent urination) *BOWEL PROBLEMS (unusual diarrhea, constipation, pain near the anus) TENDERNESS IN MOUTH AND THROAT WITH OR WITHOUT PRESENCE OF ULCERS (sore throat, sores in mouth, or a toothache) UNUSUAL RASH, SWELLING OR PAIN  UNUSUAL VAGINAL DISCHARGE OR ITCHING   Items with * indicate a potential emergency and should be followed up as soon as possible or go to the Emergency Department if any problems should occur.  Please show the CHEMOTHERAPY ALERT CARD or IMMUNOTHERAPY ALERT CARD at check-in to the Emergency Department and  triage nurse.  Should you have questions after your visit or need to cancel or reschedule your appointment, please contact [facility] [phone]  and follow the prompts.  Office hours are 8:00 a.m. to 4:30 p.m. Monday - Friday. Please note that voicemails left after 4:00 p.m. may not be returned until the following business day.  We are closed weekends and major holidays. You have access to a nurse at all times for urgent questions. Please call the main number to the clinic [phone] and follow the prompts.  For any non-urgent questions, you may also contact your provider using MyChart. We now offer e-Visits for anyone 84 and older to request care online for non-urgent symptoms. For details visit [email or web address].   Also download the MyChart app! Go to the app store, search "MyChart", open the app, select [facility], and log in with your MyChart username and password.

## 2022-02-21 NOTE — Progress Notes (Signed)
Port flushed with good blood return noted. No bruising or swelling at site. Bandaid applied and patient discharged in satisfactory condition. VVS stable with no signs or symptoms of distressed noted. 

## 2022-03-14 IMAGING — US US EXTREM LOW VENOUS*L*
1 series · 13 of 24 positions shown · non-contrast
Comparison: None.

CLINICAL DATA: Lower extremity edema for 3 days

EXAM:
LEFT LOWER EXTREMITY VENOUS DUPLEX ULTRASOUND
TECHNIQUE: Gray-scale sonography with graded compression, as well as color
Doppler and duplex ultrasound were performed to evaluate the left
lower extremity deep venous system from the level of the common
femoral vein and including the common femoral, femoral, profunda
femoral, popliteal and calf veins including the posterior tibial,
peroneal and gastrocnemius veins when visible. The superficial great
saphenous vein was also interrogated. Spectral Doppler was utilized
to evaluate flow at rest and with distal augmentation maneuvers in
the common femoral, femoral and popliteal veins.

[Series 1: us extrem low venous*left* · 13 of 37 slices shown]
[im 1/37]
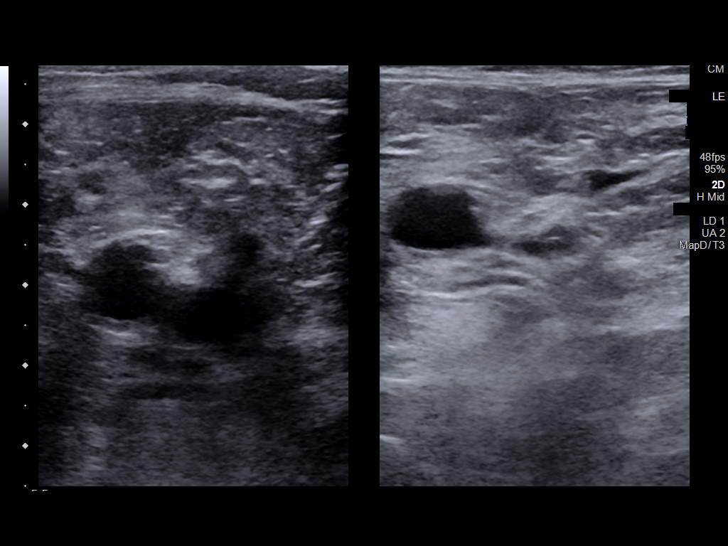
[im 4/37]
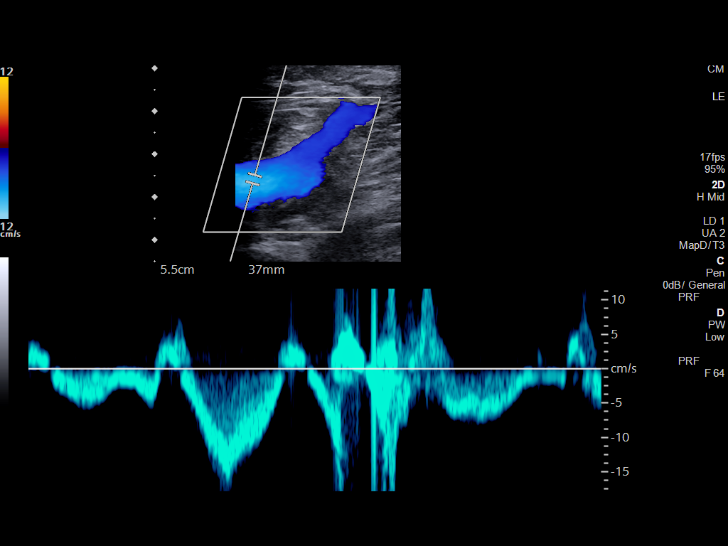
[im 7/37]
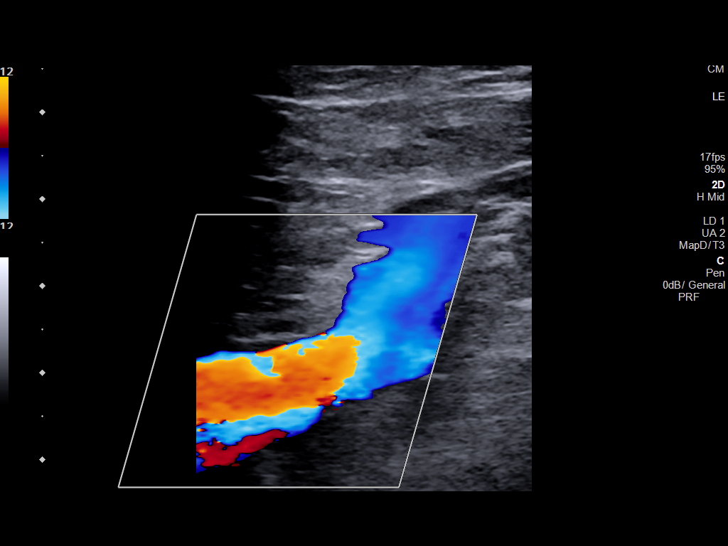
[im 10/37]
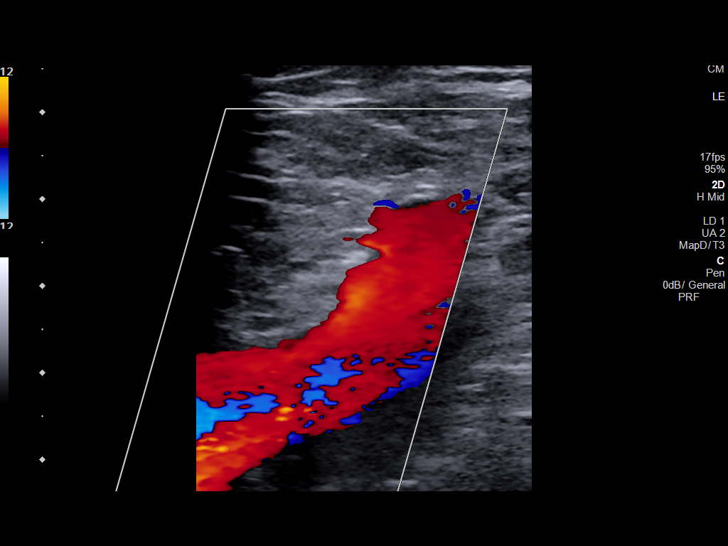
[im 13/37]
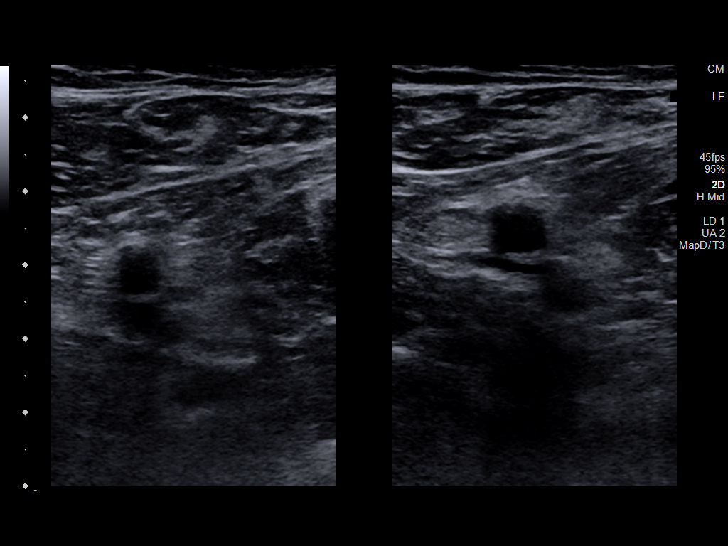
[im 16/37]
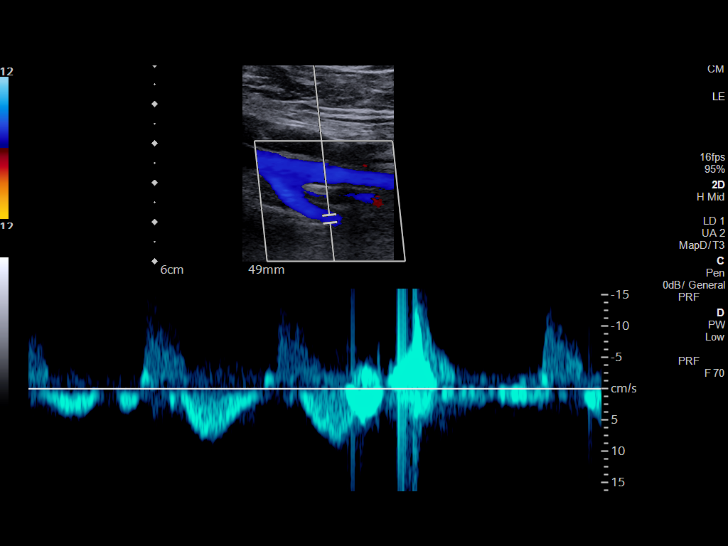
[im 19/37]
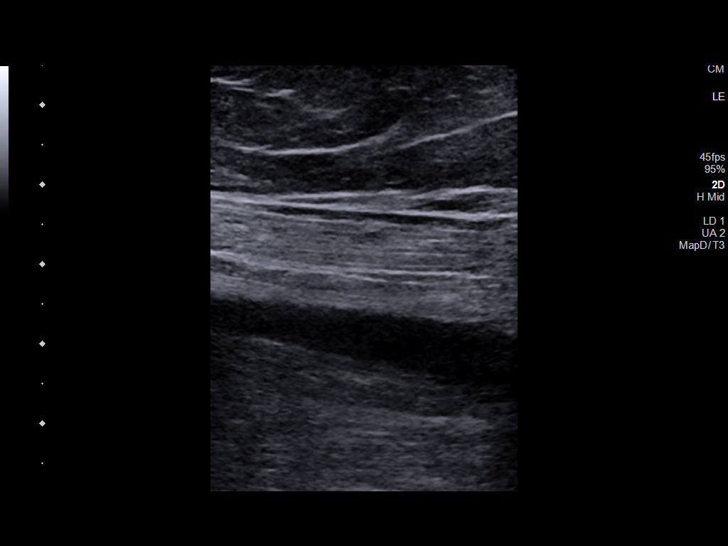
[im 21/37]
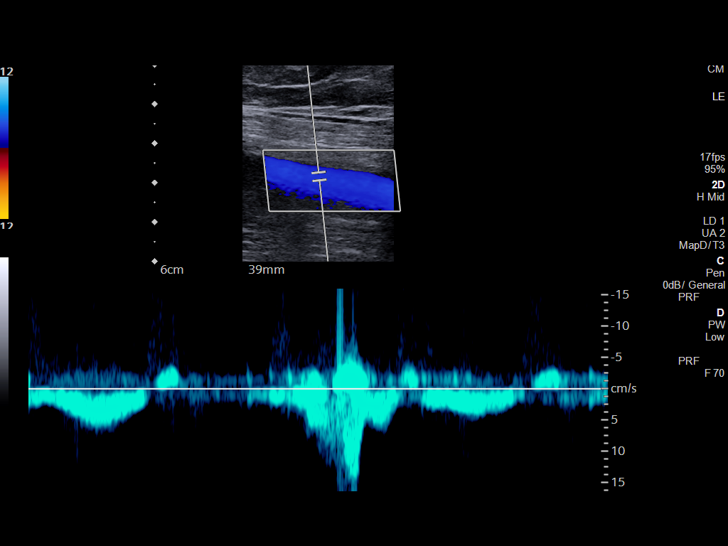
[im 24/37]
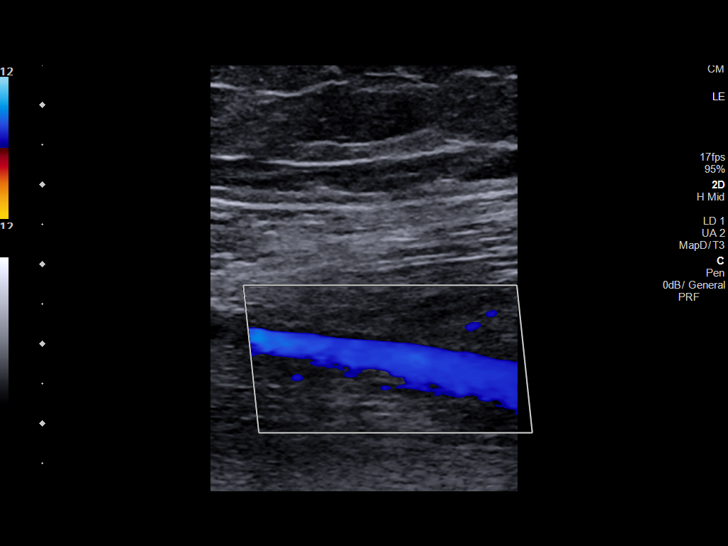
[im 27/37]
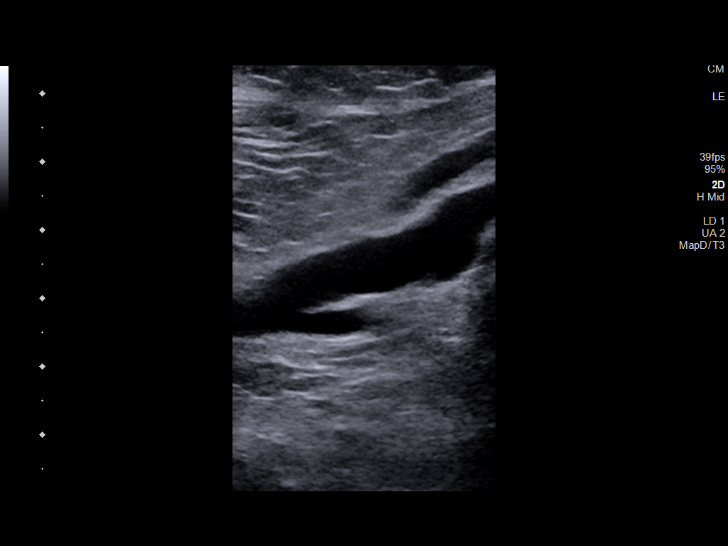
[im 30/37]
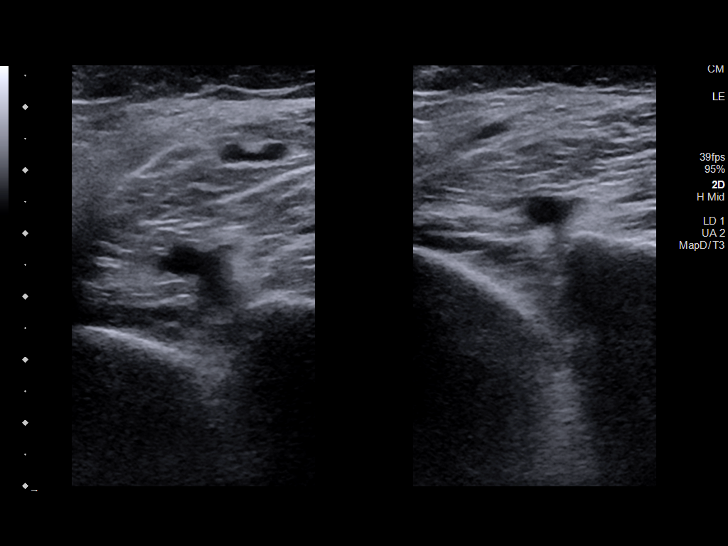
[im 33/37]
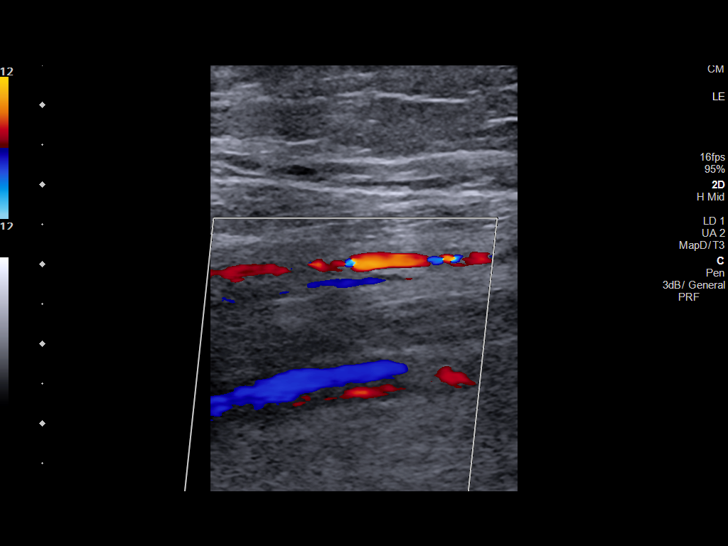
[im 37/37]
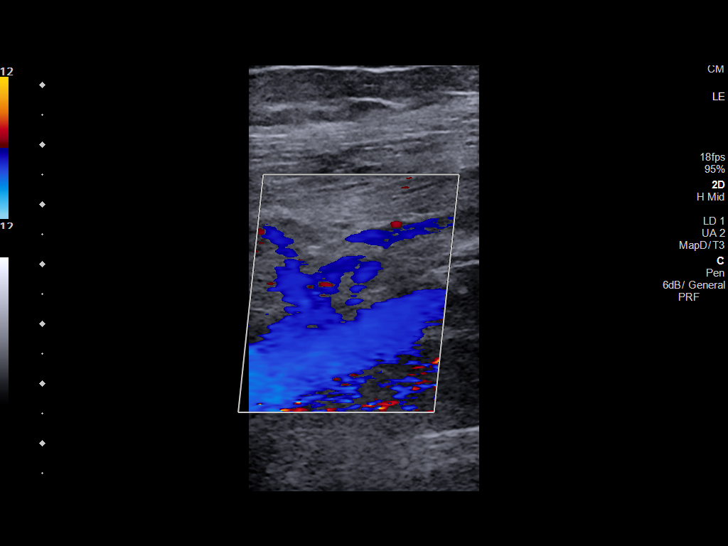

[13 of 24 positions shown; findings below may reference images not displayed]

FINDINGS: Contralateral Common Femoral Vein: Respiratory phasicity is normal
and symmetric with the symptomatic side. No evidence of thrombus.
Normal compressibility.

Common Femoral Vein: No evidence of thrombus. Normal
compressibility, respiratory phasicity and response to augmentation.

Saphenofemoral Junction: No evidence of thrombus. Normal
compressibility and flow on color Doppler imaging.

Profunda Femoral Vein: No evidence of thrombus. Normal
compressibility and flow on color Doppler imaging.

Femoral Vein: No evidence of thrombus. Normal compressibility,
respiratory phasicity and response to augmentation.

Popliteal Vein: No evidence of thrombus. Normal compressibility,
respiratory phasicity and response to augmentation.

Calf Veins: No evidence of thrombus. Normal compressibility and flow
on color Doppler imaging.

Superficial Great Saphenous Vein: No evidence of thrombus. Normal
compressibility.

Venous Reflux:  None.

Other Findings:  None.
IMPRESSION: No evidence of deep venous thrombosis in the left lower extremity.
Right common femoral vein also patent.

## 2022-04-11 ENCOUNTER — Other Ambulatory Visit: Payer: Self-pay | Admitting: Cardiology

## 2022-05-13 ENCOUNTER — Inpatient Hospital Stay: Payer: Medicare Other | Attending: Hematology

## 2022-05-13 VITALS — BP 132/80 | HR 75 | Temp 97.9°F | Resp 20

## 2022-05-13 DIAGNOSIS — Z853 Personal history of malignant neoplasm of breast: Secondary | ICD-10-CM | POA: Diagnosis present

## 2022-05-13 DIAGNOSIS — Z95828 Presence of other vascular implants and grafts: Secondary | ICD-10-CM

## 2022-05-13 MED ORDER — SODIUM CHLORIDE 0.9% FLUSH
10.0000 mL | INTRAVENOUS | Status: DC | PRN
  Administered 2022-05-13: 10 mL via INTRAVENOUS

## 2022-05-13 MED ORDER — HEPARIN SOD (PORK) LOCK FLUSH 100 UNIT/ML IV SOLN
500.0000 [IU] | Freq: Once | INTRAVENOUS | Status: AC
  Administered 2022-05-13: 500 [IU] via INTRAVENOUS

## 2022-05-13 NOTE — Progress Notes (Signed)
Patients port flushed without difficulty.  Good blood return noted with no bruising or swelling noted at site.  Band aid applied.  VSS with discharge and left in satisfactory condition with no s/s of distress noted.   

## 2022-05-13 NOTE — Patient Instructions (Signed)
[facility]  Discharge Instructions: Thank you for choosing [facility] to provide your oncology and hematology care.  If you have a lab appointment with the Cancer Center - please note that after April 8th, 2024, all labs will be drawn in the cancer center.  You do not have to check in or register with the main entrance as you have in the past but will complete your check-in in the cancer center.  Wear comfortable clothing and clothing appropriate for easy access to any Portacath or PICC line.   We strive to give you quality time with your provider. You may need to reschedule your appointment if you arrive late (15 or more minutes).  Arriving late affects you and other patients whose appointments are after yours.  Also, if you miss three or more appointments without notifying the office, you may be dismissed from the clinic at the provider's discretion.      For prescription refill requests, have your pharmacy contact our office and allow 72 hours for refills to be completed.    Today you received the following : port flush.       To help prevent nausea and vomiting after your treatment, we encourage you to take your nausea medication as directed.  BELOW ARE SYMPTOMS THAT SHOULD BE REPORTED IMMEDIATELY: *FEVER GREATER THAN 100.4 F (38 C) OR HIGHER *CHILLS OR SWEATING *NAUSEA AND VOMITING THAT IS NOT CONTROLLED WITH YOUR NAUSEA MEDICATION *UNUSUAL SHORTNESS OF BREATH *UNUSUAL BRUISING OR BLEEDING *URINARY PROBLEMS (pain or burning when urinating, or frequent urination) *BOWEL PROBLEMS (unusual diarrhea, constipation, pain near the anus) TENDERNESS IN MOUTH AND THROAT WITH OR WITHOUT PRESENCE OF ULCERS (sore throat, sores in mouth, or a toothache) UNUSUAL RASH, SWELLING OR PAIN  UNUSUAL VAGINAL DISCHARGE OR ITCHING   Items with * indicate a potential emergency and should be followed up as soon as possible or go to the Emergency Department if any problems  should occur.  Please show the CHEMOTHERAPY ALERT CARD or IMMUNOTHERAPY ALERT CARD at check-in to the Emergency Department and triage nurse.  Should you have questions after your visit or need to cancel or reschedule your appointment, please contact [facility] AT  [phone]  and follow the prompts.  Office hours are 8:00 a.m. to 4:30 p.m. Monday - Friday. Please note that voicemails left after 4:00 p.m. may not be returned until the following business day.  We are closed weekends and major holidays. You have access to a nurse at all times for urgent questions. Please call the main number to the clinic [phone] and follow the prompts.  For any non-urgent questions, you may also contact your provider using MyChart. We now offer e-Visits for anyone 18 and older to request care online for non-urgent symptoms. For details visit [email or web address].   Also download the MyChart app! Go to the app store, search "MyChart", open the app, select [facility], and log in with your MyChart username and password.   

## 2022-05-20 ENCOUNTER — Other Ambulatory Visit: Payer: Self-pay

## 2022-05-20 ENCOUNTER — Encounter (HOSPITAL_COMMUNITY): Payer: Self-pay

## 2022-05-20 ENCOUNTER — Emergency Department (HOSPITAL_COMMUNITY)
Admission: EM | Admit: 2022-05-20 | Discharge: 2022-05-20 | Disposition: A | Discharge status: Home / Self Care | Payer: Medicare Other | Attending: Emergency Medicine | Admitting: Emergency Medicine

## 2022-05-20 ENCOUNTER — Emergency Department (HOSPITAL_COMMUNITY): Payer: Medicare Other

## 2022-05-20 DIAGNOSIS — R0789 Other chest pain: Secondary | ICD-10-CM

## 2022-05-20 DIAGNOSIS — Z79899 Other long term (current) drug therapy: Secondary | ICD-10-CM | POA: Diagnosis not present

## 2022-05-20 DIAGNOSIS — I10 Essential (primary) hypertension: Secondary | ICD-10-CM | POA: Diagnosis not present

## 2022-05-20 DIAGNOSIS — N3001 Acute cystitis with hematuria: Secondary | ICD-10-CM | POA: Diagnosis not present

## 2022-05-20 DIAGNOSIS — Z853 Personal history of malignant neoplasm of breast: Secondary | ICD-10-CM | POA: Diagnosis not present

## 2022-05-20 LAB — CBC WITH DIFFERENTIAL/PLATELET
Abs Immature Granulocytes: 0.03 10*3/uL (ref 0.00–0.07)
Basophils Absolute: 0 10*3/uL (ref 0.0–0.1)
Basophils Relative: 0 %
Eosinophils Absolute: 0.1 10*3/uL (ref 0.0–0.5)
Eosinophils Relative: 1 %
HCT: 39.9 % (ref 36.0–46.0)
Hemoglobin: 13.2 g/dL (ref 12.0–15.0)
Immature Granulocytes: 0 %
Lymphocytes Relative: 10 %
Lymphs Abs: 0.9 10*3/uL (ref 0.7–4.0)
MCH: 29.9 pg (ref 26.0–34.0)
MCHC: 33.1 g/dL (ref 30.0–36.0)
MCV: 90.3 fL (ref 80.0–100.0)
Monocytes Absolute: 0.6 10*3/uL (ref 0.1–1.0)
Monocytes Relative: 7 %
Neutro Abs: 7.8 10*3/uL — ABNORMAL HIGH (ref 1.7–7.7)
Neutrophils Relative %: 82 %
Platelets: 198 10*3/uL (ref 150–400)
RBC: 4.42 MIL/uL (ref 3.87–5.11)
RDW: 13.6 % (ref 11.5–15.5)
WBC: 9.5 10*3/uL (ref 4.0–10.5)
nRBC: 0 % (ref 0.0–0.2)

## 2022-05-20 LAB — COMPREHENSIVE METABOLIC PANEL
ALT: 13 U/L (ref 0–44)
AST: 17 U/L (ref 15–41)
Albumin: 3.4 g/dL — ABNORMAL LOW (ref 3.5–5.0)
Alkaline Phosphatase: 71 U/L (ref 38–126)
Anion gap: 9 (ref 5–15)
BUN: 12 mg/dL (ref 8–23)
CO2: 24 mmol/L (ref 22–32)
Calcium: 8.4 mg/dL — ABNORMAL LOW (ref 8.9–10.3)
Chloride: 104 mmol/L (ref 98–111)
Creatinine, Ser: 0.89 mg/dL (ref 0.44–1.00)
GFR, Estimated: >60 mL/min (ref >60)
Glucose, Bld: 156 mg/dL — ABNORMAL HIGH (ref 70–99)
Potassium: 3.2 mmol/L — ABNORMAL LOW (ref 3.5–5.1)
Sodium: 137 mmol/L (ref 135–145)
Total Bilirubin: 1.1 mg/dL (ref 0.3–1.2)
Total Protein: 6.3 g/dL — ABNORMAL LOW (ref 6.5–8.1)

## 2022-05-20 LAB — D-DIMER, QUANTITATIVE: D-Dimer, Quant: 0.82 ug/mL-FEU — ABNORMAL HIGH (ref 0.00–0.50)

## 2022-05-20 LAB — URINALYSIS, ROUTINE W REFLEX MICROSCOPIC
Bilirubin Urine: NEGATIVE
Glucose, UA: NEGATIVE mg/dL
Ketones, ur: NEGATIVE mg/dL
Nitrite: NEGATIVE
Protein, ur: NEGATIVE mg/dL
Specific Gravity, Urine: 1.003 — ABNORMAL LOW (ref 1.005–1.030)
pH: 8 (ref 5.0–8.0)

## 2022-05-20 LAB — TROPONIN I (HIGH SENSITIVITY)
Troponin I (High Sensitivity): 5 ng/L (ref <18)
Troponin I (High Sensitivity): 5 ng/L (ref <18)

## 2022-05-20 MED ORDER — HYDROCODONE-ACETAMINOPHEN 5-325 MG PO TABS: ORAL_TABLET | ORAL | 0 refills | Status: DC

## 2022-05-20 MED ORDER — SODIUM CHLORIDE 0.9 % IV BOLUS
500.0000 mL | Freq: Once | INTRAVENOUS | Status: AC
  Administered 2022-05-20: 500 mL via INTRAVENOUS

## 2022-05-20 MED ORDER — CEPHALEXIN 500 MG PO CAPS: 500.0000 mg | ORAL_CAPSULE | Freq: Four times a day (QID) | ORAL | 0 refills | Status: DC

## 2022-05-20 NOTE — Discharge Instructions (Signed)
Follow-up with your family doctor next week for recheck. 

## 2022-05-20 NOTE — ED Notes (Signed)
Pt states upper leg/ groin pain. Small quarter size knot felt. MD notified.

## 2022-05-20 NOTE — ED Triage Notes (Signed)
Pt comes in from home with complaints of chest pain starting this morning upon wakening. Pt states yesterday she had urine frequency and diarrhea but denies fever.

## 2022-05-20 NOTE — ED Provider Notes (Signed)
[place] EMERGENCY DEPARTMENT AT [facility] Provider Note   CSN: [id number] Arrival date & time: 05/20/22  4098     History {Add pertinent medical, surgical, social history, OB history to HPI:1} Chief Complaint  Patient presents with   Chest Pain    Tiffany Sweeney is a 88 y.o. female.  Patient has a history of hypertension and breast cancer.  She presents with pain over her lower chest with inspiration   Chest Pain      Home Medications Prior to Admission medications   Medication Sig Start Date End Date Taking? Authorizing Provider  cephALEXin (KEFLEX) 500 MG capsule Take 1 capsule (500 mg total) by mouth 4 (four) times daily. 05/20/22  Yes Bethann Berkshire, MD  HYDROcodone-acetaminophen (NORCO/VICODIN) 5-325 MG tablet Take 1 every 6 hours for pain-not relieved by Tylenol or Motrin alone 05/20/22  Yes Bethann Berkshire, MD  amLODipine (NORVASC) 5 MG tablet TAKE ONE TABLET BY MOUTH ONCE DAILY. 04/22/21   Jonelle Sidle, MD  carvedilol (COREG) 6.25 MG tablet TAKE (1) TABLET BY MOUTH TWICE DAILY WITH MEALS. 04/11/22   Jonelle Sidle, MD  furosemide (LASIX) 20 MG tablet Take 1 tablet (20 mg total) by mouth daily as needed (for weight gain of 3 lbs/day or 5 lbs/week, or swelling). 10/30/20   Netta Neat., NP  losartan (COZAAR) 100 MG tablet Take 100 mg by mouth daily. 08/20/18   [provider]      Allergies    Statins    Review of Systems   Review of Systems  Cardiovascular:  Positive for chest pain.    Physical Exam Updated Vital Signs BP (!) 174/97   Pulse 89   Temp 99.5 F (37.5 C) (Oral)   Resp (!) 23   Ht 5\' 4"  (1.626 m)   Wt 74.8 kg   SpO2 95%   BMI 28.32 kg/m  Physical Exam  ED Results / Procedures / Treatments   Labs (all labs ordered are listed, but only abnormal results are displayed) Labs Reviewed  CBC WITH DIFFERENTIAL/PLATELET - Abnormal; Notable for the following components:      Result Value   Neutro Abs 7.8 (*)     All other components within normal limits  COMPREHENSIVE METABOLIC PANEL - Abnormal; Notable for the following components:   Potassium 3.2 (*)    Glucose, Bld 156 (*)    Calcium 8.4 (*)    Total Protein 6.3 (*)    Albumin 3.4 (*)    All other components within normal limits  D-DIMER, QUANTITATIVE - Abnormal; Notable for the following components:   D-Dimer, Quant 0.82 (*)    All other components within normal limits  URINALYSIS, ROUTINE W REFLEX MICROSCOPIC - Abnormal; Notable for the following components:   Color, Urine STRAW (*)    Specific Gravity, Urine 1.003 (*)    Hgb urine dipstick SMALL (*)    Leukocytes,Ua MODERATE (*)    Bacteria, UA FEW (*)    All other components within normal limits  URINE CULTURE  TROPONIN I (HIGH SENSITIVITY)  TROPONIN I (HIGH SENSITIVITY)    EKG EKG Interpretation  Date/Time:  Friday May 20 2022 09:59:42 EDT Ventricular Rate:  83 PR Interval:  190 QRS Duration: 98 QT Interval:  416 QTC Calculation: 489 R Axis:   -28 Text Interpretation: Sinus rhythm Borderline left axis deviation Borderline prolonged QT interval Confirmed by Bethann Berkshire [phone]) on 05/20/2022 1:38:35 PM  Radiology DG Chest Port 1 View  Result Date:  05/20/2022 CLINICAL DATA:  Chest pain. EXAM: PORTABLE CHEST 1 VIEW COMPARISON:  Chest x-ray dated October 21, 2020. FINDINGS: Unchanged right chest wall port catheter. The heart size and mediastinal contours are within normal limits. Normal pulmonary vascularity. No focal consolidation, pleural effusion, or pneumothorax. No acute osseous abnormality. IMPRESSION: No active disease. Electronically Signed   By: Obie Dredge M.D.   On: 05/20/2022 11:55    Procedures Procedures  {Document cardiac monitor, telemetry assessment procedure when appropriate:1}  Medications Ordered in ED Medications  sodium chloride 0.9 % bolus 500 mL (0 mLs Intravenous Stopped 05/20/22 1131)    ED Course/ Medical Decision Making/ A&P   {    Click here for ABCD2, HEART and other calculatorsREFRESH Note before signing :1}                          Medical Decision Making Amount and/or Complexity of Data Reviewed Labs: ordered. Radiology: ordered. ECG/medicine tests: ordered.  Risk Prescription drug management.   Patient with chest wall pain and UTI.  She is started on Keflex and given Vicodin for discomfort and will follow-up with her doctor next week  {Document critical care time when appropriate:1} {Document review of labs and clinical decision tools ie heart score, Chads2Vasc2 etc:1}  {Document your independent review of radiology images, and any outside records:1} {Document your discussion with family members, caretakers, and with consultants:1} {Document social determinants of health affecting pt's care:1} {Document your decision making why or why not admission, treatments were needed:1} Final Clinical Impression(s) / ED Diagnoses Final diagnoses:  Atypical chest pain  Acute cystitis with hematuria    Rx / DC Orders ED Discharge Orders          Ordered    cephALEXin (KEFLEX) 500 MG capsule  4 times daily        05/20/22 1345    HYDROcodone-acetaminophen (NORCO/VICODIN) 5-325 MG tablet        05/20/22 1345

## 2022-05-21 LAB — URINE CULTURE

## 2022-05-23 LAB — URINE CULTURE: Culture: >=100000 — AB

## 2022-05-24 ENCOUNTER — Telehealth (HOSPITAL_BASED_OUTPATIENT_CLINIC_OR_DEPARTMENT_OTHER): Payer: Self-pay | Admitting: *Deleted

## 2022-05-24 NOTE — Telephone Encounter (Signed)
Post ED Visit - Positive Culture Follow-up: Successful Patient Follow-Up  Culture assessed and recommendations reviewed by:  []  Enzo Bi, Pharm.D. []  Celedonio Miyamoto, Pharm.D., BCPS AQ-ID []  Garvin Fila, Pharm.D., BCPS []  Georgina Pillion, Pharm.D., BCPS []  [place], [place].D., BCPS, AAHIVP []  Estella Husk, Pharm.D., BCPS, AAHIVP []  Lysle Pearl, PharmD, BCPS []  Phillips Climes, PharmD, BCPS []  Agapito Games, PharmD, BCPS []  Verlan Friends, PharmD  Positive urine culture  []  Patient discharged without antimicrobial prescription and treatment is now indicated [x]  Organism is resistant to prescribed ED discharge antimicrobial []  Patient with positive blood cultures  Changes discussed with ED provider: Claudette Stapler New antibiotic prescription Cefdinir 300 mg BID for 5 days  Called to [facility] [phone]  Contacted patient, date 05/24/22, time 1030   Plan: Stop Keflex and start Cefdinir 300 mf BD for 5 days. Spoke to daughter and prescription called to to [facility] at [phone]   Bing Quarry 05/24/2022, 11:27 AM

## 2022-05-24 NOTE — Progress Notes (Signed)
ED Antimicrobial Stewardship Positive Culture Follow Up   Tiffany Sweeney is an 88 y.o. female who presented to [facility] on 05/20/2022 with a chief complaint of  Chief Complaint  Patient presents with   Chest Pain    Recent Results (from the past 720 hour(s))  Urine Culture     Status: Abnormal   Collection Time: 05/20/22 11:08 AM   Specimen: Urine, Clean Catch  Result Value Ref Range Status   Specimen Description   Final    URINE, CLEAN CATCH Performed at [facility], [address], [place], [place] [address]    Special Requests   Final    NONE Performed at [facility], [address], [place], [place] [address]    Culture >=100,000 COLONIES/mL HAFNIA SPECIES (A)  Final   Report Status 05/23/2022 FINAL  Final   Organism ID, Bacteria HAFNIA SPECIES (A)  Final      Susceptibility   Hafnia species - MIC*    AMPICILLIN RESISTANT Resistant     CEFTRIAXONE <=0.25 SENSITIVE Sensitive     CIPROFLOXACIN <=0.25 SENSITIVE Sensitive     GENTAMICIN <=1 SENSITIVE Sensitive     IMIPENEM 0.5 SENSITIVE Sensitive     NITROFURANTOIN <=16 SENSITIVE Sensitive     TRIMETH/SULFA <=20 SENSITIVE Sensitive     AMPICILLIN/SULBACTAM >=32 RESISTANT Resistant     PIP/TAZO 64 INTERMEDIATE Intermediate     * >=100,000 COLONIES/mL HAFNIA SPECIES    [x]  Treated with Keflex, organism resistant to prescribed antimicrobial []  Patient discharged originally without antimicrobial agent and treatment is now indicated  New antibiotic prescription: cefdinir 300 mg BID for 5 days   ED Provider: Claudette Stapler, PA-C    [name] 05/24/2022, 10:06 AM Clinical Pharmacist Monday - Friday phone -  [phone] Saturday - Sunday phone - [phone]

## 2022-06-17 ENCOUNTER — Ambulatory Visit: Payer: Medicare Other | Admitting: Adult Health

## 2022-06-17 ENCOUNTER — Encounter: Payer: Self-pay | Admitting: Adult Health

## 2022-06-17 VITALS — BP 138/78 | HR 75 | Ht 64.0 in | Wt 163.0 lb

## 2022-06-17 DIAGNOSIS — Z124 Encounter for screening for malignant neoplasm of cervix: Secondary | ICD-10-CM | POA: Diagnosis not present

## 2022-06-17 DIAGNOSIS — Z1330 Encounter for screening examination for mental health and behavioral disorders, unspecified: Secondary | ICD-10-CM | POA: Diagnosis not present

## 2022-06-17 DIAGNOSIS — Z90710 Acquired absence of both cervix and uterus: Secondary | ICD-10-CM

## 2022-06-17 DIAGNOSIS — N816 Rectocele: Secondary | ICD-10-CM

## 2022-06-17 DIAGNOSIS — N8111 Cystocele, midline: Secondary | ICD-10-CM | POA: Diagnosis not present

## 2022-06-17 DIAGNOSIS — K5900 Constipation, unspecified: Secondary | ICD-10-CM | POA: Insufficient documentation

## 2022-06-17 DIAGNOSIS — Z90721 Acquired absence of ovaries, unilateral: Secondary | ICD-10-CM

## 2022-06-17 MED ORDER — SENNOSIDES-DOCUSATE SODIUM 8.6-50 MG PO TABS: ORAL_TABLET | ORAL | 99 refills | Status: DC

## 2022-06-17 NOTE — Progress Notes (Signed)
  Subjective:     Patient ID: Tiffany Sweeney, female   DOB: [birth year], 88 y.o.   MRN: [id number]  HPI Tiffany Sweeney is a 88 year old white female, widowed, sp hysterectomy BSO, in complaining of feels like something has dropped. She has had some burning with urination and taking bactrim.  PCP is Dr [name].  Review of Systems "Something has dropped" Has + constipation and has to strain, does not go daily Has some burning with urination taking bactrim Has urge incontinence at night occasionally Reviewed past medical,surgical, social and family history. Reviewed medications and allergies.     Objective:   Physical Exam BP 138/78 (BP Location: Right Arm, Patient Position: Sitting, Cuff Size: Normal)   Pulse 75   Ht 5\' 4"  (1.626 m)   Wt 163 lb (73.9 kg)   BMI 27.98 kg/m     Skin warm and dry.Pelvic: external genitalia is normal in appearance no lesions, vagina:pale has mild cystocele,urethra has no lesions or masses noted, cervix and uterus are absent,adnexa: no masses or tenderness noted. Bladder is non tender and no masses felt.On rectal exam, no masses, has stool in vault, +rectocele. Examination chaperoned by Dr Elaina Pattee. AA is 0 Fall risk is low    06/17/2022   10:48 AM 12/30/2013   11:59 AM 12/23/2013    8:58 AM  Depression screen PHQ 2/9  Decreased Interest 0 0 0  Down, Depressed, Hopeless 0 0 0  PHQ - 2 Score 0 0 0  Altered sleeping 0    Tired, decreased energy 0    Change in appetite 0    Feeling bad or failure about yourself  0    Trouble concentrating 0    Moving slowly or fidgety/restless 0    Suicidal thoughts 0    PHQ-9 Score 0         06/17/2022   10:48 AM  GAD 7 : Generalized Anxiety Score  Nervous, Anxious, on Edge 0  Control/stop worrying 0  Worry too much - different things 0  Trouble relaxing 0  Restless 0  Easily annoyed or irritable 0  Afraid - awful might happen 0  Total GAD 7 Score 0     Assessment:     1. Rectocele +rectocele Given medical  explainer #4  2. Cystocele, midline mild  3. S/P hysterectomy with oophorectomy  4. Constipation, unspecified constipation type Has to strain, does not go daily     Will try senokot S 1-2 daily Meds ordered this encounter  Medications   senna-docusate (SENOKOT S) 8.6-50 MG tablet    Sig: Take 1-2 at bedtime    Dispense:  60 tablet    Refill:  PRN    Order Specific Question:   Supervising Provider    Answer:   Lazaro Arms [[id number]]    Plan:     Follow up prn

## 2022-08-12 ENCOUNTER — Inpatient Hospital Stay: Payer: Medicare Other

## 2022-08-15 ENCOUNTER — Inpatient Hospital Stay: Payer: Medicare Other | Attending: Hematology

## 2022-08-15 VITALS — BP 139/78 | HR 96 | Temp 97.7°F | Resp 18

## 2022-08-15 DIAGNOSIS — Z853 Personal history of malignant neoplasm of breast: Secondary | ICD-10-CM | POA: Diagnosis present

## 2022-08-15 DIAGNOSIS — Z171 Estrogen receptor negative status [ER-]: Secondary | ICD-10-CM

## 2022-08-15 DIAGNOSIS — Z95828 Presence of other vascular implants and grafts: Secondary | ICD-10-CM

## 2022-08-15 MED ORDER — SODIUM CHLORIDE 0.9% FLUSH
10.0000 mL | Freq: Once | INTRAVENOUS | Status: AC
  Administered 2022-08-15: 10 mL via INTRAVENOUS

## 2022-08-15 MED ORDER — HEPARIN SOD (PORK) LOCK FLUSH 100 UNIT/ML IV SOLN
500.0000 [IU] | Freq: Once | INTRAVENOUS | Status: AC
  Administered 2022-08-15: 500 [IU] via INTRAVENOUS

## 2022-08-15 NOTE — Progress Notes (Signed)
Patients port flushed without difficulty.  Good blood return noted with no bruising or swelling noted at site.  Band aid applied.  VSS with discharge and left in satisfactory condition with no s/s of distress noted.   

## 2022-08-22 ENCOUNTER — Telehealth: Payer: Self-pay | Admitting: Cardiology

## 2022-08-22 MED ORDER — CARVEDILOL 6.25 MG PO TABS: 6.2500 mg | ORAL_TABLET | Freq: Two times a day (BID) | ORAL | 9 refills | Status: DC

## 2022-08-22 NOTE — Telephone Encounter (Signed)
 *  STAT* If patient is at the pharmacy, call can be transferred to refill team.   1. Which medications need to be refilled? (please list name of each medication and dose if known) carvedilol (COREG) 6.25 MG tablet    2. Would you like to learn more about the convenience, safety, & potential cost savings by using the [facility]? No     3. Are you open to using the [facility] (Type [facility]. no    4. Which pharmacy/location (including street and city if local pharmacy) is medication to be sent to? [facility], Address: [address] [place], [place] [address] Phone: [phone]   5. Do they need a 30 day or 90 day supply? 90  days  Per last note, pt follow up; as needed

## 2022-08-22 NOTE — Telephone Encounter (Signed)
Medication filled.  

## 2022-10-31 ENCOUNTER — Ambulatory Visit: Payer: TRICARE For Life (TFL) | Admitting: Internal Medicine

## 2022-11-08 ENCOUNTER — Ambulatory Visit
Admission: EM | Admit: 2022-11-08 | Discharge: 2022-11-08 | Disposition: A | Discharge status: Home / Self Care | Payer: Medicare Other

## 2022-11-08 ENCOUNTER — Encounter: Payer: Self-pay | Admitting: Emergency Medicine

## 2022-11-08 DIAGNOSIS — R350 Frequency of micturition: Secondary | ICD-10-CM

## 2022-11-08 NOTE — ED Triage Notes (Signed)
Urinary frequency x3 days.

## 2022-11-08 NOTE — Discharge Instructions (Signed)
Patient unable to provide urine specimen.  Patient has elected to leave prior to providing urine specimen.  Follow-up if symptoms continue to persist.

## 2022-11-08 NOTE — ED Provider Notes (Signed)
[facility]    CSN: [id number] Arrival date & time: 11/08/22  1215      History   Chief Complaint No chief complaint on file.   HPI Tiffany Sweeney is a 88 y.o. female.   The history is provided by the patient.   Patient presents for complaints of urinary frequency that has been present for the past 3 days.  Patient states last evening, she got out of bed 4 times to urinate, states that she only usually gets up about 2-3 times.  She also states that she has had increased urinary frequency during the day.  Patient denies pain with urination, hesitancy, decreased urine stream, hematuria, low back pain, flank pain, or vaginal symptoms.  Patient denies prior history of recurrent urinary tract infections.  Past Medical History:  Diagnosis Date   Anxiety    Arthritis    Breast cancer (HCC)    Cardiomyopathy (HCC)    Coronary atherosclerosis    Mild, nonobstructive - cardiac catheterization October 2022   Essential hypertension    Family history of colon cancer    Family history of ovarian cancer    Family history of pancreatic cancer    Heart attack [facility])    Hyperlipidemia    Skin cancer    Patient stated  she has low grade skin cancer in right hand    Patient Active Problem List   Diagnosis Date Noted   S/P hysterectomy with oophorectomy 06/17/2022   Cystocele, midline 06/17/2022   Rectocele 06/17/2022   Constipation 06/17/2022   NSTEMI (non-ST elevated myocardial infarction) (HCC) 10/21/2020   Health care maintenance 11/02/2015   Blood glucose elevated 11/02/2015   OA (osteoarthritis) of hip 08/05/2015   Genetic testing 04/29/2014   Family history of ovarian cancer    Family history of colon cancer    Family history of pancreatic cancer    Breast cancer (HCC) 11/04/2013   Low back pain 11/12/2012    Past Surgical History:  Procedure Laterality Date   ABDOMINAL HYSTERECTOMY     CATARACT EXTRACTION W/PHACO Left 03/11/2013   Procedure: CATARACT  EXTRACTION PHACO AND INTRAOCULAR LENS PLACEMENT (IOC);  Surgeon: Gemma Payor, MD;  Location: [facility];  Service: Ophthalmology;  Laterality: Left;  CDE:7.54   CATARACT EXTRACTION W/PHACO Right 04/15/2013   Procedure: CATARACT EXTRACTION PHACO AND INTRAOCULAR LENS PLACEMENT (IOC);  Surgeon: Gemma Payor, MD;  Location: [facility];  Service: Ophthalmology;  Laterality: Right;  CDE 9.38   LEFT HEART CATH AND CORONARY ANGIOGRAPHY N/A 10/21/2020   Procedure: LEFT HEART CATH AND CORONARY ANGIOGRAPHY;  Surgeon: Orbie Pyo, MD;  Location: [facility];  Service: Cardiovascular;  Laterality: N/A;   MASTECTOMY Right    MASTECTOMY MODIFIED RADICAL Left 11/04/2013   Procedure: LEFT MASTECTOMY MODIFIED RADICAL;  Surgeon: Dalia Heading, MD;  Location: [facility];  Service: General;  Laterality: Left;   PORTACATH PLACEMENT Right 12/06/2013   Procedure: INSERTION PORT-A-CATH-Right subclavian;  Surgeon: Dalia Heading, MD;  Location: [facility];  Service: General;  Laterality: Right;   TOTAL HIP ARTHROPLASTY Right 08/05/2015   Procedure: RIGHT TOTAL HIP ARTHROPLASTY ANTERIOR APPROACH;  Surgeon: Ollen Gross, MD;  Location: [facility];  Service: Orthopedics;  Laterality: Right;    OB History     Gravida  4   Para  3   Term  3   Preterm      AB  1   Living  2      SAB  1  IAB      Ectopic      Multiple      Live Births               Home Medications    Prior to Admission medications   Medication Sig Start Date End Date Taking? Authorizing Provider  amLODipine (NORVASC) 5 MG tablet TAKE ONE TABLET BY MOUTH ONCE DAILY. 04/22/21   Jonelle Sidle, MD  carvedilol (COREG) 6.25 MG tablet Take 1 tablet (6.25 mg total) by mouth 2 (two) times daily with a meal. 08/22/22   Jonelle Sidle, MD  senna-docusate [facility] S) 8.6-50 MG tablet Take 1-2 at bedtime 06/17/22   Adline Potter, NP    Family History Family History  Problem Relation Age of Onset   Dementia Father        Died at 89 in an  accident   Diabetes Mother    Rectal cancer Mother 23       died from complications of DM   Pancreatic cancer Brother 73   Lung cancer Sister 80       "small oat cell"   Ovarian cancer Sister 54   Pancreatic cancer Sister    Cancer Cousin        maternal 1st cousin with either ovarian or uterine   Colon cancer Cousin        paternal first cousin dx at unknown age   Alcohol abuse Daughter     Social History Social History   Tobacco Use   Smoking status: Former    Current packs/day: 0.00    Average packs/day: 0.3 packs/day for 30.0 years (7.5 ttl pk-yrs)    Types: Cigarettes    Start date: 10/10/1956    Quit date: 03/08/1985    Years since quitting: 37.6   Smokeless tobacco: Never   Tobacco comments:    quit 25 years ago  Vaping Use   Vaping status: Never Used  Substance Use Topics   Alcohol use: Yes    Comment: very rarely   Drug use: No     Allergies   Statins   Review of Systems Review of Systems Per HPI  Physical Exam Triage Vital Signs ED Triage Vitals  Encounter Vitals Group     BP 11/08/22 1220 (!) 149/81     Systolic BP Percentile --      Diastolic BP Percentile --      Pulse Rate 11/08/22 1220 74     Resp 11/08/22 1220 18     Temp 11/08/22 1220 97.9 F (36.6 C)     Temp Source 11/08/22 1220 Oral     SpO2 11/08/22 1220 98 %     Weight --      Height --      Head Circumference --      Peak Flow --      Pain Score 11/08/22 1221 0     Pain Loc --      Pain Education --      Exclude from Growth Chart --    No data found.  Updated Vital Signs BP (!) 149/81 (BP Location: Left Arm)   Pulse 74   Temp 97.9 F (36.6 C) (Oral)   Resp 18   SpO2 98%   Visual Acuity Right Eye Distance:   Left Eye Distance:   Bilateral Distance:    Right Eye Near:   Left Eye Near:    Bilateral Near:     Physical Exam Vitals and nursing  note reviewed.  Constitutional:      General: She is not in acute distress.    Appearance: Normal appearance.  HENT:      Head: Normocephalic.  Eyes:     Extraocular Movements: Extraocular movements intact.     Pupils: Pupils are equal, round, and reactive to light.  Cardiovascular:     Rate and Rhythm: Normal rate and regular rhythm.     Pulses: Normal pulses.     Heart sounds: Normal heart sounds.  Pulmonary:     Effort: Pulmonary effort is normal.     Breath sounds: Normal breath sounds.  Abdominal:     General: Bowel sounds are normal.     Palpations: Abdomen is soft.     Tenderness: There is no abdominal tenderness. There is no right CVA tenderness or left CVA tenderness.  Musculoskeletal:     Cervical back: Normal range of motion.  Neurological:     General: No focal deficit present.     Mental Status: She is alert and oriented to person, place, and time.  Psychiatric:        Mood and Affect: Mood normal.        Behavior: Behavior normal.      UC Treatments / Results  Labs (all labs ordered are listed, but only abnormal results are displayed) Labs Reviewed  POCT URINALYSIS DIP (MANUAL ENTRY)    EKG   Radiology No results found.  Procedures Procedures (including critical care time)  Medications Ordered in UC Medications - No data to display  Initial Impression / Assessment and Plan / UC Course  I have reviewed the triage vital signs and the nursing notes.  Pertinent labs & imaging results that were available during my care of the patient were reviewed by me and considered in my medical decision making (see chart for details).  Patient attempted to provide urine specimen for urinalysis.  Patient was unable to urinate while in the clinic.  Patient states that she has decided that she will go home, and will follow-up if symptoms continue to persist..    Final Clinical Impressions(s) / UC Diagnoses   Final diagnoses:  Urinary frequency     Discharge Instructions      Patient unable to provide urine specimen.  Patient has elected to leave prior to providing urine  specimen.  Follow-up if symptoms continue to persist.     ED Prescriptions   None    PDMP not reviewed this encounter.   Abran Cantor, NP 11/08/22 1337

## 2022-11-10 ENCOUNTER — Other Ambulatory Visit: Payer: Self-pay

## 2022-11-10 ENCOUNTER — Ambulatory Visit
Admission: EM | Admit: 2022-11-10 | Discharge: 2022-11-10 | Disposition: A | Discharge status: Home / Self Care | Payer: Medicare Other | Attending: Family Medicine | Admitting: Family Medicine

## 2022-11-10 ENCOUNTER — Encounter: Payer: Self-pay | Admitting: Emergency Medicine

## 2022-11-10 DIAGNOSIS — N390 Urinary tract infection, site not specified: Secondary | ICD-10-CM | POA: Insufficient documentation

## 2022-11-10 LAB — POCT URINALYSIS DIP (MANUAL ENTRY)
Bilirubin, UA: NEGATIVE
Glucose, UA: NEGATIVE mg/dL
Ketones, POC UA: NEGATIVE mg/dL
Nitrite, UA: POSITIVE — AB
Spec Grav, UA: 1.015 (ref 1.010–1.025)
Urobilinogen, UA: 0.2 U/dL
pH, UA: 6 (ref 5.0–8.0)

## 2022-11-10 MED ORDER — CEPHALEXIN 500 MG PO CAPS: 500.0000 mg | ORAL_CAPSULE | Freq: Two times a day (BID) | ORAL | 0 refills | Status: DC

## 2022-11-10 NOTE — ED Provider Notes (Signed)
[facility]    CSN: [id number] Arrival date & time: 11/10/22  1235      History   Chief Complaint Chief Complaint  Patient presents with   Urinary Frequency    HPI Tiffany Sweeney is a 88 y.o. female.   Patient presenting today with urinary frequency, dysuria, hesitancy for the past 5 days.  Denies fever, chills, abdominal pain, nausea vomiting or diarrhea, vaginal symptoms.  So far not try anything over-the-counter for symptoms.    Past Medical History:  Diagnosis Date   Anxiety    Arthritis    Breast cancer (HCC)    Cardiomyopathy (HCC)    Coronary atherosclerosis    Mild, nonobstructive - cardiac catheterization October 2022   Essential hypertension    Family history of colon cancer    Family history of ovarian cancer    Family history of pancreatic cancer    Heart attack [facility])    Hyperlipidemia    Skin cancer    Patient stated  she has low grade skin cancer in right hand    Patient Active Problem List   Diagnosis Date Noted   S/P hysterectomy with oophorectomy 06/17/2022   Cystocele, midline 06/17/2022   Rectocele 06/17/2022   Constipation 06/17/2022   NSTEMI (non-ST elevated myocardial infarction) (HCC) 10/21/2020   Health care maintenance 11/02/2015   Blood glucose elevated 11/02/2015   OA (osteoarthritis) of hip 08/05/2015   Genetic testing 04/29/2014   Family history of ovarian cancer    Family history of colon cancer    Family history of pancreatic cancer    Breast cancer (HCC) 11/04/2013   Low back pain 11/12/2012    Past Surgical History:  Procedure Laterality Date   ABDOMINAL HYSTERECTOMY     CATARACT EXTRACTION W/PHACO Left 03/11/2013   Procedure: CATARACT EXTRACTION PHACO AND INTRAOCULAR LENS PLACEMENT (IOC);  Surgeon: Gemma Payor, MD;  Location: [facility];  Service: Ophthalmology;  Laterality: Left;  CDE:7.54   CATARACT EXTRACTION W/PHACO Right 04/15/2013   Procedure: CATARACT EXTRACTION PHACO AND INTRAOCULAR LENS PLACEMENT (IOC);   Surgeon: Gemma Payor, MD;  Location: [facility];  Service: Ophthalmology;  Laterality: Right;  CDE 9.38   LEFT HEART CATH AND CORONARY ANGIOGRAPHY N/A 10/21/2020   Procedure: LEFT HEART CATH AND CORONARY ANGIOGRAPHY;  Surgeon: Orbie Pyo, MD;  [facility];  Service: Cardiovascular;  Laterality: N/A;   MASTECTOMY Right    MASTECTOMY MODIFIED RADICAL Left 11/04/2013   Procedure: LEFT MASTECTOMY MODIFIED RADICAL;  Surgeon: Dalia Heading, MD;  Location: [facility];  Service: General;  Laterality: Left;   PORTACATH PLACEMENT Right 12/06/2013   Procedure: INSERTION PORT-A-CATH-Right subclavian;  Surgeon: Dalia Heading, MD;  Location: [facility];  Service: General;  Laterality: Right;   TOTAL HIP ARTHROPLASTY Right 08/05/2015   Procedure: RIGHT TOTAL HIP ARTHROPLASTY ANTERIOR APPROACH;  Surgeon: Ollen Gross, MD;  Location: [facility];  Service: Orthopedics;  Laterality: Right;    OB History     Gravida  4   Para  3   Term  3   Preterm      AB  1   Living  2      SAB  1   IAB      Ectopic      Multiple      Live Births               Home Medications    Prior to Admission medications   Medication Sig Start Date End  Date Taking? Authorizing Provider  cephALEXin (KEFLEX) 500 MG capsule Take 1 capsule (500 mg total) by mouth 2 (two) times daily. 11/10/22  Yes Particia Nearing, PA-C  amLODipine (NORVASC) 5 MG tablet TAKE ONE TABLET BY MOUTH ONCE DAILY. 04/22/21   Jonelle Sidle, MD  carvedilol (COREG) 6.25 MG tablet Take 1 tablet (6.25 mg total) by mouth 2 (two) times daily with a meal. 08/22/22   Jonelle Sidle, MD  senna-docusate [facility] S) 8.6-50 MG tablet Take 1-2 at bedtime 06/17/22   Adline Potter, NP    Family History Family History  Problem Relation Age of Onset   Dementia Father        Died at 90 in an accident   Diabetes Mother    Rectal cancer Mother 71       died from complications of DM   Pancreatic cancer Brother 70   Lung cancer  Sister 68       "small oat cell"   Ovarian cancer Sister 42   Pancreatic cancer Sister    Cancer Cousin        maternal 1st cousin with either ovarian or uterine   Colon cancer Cousin        paternal first cousin dx at unknown age   Alcohol abuse Daughter     Social History Social History   Tobacco Use   Smoking status: Former    Current packs/day: 0.00    Average packs/day: 0.3 packs/day for 30.0 years (7.5 ttl pk-yrs)    Types: Cigarettes    Start date: 10/10/1956    Quit date: 03/08/1985    Years since quitting: 37.7   Smokeless tobacco: Never   Tobacco comments:    quit 25 years ago  Vaping Use   Vaping status: Never Used  Substance Use Topics   Alcohol use: Yes    Comment: very rarely   Drug use: No     Allergies   Statins   Review of Systems Review of Systems Per HPI  Physical Exam Triage Vital Signs ED Triage Vitals  Encounter Vitals Group     BP 11/10/22 1334 133/63     Systolic BP Percentile --      Diastolic BP Percentile --      Pulse Rate 11/10/22 1334 71     Resp 11/10/22 1334 20     Temp 11/10/22 1334 98 F (36.7 C)     Temp Source 11/10/22 1334 Oral     SpO2 11/10/22 1334 97 %     Weight --      Height --      Head Circumference --      Peak Flow --      Pain Score 11/10/22 1333 0     Pain Loc --      Pain Education --      Exclude from Growth Chart --    No data found.  Updated Vital Signs BP 133/63 (BP Location: Right Arm)   Pulse 71   Temp 98 F (36.7 C) (Oral)   Resp 20   SpO2 97%   Visual Acuity Right Eye Distance:   Left Eye Distance:   Bilateral Distance:    Right Eye Near:   Left Eye Near:    Bilateral Near:     Physical Exam Vitals and nursing note reviewed.  Constitutional:      Appearance: Normal appearance. She is not ill-appearing.  HENT:     Head: Atraumatic.  Eyes:  Extraocular Movements: Extraocular movements intact.     Conjunctiva/sclera: Conjunctivae normal.  Cardiovascular:     Rate and  Rhythm: Normal rate and regular rhythm.     Heart sounds: Normal heart sounds.  Pulmonary:     Effort: Pulmonary effort is normal.     Breath sounds: Normal breath sounds.  Abdominal:     General: Bowel sounds are normal. There is no distension.     Palpations: Abdomen is soft.     Tenderness: There is no abdominal tenderness. There is no right CVA tenderness, left CVA tenderness or guarding.  Musculoskeletal:        General: Normal range of motion.     Cervical back: Normal range of motion and neck supple.  Skin:    General: Skin is warm and dry.  Neurological:     Mental Status: She is alert and oriented to person, place, and time.  Psychiatric:        Mood and Affect: Mood normal.        Thought Content: Thought content normal.        Judgment: Judgment normal.      UC Treatments / Results  Labs (all labs ordered are listed, but only abnormal results are displayed) Labs Reviewed  POCT URINALYSIS DIP (MANUAL ENTRY) - Abnormal; Notable for the following components:      Result Value   Clarity, UA hazy (*)    Blood, UA small (*)    Protein Ur, POC trace (*)    Nitrite, UA Positive (*)    Leukocytes, UA Small (1+) (*)    All other components within normal limits  URINE CULTURE    EKG   Radiology No results found.  Procedures Procedures (including critical care time)  Medications Ordered in UC Medications - No data to display  Initial Impression / Assessment and Plan / UC Course  I have reviewed the triage vital signs and the nursing notes.  Pertinent labs & imaging results that were available during my care of the patient were reviewed by me and considered in my medical decision making (see chart for details).     Urinalysis today with evidence of a urinary tract infection.  Urine specimen cup provided to patient as she is unable to provide enough urine for a urine culture here, states she has a nervous bladder.  Instructions given to not start empiric  antibiotics until able to give specimen and to bring specimen immediately after collecting.  Urine culture pending at that time, adjust if needed based on these results.  Final Clinical Impressions(s) / UC Diagnoses   Final diagnoses:  Acute lower UTI   Discharge Instructions   None    ED Prescriptions     Medication Sig Dispense Auth. Provider   cephALEXin (KEFLEX) 500 MG capsule Take 1 capsule (500 mg total) by mouth 2 (two) times daily. 10 capsule Particia Nearing, [place]      PDMP not reviewed this encounter.   Particia Nearing, [place] 11/10/22 1405

## 2022-11-10 NOTE — ED Triage Notes (Signed)
Pt reports urinary frequency x5 days. Pt reports dysuria at end of voiding x2 days. Denies any fever, abd pain, nausea.  Pt was seen for similar on 11/5 but was unable to void at that visit. Pt has provided urine sample at this time but will need additional sample for urine culture.

## 2022-11-10 NOTE — ED Notes (Addendum)
Pt unable to provide urine sample for culture. Reviewed process for returning sample to UC. Pt verbalized understanding. Pt also aware to not start abx until notified of culture results.

## 2022-11-11 ENCOUNTER — Inpatient Hospital Stay: Payer: Medicare Other | Attending: Hematology

## 2022-11-11 DIAGNOSIS — Z853 Personal history of malignant neoplasm of breast: Secondary | ICD-10-CM | POA: Insufficient documentation

## 2022-11-11 DIAGNOSIS — Z171 Estrogen receptor negative status [ER-]: Secondary | ICD-10-CM

## 2022-11-11 LAB — CBC WITH DIFFERENTIAL/PLATELET
Abs Immature Granulocytes: 0.02 10*3/uL (ref 0.00–0.07)
Basophils Absolute: 0 10*3/uL (ref 0.0–0.1)
Basophils Relative: 1 %
Eosinophils Absolute: 0.1 10*3/uL (ref 0.0–0.5)
Eosinophils Relative: 2 %
HCT: 39.3 % (ref 36.0–46.0)
Hemoglobin: 12.8 g/dL (ref 12.0–15.0)
Immature Granulocytes: 0 %
Lymphocytes Relative: 17 %
Lymphs Abs: 1.1 10*3/uL (ref 0.7–4.0)
MCH: 29.7 pg (ref 26.0–34.0)
MCHC: 32.6 g/dL (ref 30.0–36.0)
MCV: 91.2 fL (ref 80.0–100.0)
Monocytes Absolute: 0.5 10*3/uL (ref 0.1–1.0)
Monocytes Relative: 8 %
Neutro Abs: 4.7 10*3/uL (ref 1.7–7.7)
Neutrophils Relative %: 72 %
Platelets: 207 10*3/uL (ref 150–400)
RBC: 4.31 MIL/uL (ref 3.87–5.11)
RDW: 13 % (ref 11.5–15.5)
WBC: 6.6 10*3/uL (ref 4.0–10.5)
nRBC: 0 % (ref 0.0–0.2)

## 2022-11-11 LAB — COMPREHENSIVE METABOLIC PANEL
ALT: 13 U/L (ref 0–44)
AST: 16 U/L (ref 15–41)
Albumin: 3.4 g/dL — ABNORMAL LOW (ref 3.5–5.0)
Alkaline Phosphatase: 63 U/L (ref 38–126)
Anion gap: 8 (ref 5–15)
BUN: 18 mg/dL (ref 8–23)
CO2: 26 mmol/L (ref 22–32)
Calcium: 8.9 mg/dL (ref 8.9–10.3)
Chloride: 107 mmol/L (ref 98–111)
Creatinine, Ser: 0.75 mg/dL (ref 0.44–1.00)
GFR, Estimated: >60 mL/min (ref >60)
Glucose, Bld: 106 mg/dL — ABNORMAL HIGH (ref 70–99)
Potassium: 3.5 mmol/L (ref 3.5–5.1)
Sodium: 141 mmol/L (ref 135–145)
Total Bilirubin: 0.4 mg/dL (ref <1.2)
Total Protein: 6.3 g/dL — ABNORMAL LOW (ref 6.5–8.1)

## 2022-11-11 MED ORDER — SODIUM CHLORIDE 0.9% FLUSH
10.0000 mL | INTRAVENOUS | Status: DC | PRN
  Administered 2022-11-11: 10 mL via INTRAVENOUS

## 2022-11-11 MED ORDER — HEPARIN SOD (PORK) LOCK FLUSH 100 UNIT/ML IV SOLN
500.0000 [IU] | Freq: Once | INTRAVENOUS | Status: AC
  Administered 2022-11-11: 500 [IU] via INTRAVENOUS

## 2022-11-11 NOTE — Progress Notes (Signed)
Tiffany Sweeney presented for Portacath access and flush.  Portacath located right chest wall accessed with  H 20 needle.  Good blood return present. Portacath flushed with 20ml NS and 500U/93ml Heparin and needle removed intact. No bruising or swelling noted at the site  Procedure tolerated well and without incident.    Discharged from clinic ambulatory in stable condition. Alert and oriented x 3. F/U with [facility] as scheduled.

## 2022-11-12 LAB — URINE CULTURE: Culture: >=100000 — AB

## 2022-11-12 LAB — CANCER ANTIGEN 15-3: CA 15-3: 18.9 U/mL (ref 0.0–25.0)

## 2022-11-17 ENCOUNTER — Inpatient Hospital Stay: Payer: Medicare Other | Admitting: Hematology

## 2022-12-06 ENCOUNTER — Encounter: Payer: Self-pay | Admitting: Internal Medicine

## 2022-12-06 ENCOUNTER — Ambulatory Visit (INDEPENDENT_AMBULATORY_CARE_PROVIDER_SITE_OTHER): Payer: Medicare Other | Admitting: Internal Medicine

## 2022-12-06 VITALS — BP 116/50 | HR 66 | Ht 64.0 in | Wt 165.0 lb

## 2022-12-06 DIAGNOSIS — I10 Essential (primary) hypertension: Secondary | ICD-10-CM

## 2022-12-06 DIAGNOSIS — I5020 Unspecified systolic (congestive) heart failure: Secondary | ICD-10-CM

## 2022-12-06 DIAGNOSIS — C50512 Malignant neoplasm of lower-outer quadrant of left female breast: Secondary | ICD-10-CM | POA: Diagnosis not present

## 2022-12-06 DIAGNOSIS — I2510 Atherosclerotic heart disease of native coronary artery without angina pectoris: Secondary | ICD-10-CM

## 2022-12-06 DIAGNOSIS — Z171 Estrogen receptor negative status [ER-]: Secondary | ICD-10-CM

## 2022-12-06 MED ORDER — AMLODIPINE BESYLATE 5 MG PO TABS: 5.0000 mg | ORAL_TABLET | Freq: Every day | ORAL | 3 refills | Status: DC

## 2022-12-06 MED ORDER — LOSARTAN POTASSIUM 100 MG PO TABS: 100.0000 mg | ORAL_TABLET | Freq: Every day | ORAL | 3 refills | Status: DC

## 2022-12-06 MED ORDER — CARVEDILOL 6.25 MG PO TABS: 6.2500 mg | ORAL_TABLET | Freq: Two times a day (BID) | ORAL | 3 refills | Status: DC

## 2022-12-06 NOTE — Patient Instructions (Signed)
It was a pleasure to see you today.  Thank you for giving [place] the opportunity to be involved in your care.  Below is a brief recap of your visit and next steps.  We will plan to see you again in 6 months.  Summary You have established care today No medication changes were made. Refills provided. We will plan for follow up in 6 months

## 2022-12-06 NOTE — Progress Notes (Unsigned)
New Patient Office Visit  Subjective    Patient ID: Tiffany Sweeney, female    DOB: 1934-02-22  Age: 88 y.o. MRN: [id number]  CC:  Chief Complaint  Patient presents with   Establish Care    HPI Tiffany Sweeney presents to establish care.   Left breast cancer s/p left mastectomy (2015) CAD NSTEMI (2022) - mild obstructive CAD on LHC 10/2020 HTN- Carvedilol 6.25 mg twice daily, amlodipine 5 mg daily, losartan 100 mg daily HFrecEF -  HTN - good HLD - statin intolerant  Dr. [name] Fhx: multiple cancers Retited No TAD    Outpatient Encounter Medications as of 12/06/2022  Medication Sig   amLODipine (NORVASC) 5 MG tablet TAKE ONE TABLET BY MOUTH ONCE DAILY.   carvedilol (COREG) 6.25 MG tablet Take 1 tablet (6.25 mg total) by mouth 2 (two) times daily with a meal.   losartan (COZAAR) 100 MG tablet Take 100 mg by mouth daily.   cephALEXin (KEFLEX) 500 MG capsule Take 1 capsule (500 mg total) by mouth 2 (two) times daily. (Patient not taking: Reported on 12/06/2022)   LORazepam (ATIVAN) 0.5 MG tablet Take by mouth. (Patient not taking: Reported on 12/06/2022)   senna-docusate (SENOKOT S) 8.6-50 MG tablet Take 1-2 at bedtime (Patient not taking: Reported on 12/06/2022)   Facility-Administered Encounter Medications as of 12/06/2022  Medication   sodium chloride 0.9 % injection 10 mL   sodium chloride flush (NS) 0.9 % injection 10 mL   sodium chloride flush (NS) 0.9 % injection 10 mL    Past Medical History:  Diagnosis Date   Anxiety    Arthritis    Breast cancer (HCC)    Cardiomyopathy (HCC)    Coronary atherosclerosis    Mild, nonobstructive - cardiac catheterization October 2022   Essential hypertension    Family history of colon cancer    Family history of ovarian cancer    Family history of pancreatic cancer    Heart attack (HCC)    Hyperlipidemia    Skin cancer    Patient stated  she has low grade skin cancer in right hand    Past Surgical History:   Procedure Laterality Date   ABDOMINAL HYSTERECTOMY     CATARACT EXTRACTION W/PHACO Left 03/11/2013   Procedure: CATARACT EXTRACTION PHACO AND INTRAOCULAR LENS PLACEMENT (IOC);  Surgeon: Gemma Payor, MD;  Location: [facility];  Service: Ophthalmology;  Laterality: Left;  CDE:7.54   CATARACT EXTRACTION W/PHACO Right 04/15/2013   Procedure: CATARACT EXTRACTION PHACO AND INTRAOCULAR LENS PLACEMENT (IOC);  Surgeon: Gemma Payor, MD;  Location: [facility];  Service: Ophthalmology;  Laterality: Right;  CDE 9.38   LEFT HEART CATH AND CORONARY ANGIOGRAPHY N/A 10/21/2020   Procedure: LEFT HEART CATH AND CORONARY ANGIOGRAPHY;  Surgeon: Orbie Pyo, MD;  [facility];  Service: Cardiovascular;  Laterality: N/A;   MASTECTOMY Right    MASTECTOMY MODIFIED RADICAL Left 11/04/2013   Procedure: LEFT MASTECTOMY MODIFIED RADICAL;  Surgeon: Dalia Heading, MD;  Location: [facility];  Service: General;  Laterality: Left;   PORTACATH PLACEMENT Right 12/06/2013   Procedure: INSERTION PORT-A-CATH-Right subclavian;  Surgeon: Dalia Heading, MD;  Location: [facility];  Service: General;  Laterality: Right;   TOTAL HIP ARTHROPLASTY Right 08/05/2015   Procedure: RIGHT TOTAL HIP ARTHROPLASTY ANTERIOR APPROACH;  Surgeon: Ollen Gross, MD;  Location: [facility];  Service: Orthopedics;  Laterality: Right;    Family History  Problem Relation Age of Onset   Dementia Father  Died at 5 in an accident   Diabetes Mother    Rectal cancer Mother 21       died from complications of DM   Pancreatic cancer Brother 61   Lung cancer Sister 25       "small oat cell"   Ovarian cancer Sister 20   Pancreatic cancer Sister    Cancer Cousin        maternal 1st cousin with either ovarian or uterine   Colon cancer Cousin        paternal first cousin dx at unknown age   Alcohol abuse Daughter     Social History   Socioeconomic History   Marital status: Widowed    Spouse name: Not on file   Number of children: 3   Years of  education: Not on file   Highest education level: Not on file  Occupational History   Not on file  Tobacco Use   Smoking status: Former    Current packs/day: 0.00    Average packs/day: 0.3 packs/day for 30.0 years (7.5 ttl pk-yrs)    Types: Cigarettes    Start date: 10/10/1956    Quit date: 03/08/1985    Years since quitting: 37.7   Smokeless tobacco: Never   Tobacco comments:    quit 25 years ago  Vaping Use   Vaping status: Never Used  Substance and Sexual Activity   Alcohol use: Yes    Comment: very rarely   Drug use: No   Sexual activity: Not Currently    Birth control/protection: Surgical    Comment: hyst  Other Topics Concern   Not on file  Social History Narrative   Not on file   Social Determinants of Health   Financial Resource Strain: Low Risk  (06/17/2022)   Overall Financial Resource Strain (CARDIA)    Difficulty of Paying Living Expenses: Not hard at all  Food Insecurity: No Food Insecurity (06/17/2022)   Hunger Vital Sign    Worried About Running Out of Food in the Last Year: Never true    Ran Out of Food in the Last Year: Never true  Transportation Needs: No Transportation Needs (06/17/2022)   PRAPARE - Administrator, Civil Service (Medical): No    Lack of Transportation (Non-Medical): No  Physical Activity: Sufficiently Active (06/17/2022)   Exercise Vital Sign    Days of Exercise per Week: 3 days    Minutes of Exercise per Session: 120 min  Stress: No Stress Concern Present (06/17/2022)   [employer] of Occupational Health - Occupational Stress Questionnaire    Feeling of Stress : Only a little  Social Connections: Unknown (06/17/2022)   Social Connection and Isolation Panel [NHANES]    Frequency of Communication with Friends and Family: Once a week    Frequency of Social Gatherings with Friends and Family: More than three times a week    Attends Religious Services: Patient declined    Database administrator or Organizations: Patient  declined    Attends Banker Meetings: Patient declined    Marital Status: Widowed  Intimate Partner Violence: Not At Risk (06/17/2022)   Humiliation, Afraid, Rape, and Kick questionnaire    Fear of Current or Ex-Partner: No    Emotionally Abused: No    Physically Abused: No    Sexually Abused: No    ROS      Objective    BP (!) 116/50 (BP Location: Left Arm, Patient Position: Sitting, Cuff Size: Normal)  Pulse 66   Ht 5\' 4"  (1.626 m)   Wt 165 lb (74.8 kg)   SpO2 96%   BMI 28.32 kg/m   Physical Exam  {Labs (Optional):23779}    Assessment & Plan:   Problem List Items Addressed This Visit   None   No follow-ups on file.   Billie Lade, MD

## 2022-12-08 DIAGNOSIS — I10 Essential (primary) hypertension: Secondary | ICD-10-CM | POA: Insufficient documentation

## 2022-12-08 DIAGNOSIS — I5020 Unspecified systolic (congestive) heart failure: Secondary | ICD-10-CM | POA: Insufficient documentation

## 2022-12-08 DIAGNOSIS — I2510 Atherosclerotic heart disease of native coronary artery without angina pectoris: Secondary | ICD-10-CM | POA: Insufficient documentation

## 2022-12-08 NOTE — Assessment & Plan Note (Signed)
EF recovered on latest TTE from February 2023.  She is currently prescribed losartan, carvedilol, and amlodipine.  Lower extremity lymphedema is noted on exam today but she is otherwise euvolemic.  Previously prescribed Lasix for as needed use but it appears this was discontinued due to infrequent use.  Not actively followed by cardiology, last seen by Dr. Diona Browner in August 2023.

## 2022-12-08 NOTE — Assessment & Plan Note (Signed)
History of stage I triple negative left breast cancer.  Underwent left mastectomy in 2015.  Completed 3 cycles of chemotherapy.  She is followed by oncology on an annual basis and has an appointment scheduled 12/16.  Remote history of right breast cancer s/p right mastectomy (1997).

## 2022-12-08 NOTE — Assessment & Plan Note (Signed)
She is currently prescribed amlodipine 5 mg daily, losartan 100 mg daily, and carvedilol 6.25 mg twice daily.  HTN is adequately controlled on this regimen.  No medication changes are indicated today.  Refills provided.

## 2022-12-08 NOTE — Assessment & Plan Note (Signed)
Denies recent chest pain.  History of NSTEMI in October 2022.  Underwent LHC, revealing mild obstructive CAD.  Not currently on ASA.  History of statin intolerance.  Currently prescribed carvedilol.  No medication changes are indicated today.

## 2022-12-18 NOTE — Progress Notes (Signed)
[facility] [address], [place] [address]    Clinic Day:  12/19/2022  Referring physician: Gareth Morgan, MD  Patient Care Team: Billie Lade, MD as PCP - General (Internal Medicine) Jonelle Sidle, MD as PCP - Cardiology (Cardiology) Josph Macho, MD as Consulting Physician (Oncology) Franky Macho, MD as Consulting Physician (General Surgery)   ASSESSMENT & PLAN:   Assessment:  1.  Stage I (T1c N0) left TNBC: - Left mastectomy and lymph node dissection on 11/04/2013, 1.8 cm IDC, 0/9 lymph nodes positive - 3 cycles of TC in the adjuvant setting, fourth cycle not given due to fatigue and neuropathy completed on 02/03/2014 - Genetic testing negative in April 2016  2.  Right breast cancer: - S/p right mastectomy in 1997  Plan:  1.  Stage I (T1c N0) left breast TNBC: - Physical exam: Bilateral mastectomy sites with no palpable masses or nodules.  No adenopathy palpable. - Reviewed labs from 11/11/2022: Normal LFTs and creatinine.  CBC normal.  CA 15-3 was normal. - She would like to continue to follow-up with [place].  She will come back in 1 year with repeat labs and exam.  Orders Placed This Encounter  Procedures   CBC with Differential    Standing Status:   Future    Expected Date:   12/11/2023    Expiration Date:   12/19/2023   Comprehensive metabolic panel    Standing Status:   Future    Expected Date:   12/11/2023    Expiration Date:   12/19/2023   Cancer antigen 15-3    Standing Status:   Future    Expected Date:   12/11/2023    Expiration Date:   12/19/2023      I,Katie Daubenspeck,acting as a scribe for Tiffany Massed, MD.,have documented all relevant documentation on the behalf of Tiffany Massed, MD,as directed by  Tiffany Massed, MD while in the presence of Tiffany Massed, MD.   I, Tiffany Massed MD, have reviewed the above documentation for accuracy and completeness, and I agree with the  above.   Tiffany Massed, MD   12/16/20248:23 AM  CHIEF COMPLAINT:   Diagnosis: left breast cancer, triple negative   Cancer Staging  Breast cancer (HCC) Staging form: Breast, AJCC 7th Edition - Clinical: Stage IA (T1a, N0, M0) - Unsigned    Prior Therapy: 1. Left mastectomy, 11/04/13 2. Adjuvant TC, 3 cycles, completed 02/03/14  Current Therapy:  surveillance   HISTORY OF PRESENT ILLNESS:   Oncology History  Breast cancer (HCC)  07/25/2013 Imaging   Plain film of the lumbar spine, complete 4 view. Chronic lumbar degenerative change most prominent at L4-5. No acute abnormality and no change from prior study   10/21/2013 Initial Diagnosis   Left breast cancer, core biopsy, ER negative, PR negative, HER-2/neu negative, Ki-67 of 13%, infiltrating duct cell   11/04/2013 Surgery   Left modified radical mastectomy, 1.8 cm infiltrating duct cell carcinoma, ER negative, PR negative, HER-2/neu not overexpressed, 9 lymph nodes negative. Stage TI cN0 M0.   12/06/2013 Imaging   Portable chest x-ray showing no pneumothorax no lung edema or consolidation Port-A-Cath tip in the SVC.   12/23/2013 - 02/03/2014 Chemotherapy   Cycle 1 of TC initiated on 12/23/2013, patient completed 3 cycles of prescirbed therapy      INTERVAL HISTORY:   Tiffany Sweeney is a 88 y.o. female presenting to clinic today for follow up of left breast cancer. She was last seen by me  on 11/11/21.  Today, she states that she is doing well overall. Her appetite level is at 100%. Her energy level is at 75%.  PAST MEDICAL HISTORY:   Past Medical History: Past Medical History:  Diagnosis Date   Anxiety    Arthritis    Breast cancer (HCC)    Cardiomyopathy (HCC)    Coronary atherosclerosis    Mild, nonobstructive - cardiac catheterization October 2022   Essential hypertension    Family history of colon cancer    Family history of ovarian cancer    Family history of pancreatic cancer    Heart attack (HCC)     Hyperlipidemia    Skin cancer    Patient stated  she has low grade skin cancer in right hand    Surgical History: Past Surgical History:  Procedure Laterality Date   ABDOMINAL HYSTERECTOMY     CATARACT EXTRACTION W/PHACO Left 03/11/2013   Procedure: CATARACT EXTRACTION PHACO AND INTRAOCULAR LENS PLACEMENT (IOC);  Surgeon: Gemma Payor, MD;  Location: [facility];  Service: Ophthalmology;  Laterality: Left;  CDE:7.54   CATARACT EXTRACTION W/PHACO Right 04/15/2013   Procedure: CATARACT EXTRACTION PHACO AND INTRAOCULAR LENS PLACEMENT (IOC);  Surgeon: Gemma Payor, MD;  Location: [facility];  Service: Ophthalmology;  Laterality: Right;  CDE 9.38   LEFT HEART CATH AND CORONARY ANGIOGRAPHY N/A 10/21/2020   Procedure: LEFT HEART CATH AND CORONARY ANGIOGRAPHY;  Surgeon: Orbie Pyo, MD;  Location: [facility];  Service: Cardiovascular;  Laterality: N/A;   MASTECTOMY Right    MASTECTOMY MODIFIED RADICAL Left 11/04/2013   Procedure: LEFT MASTECTOMY MODIFIED RADICAL;  Surgeon: Dalia Heading, MD;  Location: [facility];  Service: General;  Laterality: Left;   PORTACATH PLACEMENT Right 12/06/2013   Procedure: INSERTION PORT-A-CATH-Right subclavian;  Surgeon: Dalia Heading, MD;  Location: [facility];  Service: General;  Laterality: Right;   TOTAL HIP ARTHROPLASTY Right 08/05/2015   Procedure: RIGHT TOTAL HIP ARTHROPLASTY ANTERIOR APPROACH;  Surgeon: Ollen Gross, MD;  Location: [facility];  Service: Orthopedics;  Laterality: Right;    Social History: Social History   Socioeconomic History   Marital status: Widowed    Spouse name: Not on file   Number of children: 3   Years of education: Not on file   Highest education level: Not on file  Occupational History   Not on file  Tobacco Use   Smoking status: Former    Current packs/day: 0.00    Average packs/day: 0.3 packs/day for 30.0 years (7.5 ttl pk-yrs)    Types: Cigarettes    Start date: 10/10/1956    Quit date: 03/08/1985    Years since quitting: 37.8    Smokeless tobacco: Never   Tobacco comments:    quit 25 years ago  Vaping Use   Vaping status: Never Used  Substance and Sexual Activity   Alcohol use: Yes    Comment: very rarely   Drug use: No   Sexual activity: Not Currently    Birth control/protection: Surgical    Comment: hyst  Other Topics Concern   Not on file  Social History Narrative   Not on file   Social Drivers of Health   Financial Resource Strain: Low Risk  (06/17/2022)   Overall Financial Resource Strain (CARDIA)    Difficulty of Paying Living Expenses: Not hard at all  Food Insecurity: No Food Insecurity (06/17/2022)   Hunger Vital Sign    Worried About Running Out of Food in the Last Year: Never true  Ran Out of Food in the Last Year: Never true  Transportation Needs: No Transportation Needs (06/17/2022)   PRAPARE - Administrator, Civil Service (Medical): No    Lack of Transportation (Non-Medical): No  Physical Activity: Sufficiently Active (06/17/2022)   Exercise Vital Sign    Days of Exercise per Week: 3 days    Minutes of Exercise per Session: 120 min  Stress: No Stress Concern Present (06/17/2022)   [employer] of Occupational Health - Occupational Stress Questionnaire    Feeling of Stress : Only a little  Social Connections: Unknown (06/17/2022)   Social Connection and Isolation Panel [NHANES]    Frequency of Communication with Friends and Family: Once a week    Frequency of Social Gatherings with Friends and Family: More than three times a week    Attends Religious Services: Patient declined    Database administrator or Organizations: Patient declined    Attends Banker Meetings: Patient declined    Marital Status: Widowed  Intimate Partner Violence: Not At Risk (06/17/2022)   Humiliation, Afraid, Rape, and Kick questionnaire    Fear of Current or Ex-Partner: No    Emotionally Abused: No    Physically Abused: No    Sexually Abused: No    Family History: Family  History  Problem Relation Age of Onset   Dementia Father        Died at 2 in an accident   Diabetes Mother    Rectal cancer Mother 80       died from complications of DM   Pancreatic cancer Brother 37   Lung cancer Sister 1       "small oat cell"   Ovarian cancer Sister 87   Pancreatic cancer Sister    Cancer Cousin        maternal 1st cousin with either ovarian or uterine   Colon cancer Cousin        paternal first cousin dx at unknown age   Alcohol abuse Daughter     Current Medications:  Current Outpatient Medications:    amLODipine (NORVASC) 5 MG tablet, Take 1 tablet (5 mg total) by mouth daily., Disp: 90 tablet, Rfl: 3   carvedilol (COREG) 6.25 MG tablet, Take 1 tablet (6.25 mg total) by mouth 2 (two) times daily with a meal., Disp: 180 tablet, Rfl: 3   losartan (COZAAR) 100 MG tablet, Take 1 tablet (100 mg total) by mouth daily., Disp: 90 tablet, Rfl: 3 No current facility-administered medications for this visit.  Facility-Administered Medications Ordered in Other Visits:    sodium chloride 0.9 % injection 10 mL, 10 mL, Intravenous, PRN, Merilynn Finland, Tammy A, RN, 10 mL at 08/26/14 1333   sodium chloride flush (NS) 0.9 % injection 10 mL, 10 mL, Intravenous, PRN, Pen[place], Novella Olive, MD, 10 mL at 11/02/15 1117   sodium chloride flush (NS) 0.9 % injection 10 mL, 10 mL, Intravenous, PRN, Hubbard Hartshorn, NP, 10 mL at 08/16/16 1125   Allergies: Allergies  Allergen Reactions   Statins Other (See Comments)    Pt states she has bone pain when on statin therapy.    REVIEW OF SYSTEMS:   Review of Systems  Constitutional:  Negative for chills, fatigue and fever.  HENT:   Negative for lump/mass, mouth sores, nosebleeds, sore throat and trouble swallowing.   Eyes:  Negative for eye problems.  Respiratory:  Positive for shortness of breath. Negative for cough.   Cardiovascular:  Negative for chest pain,  leg swelling and palpitations.  Gastrointestinal:  Positive for  constipation. Negative for abdominal pain, diarrhea, nausea and vomiting.  Genitourinary:  Negative for bladder incontinence, difficulty urinating, dysuria, frequency, hematuria and nocturia.   Musculoskeletal:  Negative for arthralgias, back pain, flank pain, myalgias and neck pain.  Skin:  Negative for itching and rash.  Neurological:  Positive for numbness. Negative for dizziness and headaches.  Hematological:  Does not bruise/bleed easily.  Psychiatric/Behavioral:  Negative for depression, sleep disturbance and suicidal ideas. The patient is not nervous/anxious.   All other systems reviewed and are negative.    VITALS:   Blood pressure 128/63, pulse 77, temperature 98.1 F (36.7 C), temperature source Oral, resp. rate 16, weight 163 lb (73.9 kg), SpO2 98%.  Wt Readings from Last 3 Encounters:  12/19/22 163 lb (73.9 kg)  12/06/22 165 lb (74.8 kg)  11/11/22 164 lb (74.4 kg)    Body mass index is 27.98 kg/m.  Performance status (ECOG): 1 - Symptomatic but completely ambulatory  PHYSICAL EXAM:   Physical Exam Vitals and nursing note reviewed. Exam conducted with a chaperone present.  Constitutional:      Appearance: Normal appearance.  Cardiovascular:     Rate and Rhythm: Normal rate and regular rhythm.     Pulses: Normal pulses.     Heart sounds: Normal heart sounds.  Pulmonary:     Effort: Pulmonary effort is normal.     Breath sounds: Normal breath sounds.  Abdominal:     Palpations: Abdomen is soft. There is no hepatomegaly, splenomegaly or mass.     Tenderness: There is no abdominal tenderness.  Musculoskeletal:     Right lower leg: Edema present.     Left lower leg: Edema present.  Lymphadenopathy:     Cervical: No cervical adenopathy.     Right cervical: No superficial, deep or posterior cervical adenopathy.    Left cervical: No superficial, deep or posterior cervical adenopathy.     Upper Body:     Right upper body: No supraclavicular or axillary adenopathy.      Left upper body: No supraclavicular or axillary adenopathy.  Neurological:     General: No focal deficit present.     Mental Status: She is alert and oriented to person, place, and time.  Psychiatric:        Mood and Affect: Mood normal.        Behavior: Behavior normal.     LABS:      Latest Ref Rng & Units 11/11/2022    8:49 AM 05/20/2022   10:08 AM 11/04/2021   12:11 PM  CBC  WBC 4.0 - 10.5 K/uL 6.6  9.5  6.0   Hemoglobin 12.0 - 15.0 g/dL 08.6  57.8  46.9   Hematocrit 36.0 - 46.0 % 39.3  39.9  39.9   Platelets 150 - 400 K/uL 207  198  203       Latest Ref Rng & Units 11/11/2022    8:49 AM 05/20/2022   10:08 AM 11/04/2021   12:11 PM  CMP  Glucose 70 - 99 mg/dL 629  528  413   BUN 8 - 23 mg/dL 18  12  15    Creatinine 0.44 - 1.00 mg/dL 2.44  0.10  2.72   Sodium 135 - 145 mmol/L 141  137  141   Potassium 3.5 - 5.1 mmol/L 3.5  3.2  3.5   Chloride 98 - 111 mmol/L 107  104  106   CO2 22 - 32  mmol/L 26  24  27    Calcium 8.9 - 10.3 mg/dL 8.9  8.4  8.5   Total Protein 6.5 - 8.1 g/dL 6.3  6.3  6.0   Total Bilirubin <1.2 mg/dL 0.4  1.1  0.4   Alkaline Phos 38 - 126 U/L 63  71  66   AST 15 - 41 U/L 16  17  16    ALT 0 - 44 U/L 13  13  13       Lab Results  Component Value Date   CEA 1.3 11/19/2013   /  CEA  Date Value Ref Range Status  11/19/2013 1.3 0.0 - 5.0 ng/mL Final    Comment:    Performed at [facility]   No results found for: "PSA1" No results found for: "CAN199" No results found for: "CAN125"  No results found for: "TOTALPROTELP", "ALBUMINELP", "A1GS", "A2GS", "BETS", "BETA2SER", "GAMS", "MSPIKE", "SPEI" No results found for: "TIBC", "FERRITIN", "IRONPCTSAT" No results found for: "LDH"   STUDIES:   No results found.

## 2022-12-19 ENCOUNTER — Inpatient Hospital Stay: Payer: Medicare Other | Attending: Hematology | Admitting: Hematology

## 2022-12-19 VITALS — BP 128/63 | HR 77 | Temp 98.1°F | Resp 16 | Wt 163.0 lb

## 2022-12-19 DIAGNOSIS — Z87891 Personal history of nicotine dependence: Secondary | ICD-10-CM | POA: Insufficient documentation

## 2022-12-19 DIAGNOSIS — Z853 Personal history of malignant neoplasm of breast: Secondary | ICD-10-CM | POA: Insufficient documentation

## 2022-12-19 DIAGNOSIS — Z171 Estrogen receptor negative status [ER-]: Secondary | ICD-10-CM | POA: Diagnosis not present

## 2022-12-19 DIAGNOSIS — Z9013 Acquired absence of bilateral breasts and nipples: Secondary | ICD-10-CM | POA: Insufficient documentation

## 2022-12-19 DIAGNOSIS — C50512 Malignant neoplasm of lower-outer quadrant of left female breast: Secondary | ICD-10-CM | POA: Diagnosis not present

## 2022-12-19 NOTE — Patient Instructions (Signed)
[facility] at [facility] Discharge Instructions   You were seen and examined today by Dr. Ellin Saba.  He reviewed the results of your lab work which was normal. Your tumor marker was normal.   We will see you back in one year. We will repeat lab work prior to this visit.   Return as scheduled.    Thank you for choosing [facility] at [facility] to provide your oncology and hematology care.  To afford each patient quality time with our provider, please arrive at least 15 minutes before your scheduled appointment time.   If you have a lab appointment with the Cancer Center please come in thru the Main Entrance and check in at the main information desk.  You need to re-schedule your appointment should you arrive 10 or more minutes late.  We strive to give you quality time with our providers, and arriving late affects you and other patients whose appointments are after yours.  Also, if you no show three or more times for appointments you may be dismissed from the clinic at the providers discretion.     Again, thank you for choosing [facility].  Our hope is that these requests will decrease the amount of time that you wait before being seen by our physicians.       _____________________________________________________________  Should you have questions after your visit to [facility], please contact our office at [phone] and follow the prompts.  Our office hours are 8:00 a.m. and 4:30 p.m. Monday - Friday.  Please note that voicemails left after 4:00 p.m. may not be returned until the following business day.  We are closed weekends and major holidays.  You do have access to a nurse 24-7, just call the main number to the clinic [phone] and do not press any options, hold on the line and a nurse will answer the phone.    For prescription refill requests, have your pharmacy contact our office and allow 72 hours.     Due to Covid, you will need to wear a mask upon entering the hospital. If you do not have a mask, a mask will be given to you at the Main Entrance upon arrival. For doctor visits, patients may have 1 support person age 14 or older with them. For treatment visits, patients can not have anyone with them due to social distancing guidelines and our immunocompromised population.

## 2023-01-11 ENCOUNTER — Ambulatory Visit: Payer: Self-pay | Admitting: Internal Medicine

## 2023-01-11 NOTE — Telephone Encounter (Signed)
 Pt is unable to do virtual appt, nothing available in office until 1/17 , pt will go to [facility]

## 2023-01-11 NOTE — Telephone Encounter (Signed)
 Copied from CRM [id number]. Topic: Clinical - Medical Advice >> Jan 11, 2023  9:10 AM Curlee DEL wrote: Reason for CRM: Patient has been experiencing chest congestion and a dry cough - patient would like to see if Dr. Cleo office can call something into her [facility] pharmacy on [address].    Chief Complaint: Cough and congestion Symptoms: Dry cough, chest congestion, head aches when coughing, runny nose, occasional SOB with activity Frequency: Ongoing since 12/26 Pertinent Negatives: Patient denies fever Disposition: [] ED /[] Urgent Care (no appt availability in office) / [x] Appointment(In office/virtual)/ []  [facility]/ [] Home Care/ [] Refused Recommended Disposition /[] [facility]/ []  Follow-up with PCP  Additional Notes: Patient reported having cold symptoms since  12/26. Her main symptoms are cough and chest congestion. She stated her symptoms started improving after 1 week, and then got worse again, (mostly the congestion). Patient has been using Mucinex without relief. No appointments were available in the office until February, and patient also voiced concerns about not feeling safe going out on the roads. I offered virtual visit for today, and patient stated she is not familiar with that at all and she does not feel comfortable with navigating and getting into mychart. Patient wanted to know if the provider can just send a prescription. Patient is aware that a message is being sent and the office will follow up with her.    Reason for Disposition  [1] Continuous (nonstop) coughing interferes with work or school AND [2] no improvement using cough treatment per Care Advice  Answer Assessment - Initial Assessment Questions 1. ONSET: When did the cough begin?      12/26 2. SEVERITY: How bad is the cough today?      A lot of coughing this morning  3. SPUTUM: Describe the color of your sputum (none, dry cough; clear, white, yellow, green)     None  4.  DIFFICULTY BREATHING: Are you having difficulty breathing? If Yes, ask: How bad is it? (e.g., mild, moderate, severe)    - MILD: No SOB at rest, mild SOB with walking, speaks normally in sentences, can lie down, no retractions, pulse < 100.    - MODERATE: SOB at rest, SOB with minimal exertion and prefers to sit, cannot lie down flat, speaks in phrases, mild retractions, audible wheezing, pulse 100-120.    - SEVERE: Very SOB at rest, speaks in single words, struggling to breathe, sitting hunched forward, retractions, pulse > 120      SOB with activity  6. FEVER: Do you have a fever? If Yes, ask: What is your temperature, how was it measured, and when did it start?     No fever   7. OTHER SYMPTOMS: Do you have any other symptoms? (e.g., runny nose, wheezing, chest pain)       Runny nose  Protocols used: Cough - Acute Productive-A-AH

## 2023-01-17 ENCOUNTER — Encounter: Payer: Self-pay | Admitting: Emergency Medicine

## 2023-01-17 ENCOUNTER — Ambulatory Visit
Admission: EM | Admit: 2023-01-17 | Discharge: 2023-01-17 | Disposition: A | Discharge status: Home / Self Care | Payer: Medicare Other | Attending: Nurse Practitioner | Admitting: Nurse Practitioner

## 2023-01-17 DIAGNOSIS — B9689 Other specified bacterial agents as the cause of diseases classified elsewhere: Secondary | ICD-10-CM

## 2023-01-17 DIAGNOSIS — J019 Acute sinusitis, unspecified: Secondary | ICD-10-CM

## 2023-01-17 MED ORDER — AMOXICILLIN-POT CLAVULANATE 875-125 MG PO TABS: 1.0000 | ORAL_TABLET | Freq: Two times a day (BID) | ORAL | 0 refills | Status: AC

## 2023-01-17 MED ORDER — BENZONATATE 100 MG PO CAPS: 100.0000 mg | ORAL_CAPSULE | Freq: Three times a day (TID) | ORAL | 0 refills | Status: DC | PRN
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
## 2023-01-17 NOTE — Discharge Instructions (Signed)
 You have a sinus infection.  Take the Augmentin  as prescribed to treat it.  Symptoms should improve over the next few days.  Seek care if symptoms worsen despite treatment.  Some things that can make you feel better are: - Increased rest - Increasing fluid with water /sugar free electrolytes - Acetaminophen  as needed for fever/pain - Salt water  gargling, chloraseptic spray and throat lozenges for sore throat - OTC guaifenesin (Mucinex) 600 mg twice daily for congestion - Saline sinus flushes or a neti pot - Humidifying the air -Tessalon  Perles every 8 hours as needed for dry cough

## 2023-01-17 NOTE — ED Triage Notes (Signed)
 Cough since the day after christmas.  States cough turned productive on 1/4.  Has been taking mucinex without relief.

## 2023-01-19 ENCOUNTER — Other Ambulatory Visit: Payer: Self-pay

## 2023-01-19 ENCOUNTER — Emergency Department (HOSPITAL_COMMUNITY)
Admission: EM | Admit: 2023-01-19 | Discharge: 2023-01-19 | Disposition: A | Discharge status: Home / Self Care | Payer: Medicare Other | Attending: Emergency Medicine | Admitting: Emergency Medicine

## 2023-01-19 ENCOUNTER — Emergency Department (HOSPITAL_COMMUNITY): Payer: Medicare Other

## 2023-01-19 ENCOUNTER — Encounter (HOSPITAL_COMMUNITY): Payer: Self-pay | Admitting: Emergency Medicine

## 2023-01-19 DIAGNOSIS — Z853 Personal history of malignant neoplasm of breast: Secondary | ICD-10-CM | POA: Diagnosis not present

## 2023-01-19 DIAGNOSIS — R531 Weakness: Secondary | ICD-10-CM | POA: Diagnosis not present

## 2023-01-19 DIAGNOSIS — R059 Cough, unspecified: Secondary | ICD-10-CM | POA: Insufficient documentation

## 2023-01-19 DIAGNOSIS — Z79899 Other long term (current) drug therapy: Secondary | ICD-10-CM | POA: Diagnosis not present

## 2023-01-19 DIAGNOSIS — R112 Nausea with vomiting, unspecified: Secondary | ICD-10-CM | POA: Diagnosis present

## 2023-01-19 DIAGNOSIS — Z20822 Contact with and (suspected) exposure to covid-19: Secondary | ICD-10-CM | POA: Insufficient documentation

## 2023-01-19 DIAGNOSIS — J3489 Other specified disorders of nose and nasal sinuses: Secondary | ICD-10-CM | POA: Diagnosis not present

## 2023-01-19 DIAGNOSIS — T50905A Adverse effect of unspecified drugs, medicaments and biological substances, initial encounter: Secondary | ICD-10-CM

## 2023-01-19 LAB — CBC WITH DIFFERENTIAL/PLATELET
Abs Immature Granulocytes: 0.03 10*3/uL (ref 0.00–0.07)
Basophils Absolute: 0 10*3/uL (ref 0.0–0.1)
Basophils Relative: 0 %
Eosinophils Absolute: 0.1 10*3/uL (ref 0.0–0.5)
Eosinophils Relative: 1 %
HCT: 40.1 % (ref 36.0–46.0)
Hemoglobin: 13.4 g/dL (ref 12.0–15.0)
Immature Granulocytes: 0 %
Lymphocytes Relative: 5 %
Lymphs Abs: 0.4 10*3/uL — ABNORMAL LOW (ref 0.7–4.0)
MCH: 29.6 pg (ref 26.0–34.0)
MCHC: 33.4 g/dL (ref 30.0–36.0)
MCV: 88.5 fL (ref 80.0–100.0)
Monocytes Absolute: 0.4 10*3/uL (ref 0.1–1.0)
Monocytes Relative: 5 %
Neutro Abs: 7 10*3/uL (ref 1.7–7.7)
Neutrophils Relative %: 89 %
Platelets: 215 10*3/uL (ref 150–400)
RBC: 4.53 MIL/uL (ref 3.87–5.11)
RDW: 12.7 % (ref 11.5–15.5)
WBC: 7.9 10*3/uL (ref 4.0–10.5)
nRBC: 0 % (ref 0.0–0.2)

## 2023-01-19 LAB — RESP PANEL BY RT-PCR (RSV, FLU A&B, COVID)  RVPGX2
Influenza A by PCR: NEGATIVE
Influenza B by PCR: NEGATIVE
Resp Syncytial Virus by PCR: NEGATIVE
SARS Coronavirus 2 by RT PCR: NEGATIVE

## 2023-01-19 LAB — COMPREHENSIVE METABOLIC PANEL
ALT: 14 U/L (ref 0–44)
AST: 15 U/L (ref 15–41)
Albumin: 3.3 g/dL — ABNORMAL LOW (ref 3.5–5.0)
Alkaline Phosphatase: 60 U/L (ref 38–126)
Anion gap: 8 (ref 5–15)
BUN: 17 mg/dL (ref 8–23)
CO2: 23 mmol/L (ref 22–32)
Calcium: 8.5 mg/dL — ABNORMAL LOW (ref 8.9–10.3)
Chloride: 105 mmol/L (ref 98–111)
Creatinine, Ser: 0.68 mg/dL (ref 0.44–1.00)
GFR, Estimated: >60 mL/min (ref >60)
Glucose, Bld: 133 mg/dL — ABNORMAL HIGH (ref 70–99)
Potassium: 3.4 mmol/L — ABNORMAL LOW (ref 3.5–5.1)
Sodium: 136 mmol/L (ref 135–145)
Total Bilirubin: 0.9 mg/dL (ref 0.0–1.2)
Total Protein: 6.2 g/dL — ABNORMAL LOW (ref 6.5–8.1)

## 2023-01-19 LAB — LIPASE, BLOOD: Lipase: 33 U/L (ref 11–51)

## 2023-01-19 MED ORDER — ONDANSETRON HCL 4 MG PO TABS: 4.0000 mg | ORAL_TABLET | Freq: Three times a day (TID) | ORAL | Status: DC | PRN

## 2023-01-19 MED ORDER — SODIUM CHLORIDE 0.9 % IV BOLUS
500.0000 mL | Freq: Once | INTRAVENOUS | Status: AC
  Administered 2023-01-19: 500 mL via INTRAVENOUS

## 2023-01-19 MED ORDER — ONDANSETRON HCL 4 MG PO TABS: 4.0000 mg | ORAL_TABLET | Freq: Three times a day (TID) | ORAL | 0 refills | Status: DC | PRN

## 2023-01-19 MED ORDER — ONDANSETRON 4 MG PO TBDP: 4.0000 mg | ORAL_TABLET | Freq: Three times a day (TID) | ORAL | 0 refills | Status: DC | PRN

## 2023-01-19 MED ORDER — HEPARIN SOD (PORK) LOCK FLUSH 100 UNIT/ML IV SOLN
500.0000 [IU] | Freq: Once | INTRAVENOUS | Status: AC
  Administered 2023-01-19: 500 [IU]
  Filled 2023-01-19: qty 5

## 2023-01-19 MED ORDER — ONDANSETRON HCL 4 MG/2ML IJ SOLN
4.0000 mg | Freq: Once | INTRAMUSCULAR | Status: AC
  Administered 2023-01-19: 4 mg via INTRAVENOUS
  Filled 2023-01-19: qty 2

## 2023-01-19 NOTE — ED Triage Notes (Signed)
Pt presents with cough, chills, sinus congestion for over 3 weeks, seen at [facility] 01/17/23 and diagnosed with sinus infection, given Augmentin that is causing nausea.

## 2023-01-19 NOTE — Discharge Instructions (Signed)
You were seen in the ER today with concerns of a sinus infection and reactions to the antibiotic. Your labs and imaging were reassuring, and I suspect the nausea and vomiting is due to the Augmentin. This is a known side effect as well as diarrhea. You appeared to have improvement in your nausea with Zofran here so I have prescribed this to your pharmacy. You should also ensure that you are eating prior to taking any antibiotic as well as adding a probiotic supplement as this can counteract this side effect. If symptoms are worsening, return to the ER. Otherwise, please follow up with your primary care provider.

## 2023-01-19 NOTE — ED Provider Notes (Signed)
[place] [facility] AT [facility] Provider Note   CSN: [id number] Arrival date & time: 01/19/23  1712     History  Chief Complaint  Patient presents with   Sinus Infection    Tiffany Sweeney is a 89 y.o. female.  She has.  Of hypertension and history of breast cancer in remission.  Presents the ER today complaining of sinus congestion, cough, nausea and vomiting and generalized weakness.  She states that she has had a cough and congestion for 3 weeks now.  Was put on Augmentin for sinus infection on July 14 and was told that by today she should be starting to feel somewhat better, but if she was not where she felt worse she should come to the ER or see her primary care doctor.  She states she is now having nausea and vomiting, feels very weak.  Patient notes she lives by herself though her daughter is here at bedside today.  She has not a fever, no diarrhea.  HPI     Home Medications Prior to Admission medications   Medication Sig Start Date End Date Taking? Authorizing Provider  amLODipine (NORVASC) 5 MG tablet Take 1 tablet (5 mg total) by mouth daily. 12/06/22   Billie Lade, MD  amoxicillin-clavulanate (AUGMENTIN) 875-125 MG tablet Take 1 tablet by mouth 2 (two) times daily for 7 days. 01/17/23 01/24/23  Valentino Nose, NP  benzonatate (TESSALON) 100 MG capsule Take 1 capsule (100 mg total) by mouth 3 (three) times daily as needed for cough. Do not take with alcohol or while operating or driving heavy machinery 0/86/57   Valentino Nose, NP  carvedilol (COREG) 6.25 MG tablet Take 1 tablet (6.25 mg total) by mouth 2 (two) times daily with a meal. 12/06/22 12/01/23  Billie Lade, MD  losartan (COZAAR) 100 MG tablet Take 1 tablet (100 mg total) by mouth daily. 12/06/22   Billie Lade, MD      Allergies    Statins    Review of Systems   Review of Systems  Physical Exam Updated Vital Signs BP (!) 143/93 (BP Location: Right Wrist)   Pulse  99   Temp 98.9 F (37.2 C) (Oral)   Resp 16   Ht 5\' 4"  (1.626 m)   Wt 74.8 kg   SpO2 95%   BMI 28.32 kg/m  Physical Exam Vitals and nursing note reviewed.  Constitutional:      General: She is not in acute distress.    Appearance: She is well-developed.  HENT:     Head: Normocephalic and atraumatic.  Eyes:     Conjunctiva/sclera: Conjunctivae normal.  Cardiovascular:     Rate and Rhythm: Normal rate and regular rhythm.     Heart sounds: No murmur heard. Pulmonary:     Effort: Pulmonary effort is normal. No respiratory distress.     Breath sounds: Normal breath sounds.  Abdominal:     Palpations: Abdomen is soft.     Tenderness: There is no abdominal tenderness.  Musculoskeletal:        General: No swelling.     Cervical back: Neck supple.  Skin:    General: Skin is warm and dry.     Capillary Refill: Capillary refill takes less than 2 seconds.  Neurological:     General: No focal deficit present.     Mental Status: She is alert and oriented to person, place, and time.  Psychiatric:        Mood  and Affect: Mood normal.     ED Results / Procedures / Treatments   Labs (all labs ordered are listed, but only abnormal results are displayed) Labs Reviewed  RESP PANEL BY RT-PCR (RSV, FLU A&B, COVID)  [id number]  CBC WITH DIFFERENTIAL/PLATELET  COMPREHENSIVE METABOLIC PANEL  LIPASE, BLOOD  URINALYSIS, ROUTINE W REFLEX MICROSCOPIC    EKG None  Radiology No results found.  Procedures Procedures    Medications Ordered in ED Medications - No data to display  ED Course/ Medical Decision Making/ A&P                                 Medical Decision Making This patient presents to the ED for concern of ongoing cough and congestion with nausea vomiting and generalized weakness, this involves an extensive number of treatment options, and is a complaint that carries with it a high risk of complications and morbidity.  The differential diagnosis includes sinusitis,  pneumonia, sepsis, viral infection other   Co morbidities that complicate the patient evaluation  Hypertension   Additional history obtained:  Additional history obtained from EMR External records from outside source obtained and reviewed including recent urgent care notes, prior labs  ED course:  Patient being treated for bacterial sinusitis but is having worsening of symptoms and is now having nausea vomiting and is very weak, she is having trouble even sitting up in the bed independently.  Vitals are reassuring, she is not in any distress but will check labs, give Zofran, obtain chest x-ray.  Respiratory symptoms have been ongoing for 3 weeks  Amount and/or Complexity of Data Reviewed Labs: ordered. Radiology: ordered.  Risk Prescription drug management.   Signed out to oncoming team        Final Clinical Impression(s) / ED Diagnoses Final diagnoses:  None    Rx / DC Orders ED Discharge Orders     None         Josem Kaufmann 01/19/23 1918    Bethann Berkshire, MD 01/21/23 1118

## 2023-01-20 MED FILL — Ondansetron HCl Tab 4 MG: ORAL | Qty: 4 | Status: AC

## 2023-02-10 ENCOUNTER — Ambulatory Visit
Admission: EM | Admit: 2023-02-10 | Discharge: 2023-02-10 | Disposition: A | Discharge status: Home / Self Care | Payer: Medicare Other | Attending: Family Medicine | Admitting: Family Medicine

## 2023-02-10 DIAGNOSIS — N390 Urinary tract infection, site not specified: Secondary | ICD-10-CM | POA: Diagnosis present

## 2023-02-10 LAB — POCT URINALYSIS DIP (MANUAL ENTRY)
Bilirubin, UA: NEGATIVE
Glucose, UA: NEGATIVE mg/dL
Ketones, POC UA: NEGATIVE mg/dL
Nitrite, UA: POSITIVE — AB
Protein Ur, POC: >=300 mg/dL — AB
Spec Grav, UA: 1.02 (ref 1.010–1.025)
Urobilinogen, UA: 0.2 U/dL
pH, UA: 6.5 (ref 5.0–8.0)

## 2023-02-10 MED ORDER — CEPHALEXIN 500 MG PO CAPS
500.0000 mg | ORAL_CAPSULE | Freq: Two times a day (BID) | ORAL | 0 refills | Status: DC
Start: 2023-02-10 — End: 2023-04-18

## 2023-02-10 MED ORDER — PHENAZOPYRIDINE HCL 100 MG PO TABS: 100.0000 mg | ORAL_TABLET | Freq: Three times a day (TID) | ORAL | 0 refills | Status: DC | PRN

## 2023-02-10 NOTE — ED Provider Notes (Signed)
 [facility]    CSN: [id number] Arrival date & time: 02/10/23  1153      History   Chief Complaint Chief Complaint  Patient presents with   Urinary Frequency    HPI Tiffany Sweeney is a 89 y.o. female.   Patient presenting today with 1 day history of urinary frequency, urgency and hesitancy with voiding.  Denies fever, chills, flank pain, nausea, vomiting, hematuria.  History of urinary tract infections.  So far not tried anything over-the-counter for symptoms.    Past Medical History:  Diagnosis Date   Anxiety    Arthritis    Breast cancer (HCC)    Cardiomyopathy (HCC)    Coronary atherosclerosis    Mild, nonobstructive - cardiac catheterization October 2022   Essential hypertension    Family history of colon cancer    Family history of ovarian cancer    Family history of pancreatic cancer    Heart attack [facility])    Hyperlipidemia    Skin cancer    Patient stated  she has low grade skin cancer in right hand    Patient Active Problem List   Diagnosis Date Noted   Essential hypertension 12/08/2022   CAD (coronary artery disease) 12/08/2022   HFrEF (heart failure with reduced ejection fraction) (HCC) 12/08/2022   S/P hysterectomy with oophorectomy 06/17/2022   Cystocele, midline 06/17/2022   Rectocele 06/17/2022   Constipation 06/17/2022   NSTEMI (non-ST elevated myocardial infarction) (HCC) 10/21/2020   Health care maintenance 11/02/2015   Blood glucose elevated 11/02/2015   OA (osteoarthritis) of hip 08/05/2015   Genetic testing 04/29/2014   Family history of ovarian cancer    Family history of colon cancer    Family history of pancreatic cancer    Breast cancer (HCC) 11/04/2013   Low back pain 11/12/2012    Past Surgical History:  Procedure Laterality Date   ABDOMINAL HYSTERECTOMY     CATARACT EXTRACTION W/PHACO Left 03/11/2013   Procedure: CATARACT EXTRACTION PHACO AND INTRAOCULAR LENS PLACEMENT (IOC);  Surgeon: Cherene Mania, MD;  Location: [facility];  Service: Ophthalmology;  Laterality: Left;  CDE:7.54   CATARACT EXTRACTION W/PHACO Right 04/15/2013   Procedure: CATARACT EXTRACTION PHACO AND INTRAOCULAR LENS PLACEMENT (IOC);  Surgeon: Cherene Mania, MD;  Location: [facility];  Service: Ophthalmology;  Laterality: Right;  CDE 9.38   LEFT HEART CATH AND CORONARY ANGIOGRAPHY N/A 10/21/2020   Procedure: LEFT HEART CATH AND CORONARY ANGIOGRAPHY;  Surgeon: Wendel Lurena POUR, MD;  Location: [facility];  Service: Cardiovascular;  Laterality: N/A;   MASTECTOMY Right    MASTECTOMY MODIFIED RADICAL Left 11/04/2013   Procedure: LEFT MASTECTOMY MODIFIED RADICAL;  Surgeon: Oneil DELENA Budge, MD;  Location: [facility];  Service: General;  Laterality: Left;   PORTACATH PLACEMENT Right 12/06/2013   Procedure: INSERTION PORT-A-CATH-Right subclavian;  Surgeon: Oneil DELENA Budge, MD;  Location: [facility];  Service: General;  Laterality: Right;   TOTAL HIP ARTHROPLASTY Right 08/05/2015   Procedure: RIGHT TOTAL HIP ARTHROPLASTY ANTERIOR APPROACH;  Surgeon: Dempsey Moan, MD;  Location: [facility];  Service: Orthopedics;  Laterality: Right;    OB History     Gravida  4   Para  3   Term  3   Preterm      AB  1   Living  2      SAB  1   IAB      Ectopic      Multiple      Live Births  Home Medications    Prior to Admission medications   Medication Sig Start Date End Date Taking? Authorizing Provider  cephALEXin  (KEFLEX ) 500 MG capsule Take 1 capsule (500 mg total) by mouth 2 (two) times daily. 02/10/23  Yes Stuart Vernell Norris, PA-C  phenazopyridine  (PYRIDIUM ) 100 MG tablet Take 1 tablet (100 mg total) by mouth 3 (three) times daily as needed for pain. 02/10/23  Yes Stuart Vernell Norris, PA-C  amLODipine  (NORVASC ) 5 MG tablet Take 1 tablet (5 mg total) by mouth daily. 12/06/22   Melvenia Manus BRAVO, MD  benzonatate  (TESSALON ) 100 MG capsule Take 1 capsule (100 mg total) by mouth 3 (three) times daily as needed for cough. Do not take with  alcohol or while operating or driving heavy machinery 8/85/74   Chandra Harlene LABOR, NP  carvedilol  (COREG ) 6.25 MG tablet Take 1 tablet (6.25 mg total) by mouth 2 (two) times daily with a meal. 12/06/22 12/01/23  Melvenia Manus BRAVO, MD  losartan  (COZAAR ) 100 MG tablet Take 1 tablet (100 mg total) by mouth daily. 12/06/22   Melvenia Manus BRAVO, MD  ondansetron  (ZOFRAN ) 4 MG tablet Take 1 tablet (4 mg total) by mouth every 8 (eight) hours as needed for nausea or vomiting. 01/19/23   Zelaya, Oscar A, PA-C  ondansetron  (ZOFRAN -ODT) 4 MG disintegrating tablet Take 1 tablet (4 mg total) by mouth every 8 (eight) hours as needed for nausea or vomiting. 01/19/23   Zelaya, Oscar A, PA-C    Family History Family History  Problem Relation Age of Onset   Dementia Father        Died at 7 in an accident   Diabetes Mother    Rectal cancer Mother 49       died from complications of DM   Pancreatic cancer Brother 13   Lung cancer Sister 1       small oat cell   Ovarian cancer Sister 73   Pancreatic cancer Sister    Cancer Cousin        maternal 1st cousin with either ovarian or uterine   Colon cancer Cousin        paternal first cousin dx at unknown age   Alcohol abuse Daughter     Social History Social History   Tobacco Use   Smoking status: Former    Current packs/day: 0.00    Average packs/day: 0.3 packs/day for 30.0 years (7.5 ttl pk-yrs)    Types: Cigarettes    Start date: 10/10/1956    Quit date: 03/08/1985    Years since quitting: 37.9   Smokeless tobacco: Never   Tobacco comments:    quit 25 years ago  Vaping Use   Vaping status: Never Used  Substance Use Topics   Alcohol use: Yes    Comment: very rarely   Drug use: No     Allergies   Statins   Review of Systems Review of Systems Per HPI  Physical Exam Triage Vital Signs ED Triage Vitals  Encounter Vitals Group     BP 02/10/23 1240 (!) 142/90     Systolic BP Percentile --      Diastolic BP Percentile --      Pulse Rate  02/10/23 1240 69     Resp 02/10/23 1240 18     Temp 02/10/23 1240 98.1 F (36.7 C)     Temp Source 02/10/23 1240 Oral     SpO2 02/10/23 1240 94 %     Weight --  Height --      Head Circumference --      Peak Flow --      Pain Score 02/10/23 1237 0     Pain Loc --      Pain Education --      Exclude from Growth Chart --    No data found.  Updated Vital Signs BP (!) 142/90 (BP Location: Right Arm)   Pulse 69   Temp 98.1 F (36.7 C) (Oral)   Resp 18   SpO2 94%   Visual Acuity Right Eye Distance:   Left Eye Distance:   Bilateral Distance:    Right Eye Near:   Left Eye Near:    Bilateral Near:     Physical Exam Vitals and nursing note reviewed.  Constitutional:      Appearance: Normal appearance. She is not ill-appearing.  HENT:     Head: Atraumatic.  Eyes:     Extraocular Movements: Extraocular movements intact.     Conjunctiva/sclera: Conjunctivae normal.  Cardiovascular:     Rate and Rhythm: Normal rate and regular rhythm.     Heart sounds: Normal heart sounds.  Pulmonary:     Effort: Pulmonary effort is normal.     Breath sounds: Normal breath sounds.  Abdominal:     General: Bowel sounds are normal. There is no distension.     Palpations: Abdomen is soft.     Tenderness: There is no abdominal tenderness. There is no right CVA tenderness, left CVA tenderness or guarding.  Musculoskeletal:        General: Normal range of motion.     Cervical back: Normal range of motion and neck supple.  Skin:    General: Skin is warm and dry.  Neurological:     Mental Status: She is alert and oriented to person, place, and time.  Psychiatric:        Mood and Affect: Mood normal.        Thought Content: Thought content normal.        Judgment: Judgment normal.      UC Treatments / Results  Labs (all labs ordered are listed, but only abnormal results are displayed) Labs Reviewed  POCT URINALYSIS DIP (MANUAL ENTRY) - Abnormal; Notable for the following  components:      Result Value   Clarity, UA cloudy (*)    Blood, UA large (*)    Protein Ur, POC >=300 (*)    Nitrite, UA Positive (*)    Leukocytes, UA Small (1+) (*)    All other components within normal limits  URINE CULTURE    EKG   Radiology No results found.  Procedures Procedures (including critical care time)  Medications Ordered in UC Medications - No data to display  Initial Impression / Assessment and Plan / UC Course  I have reviewed the triage vital signs and the nursing notes.  Pertinent labs & imaging results that were available during my care of the patient were reviewed by me and considered in my medical decision making (see chart for details).     Vitals and exam reassuring today, urinalysis with evidence of urinary tract infection.  Urine culture pending, treat with Pyridium , Keflex , supportive over-the-counter medications and home care.  Final Clinical Impressions(s) / UC Diagnoses   Final diagnoses:  Acute lower UTI     Discharge Instructions      We have sent out for urine culture that will let us  know if you are on the right medication  for your specific infection.  If this tells us  we need to make any changes we will give you a call and send in a different medication.  I have sent an antibiotic as well as a medication for burning with urination.  Drink plenty of fluids and follow-up for significantly worsening symptoms    ED Prescriptions     Medication Sig Dispense Auth. Provider   cephALEXin  (KEFLEX ) 500 MG capsule Take 1 capsule (500 mg total) by mouth 2 (two) times daily. 10 capsule Stuart Vernell Norris, PA-C   phenazopyridine  (PYRIDIUM ) 100 MG tablet Take 1 tablet (100 mg total) by mouth 3 (three) times daily as needed for pain. 10 tablet Stuart Vernell Norris, [place]      PDMP not reviewed this encounter.   Stuart Vernell Norris, [place] 02/10/23 1459

## 2023-02-10 NOTE — ED Notes (Signed)
 Pt currently has a shy bladder. States she cannot produce anymore urine. Have her drinking water .

## 2023-02-10 NOTE — ED Triage Notes (Signed)
 Pt reports she has some frequent urination and only voiding small amounts x 1 day

## 2023-02-10 NOTE — Discharge Instructions (Signed)
 We have sent out for urine culture that will let us  know if you are on the right medication for your specific infection.  If this tells us  we need to make any changes we will give you a call and send in a different medication.  I have sent an antibiotic as well as a medication for burning with urination.  Drink plenty of fluids and follow-up for significantly worsening symptoms

## 2023-02-12 LAB — URINE CULTURE: Culture: >=100000 — AB

## 2023-03-07 ENCOUNTER — Ambulatory Visit: Payer: TRICARE For Life (TFL)

## 2023-03-20 ENCOUNTER — Inpatient Hospital Stay: Payer: Medicare Other | Attending: Hematology

## 2023-03-20 DIAGNOSIS — Z853 Personal history of malignant neoplasm of breast: Secondary | ICD-10-CM | POA: Diagnosis present

## 2023-03-20 DIAGNOSIS — Z452 Encounter for adjustment and management of vascular access device: Secondary | ICD-10-CM | POA: Diagnosis present

## 2023-03-20 MED ORDER — HEPARIN SOD (PORK) LOCK FLUSH 100 UNIT/ML IV SOLN
500.0000 [IU] | Freq: Once | INTRAVENOUS | Status: AC
  Administered 2023-03-20: 500 [IU] via INTRAVENOUS

## 2023-03-20 MED ORDER — SODIUM CHLORIDE 0.9% FLUSH
10.0000 mL | INTRAVENOUS | Status: DC | PRN
Start: 2023-03-20 — End: 2023-03-20
  Administered 2023-03-20: 10 mL via INTRAVENOUS

## 2023-03-20 NOTE — Patient Instructions (Signed)
 [facility] [place] - A DEPT OF [facility]  Discharge Instructions: Thank you for choosing [facility] to provide your oncology and hematology care.  If you have a lab appointment with the Cancer Center - please note that after April 8th, 2024, all labs will be drawn in the cancer center.  You do not have to check in or register with the main entrance as you have in the past but will complete your check-in in the cancer center.  Wear comfortable clothing and clothing appropriate for easy access to any Portacath or PICC line.   We strive to give you quality time with your provider. You may need to reschedule your appointment if you arrive late (15 or more minutes).  Arriving late affects you and other patients whose appointments are after yours.  Also, if you miss three or more appointments without notifying the office, you may be dismissed from the clinic at the provider's discretion.      For prescription refill requests, have your pharmacy contact our office and allow 72 hours for refills to be completed.    Today you received port flush.  BELOW ARE SYMPTOMS THAT SHOULD BE REPORTED IMMEDIATELY: *FEVER GREATER THAN 100.4 F (38 C) OR HIGHER *CHILLS OR SWEATING *NAUSEA AND VOMITING THAT IS NOT CONTROLLED WITH YOUR NAUSEA MEDICATION *UNUSUAL SHORTNESS OF BREATH *UNUSUAL BRUISING OR BLEEDING *URINARY PROBLEMS (pain or burning when urinating, or frequent urination) *BOWEL PROBLEMS (unusual diarrhea, constipation, pain near the anus) TENDERNESS IN MOUTH AND THROAT WITH OR WITHOUT PRESENCE OF ULCERS (sore throat, sores in mouth, or a toothache) UNUSUAL RASH, SWELLING OR PAIN  UNUSUAL VAGINAL DISCHARGE OR ITCHING   Items with * indicate a potential emergency and should be followed up as soon as possible or go to the Emergency Department if any problems should occur.  Please show the CHEMOTHERAPY ALERT CARD or IMMUNOTHERAPY ALERT CARD at check-in to the  Emergency Department and triage nurse.  Should you have questions after your visit or need to cancel or reschedule your appointment, please contact [facility] [place] - A DEPT OF [facility] [phone]  and follow the prompts.  Office hours are 8:00 a.m. to 4:30 p.m. Monday - Friday. Please note that voicemails left after 4:00 p.m. may not be returned until the following business day.  We are closed weekends and major holidays. You have access to a nurse at all times for urgent questions. Please call the main number to the clinic [phone] and follow the prompts.  For any non-urgent questions, you may also contact your provider using MyChart. We now offer e-Visits for anyone 17 and older to request care online for non-urgent symptoms. For details visit [email or web address].   Also download the MyChart app! Go to the app store, search "MyChart", open the app, select [place], and log in with your MyChart username and password.

## 2023-03-20 NOTE — Progress Notes (Signed)
 Orma Flaming presented for Portacath access and flush.  Portacath located right chest wall accessed with  H 20 needle.  Good blood return present. Portacath flushed with 20ml NS and 500U/93ml Heparin and needle removed intact. No bruising or swelling noted at the site  Procedure tolerated well and without incident.    Discharged from clinic ambulatory in stable condition. Alert and oriented x 3. F/U with [facility] as scheduled.

## 2023-04-04 ENCOUNTER — Ambulatory Visit: Payer: TRICARE For Life (TFL) | Admitting: Nurse Practitioner

## 2023-04-18 ENCOUNTER — Encounter: Payer: Self-pay | Admitting: Cardiology

## 2023-04-18 ENCOUNTER — Ambulatory Visit: Payer: TRICARE For Life (TFL) | Attending: Cardiology | Admitting: Cardiology

## 2023-04-18 VITALS — BP 132/84 | HR 68 | Ht 64.0 in | Wt 160.8 lb

## 2023-04-18 DIAGNOSIS — R0602 Shortness of breath: Secondary | ICD-10-CM | POA: Diagnosis not present

## 2023-04-18 DIAGNOSIS — Z8679 Personal history of other diseases of the circulatory system: Secondary | ICD-10-CM | POA: Diagnosis not present

## 2023-04-18 DIAGNOSIS — I5032 Chronic diastolic (congestive) heart failure: Secondary | ICD-10-CM | POA: Diagnosis not present

## 2023-04-18 DIAGNOSIS — I10 Essential (primary) hypertension: Secondary | ICD-10-CM

## 2023-04-18 DIAGNOSIS — I5020 Unspecified systolic (congestive) heart failure: Secondary | ICD-10-CM

## 2023-04-18 MED ORDER — CARVEDILOL 6.25 MG PO TABS: 6.2500 mg | ORAL_TABLET | Freq: Two times a day (BID) | ORAL | 3 refills | Status: DC

## 2023-04-18 MED ORDER — AMLODIPINE BESYLATE 5 MG PO TABS: 5.0000 mg | ORAL_TABLET | Freq: Every day | ORAL | 3 refills | Status: AC

## 2023-04-18 MED ORDER — LOSARTAN POTASSIUM 100 MG PO TABS: 100.0000 mg | ORAL_TABLET | Freq: Every day | ORAL | 3 refills | Status: AC

## 2023-04-18 NOTE — Progress Notes (Signed)
    Cardiology Office Note  Date: 04/18/2023   ID: Tiffany Sweeney, DOB [birth year], MRN [id number]  History of Present Illness: Tiffany Sweeney is an 89 y.o. female last seen in August 2023.  She is here for a follow-up visit.  She had originally tried to schedule a visit earlier this year when she was short of breath, ultimately found out that she had a sinus infection with subsequent improvement in symptoms.  Generally describes NYHA class II dyspnea, although she is more short of breath when walking up the incline of her driveway.  Having said that she has not noticed any additional fluid retention, does have lymphedema for which she uses lower extremity compression stockings.  She is not on diuretic therapy.  Her last echocardiogram was in 2023.  We went over her medications, checked on refills.  She reports compliance with therapy.  She continues to see Dr. Kermit Ped for primary care.  I reviewed her ECG today which shows normal sinus rhythm.  Physical Exam: VS:  BP 132/84   Pulse 68   Ht 5\' 4"  (1.626 m)   Wt 160 lb 12.8 oz (72.9 kg)   SpO2 95%   BMI 27.60 kg/m , BMI Body mass index is 27.6 kg/m.  Wt Readings from Last 3 Encounters:  04/18/23 160 lb 12.8 oz (72.9 kg)  01/19/23 165 lb (74.8 kg)  12/19/22 163 lb (73.9 kg)    General: Patient appears comfortable at rest. HEENT: Conjunctiva and lids normal. Neck: Supple, no elevated JVP or carotid bruits. Lungs: Clear to auscultation, nonlabored breathing at rest. Cardiac: Regular rate and rhythm, no S3 or significant systolic murmur. Extremities: Lymphedema present with bilateral compression stockings in place.  ECG:  An ECG dated 05/20/2022 was personally reviewed today and demonstrated:  Sinus rhythm with prolonged PR interval and LVH.  Labwork: 01/19/2023: ALT 14; AST 15; BUN 17; Creatinine, Ser 0.68; Hemoglobin 13.4; Platelets 215; Potassium 3.4; Sodium 136     Component Value Date/Time   CHOL 209 (H) 11/04/2021 1211    TRIG 152 (H) 11/04/2021 1211   HDL 43 11/04/2021 1211   CHOLHDL 4.9 11/04/2021 1211   VLDL 30 11/04/2021 1211   LDLCALC 136 (H) 11/04/2021 1211   Other Studies Reviewed Today:  No interval cardiac testing for review today.  Assessment and Plan:  1.  HFrecEF with history of possible stress-induced cardiomyopathy versus myocarditis diagnosed in October 2022.  Follow-up echocardiogram in February 2023 revealed LVEF 55 to 60%.  She has been clinically stable on medical therapy although with more prominent dyspnea exertion walking an incline.  No obvious additional fluid retention or substantial weight change.  Plan to update echocardiogram to ensure no change in LVEF to necessitate modification of her medications.  Currently on Coreg 6.25 mg twice daily and Cozaar 100 mg daily.   2.  Primary hypertension.  Also on Norvasc 5 mg daily.  No changes made to present regimen.  3.  Mild CAD at cardiac catheterization in October 2022.  No angina.  4.  Lymphedema, she uses lower extremity compression stockings.  Disposition:  Follow up  test results.  Signed, Tiffany Sweeney, M.D., F.A.C.C. [facility]

## 2023-04-18 NOTE — Patient Instructions (Signed)
 Medication Instructions:  Your physician recommends that you continue on your current medications as directed. Please refer to the Current Medication list given to you today.   Labwork: None today  Testing/Procedures: Your physician has requested that you have an echocardiogram. Echocardiography is a painless test that uses sound waves to create images of your heart. It provides your doctor with information about the size and shape of your heart and how well your heart's chambers and valves are working. This procedure takes approximately one hour. There are no restrictions for this procedure. Please do NOT wear cologne, perfume, aftershave, or lotions (deodorant is allowed). Please arrive 15 minutes prior to your appointment time.  Please note: We ask at that you not bring children with you during ultrasound (echo/ vascular) testing. Due to room size and safety concerns, children are not allowed in the ultrasound rooms during exams. Our front office staff cannot provide observation of children in our lobby area while testing is being conducted. An adult accompanying a patient to their appointment will only be allowed in the ultrasound room at the discretion of the ultrasound technician under special circumstances. We apologize for any inconvenience.   Follow-Up: To be determined  Any Other Special Instructions Will Be Listed Below (If Applicable).  If you need a refill on your cardiac medications before your next appointment, please call your pharmacy.

## 2023-04-28 ENCOUNTER — Ambulatory Visit (HOSPITAL_COMMUNITY)

## 2023-04-28 ENCOUNTER — Other Ambulatory Visit (HOSPITAL_COMMUNITY)

## 2023-05-09 ENCOUNTER — Ambulatory Visit (HOSPITAL_COMMUNITY)
Admission: RE | Admit: 2023-05-09 | Discharge: 2023-05-09 | Disposition: A | Discharge status: Home / Self Care | Source: Ambulatory Visit | Attending: Cardiology | Admitting: Cardiology

## 2023-05-09 ENCOUNTER — Ambulatory Visit
Admission: EM | Admit: 2023-05-09 | Discharge: 2023-05-09 | Disposition: A | Discharge status: Home / Self Care | Attending: Nurse Practitioner | Admitting: Nurse Practitioner

## 2023-05-09 DIAGNOSIS — R0602 Shortness of breath: Secondary | ICD-10-CM | POA: Diagnosis present

## 2023-05-09 DIAGNOSIS — Z8679 Personal history of other diseases of the circulatory system: Secondary | ICD-10-CM | POA: Diagnosis present

## 2023-05-09 DIAGNOSIS — R21 Rash and other nonspecific skin eruption: Secondary | ICD-10-CM

## 2023-05-09 LAB — ECHOCARDIOGRAM COMPLETE
AR max vel: 2.56 cm2
AV Area VTI: 3.3 cm2
AV Area mean vel: 2.58 cm2
AV Mean grad: 2 mmHg
AV Peak grad: 4.2 mmHg
Ao pk vel: 1.02 m/s
Area-P 1/2: 2.33 cm2
Calc EF: 61.1 %
MV VTI: 2.47 cm2
S' Lateral: 2.9 cm
Single Plane A2C EF: 65 %
Single Plane A4C EF: 57.5 %

## 2023-05-09 MED ORDER — TRIAMCINOLONE ACETONIDE 0.1 % EX CREA: 1.0000 | TOPICAL_CREAM | Freq: Two times a day (BID) | CUTANEOUS | 0 refills | Status: AC

## 2023-05-09 NOTE — ED Triage Notes (Signed)
 Pt reports she has 2 small red spots on her right thigh x 1 month   Pt states they are getting bigger.

## 2023-05-09 NOTE — ED Provider Notes (Signed)
 [facility]    CSN: [id number] Arrival date & time: 05/09/23  9147      History   Chief Complaint Chief Complaint  Patient presents with   Rash    HPI Tiffany Sweeney is a 89 y.o. female.   The history is provided by the patient.   Patient presents for complaints of 2 red spots on her right thigh membrane present for the past month.  Patient states over the past week, the areas have become more enlarged.  She denies pain, itching, or swelling on her thigh.  She also denies recent insect bites.  Patient states that she has not used any medication for her symptoms.  Patient states that she came in because her family was concerned.  Per review of her chart, patient with history of skin cancer.  Past Medical History:  Diagnosis Date   Anxiety    Arthritis    Breast cancer (HCC)    Cardiomyopathy (HCC)    Coronary atherosclerosis    Mild, nonobstructive - cardiac catheterization October 2022   Essential hypertension    Family history of colon cancer    Family history of ovarian cancer    Family history of pancreatic cancer    Heart attack [facility])    Hyperlipidemia    Skin cancer    Patient stated  she has low grade skin cancer in right hand    Patient Active Problem List   Diagnosis Date Noted   Essential hypertension 12/08/2022   CAD (coronary artery disease) 12/08/2022   HFrEF (heart failure with reduced ejection fraction) (HCC) 12/08/2022   S/P hysterectomy with oophorectomy 06/17/2022   Cystocele, midline 06/17/2022   Rectocele 06/17/2022   Constipation 06/17/2022   NSTEMI (non-ST elevated myocardial infarction) (HCC) 10/21/2020   Health care maintenance 11/02/2015   Blood glucose elevated 11/02/2015   OA (osteoarthritis) of hip 08/05/2015   Genetic testing 04/29/2014   Family history of ovarian cancer    Family history of colon cancer    Family history of pancreatic cancer    Breast cancer (HCC) 11/04/2013   Low back pain 11/12/2012    Past  Surgical History:  Procedure Laterality Date   ABDOMINAL HYSTERECTOMY     CATARACT EXTRACTION W/PHACO Left 03/11/2013   Procedure: CATARACT EXTRACTION PHACO AND INTRAOCULAR LENS PLACEMENT (IOC);  Surgeon: Anner Kill, MD;  Location: [facility];  Service: Ophthalmology;  Laterality: Left;  CDE:7.54   CATARACT EXTRACTION W/PHACO Right 04/15/2013   Procedure: CATARACT EXTRACTION PHACO AND INTRAOCULAR LENS PLACEMENT (IOC);  Surgeon: Anner Kill, MD;  Location: [facility];  Service: Ophthalmology;  Laterality: Right;  CDE 9.38   LEFT HEART CATH AND CORONARY ANGIOGRAPHY N/A 10/21/2020   Procedure: LEFT HEART CATH AND CORONARY ANGIOGRAPHY;  Surgeon: Kyra Phy, MD;  [facility];  Service: Cardiovascular;  Laterality: N/A;   MASTECTOMY Right    MASTECTOMY MODIFIED RADICAL Left 11/04/2013   Procedure: LEFT MASTECTOMY MODIFIED RADICAL;  Surgeon: Beau Bound, MD;  Location: [facility];  Service: General;  Laterality: Left;   PORTACATH PLACEMENT Right 12/06/2013   Procedure: INSERTION PORT-A-CATH-Right subclavian;  Surgeon: Beau Bound, MD;  Location: [facility];  Service: General;  Laterality: Right;   TOTAL HIP ARTHROPLASTY Right 08/05/2015   Procedure: RIGHT TOTAL HIP ARTHROPLASTY ANTERIOR APPROACH;  Surgeon: Liliane Rei, MD;  Location: [facility];  Service: Orthopedics;  Laterality: Right;    OB History     Gravida  4   Para  3  Term  3   Preterm      AB  1   Living  2      SAB  1   IAB      Ectopic      Multiple      Live Births               Home Medications    Prior to Admission medications   Medication Sig Start Date End Date Taking? Authorizing Provider  amLODipine  (NORVASC ) 5 MG tablet Take 1 tablet (5 mg total) by mouth daily. 04/18/23   Gerard Knight, MD  carvedilol  (COREG ) 6.25 MG tablet Take 1 tablet (6.25 mg total) by mouth 2 (two) times daily with a meal. 04/18/23 04/12/24  Gerard Knight, MD  losartan  (COZAAR ) 100 MG tablet Take 1 tablet (100 mg  total) by mouth daily. 04/18/23   Gerard Knight, MD    Family History Family History  Problem Relation Age of Onset   Dementia Father        Died at 58 in an accident   Diabetes Mother    Rectal cancer Mother 82       died from complications of DM   Pancreatic cancer Brother 34   Lung cancer Sister 54       "small oat cell"   Ovarian cancer Sister 47   Pancreatic cancer Sister    Cancer Cousin        maternal 1st cousin with either ovarian or uterine   Colon cancer Cousin        paternal first cousin dx at unknown age   Alcohol abuse Daughter     Social History Social History   Tobacco Use   Smoking status: Former    Current packs/day: 0.00    Average packs/day: 0.3 packs/day for 30.0 years (7.5 ttl pk-yrs)    Types: Cigarettes    Start date: 10/10/1956    Quit date: 03/08/1985    Years since quitting: 38.1   Smokeless tobacco: Never   Tobacco comments:    quit 25 years ago  Vaping Use   Vaping status: Never Used  Substance Use Topics   Alcohol use: Yes    Comment: very rarely   Drug use: No     Allergies   Statins   Review of Systems Review of Systems Per HPI  Physical Exam Triage Vital Signs ED Triage Vitals [05/09/23 0931]  Encounter Vitals Group     BP (!) 146/82     Systolic BP Percentile      Diastolic BP Percentile      Pulse Rate 70     Resp 16     Temp 97.9 F (36.6 C)     Temp Source Oral     SpO2 97 %     Weight      Height      Head Circumference      Peak Flow      Pain Score 0     Pain Loc      Pain Education      Exclude from Growth Chart    No data found.  Updated Vital Signs BP (!) 146/82 (BP Location: Right Wrist)   Pulse 70   Temp 97.9 F (36.6 C) (Oral)   Resp 16   SpO2 97%   Visual Acuity Right Eye Distance:   Left Eye Distance:   Bilateral Distance:    Right Eye Near:   Left Eye Near:  Bilateral Near:     Physical Exam Vitals and nursing note reviewed.  Constitutional:      General: She is not  in acute distress.    Appearance: Normal appearance.  HENT:     Head: Normocephalic.  Eyes:     Extraocular Movements: Extraocular movements intact.     Pupils: Pupils are equal, round, and reactive to light.  Pulmonary:     Effort: Pulmonary effort is normal.  Musculoskeletal:     Cervical back: Normal range of motion.  Skin:    General: Skin is warm and dry.     Findings: Rash present.     Comments: 2 erythematous areas noted to the upper anterior right thigh.  Areas are close in proximity.  The areas are smooth, borders are well-defined.  There is no oozing, fluctuance, or drainage present.  Neurological:     General: No focal deficit present.     Mental Status: She is alert and oriented to person, place, and time.  Psychiatric:        Mood and Affect: Mood normal.        Behavior: Behavior normal.      UC Treatments / Results  Labs (all labs ordered are listed, but only abnormal results are displayed) Labs Reviewed - No data to display  EKG   Radiology No results found.  Procedures Procedures (including critical care time)  Medications Ordered in UC Medications - No data to display  Initial Impression / Assessment and Plan / UC Course  I have reviewed the triage vital signs and the nursing notes.  Pertinent labs & imaging results that were available during my care of the patient were reviewed by me and considered in my medical decision making (see chart for details).  Difficult to determine the cause of the patient's current symptoms.  She does have 2 areas of erythema on the anterior right thigh.  The areas are not raised, there is no blistering or pustules present.  Will treat with triamcinolone  cream 0.1% for possible inflammation.  Supportive care recommendations were provided and discussed with the patient to include keeping the areas clean and dry, avoiding scratching or manipulating the areas, and applying cool compresses as needed.  Patient was advised to  follow-up with her PCP if symptoms fail to improve.  Patient was in agreement with this plan of care and verbalizes understanding.  All questions were answered.  Patient stable for discharge.   Final Clinical Impressions(s) / UC Diagnoses   Final diagnoses:  None   Discharge Instructions   None    ED Prescriptions   None    PDMP not reviewed this encounter.   Hardy Lia, NP 05/09/23 1004

## 2023-05-09 NOTE — Progress Notes (Signed)
  Echocardiogram 2D Echocardiogram has been performed.  Tiffany Sweeney L Emrik Erhard RDCS 05/09/2023, 9:11 AM

## 2023-05-09 NOTE — Discharge Instructions (Addendum)
 Apply medication as prescribed. You may take over-the-counter Zyrtec during the daytime or Benadryl  at bedtime as needed if the areas begin to itch. Avoid hot baths or showers while symptoms persist.  Recommend taking lukewarm baths. May apply cool cloths to the area to help with itching or discomfort. Avoid scratching, rubbing, or manipulating the areas while symptoms persist. If symptoms fail to improve with this treatment, please follow-up with your primary care physician as scheduled. Follow-up as needed.

## 2023-06-06 ENCOUNTER — Encounter: Payer: Self-pay | Admitting: Internal Medicine

## 2023-06-06 ENCOUNTER — Ambulatory Visit (INDEPENDENT_AMBULATORY_CARE_PROVIDER_SITE_OTHER): Payer: TRICARE For Life (TFL) | Admitting: Internal Medicine

## 2023-06-06 VITALS — BP 126/76 | HR 83 | Ht 64.0 in | Wt 161.4 lb

## 2023-06-06 DIAGNOSIS — I5020 Unspecified systolic (congestive) heart failure: Secondary | ICD-10-CM | POA: Diagnosis not present

## 2023-06-06 DIAGNOSIS — I10 Essential (primary) hypertension: Secondary | ICD-10-CM | POA: Diagnosis not present

## 2023-06-06 DIAGNOSIS — I2510 Atherosclerotic heart disease of native coronary artery without angina pectoris: Secondary | ICD-10-CM | POA: Diagnosis not present

## 2023-06-06 MED ORDER — CARVEDILOL 6.25 MG PO TABS
6.2500 mg | ORAL_TABLET | Freq: Two times a day (BID) | ORAL | 3 refills | Status: AC
Start: 2023-06-06 — End: 2024-05-31

## 2023-06-06 NOTE — Assessment & Plan Note (Signed)
 Remains adequately controlled on current antihypertensive regimen of amlodipine , losartan , and carvedilol .  Carvedilol  refilled today.

## 2023-06-06 NOTE — Patient Instructions (Signed)
 It was a pleasure to see you today.  Thank you for giving us  the opportunity to be involved in your care.  Below is a brief recap of your visit and next steps.  We will plan to see you again in 6 months.  Summary No medication changes today Carvedilol  refilled Follow up in 6 months

## 2023-06-06 NOTE — Progress Notes (Signed)
 Established Patient Office Visit  Subjective   Patient ID: Tiffany Sweeney, female    DOB: Feb 27, 1934  Age: 89 y.o. MRN: [id number]  Chief Complaint  Patient presents with   Hypertension    Six month follow up   Tiffany Sweeney returns to care today for follow-up.  She was last evaluated by me in December 2024 as a new patient presenting to establish care.  No medication changes were made at that time and 55-month follow-up was arranged for reassessment.  In the interim, she has been seen by oncology for follow-up in the setting of a history of breast cancer.  She has also been seen by cardiology for follow-up.  In the interim, she has presented to urgent care in the emergency department on multiple occasions in the setting of URI symptoms, sinus congestion, urinary frequency, and a rash.  There have otherwise been no acute interval events. Tiffany Sweeney reports feeling well today and has no acute concerns to discuss.   Past Medical History:  Diagnosis Date   Anxiety    Arthritis    Breast cancer (HCC)    Cardiomyopathy (HCC)    Coronary atherosclerosis    Mild, nonobstructive - cardiac catheterization October 2022   Essential hypertension    Family history of colon cancer    Family history of ovarian cancer    Family history of pancreatic cancer    Heart attack (HCC)    Hyperlipidemia    Skin cancer    Patient stated  she has low grade skin cancer in right hand   Past Surgical History:  Procedure Laterality Date   ABDOMINAL HYSTERECTOMY     CATARACT EXTRACTION W/PHACO Left 03/11/2013   Procedure: CATARACT EXTRACTION PHACO AND INTRAOCULAR LENS PLACEMENT (IOC);  Surgeon: Anner Kill, MD;  Location: [facility];  Service: Ophthalmology;  Laterality: Left;  CDE:7.54   CATARACT EXTRACTION W/PHACO Right 04/15/2013   Procedure: CATARACT EXTRACTION PHACO AND INTRAOCULAR LENS PLACEMENT (IOC);  Surgeon: Anner Kill, MD;  Location: [facility];  Service: Ophthalmology;  Laterality: Right;  CDE 9.38   LEFT  HEART CATH AND CORONARY ANGIOGRAPHY N/A 10/21/2020   Procedure: LEFT HEART CATH AND CORONARY ANGIOGRAPHY;  Surgeon: Kyra Phy, MD;  [facility];  Service: Cardiovascular;  Laterality: N/A;   MASTECTOMY Right    MASTECTOMY MODIFIED RADICAL Left 11/04/2013   Procedure: LEFT MASTECTOMY MODIFIED RADICAL;  Surgeon: Beau Bound, MD;  Location: [facility];  Service: General;  Laterality: Left;   PORTACATH PLACEMENT Right 12/06/2013   Procedure: INSERTION PORT-A-CATH-Right subclavian;  Surgeon: Beau Bound, MD;  Location: [facility];  Service: General;  Laterality: Right;   TOTAL HIP ARTHROPLASTY Right 08/05/2015   Procedure: RIGHT TOTAL HIP ARTHROPLASTY ANTERIOR APPROACH;  Surgeon: Liliane Rei, MD;  Location: [facility];  Service: Orthopedics;  Laterality: Right;   Social History   Tobacco Use   Smoking status: Former    Current packs/day: 0.00    Average packs/day: 0.3 packs/day for 30.0 years (7.5 ttl pk-yrs)    Types: Cigarettes    Start date: 10/10/1956    Quit date: 03/08/1985    Years since quitting: 38.2   Smokeless tobacco: Never   Tobacco comments:    quit 25 years ago  Vaping Use   Vaping status: Never Used  Substance Use Topics   Alcohol use: Yes    Comment: very rarely   Drug use: No   Family History  Problem Relation Age of Onset   Dementia  Father        Died at 66 in an accident   Diabetes Mother    Rectal cancer Mother 52       died from complications of DM   Pancreatic cancer Brother 29   Lung cancer Sister 74       "small oat cell"   Ovarian cancer Sister 13   Pancreatic cancer Sister    Cancer Cousin        maternal 1st cousin with either ovarian or uterine   Colon cancer Cousin        paternal first cousin dx at unknown age   Alcohol abuse Daughter    Allergies  Allergen Reactions   Statins Other (See Comments)    Pt states she has bone pain when on statin therapy.   Review of Systems  Constitutional:  Negative for chills and fever.   HENT:  Negative for sore throat.   Respiratory:  Negative for cough and shortness of breath.   Cardiovascular:  Negative for chest pain, palpitations and leg swelling.  Gastrointestinal:  Negative for abdominal pain, blood in stool, constipation, diarrhea, nausea and vomiting.  Genitourinary:  Negative for dysuria and hematuria.  Musculoskeletal:  Negative for myalgias.  Skin:  Negative for itching and rash.  Neurological:  Negative for dizziness and headaches.  Psychiatric/Behavioral:  Negative for depression and suicidal ideas.      Objective:     BP 126/76   Pulse 83   Ht 5\' 4"  (1.626 m)   Wt 161 lb 6.4 oz (73.2 kg)   SpO2 95%   BMI 27.70 kg/m  BP Readings from Last 3 Encounters:  06/06/23 126/76  05/09/23 (!) 146/82  04/18/23 132/84   Physical Exam Vitals reviewed.  Constitutional:      General: She is not in acute distress.    Appearance: Normal appearance. She is not toxic-appearing.  HENT:     Head: Normocephalic and atraumatic.     Right Ear: External ear normal.     Left Ear: External ear normal.     Nose: Nose normal. No congestion or rhinorrhea.     Mouth/Throat:     Mouth: Mucous membranes are moist.     Pharynx: Oropharynx is clear. No oropharyngeal exudate or posterior oropharyngeal erythema.  Eyes:     General: No scleral icterus.    Extraocular Movements: Extraocular movements intact.     Conjunctiva/sclera: Conjunctivae normal.     Pupils: Pupils are equal, round, and reactive to light.  Cardiovascular:     Rate and Rhythm: Normal rate and regular rhythm.     Pulses: Normal pulses.     Heart sounds: Normal heart sounds. No murmur heard.    No friction rub. No gallop.  Pulmonary:     Effort: Pulmonary effort is normal.     Breath sounds: Normal breath sounds. No wheezing, rhonchi or rales.  Abdominal:     General: Abdomen is flat. Bowel sounds are normal. There is no distension.     Palpations: Abdomen is soft.     Tenderness: There is no  abdominal tenderness.  Musculoskeletal:        General: No swelling. Normal range of motion.     Cervical back: Normal range of motion.     Right lower leg: No edema.     Left lower leg: No edema.  Lymphadenopathy:     Cervical: No cervical adenopathy.  Skin:    General: Skin is warm and dry.  Capillary Refill: Capillary refill takes less than 2 seconds.     Coloration: Skin is not jaundiced.  Neurological:     General: No focal deficit present.     Mental Status: She is alert and oriented to person, place, and time.  Psychiatric:        Mood and Affect: Mood normal.        Behavior: Behavior normal.   Last CBC Lab Results  Component Value Date   WBC 7.9 01/19/2023   HGB 13.4 01/19/2023   HCT 40.1 01/19/2023   MCV 88.5 01/19/2023   MCH 29.6 01/19/2023   RDW 12.7 01/19/2023   PLT 215 01/19/2023   Last metabolic panel Lab Results  Component Value Date   GLUCOSE 133 (H) 01/19/2023   NA 136 01/19/2023   K 3.4 (L) 01/19/2023   CL 105 01/19/2023   CO2 23 01/19/2023   BUN 17 01/19/2023   CREATININE 0.68 01/19/2023   GFRNONAA >60 01/19/2023   CALCIUM  8.5 (L) 01/19/2023   PROT 6.2 (L) 01/19/2023   ALBUMIN 3.3 (L) 01/19/2023   BILITOT 0.9 01/19/2023   ALKPHOS 60 01/19/2023   AST 15 01/19/2023   ALT 14 01/19/2023   ANIONGAP 8 01/19/2023   Last lipids Lab Results  Component Value Date   CHOL 209 (H) 11/04/2021   HDL 43 11/04/2021   LDLCALC 136 (H) 11/04/2021   TRIG 152 (H) 11/04/2021   CHOLHDL 4.9 11/04/2021   Last hemoglobin A1c Lab Results  Component Value Date   HGBA1C 5.5 10/22/2020   Last thyroid  functions Lab Results  Component Value Date   TSH 2.074 11/04/2021   Last vitamin D  Lab Results  Component Value Date   VD25OH 33.2 12/23/2014   Last vitamin B12 and Folate Lab Results  Component Value Date   VITAMINB12 312 11/04/2021     Assessment & Plan:   Problem List Items Addressed This Visit       Essential hypertension   Remains  adequately controlled on current antihypertensive regimen of amlodipine , losartan , and carvedilol .  Carvedilol  refilled today.      CAD (coronary artery disease) - Primary   Denies recent chest pain.  Recently seen by cardiology for follow-up.      HFrEF (heart failure with reduced ejection fraction) (HCC)   HFrecEF.  Normal EF on updated TTE from last month.  She appears euvolemic on exam.  Recently seen by cardiology for follow-up.  She remains on losartan  and carvedilol .      Return in about 6 months (around 12/06/2023).   Tobi Fortes, MD

## 2023-06-06 NOTE — Assessment & Plan Note (Signed)
 HFrecEF.  Normal EF on updated TTE from last month.  She appears euvolemic on exam.  Recently seen by cardiology for follow-up.  She remains on losartan  and carvedilol .

## 2023-06-06 NOTE — Assessment & Plan Note (Signed)
 Denies recent chest pain.  Recently seen by cardiology for follow-up.

## 2023-06-19 ENCOUNTER — Inpatient Hospital Stay: Payer: Medicare Other | Attending: Hematology

## 2023-06-19 VITALS — BP 139/81 | HR 70 | Temp 97.2°F | Resp 18

## 2023-06-19 DIAGNOSIS — Z853 Personal history of malignant neoplasm of breast: Secondary | ICD-10-CM | POA: Diagnosis present

## 2023-06-19 DIAGNOSIS — Z95828 Presence of other vascular implants and grafts: Secondary | ICD-10-CM

## 2023-06-19 DIAGNOSIS — Z452 Encounter for adjustment and management of vascular access device: Secondary | ICD-10-CM | POA: Insufficient documentation

## 2023-06-19 MED ORDER — SODIUM CHLORIDE 0.9% FLUSH: 10.0000 mL | INTRAVENOUS | Status: DC | PRN

## 2023-06-19 MED ORDER — HEPARIN SOD (PORK) LOCK FLUSH 100 UNIT/ML IV SOLN
500.0000 [IU] | Freq: Once | INTRAVENOUS | Status: AC
  Administered 2023-06-19: 500 [IU] via INTRAVENOUS

## 2023-06-19 NOTE — Patient Instructions (Signed)
 [facility] [place] - A DEPT OF [facility]  Discharge Instructions: Thank you for choosing [facility] to provide your oncology and hematology care.  If you have a lab appointment with the Cancer Center - please note that after April 8th, 2024, all labs will be drawn in the cancer center.  You do not have to check in or register with the main entrance as you have in the past but will complete your check-in in the cancer center.  Wear comfortable clothing and clothing appropriate for easy access to any Portacath or PICC line.   We strive to give you quality time with your provider. You may need to reschedule your appointment if you arrive late (15 or more minutes).  Arriving late affects you and other patients whose appointments are after yours.  Also, if you miss three or more appointments without notifying the office, you may be dismissed from the clinic at the provider's discretion.      For prescription refill requests, have your pharmacy contact our office and allow 72 hours for refills to be completed.    Today you received the following:port flush only   To help prevent nausea and vomiting after your treatment, we encourage you to take your nausea medication as directed.  BELOW ARE SYMPTOMS THAT SHOULD BE REPORTED IMMEDIATELY: *FEVER GREATER THAN 100.4 F (38 C) OR HIGHER *CHILLS OR SWEATING *NAUSEA AND VOMITING THAT IS NOT CONTROLLED WITH YOUR NAUSEA MEDICATION *UNUSUAL SHORTNESS OF BREATH *UNUSUAL BRUISING OR BLEEDING *URINARY PROBLEMS (pain or burning when urinating, or frequent urination) *BOWEL PROBLEMS (unusual diarrhea, constipation, pain near the anus) TENDERNESS IN MOUTH AND THROAT WITH OR WITHOUT PRESENCE OF ULCERS (sore throat, sores in mouth, or a toothache) UNUSUAL RASH, SWELLING OR PAIN  UNUSUAL VAGINAL DISCHARGE OR ITCHING   Items with * indicate a potential emergency and should be followed up as soon as possible or go to the  Emergency Department if any problems should occur.  Please show the CHEMOTHERAPY ALERT CARD or IMMUNOTHERAPY ALERT CARD at check-in to the Emergency Department and triage nurse.  Should you have questions after your visit or need to cancel or reschedule your appointment, please contact [facility] [place] - A DEPT OF [facility] [phone]  and follow the prompts.  Office hours are 8:00 a.m. to 4:30 p.m. Monday - Friday. Please note that voicemails left after 4:00 p.m. may not be returned until the following business day.  We are closed weekends and major holidays. You have access to a nurse at all times for urgent questions. Please call the main number to the clinic [phone] and follow the prompts.  For any non-urgent questions, you may also contact your provider using MyChart. We now offer e-Visits for anyone 47 and older to request care online for non-urgent symptoms. For details visit [email or web address].   Also download the MyChart app! Go to the app store, search "MyChart", open the app, select [place], and log in with your MyChart username and password.

## 2023-06-19 NOTE — Progress Notes (Signed)
Tiffany Sweeney presented for Portacath access and flush. Proper placement of portacath confirmed by CXR. Portacath located right chest wall accessed with  H 20 needle. Good blood return present. Portacath flushed with 20ml NS and 500U/5ml Heparin and needle removed intact. Procedure without incident. Patient tolerated procedure well.   

## 2023-07-19 ENCOUNTER — Ambulatory Visit
Admission: EM | Admit: 2023-07-19 | Discharge: 2023-07-19 | Disposition: A | Discharge status: Home / Self Care | Attending: Family Medicine | Admitting: Family Medicine

## 2023-07-19 ENCOUNTER — Ambulatory Visit (HOSPITAL_COMMUNITY)
Admission: RE | Admit: 2023-07-19 | Discharge: 2023-07-19 | Disposition: A | Discharge status: Home / Self Care | Source: Ambulatory Visit | Attending: Family Medicine | Admitting: Family Medicine

## 2023-07-19 DIAGNOSIS — R1032 Left lower quadrant pain: Secondary | ICD-10-CM | POA: Insufficient documentation

## 2023-07-19 NOTE — ED Provider Notes (Signed)
 [facility]    CSN: [id number] Arrival date & time: 07/19/23  9061      History   Chief Complaint No chief complaint on file.   HPI Tiffany Sweeney is a 89 y.o. female.   Patient presenting today with new onset left groin heat, not quite a burning sensation but the sensation of being warm in the area.  Also having some worsening left lower extremity swelling, tightness in feet.  Denies new discoloration, point tenderness in any area down the leg, weakness, numbness, tingling, back pain, bowel or bladder incontinence, known injury to the area, chest pain, shortness of breath, palpitations.  No past history of similar issues.    Past Medical History:  Diagnosis Date   Anxiety    Arthritis    Breast cancer (HCC)    Cardiomyopathy (HCC)    Coronary atherosclerosis    Mild, nonobstructive - cardiac catheterization October 2022   Essential hypertension    Family history of colon cancer    Family history of ovarian cancer    Family history of pancreatic cancer    Heart attack [facility])    Hyperlipidemia    Skin cancer    Patient stated  she has low grade skin cancer in right hand    Patient Active Problem List   Diagnosis Date Noted   Essential hypertension 12/08/2022   CAD (coronary artery disease) 12/08/2022   HFrEF (heart failure with reduced ejection fraction) (HCC) 12/08/2022   S/P hysterectomy with oophorectomy 06/17/2022   Cystocele, midline 06/17/2022   Rectocele 06/17/2022   Constipation 06/17/2022   NSTEMI (non-ST elevated myocardial infarction) (HCC) 10/21/2020   Health care maintenance 11/02/2015   Blood glucose elevated 11/02/2015   OA (osteoarthritis) of hip 08/05/2015   Genetic testing 04/29/2014   Family history of ovarian cancer    Family history of colon cancer    Family history of pancreatic cancer    Breast cancer (HCC) 11/04/2013   Low back pain 11/12/2012    Past Surgical History:  Procedure Laterality Date   ABDOMINAL  HYSTERECTOMY     CATARACT EXTRACTION W/PHACO Left 03/11/2013   Procedure: CATARACT EXTRACTION PHACO AND INTRAOCULAR LENS PLACEMENT (IOC);  Surgeon: Cherene Mania, MD;  Location: [facility];  Service: Ophthalmology;  Laterality: Left;  CDE:7.54   CATARACT EXTRACTION W/PHACO Right 04/15/2013   Procedure: CATARACT EXTRACTION PHACO AND INTRAOCULAR LENS PLACEMENT (IOC);  Surgeon: Cherene Mania, MD;  Location: [facility];  Service: Ophthalmology;  Laterality: Right;  CDE 9.38   LEFT HEART CATH AND CORONARY ANGIOGRAPHY N/A 10/21/2020   Procedure: LEFT HEART CATH AND CORONARY ANGIOGRAPHY;  Surgeon: Wendel Lurena POUR, MD;  [facility]: [facility];  Service: Cardiovascular;  Laterality: N/A;   MASTECTOMY Right    MASTECTOMY MODIFIED RADICAL Left 11/04/2013   Procedure: LEFT MASTECTOMY MODIFIED RADICAL;  Surgeon: Oneil DELENA Budge, MD;  Location: [facility];  Service: General;  Laterality: Left;   PORTACATH PLACEMENT Right 12/06/2013   Procedure: INSERTION PORT-A-CATH-Right subclavian;  Surgeon: Oneil DELENA Budge, MD;  Location: [facility];  Service: General;  Laterality: Right;   TOTAL HIP ARTHROPLASTY Right 08/05/2015   Procedure: RIGHT TOTAL HIP ARTHROPLASTY ANTERIOR APPROACH;  Surgeon: Dempsey Moan, MD;  Location: [facility];  Service: Orthopedics;  Laterality: Right;    OB History     Gravida  4   Para  3   Term  3   Preterm      AB  1   Living  2  SAB  1   IAB      Ectopic      Multiple      Live Births               Home Medications    Prior to Admission medications   Medication Sig Start Date End Date Taking? Authorizing Provider  amLODipine  (NORVASC ) 5 MG tablet Take 1 tablet (5 mg total) by mouth daily. 04/18/23   Debera Jayson MATSU, MD  carvedilol  (COREG ) 6.25 MG tablet Take 1 tablet (6.25 mg total) by mouth 2 (two) times daily with a meal. 06/06/23 05/31/24  Melvenia Manus BRAVO, MD  losartan  (COZAAR ) 100 MG tablet Take 1 tablet (100 mg total) by mouth daily. 04/18/23   Debera Jayson MATSU, MD   triamcinolone  cream (KENALOG ) 0.1 % Apply 1 Application topically 2 (two) times daily. 05/09/23   Leath-Warren, Etta PARAS, NP    Family History Family History  Problem Relation Age of Onset   Dementia Father        Died at 51 in an accident   Diabetes Mother    Rectal cancer Mother 1       died from complications of DM   Pancreatic cancer Brother 31   Lung cancer Sister 6       small oat cell   Ovarian cancer Sister 48   Pancreatic cancer Sister    Cancer Cousin        maternal 1st cousin with either ovarian or uterine   Colon cancer Cousin        paternal first cousin dx at unknown age   Alcohol abuse Daughter     Social History Social History   Tobacco Use   Smoking status: Former    Current packs/day: 0.00    Average packs/day: 0.3 packs/day for 30.0 years (7.5 ttl pk-yrs)    Types: Cigarettes    Start date: 10/10/1956    Quit date: 03/08/1985    Years since quitting: 38.3   Smokeless tobacco: Never   Tobacco comments:    quit 25 years ago  Vaping Use   Vaping status: Never Used  Substance Use Topics   Alcohol use: Yes    Comment: very rarely   Drug use: No     Allergies   Statins   Review of Systems Review of Systems Per HPI  Physical Exam Triage Vital Signs ED Triage Vitals  Encounter Vitals Group     BP 07/19/23 1000 136/76     Girls Systolic BP Percentile --      Girls Diastolic BP Percentile --      Boys Systolic BP Percentile --      Boys Diastolic BP Percentile --      Pulse Rate 07/19/23 1000 69     Resp 07/19/23 1000 14     Temp 07/19/23 1000 98.2 F (36.8 C)     Temp Source 07/19/23 1000 Oral     SpO2 07/19/23 1000 93 %     Weight --      Height --      Head Circumference --      Peak Flow --      Pain Score 07/19/23 1004 5     Pain Loc --      Pain Education --      Exclude from Growth Chart --    No data found.  Updated Vital Signs BP 136/76 (BP Location: Right Arm)   Pulse 69   Temp 98.2 F (  36.8 C) (Oral)   Resp  14   SpO2 93%   Visual Acuity Right Eye Distance:   Left Eye Distance:   Bilateral Distance:    Right Eye Near:   Left Eye Near:    Bilateral Near:     Physical Exam Vitals and nursing note reviewed.  Constitutional:      Appearance: Normal appearance. She is not ill-appearing.  HENT:     Head: Atraumatic.  Eyes:     Extraocular Movements: Extraocular movements intact.     Conjunctiva/sclera: Conjunctivae normal.  Cardiovascular:     Rate and Rhythm: Normal rate.  Pulmonary:     Effort: Pulmonary effort is normal.  Musculoskeletal:        General: Swelling present. No tenderness or signs of injury. Normal range of motion.     Cervical back: Normal range of motion and neck supple.     Comments: 2+ edema to the left lower extremity, worse below the knee.  1+ edema to the right lower extremity which she states is near baseline.  No obvious discoloration, palpable masses or tenderness to palpation to the left groin region  Skin:    General: Skin is warm and dry.  Neurological:     Mental Status: She is alert and oriented to person, place, and time.     Comments: Left lower extremity neurovascularly intact  Psychiatric:        Mood and Affect: Mood normal.        Thought Content: Thought content normal.        Judgment: Judgment normal.      UC Treatments / Results  Labs (all labs ordered are listed, but only abnormal results are displayed) Labs Reviewed - No data to display  EKG   Radiology No results found.  Procedures Procedures (including critical care time)  Medications Ordered in UC Medications - No data to display  Initial Impression / Assessment and Plan / UC Course  I have reviewed the triage vital signs and the nursing notes.  Pertinent labs & imaging results that were available during my care of the patient were reviewed by me and considered in my medical decision making (see chart for details).     Vital signs within normal limits today, she is  well-appearing and in no acute distress.  Will rule out more emergent potential cause of her symptoms with a venous ultrasound for DVT rule out.  She was sent directly to this imaging study from clinic today via private vehicle as she is hemodynamically stable.  If negative, discussed supportive over-the-counter medications, home care and close PCP follow-up.  Emergency department precautions reviewed.  Final Clinical Impressions(s) / UC Diagnoses   Final diagnoses:  Left groin pain     Discharge Instructions      You may go to the [facility] outpatient imaging center for your ultrasound.  We will let you know if anything comes back abnormal on this.  Otherwise, stretches, heat, and close follow-up with your primary care provider.  If anything significantly worsens at any time go to the emergency department.    ED Prescriptions   None    PDMP not reviewed this encounter.   Stuart Vernell Norris, [place] 07/19/23 1200

## 2023-07-19 NOTE — ED Triage Notes (Signed)
 Pt reports pain in the groin and swelling in the legs x 1 day.

## 2023-07-19 NOTE — Discharge Instructions (Signed)
 You may go to the [facility] outpatient imaging center for your ultrasound.  We will let you know if anything comes back abnormal on this.  Otherwise, stretches, heat, and close follow-up with your primary care provider.  If anything significantly worsens at any time go to the emergency department.

## 2023-07-26 ENCOUNTER — Ambulatory Visit: Payer: Self-pay

## 2023-07-26 VITALS — BP 137/84 | HR 83 | Ht 64.0 in | Wt 162.1 lb

## 2023-07-26 DIAGNOSIS — H353220 Exudative age-related macular degeneration, left eye, stage unspecified: Secondary | ICD-10-CM

## 2023-07-26 DIAGNOSIS — I5020 Unspecified systolic (congestive) heart failure: Secondary | ICD-10-CM

## 2023-07-26 DIAGNOSIS — H353290 Exudative age-related macular degeneration, unspecified eye, stage unspecified: Secondary | ICD-10-CM | POA: Insufficient documentation

## 2023-07-26 MED ORDER — FUROSEMIDE 20 MG PO TABS: ORAL_TABLET | ORAL | 3 refills | Status: DC

## 2023-07-26 NOTE — Progress Notes (Signed)
 Established Patient Office Visit  Subjective   Patient ID: Tiffany Sweeney, female    DOB: [birth year]  Age: 89 y.o. MRN: [id number]  Chief Complaint  Patient presents with   Medical Management of Chronic Issues    Fluid retention is stomach and legs, also asking about her U/S results     HPI  Patient Active Problem List   Diagnosis Date Noted   Macular degeneration, wet (HCC) 07/26/2023   Essential hypertension 12/08/2022   CAD (coronary artery disease) 12/08/2022   HFrEF (heart failure with reduced ejection fraction) (HCC) 12/08/2022   S/P hysterectomy with oophorectomy 06/17/2022   Cystocele, midline 06/17/2022   Rectocele 06/17/2022   Constipation 06/17/2022   NSTEMI (non-ST elevated myocardial infarction) (HCC) 10/21/2020   Health care maintenance 11/02/2015   Blood glucose elevated 11/02/2015   OA (osteoarthritis) of hip 08/05/2015   Genetic testing 04/29/2014   Family history of ovarian cancer    Family history of colon cancer    Family history of pancreatic cancer    Breast cancer (HCC) 11/04/2013   Low back pain 11/12/2012    ROS    Objective:     BP 137/84   Pulse 83   Ht 5' 4 (1.626 m)   Wt 162 lb 1.9 oz (73.5 kg)   SpO2 96%   BMI 27.83 kg/m  BP Readings from Last 3 Encounters:  07/26/23 137/84  07/19/23 136/76  06/19/23 139/81   Wt Readings from Last 3 Encounters:  07/26/23 162 lb 1.9 oz (73.5 kg)  06/06/23 161 lb 6.4 oz (73.2 kg)  04/18/23 160 lb 12.8 oz (72.9 kg)     Physical Exam Vitals and nursing note reviewed.  Constitutional:      Appearance: Normal appearance.  HENT:     Head: Normocephalic.  Eyes:     Extraocular Movements: Extraocular movements intact.     Pupils: Pupils are equal, round, and reactive to light.  Cardiovascular:     Rate and Rhythm: Normal rate and regular rhythm.  Pulmonary:     Effort: Pulmonary effort is normal.     Breath sounds: Normal breath sounds.  Musculoskeletal:     Cervical back: Normal  range of motion and neck supple.  Neurological:     Mental Status: She is alert and oriented to person, place, and time.  Psychiatric:        Mood and Affect: Mood normal.        Thought Content: Thought content normal.      No results found for any visits on 07/26/23.    The ASCVD Risk score (Arnett DK, et al., 2019) failed to calculate for the following reasons:   The 2019 ASCVD risk score is only valid for ages 62 to 22   Risk score cannot be calculated because patient has a medical history suggesting prior/existing ASCVD    Assessment & Plan:   Problem List Items Addressed This Visit       Cardiovascular and Mediastinum   HFrEF (heart failure with reduced ejection fraction) (HCC)   HFrecEF.  Normal EF on recent TTE.  She appears euvolemic on exam.  Recently seen by cardiology for follow-up.  She remains on losartan  and carvedilol .  Lasix  refilled      Relevant Medications   furosemide  (LASIX ) 20 MG tablet     Other   Macular degeneration, wet (HCC) - Primary   Recently diagnosed by Dr. Zackary, ophthalmology       No follow-ups on file.  Leita Longs, FNP

## 2023-08-03 NOTE — Assessment & Plan Note (Addendum)
 HFrecEF.  Normal EF on recent TTE.  She appears euvolemic on exam.  Recently seen by cardiology for follow-up.  She remains on losartan  and carvedilol .  Lasix  refilled

## 2023-08-03 NOTE — Assessment & Plan Note (Signed)
 Recently diagnosed by Dr. Zackary, ophthalmology

## 2023-09-18 ENCOUNTER — Inpatient Hospital Stay: Payer: Medicare Other | Attending: Hematology

## 2023-09-18 DIAGNOSIS — Z452 Encounter for adjustment and management of vascular access device: Secondary | ICD-10-CM | POA: Diagnosis present

## 2023-09-18 DIAGNOSIS — Z853 Personal history of malignant neoplasm of breast: Secondary | ICD-10-CM | POA: Insufficient documentation

## 2023-09-18 NOTE — Progress Notes (Signed)
 Patient's port flushed without difficulty.  Good blood return noted with no bruising or swelling noted at site.  No orders for labs.  VSS with discharge and left in satisfactory condition with no s/s of distress noted. All follow ups as scheduled.   Tiffany Sweeney

## 2023-10-25 ENCOUNTER — Other Ambulatory Visit: Payer: Self-pay

## 2023-10-25 DIAGNOSIS — I5020 Unspecified systolic (congestive) heart failure: Secondary | ICD-10-CM

## 2023-12-06 ENCOUNTER — Encounter: Payer: Self-pay | Admitting: Emergency Medicine

## 2023-12-06 ENCOUNTER — Ambulatory Visit
Admission: EM | Admit: 2023-12-06 | Discharge: 2023-12-06 | Disposition: A | Discharge status: Home / Self Care | Attending: Family Medicine | Admitting: Family Medicine

## 2023-12-06 ENCOUNTER — Ambulatory Visit

## 2023-12-06 DIAGNOSIS — J029 Acute pharyngitis, unspecified: Secondary | ICD-10-CM | POA: Insufficient documentation

## 2023-12-06 LAB — POCT RAPID STREP A (OFFICE): Rapid Strep A Screen: NEGATIVE

## 2023-12-06 MED ORDER — LIDOCAINE VISCOUS HCL 2 % MT SOLN: 10.0000 mL | OROMUCOSAL | 0 refills | Status: AC | PRN

## 2023-12-06 NOTE — ED Provider Notes (Signed)
 [facility]    CSN: [id number] Arrival date & time: 12/06/23  0915      History   Chief Complaint No chief complaint on file.   HPI Tiffany Sweeney is a 89 y.o. female.   Patient presenting today with a progressively worsening sore throat that started this morning.  Denies significant nasal congestion, cough, chest pain, shortness of breath, abdominal pain, vomiting, diarrhea.  So far tried throat lozenges with minimal relief.    Past Medical History:  Diagnosis Date   Anxiety    Arthritis    Breast cancer (HCC)    Cardiomyopathy (HCC)    Coronary atherosclerosis    Mild, nonobstructive - cardiac catheterization October 2022   Essential hypertension    Family history of colon cancer    Family history of ovarian cancer    Family history of pancreatic cancer    Heart attack [facility])    Hyperlipidemia    Skin cancer    Patient stated  she has low grade skin cancer in right hand    Patient Active Problem List   Diagnosis Date Noted   Macular degeneration, wet (HCC) 07/26/2023   Essential hypertension 12/08/2022   CAD (coronary artery disease) 12/08/2022   HFrEF (heart failure with reduced ejection fraction) (HCC) 12/08/2022   S/P hysterectomy with oophorectomy 06/17/2022   Cystocele, midline 06/17/2022   Rectocele 06/17/2022   Constipation 06/17/2022   NSTEMI (non-ST elevated myocardial infarction) (HCC) 10/21/2020   Health care maintenance 11/02/2015   Blood glucose elevated 11/02/2015   OA (osteoarthritis) of hip 08/05/2015   Genetic testing 04/29/2014   Family history of ovarian cancer    Family history of colon cancer    Family history of pancreatic cancer    Breast cancer (HCC) 11/04/2013   Low back pain 11/12/2012    Past Surgical History:  Procedure Laterality Date   ABDOMINAL HYSTERECTOMY     CATARACT EXTRACTION W/PHACO Left 03/11/2013   Procedure: CATARACT EXTRACTION PHACO AND INTRAOCULAR LENS PLACEMENT (IOC);  Surgeon: Cherene Mania, MD;   Location: [facility];  Service: Ophthalmology;  Laterality: Left;  CDE:7.54   CATARACT EXTRACTION W/PHACO Right 04/15/2013   Procedure: CATARACT EXTRACTION PHACO AND INTRAOCULAR LENS PLACEMENT (IOC);  Surgeon: Cherene Mania, MD;  Location: [facility];  Service: Ophthalmology;  Laterality: Right;  CDE 9.38   LEFT HEART CATH AND CORONARY ANGIOGRAPHY N/A 10/21/2020   Procedure: LEFT HEART CATH AND CORONARY ANGIOGRAPHY;  Surgeon: Wendel Lurena POUR, MD;  Location: [facility];  Service: Cardiovascular;  Laterality: N/A;   MASTECTOMY Right    MASTECTOMY MODIFIED RADICAL Left 11/04/2013   Procedure: LEFT MASTECTOMY MODIFIED RADICAL;  Surgeon: Oneil DELENA Budge, MD;  Location: [facility];  Service: General;  Laterality: Left;   PORTACATH PLACEMENT Right 12/06/2013   Procedure: INSERTION PORT-A-CATH-Right subclavian;  Surgeon: Oneil DELENA Budge, MD;  Location: [facility];  Service: General;  Laterality: Right;   TOTAL HIP ARTHROPLASTY Right 08/05/2015   Procedure: RIGHT TOTAL HIP ARTHROPLASTY ANTERIOR APPROACH;  Surgeon: Dempsey Moan, MD;  Location: [facility];  Service: Orthopedics;  Laterality: Right;    OB History     Gravida  4   Para  3   Term  3   Preterm      AB  1   Living  2      SAB  1   IAB      Ectopic      Multiple      Live Births  Home Medications    Prior to Admission medications   Medication Sig Start Date End Date Taking? Authorizing Provider  lidocaine  (XYLOCAINE ) 2 % solution Use as directed 10 mLs in the mouth or throat every 3 (three) hours as needed. 12/06/23  Yes Stuart Vernell Norris, PA-C  amLODipine  (NORVASC ) 5 MG tablet Take 1 tablet (5 mg total) by mouth daily. 04/18/23   Debera Jayson MATSU, MD  carvedilol  (COREG ) 6.25 MG tablet Take 1 tablet (6.25 mg total) by mouth 2 (two) times daily with a meal. 06/06/23 05/31/24  Melvenia Manus BRAVO, MD  furosemide  (LASIX ) 20 MG tablet TAKE 1 TABLET BY MOUTH EVERY DAY AS NEEDED FOR WEIGHT GAIN OF 3 LBS DAILY OR 5 LBS WEEKLY  10/25/23   Bevely Doffing, FNP  losartan  (COZAAR ) 100 MG tablet Take 1 tablet (100 mg total) by mouth daily. 04/18/23   Debera Jayson MATSU, MD  triamcinolone  cream (KENALOG ) 0.1 % Apply 1 Application topically 2 (two) times daily. 05/09/23   Leath-Warren, Etta PARAS, NP    Family History Family History  Problem Relation Age of Onset   Dementia Father        Died at 55 in an accident   Diabetes Mother    Rectal cancer Mother 77       died from complications of DM   Pancreatic cancer Brother 57   Lung cancer Sister 88       small oat cell   Ovarian cancer Sister 26   Pancreatic cancer Sister    Cancer Cousin        maternal 1st cousin with either ovarian or uterine   Colon cancer Cousin        paternal first cousin dx at unknown age   Alcohol abuse Daughter     Social History Social History   Tobacco Use   Smoking status: Former    Current packs/day: 0.00    Average packs/day: 0.3 packs/day for 30.0 years (7.5 ttl pk-yrs)    Types: Cigarettes    Start date: 10/10/1956    Quit date: 03/08/1985    Years since quitting: 38.7   Smokeless tobacco: Never   Tobacco comments:    quit 25 years ago  Vaping Use   Vaping status: Never Used  Substance Use Topics   Alcohol use: Yes    Comment: very rarely   Drug use: No     Allergies   Statins   Review of Systems Review of Systems Per HPI  Physical Exam Triage Vital Signs ED Triage Vitals  Encounter Vitals Group     BP 12/06/23 0920 (!) 152/81     Girls Systolic BP Percentile --      Girls Diastolic BP Percentile --      Boys Systolic BP Percentile --      Boys Diastolic BP Percentile --      Pulse Rate 12/06/23 0920 95     Resp 12/06/23 0920 18     Temp 12/06/23 0920 97.9 F (36.6 C)     Temp Source 12/06/23 0920 Oral     SpO2 12/06/23 0920 95 %     Weight --      Height --      Head Circumference --      Peak Flow --      Pain Score 12/06/23 0922 4     Pain Loc --      Pain Education --      Exclude from  Growth Chart --  No data found.  Updated Vital Signs BP (!) 152/81 (BP Location: Right Arm)   Pulse 95   Temp 97.9 F (36.6 C) (Oral)   Resp 18   SpO2 95%   Visual Acuity Right Eye Distance:   Left Eye Distance:   Bilateral Distance:    Right Eye Near:   Left Eye Near:    Bilateral Near:     Physical Exam Vitals and nursing note reviewed.  Constitutional:      Appearance: Normal appearance. She is not ill-appearing.  HENT:     Head: Atraumatic.     Nose: Rhinorrhea present.     Mouth/Throat:     Mouth: Mucous membranes are moist.     Pharynx: Oropharynx is clear. Posterior oropharyngeal erythema present.  Eyes:     Extraocular Movements: Extraocular movements intact.     Conjunctiva/sclera: Conjunctivae normal.  Cardiovascular:     Rate and Rhythm: Normal rate and regular rhythm.     Heart sounds: Normal heart sounds.  Pulmonary:     Effort: Pulmonary effort is normal.     Breath sounds: Normal breath sounds. No wheezing or rales.  Musculoskeletal:        General: Normal range of motion.     Cervical back: Normal range of motion and neck supple.  Lymphadenopathy:     Cervical: No cervical adenopathy.  Skin:    General: Skin is warm and dry.  Neurological:     Mental Status: She is alert and oriented to person, place, and time.  Psychiatric:        Mood and Affect: Mood normal.        Thought Content: Thought content normal.        Judgment: Judgment normal.      UC Treatments / Results  Labs (all labs ordered are listed, but only abnormal results are displayed) Labs Reviewed  POCT RAPID STREP A (OFFICE) - Normal  CULTURE, GROUP A STREP [facility])    EKG   Radiology No results found.  Procedures Procedures (including critical care time)  Medications Ordered in UC Medications - No data to display  Initial Impression / Assessment and Plan / UC Course  I have reviewed the triage vital signs and the nursing notes.  Pertinent labs & imaging  results that were available during my care of the patient were reviewed by me and considered in my medical decision making (see chart for details).     Rapid strep negative, throat culture pending for further evaluation.  Suspect viral pharyngitis.  Treat with viscous lidocaine , supportive over-the-counter medications, home care.  Return for significantly worsening symptoms.  Final Clinical Impressions(s) / UC Diagnoses   Final diagnoses:  Acute pharyngitis, unspecified etiology     Discharge Instructions      Your strep test was negative today, I have sent out a throat culture to be sure there is no other bacterial growth and we will call you if this is abnormal.  I suspect a viral cause of your symptoms.  I have sent in some numbing liquid for you to gargle every 3 hours as needed and spit and you may take over-the-counter remedies such as Coricidin HBP, Flonase and/or Astelin nasal spray, nasal saline, humidifiers, throat lozenges.  Follow-up for significantly worsening symptoms.    ED Prescriptions     Medication Sig Dispense Auth. Provider   lidocaine  (XYLOCAINE ) 2 % solution Use as directed 10 mLs in the mouth or throat every 3 (three) hours as needed.  100 mL Stuart Vernell Norris, [place]      PDMP not reviewed this encounter.   Stuart Vernell Norris, [place] 12/06/23 1827

## 2023-12-06 NOTE — Discharge Instructions (Signed)
 Your strep test was negative today, I have sent out a throat culture to be sure there is no other bacterial growth and we will call you if this is abnormal.  I suspect a viral cause of your symptoms.  I have sent in some numbing liquid for you to gargle every 3 hours as needed and spit and you may take over-the-counter remedies such as Coricidin HBP, Flonase and/or Astelin nasal spray, nasal saline, humidifiers, throat lozenges.  Follow-up for significantly worsening symptoms.

## 2023-12-06 NOTE — ED Triage Notes (Signed)
Sore throat that started this morning.

## 2023-12-09 LAB — CULTURE, GROUP A STREP (THRC)

## 2023-12-11 ENCOUNTER — Ambulatory Visit (HOSPITAL_COMMUNITY): Payer: Self-pay

## 2023-12-14 ENCOUNTER — Inpatient Hospital Stay: Attending: Hematology

## 2023-12-14 DIAGNOSIS — Z9011 Acquired absence of right breast and nipple: Secondary | ICD-10-CM | POA: Diagnosis not present

## 2023-12-14 DIAGNOSIS — I890 Lymphedema, not elsewhere classified: Secondary | ICD-10-CM | POA: Diagnosis not present

## 2023-12-14 DIAGNOSIS — Z8041 Family history of malignant neoplasm of ovary: Secondary | ICD-10-CM | POA: Insufficient documentation

## 2023-12-14 DIAGNOSIS — Z800 Family history of malignant neoplasm of digestive organs: Secondary | ICD-10-CM | POA: Diagnosis not present

## 2023-12-14 DIAGNOSIS — Z87891 Personal history of nicotine dependence: Secondary | ICD-10-CM | POA: Diagnosis not present

## 2023-12-14 DIAGNOSIS — Z853 Personal history of malignant neoplasm of breast: Secondary | ICD-10-CM | POA: Diagnosis present

## 2023-12-14 DIAGNOSIS — C50512 Malignant neoplasm of lower-outer quadrant of left female breast: Secondary | ICD-10-CM

## 2023-12-14 LAB — CBC WITH DIFFERENTIAL/PLATELET
Abs Immature Granulocytes: 0.01 K/uL (ref 0.00–0.07)
Basophils Absolute: 0 K/uL (ref 0.0–0.1)
Basophils Relative: 1 %
Eosinophils Absolute: 0.1 K/uL (ref 0.0–0.5)
Eosinophils Relative: 3 %
HCT: 41 % (ref 36.0–46.0)
Hemoglobin: 13.2 g/dL (ref 12.0–15.0)
Immature Granulocytes: 0 %
Lymphocytes Relative: 23 %
Lymphs Abs: 1.1 K/uL (ref 0.7–4.0)
MCH: 29.5 pg (ref 26.0–34.0)
MCHC: 32.2 g/dL (ref 30.0–36.0)
MCV: 91.5 fL (ref 80.0–100.0)
Monocytes Absolute: 0.4 K/uL (ref 0.1–1.0)
Monocytes Relative: 8 %
Neutro Abs: 3.2 K/uL (ref 1.7–7.7)
Neutrophils Relative %: 65 %
Platelets: 192 K/uL (ref 150–400)
RBC: 4.48 MIL/uL (ref 3.87–5.11)
RDW: 12.8 % (ref 11.5–15.5)
WBC: 4.9 K/uL (ref 4.0–10.5)
nRBC: 0 % (ref 0.0–0.2)

## 2023-12-14 LAB — COMPREHENSIVE METABOLIC PANEL WITH GFR
ALT: 10 U/L (ref 0–44)
AST: 21 U/L (ref 15–41)
Albumin: 3.8 g/dL (ref 3.5–5.0)
Alkaline Phosphatase: 73 U/L (ref 38–126)
Anion gap: 15 (ref 5–15)
BUN: 18 mg/dL (ref 8–23)
CO2: 20 mmol/L — ABNORMAL LOW (ref 22–32)
Calcium: 9.2 mg/dL (ref 8.9–10.3)
Chloride: 107 mmol/L (ref 98–111)
Creatinine, Ser: 0.76 mg/dL (ref 0.44–1.00)
GFR, Estimated: >60 mL/min (ref >60)
Glucose, Bld: 157 mg/dL — ABNORMAL HIGH (ref 70–99)
Potassium: 3.6 mmol/L (ref 3.5–5.1)
Sodium: 143 mmol/L (ref 135–145)
Total Bilirubin: 0.4 mg/dL (ref 0.0–1.2)
Total Protein: 6.6 g/dL (ref 6.5–8.1)

## 2023-12-14 NOTE — Progress Notes (Signed)
 Patients port flushed without difficulty.  Good blood return noted with no bruising or swelling noted at site.  Labs drawn per orders.  VSS with discharge and left in satisfactory condition with no s/s of distress noted. All follow ups as scheduled.       Kierre Deines

## 2023-12-15 LAB — CANCER ANTIGEN 15-3: CA 15-3: 22 U/mL (ref 0.0–25.0)

## 2023-12-16 NOTE — Progress Notes (Unsigned)
 [facility] [address], [place] [address]   CLINIC:  Medical Oncology/Hematology  PCP:  Bevely Doffing, FNP [address] / [place] [address] [phone]   REASON FOR VISIT:  Follow-up for stage I left-sided triple negative breast cancer (2015) + right sided breast cancer (1997), s/p bilateral mastectomy  PRIOR THERAPY: - Right mastectomy 1997 - Left mastectomy lymph node dissection on 11/04/2013 - 3 cycles of TC in the adjuvant setting (fourth cycle not given due to fatigue and neuropathy), completed on 02/03/2014  CURRENT THERAPY: Surveillance  BRIEF ONCOLOGIC HISTORY:   Oncology History  Breast cancer (HCC)  07/25/2013 Imaging   Plain film of the lumbar spine, complete 4 view. Chronic lumbar degenerative change most prominent at L4-5. No acute abnormality and no change from prior study   10/21/2013 Initial Diagnosis   Left breast cancer, core biopsy, ER negative, PR negative, HER-2/neu negative, Ki-67 of 13%, infiltrating duct cell   11/04/2013 Surgery   Left modified radical mastectomy, 1.8 cm infiltrating duct cell carcinoma, ER negative, PR negative, HER-2/neu not overexpressed, 9 lymph nodes negative. Stage TI cN0 M0.   12/06/2013 Imaging   Portable chest x-ray showing no pneumothorax no lung edema or consolidation Port-A-Cath tip in the SVC.   12/23/2013 - 02/03/2014 Chemotherapy   Cycle 1 of TC initiated on 12/23/2013, patient completed 3 cycles of prescirbed therapy     CANCER STAGING: Cancer Staging  Breast cancer (HCC) Staging form: Breast, AJCC 7th Edition - Clinical: Stage IA (T1a, N0, M0) - Unsigned   INTERVAL HISTORY:   [name], a 89 y.o. female, returns for routine follow-up of her her stage I left breast triple negative breast cancer (2015) and her remote history of right breast cancer (1997).  Tiffany Sweeney was last seen on 12/19/2022 by Dr. Rogers.   In the interim since last visit, she has not  had any surgeries, hospitalizations, or changes in baseline health status.   She is receiving injections to her eyes for macular degeneration. At today's visit, she reports feeling fairly well.  She  reports 60% energy and 100% appetite.   She  is maintaining stable weight at this time.  She has ongoing lymphedema in bilateral upper extremities, right greater than left. She is also concerned by some increased peripheral edema in her legs, but notes that she has as needed Lasix  prescribed by her PCP; she will discuss this further with her primary care provider. No new onset chest wall lumps or axillary lymphadenopathy. Denies any new aches or pains, headaches, or abdominal pain.  ASSESSMENT & PLAN:  1.  Stage I (T1c N0) left TNBC: - Left mastectomy and lymph node dissection on 11/04/2013, 1.8 cm IDC, 0/9 lymph nodes positive - 3 cycles of TC in the adjuvant setting, fourth cycle not given due to fatigue and neuropathy completed on 02/03/2014 - Genetic testing negative in April 2016 - Physical exam (12/21/2023): Bilateral mastectomy sites with no palpable masses or nodules.  No adenopathy palpable. - Reviewed labs from 12/14/2023: Normal LFTs and creatinine.  CBC normal.  CA 15-3 was normal. - No red flag symptoms per patient history.   - PLAN: Discussed with patient that as she is now 10 years out from her most recent breast cancer, she is eligible for discharge to PCP.  Patient would like to continue follow-up with us .  She will come back in 1 year with repeat labs and exam. -- Referral to lymphedema clinic for  bilateral postmastectomy lymphedema. - Advised to discuss lower extremity edema with PCP, with Lasix  as prescribed by PCP  2.  Right breast cancer - S/p right mastectomy 1997  PLAN SUMMARY: >> Port flush every 3 months >> Referral to lymphedema clinic (outpatient physical therapy) has been entered >> Labs in 1 year = CBC/D, CMP, CA 15-3 >> OFFICE visit in 1 year (after labs)     REVIEW OF SYSTEMS:   Review of Systems  Constitutional:  Positive for fatigue. Negative for appetite change, chills, diaphoresis, fever and unexpected weight change.  HENT:   Positive for sore throat. Negative for lump/mass and nosebleeds.   Eyes:  Negative for eye problems.  Respiratory:  Positive for shortness of breath (with exertion). Negative for cough and hemoptysis.   Cardiovascular:  Positive for leg swelling. Negative for chest pain and palpitations.  Gastrointestinal:  Positive for constipation. Negative for abdominal pain, blood in stool, diarrhea, nausea and vomiting.  Genitourinary:  Negative for hematuria.   Skin: Negative.   Neurological:  Positive for numbness. Negative for dizziness, headaches and light-headedness.  Hematological:  Does not bruise/bleed easily.    PHYSICAL EXAM:   Performance status (ECOG): 1 - Symptomatic but completely ambulatory  There were no vitals filed for this visit. Wt Readings from Last 3 Encounters:  07/26/23 162 lb 1.9 oz (73.5 kg)  06/06/23 161 lb 6.4 oz (73.2 kg)  04/18/23 160 lb 12.8 oz (72.9 kg)   Physical Exam Constitutional:      Appearance: Normal appearance. She is normal weight.  Cardiovascular:     Heart sounds: Normal heart sounds.  Pulmonary:     Breath sounds: Normal breath sounds.  Chest:  Breasts:    Right: Absent.     Left: Absent.  Musculoskeletal:        General: Swelling present.     Comments: Bilateral upper extremity nonpitting lymphedema. Bilateral lower extremity pitting edema, 1-2+.  Lymphadenopathy:     Upper Body:     Right upper body: No axillary adenopathy.     Left upper body: No axillary adenopathy.  Neurological:     General: No focal deficit present.     Mental Status: Mental status is at baseline.  Psychiatric:        Behavior: Behavior normal. Behavior is cooperative.      PAST MEDICAL/SURGICAL HISTORY:  Past Medical History:  Diagnosis Date   Anxiety    Arthritis    Breast  cancer (HCC)    Cardiomyopathy (HCC)    Coronary atherosclerosis    Mild, nonobstructive - cardiac catheterization October 2022   Essential hypertension    Family history of colon cancer    Family history of ovarian cancer    Family history of pancreatic cancer    Heart attack (HCC)    Hyperlipidemia    Skin cancer    Patient stated  she has low grade skin cancer in right hand   Past Surgical History:  Procedure Laterality Date   ABDOMINAL HYSTERECTOMY     CATARACT EXTRACTION W/PHACO Left 03/11/2013   Procedure: CATARACT EXTRACTION PHACO AND INTRAOCULAR LENS PLACEMENT (IOC);  Surgeon: Cherene Mania, MD;  Location: [facility];  Service: Ophthalmology;  Laterality: Left;  CDE:7.54   CATARACT EXTRACTION W/PHACO Right 04/15/2013   Procedure: CATARACT EXTRACTION PHACO AND INTRAOCULAR LENS PLACEMENT (IOC);  Surgeon: Cherene Mania, MD;  Location: [facility];  Service: Ophthalmology;  Laterality: Right;  CDE 9.38   LEFT HEART CATH AND CORONARY ANGIOGRAPHY N/A 10/21/2020  Procedure: LEFT HEART CATH AND CORONARY ANGIOGRAPHY;  Surgeon: Wendel Lurena POUR, MD;  Location: [facility];  Service: Cardiovascular;  Laterality: N/A;   MASTECTOMY Right    MASTECTOMY MODIFIED RADICAL Left 11/04/2013   Procedure: LEFT MASTECTOMY MODIFIED RADICAL;  Surgeon: Oneil DELENA Budge, MD;  Location: [facility];  Service: General;  Laterality: Left;   PORTACATH PLACEMENT Right 12/06/2013   Procedure: INSERTION PORT-A-CATH-Right subclavian;  Surgeon: Oneil DELENA Budge, MD;  Location: [facility];  Service: General;  Laterality: Right;   TOTAL HIP ARTHROPLASTY Right 08/05/2015   Procedure: RIGHT TOTAL HIP ARTHROPLASTY ANTERIOR APPROACH;  Surgeon: Dempsey Moan, MD;  Location: [facility];  Service: Orthopedics;  Laterality: Right;    SOCIAL HISTORY:  Social History   Socioeconomic History   Marital status: Widowed    Spouse name: Not on file   Number of children: 3   Years of education: Not on file   Highest education level: Not on file   Occupational History   Not on file  Tobacco Use   Smoking status: Former    Current packs/day: 0.00    Average packs/day: 0.3 packs/day for 30.0 years (7.5 ttl pk-yrs)    Types: Cigarettes    Start date: 10/10/1956    Quit date: 03/08/1985    Years since quitting: 38.8   Smokeless tobacco: Never   Tobacco comments:    quit 25 years ago  Vaping Use   Vaping status: Never Used  Substance and Sexual Activity   Alcohol use: Yes    Comment: very rarely   Drug use: No   Sexual activity: Not Currently    Birth control/protection: Surgical    Comment: hyst  Other Topics Concern   Not on file  Social History Narrative   Not on file   Social Drivers of Health   Tobacco Use: Medium Risk (12/06/2023)   Patient History    Smoking Tobacco Use: Former    Smokeless Tobacco Use: Never    Passive Exposure: Not on Actuary Strain: Low Risk (06/17/2022)   Overall Financial Resource Strain (CARDIA)    Difficulty of Paying Living Expenses: Not hard at all  Food Insecurity: No Food Insecurity (06/17/2022)   Hunger Vital Sign    Worried About Running Out of Food in the Last Year: Never true    Ran Out of Food in the Last Year: Never true  Transportation Needs: No Transportation Needs (06/17/2022)   PRAPARE - Administrator, Civil Service (Medical): No    Lack of Transportation (Non-Medical): No  Physical Activity: Sufficiently Active (06/17/2022)   Exercise Vital Sign    Days of Exercise per Week: 3 days    Minutes of Exercise per Session: 120 min  Stress: No Stress Concern Present (06/17/2022)   [employer] of Occupational Health - Occupational Stress Questionnaire    Feeling of Stress : Only a little  Social Connections: Unknown (06/17/2022)   Social Connection and Isolation Panel    Frequency of Communication with Friends and Family: Once a week    Frequency of Social Gatherings with Friends and Family: More than three times a week    Attends Religious  Services: Patient declined    Database Administrator or Organizations: Patient declined    Attends Banker Meetings: Patient declined    Marital Status: Widowed  Intimate Partner Violence: Not At Risk (06/17/2022)   Humiliation, Afraid, Rape, and Kick questionnaire    Fear of Current or  Ex-Partner: No    Emotionally Abused: No    Physically Abused: No    Sexually Abused: No  Depression (PHQ2-9): Low Risk (07/26/2023)   Depression (PHQ2-9)    PHQ-2 Score: 2  Alcohol Screen: Low Risk (06/17/2022)   Alcohol Screen    Last Alcohol Screening Score (AUDIT): 0  Housing: Low Risk (06/17/2022)   Housing    Last Housing Risk Score: 0  Utilities: At Risk (06/17/2022)   AHC Utilities    Threatened with loss of utilities: Yes  Health Literacy: Not on file    FAMILY HISTORY:  Family History  Problem Relation Age of Onset   Dementia Father        Died at 43 in an accident   Diabetes Mother    Rectal cancer Mother 16       died from complications of DM   Pancreatic cancer Brother 67   Lung cancer Sister 29       small oat cell   Ovarian cancer Sister 87   Pancreatic cancer Sister    Cancer Cousin        maternal 1st cousin with either ovarian or uterine   Colon cancer Cousin        paternal first cousin dx at unknown age   Alcohol abuse Daughter     CURRENT MEDICATIONS:  Current Outpatient Medications  Medication Sig Dispense Refill   amLODipine  (NORVASC ) 5 MG tablet Take 1 tablet (5 mg total) by mouth daily. 90 tablet 3   carvedilol  (COREG ) 6.25 MG tablet Take 1 tablet (6.25 mg total) by mouth 2 (two) times daily with a meal. 180 tablet 3   furosemide  (LASIX ) 20 MG tablet TAKE 1 TABLET BY MOUTH EVERY DAY AS NEEDED FOR WEIGHT GAIN OF 3 LBS DAILY OR 5 LBS WEEKLY 90 tablet 0   lidocaine  (XYLOCAINE ) 2 % solution Use as directed 10 mLs in the mouth or throat every 3 (three) hours as needed. 100 mL 0   losartan  (COZAAR ) 100 MG tablet Take 1 tablet (100 mg total) by mouth  daily. 90 tablet 3   triamcinolone  cream (KENALOG ) 0.1 % Apply 1 Application topically 2 (two) times daily. 45 g 0   No current facility-administered medications for this visit.   Facility-Administered Medications Ordered in Other Visits  Medication Dose Route Frequency Provider Last Rate Last Admin   sodium chloride  0.9 % injection 10 mL  10 mL Intravenous PRN Casimir Milling A, RN   10 mL at 08/26/14 1333   sodium chloride  flush (NS) 0.9 % injection 10 mL  10 mL Intravenous PRN Penland, Clotilda POUR, MD   10 mL at 11/02/15 1117   sodium chloride  flush (NS) 0.9 % injection 10 mL  10 mL Intravenous PRN Letha Truman ORN, NP   10 mL at 08/16/16 1125    ALLERGIES:  Allergies[1]  LABORATORY DATA:  I have reviewed the labs as listed.     Latest Ref Rng & Units 12/14/2023    9:08 AM 01/19/2023    7:00 PM 11/11/2022    8:49 AM  CBC  WBC 4.0 - 10.5 K/uL 4.9  7.9  6.6   Hemoglobin 12.0 - 15.0 g/dL 86.7  86.5  87.1   Hematocrit 36.0 - 46.0 % 41.0  40.1  39.3   Platelets 150 - 400 K/uL 192  215  207       Latest Ref Rng & Units 12/14/2023    9:08 AM 01/19/2023    7:00 PM 11/11/2022  8:49 AM  CMP  Glucose 70 - 99 mg/dL 842  866  893   BUN 8 - 23 mg/dL 18  17  18    Creatinine 0.44 - 1.00 mg/dL 9.23  9.31  9.24   Sodium 135 - 145 mmol/L 143  136  141   Potassium 3.5 - 5.1 mmol/L 3.6  3.4  3.5   Chloride 98 - 111 mmol/L 107  105  107   CO2 22 - 32 mmol/L 20  23  26    Calcium  8.9 - 10.3 mg/dL 9.2  8.5  8.9   Total Protein 6.5 - 8.1 g/dL 6.6  6.2  6.3   Total Bilirubin 0.0 - 1.2 mg/dL 0.4  0.9  0.4   Alkaline Phos 38 - 126 U/L 73  60  63   AST 15 - 41 U/L 21  15  16    ALT 0 - 44 U/L 10  14  13      DIAGNOSTIC IMAGING:  I have independently reviewed the scans and discussed with the patient. No results found.   WRAP UP:  All questions were answered. The patient knows to call the clinic with any problems, questions or concerns.  Medical decision making: Moderate  Time spent on  visit: I spent 20 minutes counseling the patient face to face. The total time spent in the appointment was 30 minutes and more than 50% was on counseling.  Pleasant CHRISTELLA Barefoot, PA-C  12/21/2023 10:23 AM      [1]  Allergies Allergen Reactions   Statins Other (See Comments)    Pt states she has bone pain when on statin therapy.

## 2023-12-18 ENCOUNTER — Inpatient Hospital Stay: Payer: Medicare Other

## 2023-12-21 ENCOUNTER — Inpatient Hospital Stay: Admitting: Physician Assistant

## 2023-12-21 VITALS — BP 124/77 | HR 84 | Temp 98.8°F | Resp 18 | Wt 160.5 lb

## 2023-12-21 DIAGNOSIS — Z171 Estrogen receptor negative status [ER-]: Secondary | ICD-10-CM | POA: Diagnosis not present

## 2023-12-21 DIAGNOSIS — Z853 Personal history of malignant neoplasm of breast: Secondary | ICD-10-CM | POA: Diagnosis not present

## 2023-12-21 DIAGNOSIS — I972 Postmastectomy lymphedema syndrome: Secondary | ICD-10-CM

## 2023-12-21 DIAGNOSIS — Z95828 Presence of other vascular implants and grafts: Secondary | ICD-10-CM | POA: Diagnosis not present

## 2023-12-21 DIAGNOSIS — C50512 Malignant neoplasm of lower-outer quadrant of left female breast: Secondary | ICD-10-CM | POA: Diagnosis not present

## 2023-12-21 NOTE — Patient Instructions (Signed)
 [facility] at [facility] **VISIT SUMMARY & IMPORTANT INSTRUCTIONS **   You were seen today by Pleasant Barefoot PA-C for your follow-up visit.    HISTORY OF BREAST CANCER Your most recent labs and physical exam did not show any evidence of recurrent breast cancer. We we will see you for follow-up labs and physical exam in 1 year.  SWELLING The swelling in your arms may be lymphedema related to your previous mastectomy.  We will refer you to the lymphedema clinic here in [place].  They will help you with exercises and devices to decrease the swelling in your arms. The swelling in your legs is more likely related to cardiac function and blood flow.  You can take the Lasix  (furosemide ) that was prescribed by your primary care provider.  You should also discuss the symptoms further with your primary care provider.  FOLLOW-UP APPOINTMENT: 1 year  ** Thank you for trusting me with your healthcare!  I strive to provide all of my patients with quality care at each visit.  If you receive a survey for this visit, I would be so grateful to you for taking the time to provide feedback.  Thank you in advance!  ~ Dimetri Armitage                                        Dr. Mickiel Davonna Pleasant Barefoot, PA-C          Delon Hope, NP   - - - - - - - - - - - - - - - - - -    Thank you for choosing [facility] at [facility] to provide your oncology and hematology care.  To afford each patient quality time with our provider, please arrive at least 15 minutes before your scheduled appointment time.   If you have a lab appointment with the Cancer Center please come in thru the Main Entrance and check in at the main information desk.  You need to re-schedule your appointment should you arrive 10 or more minutes late.  We strive to give you quality time with our providers, and arriving late affects you and other patients whose appointments are after yours.   Also, if you no show three or more times for appointments you may be dismissed from the clinic at the providers discretion.     Again, thank you for choosing [facility].  Our hope is that these requests will decrease the amount of time that you wait before being seen by our physicians.       _____________________________________________________________  Should you have questions after your visit to [facility], please contact our office at [phone] and follow the prompts.  Our office hours are 8:00 a.m. and 4:30 p.m. Monday - Friday.  Please note that voicemails left after 4:00 p.m. may not be returned until the following business day.  We are closed weekends and major holidays.  You do have access to a nurse 24-7, just call the main number to the clinic [phone] and do not press any options, hold on the line and a nurse will answer the phone.    For prescription refill requests, have your pharmacy contact our office and allow 72 hours.

## 2023-12-25 ENCOUNTER — Inpatient Hospital Stay: Payer: Medicare Other | Admitting: Physician Assistant

## 2024-01-01 ENCOUNTER — Encounter: Payer: Self-pay | Admitting: *Deleted

## 2024-01-31 ENCOUNTER — Other Ambulatory Visit: Payer: Self-pay

## 2024-01-31 ENCOUNTER — Telehealth: Payer: Self-pay

## 2024-01-31 MED ORDER — NITROFURANTOIN MONOHYD MACRO 100 MG PO CAPS: 100.0000 mg | ORAL_CAPSULE | Freq: Two times a day (BID) | ORAL | 0 refills | Status: AC

## 2024-01-31 NOTE — Telephone Encounter (Signed)
 Copied from CRM #[id number]. Topic: Clinical - Medical Advice >> Jan 31, 2024 10:33 AM Jasmin G wrote: Reason for CRM: Pt requested for a prescription to be sent due to experiencing a possible bladder infection, pt states that she has been going to the bathroom every 2 hours since last night, no other symtoms.

## 2024-01-31 NOTE — Telephone Encounter (Signed)
Antibiotic sent to her pharmacy.

## 2024-02-01 NOTE — Telephone Encounter (Signed)
 Pt advised with verbal understanding

## 2024-02-04 ENCOUNTER — Other Ambulatory Visit: Payer: Self-pay

## 2024-02-04 DIAGNOSIS — I5020 Unspecified systolic (congestive) heart failure: Secondary | ICD-10-CM

## 2024-02-09 ENCOUNTER — Ambulatory Visit: Payer: Self-pay

## 2024-02-19 ENCOUNTER — Ambulatory Visit: Payer: Self-pay

## 2024-03-20 ENCOUNTER — Inpatient Hospital Stay: Attending: Hematology

## 2024-06-20 ENCOUNTER — Inpatient Hospital Stay: Attending: Hematology

## 2024-09-19 ENCOUNTER — Inpatient Hospital Stay: Attending: Hematology

## 2024-12-11 ENCOUNTER — Inpatient Hospital Stay: Attending: Hematology

## 2024-12-18 ENCOUNTER — Inpatient Hospital Stay: Admitting: Physician Assistant
